# Patient Record
Sex: Female | Born: 1940 | Race: White | Hispanic: No | Smoking: Former smoker
Health system: Southern US, Community
[De-identification: ages and names within clinical notes are randomized; demographics above are authoritative.]

## PROBLEM LIST (undated history)

## (undated) DIAGNOSIS — M1990 Unspecified osteoarthritis, unspecified site: Secondary | ICD-10-CM

## (undated) DIAGNOSIS — S83104A Unspecified dislocation of right knee, initial encounter: Secondary | ICD-10-CM

## (undated) DIAGNOSIS — D649 Anemia, unspecified: Secondary | ICD-10-CM

## (undated) DIAGNOSIS — I609 Nontraumatic subarachnoid hemorrhage, unspecified: Secondary | ICD-10-CM

## (undated) DIAGNOSIS — R519 Headache, unspecified: Secondary | ICD-10-CM

## (undated) DIAGNOSIS — J189 Pneumonia, unspecified organism: Secondary | ICD-10-CM

## (undated) DIAGNOSIS — S83241A Other tear of medial meniscus, current injury, right knee, initial encounter: Secondary | ICD-10-CM

## (undated) DIAGNOSIS — R51 Headache: Secondary | ICD-10-CM

## (undated) DIAGNOSIS — I493 Ventricular premature depolarization: Secondary | ICD-10-CM

## (undated) DIAGNOSIS — I639 Cerebral infarction, unspecified: Secondary | ICD-10-CM

## (undated) DIAGNOSIS — E039 Hypothyroidism, unspecified: Secondary | ICD-10-CM

## (undated) HISTORY — PX: WISDOM TOOTH EXTRACTION: SHX21

## (undated) HISTORY — PX: MULTIPLE TOOTH EXTRACTIONS: SHX2053

## (undated) HISTORY — PX: BACK SURGERY: SHX140

## (undated) HISTORY — PX: SHOULDER SURGERY: SHX246

---

## 1968-11-01 HISTORY — PX: DILATION AND CURETTAGE OF UTERUS: SHX78

## 1968-11-01 HISTORY — PX: TONSILLECTOMY: SUR1361

## 1969-11-01 HISTORY — PX: VAGINAL HYSTERECTOMY: SUR661

## 1984-11-01 HISTORY — PX: CERVICAL LAMINECTOMY: SHX94

## 1991-11-02 HISTORY — PX: LUMBAR LAMINECTOMY: SHX95

## 1991-11-02 HISTORY — PX: ELBOW SURGERY: SHX618

## 1999-07-30 ENCOUNTER — Emergency Department (HOSPITAL_COMMUNITY): Admission: EM | Admit: 1999-07-30 | Discharge: 1999-07-30 | Payer: Self-pay | Admitting: Emergency Medicine

## 1999-07-30 ENCOUNTER — Encounter: Payer: Self-pay | Admitting: Emergency Medicine

## 2008-01-23 ENCOUNTER — Ambulatory Visit: Payer: Self-pay | Admitting: Gastroenterology

## 2008-01-23 LAB — CONVERTED CEMR LAB
ALT: 23 units/L (ref 0–35)
AST: 17 units/L (ref 0–37)
Albumin: 4 g/dL (ref 3.5–5.2)
Alkaline Phosphatase: 47 units/L (ref 39–117)
BUN: 6 mg/dL (ref 6–23)
Basophils Absolute: 0 10*3/uL (ref 0.0–0.1)
Basophils Relative: 0.8 % (ref 0.0–1.0)
Bilirubin, Direct: 0.1 mg/dL (ref 0.0–0.3)
CO2: 33 meq/L — ABNORMAL HIGH (ref 19–32)
Calcium: 9.4 mg/dL (ref 8.4–10.5)
Chloride: 103 meq/L (ref 96–112)
Creatinine, Ser: 0.8 mg/dL (ref 0.4–1.2)
Eosinophils Absolute: 0.1 10*3/uL (ref 0.0–0.6)
Eosinophils Relative: 2.7 % (ref 0.0–5.0)
GFR calc Af Amer: 92 mL/min
GFR calc non Af Amer: 76 mL/min
Glucose, Bld: 98 mg/dL (ref 70–99)
HCT: 41.8 % (ref 36.0–46.0)
Hemoglobin: 13.8 g/dL (ref 12.0–15.0)
Lymphocytes Relative: 31.7 % (ref 12.0–46.0)
MCHC: 32.9 g/dL (ref 30.0–36.0)
MCV: 88.8 fL (ref 78.0–100.0)
Monocytes Absolute: 0.3 10*3/uL (ref 0.2–0.7)
Monocytes Relative: 7.7 % (ref 3.0–11.0)
Neutro Abs: 2.5 10*3/uL (ref 1.4–7.7)
Neutrophils Relative %: 57.1 % (ref 43.0–77.0)
Platelets: 248 10*3/uL (ref 150–400)
Potassium: 4.4 meq/L (ref 3.5–5.1)
RBC: 4.7 M/uL (ref 3.87–5.11)
RDW: 12.7 % (ref 11.5–14.6)
Sed Rate: 15 mm/hr (ref 0–25)
Sodium: 141 meq/L (ref 135–145)
TSH: 2.36 microintl units/mL (ref 0.35–5.50)
Tissue Transglutaminase Ab, IgA: 0.2 units (ref <7)
Total Bilirubin: 0.5 mg/dL (ref 0.3–1.2)
Total Protein: 6.4 g/dL (ref 6.0–8.3)
WBC: 4.3 10*3/uL — ABNORMAL LOW (ref 4.5–10.5)

## 2008-01-24 ENCOUNTER — Encounter: Payer: Self-pay | Admitting: Gastroenterology

## 2008-02-02 ENCOUNTER — Ambulatory Visit: Payer: Self-pay | Admitting: Gastroenterology

## 2008-02-02 ENCOUNTER — Encounter: Payer: Self-pay | Admitting: Gastroenterology

## 2011-03-16 NOTE — Assessment & Plan Note (Signed)
[facility]                         GASTROENTEROLOGY OFFICE NOTE   NAME:Longley, CHRISSI CROW                MRN:          [id number]  DATE:01/23/2008                            DOB:          October 06, 1941    Mrs. 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  She is referred through the courtesy of  Dr. Foy Guadalajara for evaluation of four weeks of crampy abdominal pain and  diarrhea.   Mrs. Kishi has a long history of irritable bowel syndrome, diarrhea  predominant.  She really had been doing fairly well until the last month  when she has had 5 to 6 liters of watery nonbloody bowel movements today  with crampy abdominal pain, urgency, nocturnal diarrhea.  She denies any  recent antibiotic use, travel, or no other new medications, or  infectious disease exposure or sick family members at home.  Her  appetite is good and weight is stable, and she denies any specific food  intolerances.  She has seen Dr. Foy Guadalajara and had a normal CBC and  electrolyte panel, negative H. Pylori antibody titer, and preliminary  stool level and parasite is negative.   Apparently, this patient had colonoscopy in 1995, but I do not have this  report for review.  She currently denies rectal bleeding, fever, chills,  or other systemic complaints, any upper GI or hepatobiliary problems.  She has been using p.r.n. Imodium with mild response, also p.r.n.  dicyclomine 10 mg.   PAST MEDICAL HISTORY:  Otherwise noncontributory, suppurative  degenerative arthritis, and previous hysterectomy.   MEDICATIONS:  Imodium and dicyclomine.  She denies drug allergies.   FAMILY HISTORY:  Noncontributory.   There is no family history of colon cancer and polyps.  The risks of  diabetes and atherosclerosis in her father and brother.   SOCIAL HISTORY:  She is widowed and lives with her mother.  She has a  twelfth grade education.  She smokes one pack  of cigarettes per day but  denies ethanol abuse or dependency.   REVIEW OF SYSTEMS:  Otherwise noncontributory without any current  cardiovascular, pulmonary, genitourinary, neurologic, orthopedic,  endocrine, dermatologic or neuropsychiatric difficulties.  The patient  has had previous hemorrhoidectomy by Dr. Terri Piedra  and also has had a previous hysterectomy.   She is a healthy-appearing white  female in no acute distress.  She appeared her stated age.  She is 5'4 and weighs 162 pounds.  Blood  pressure 120/68.  Pulse was 76 and regular.  I could not appreciate stigmata of chronic liver disease or thyromegaly.  Chest was clear, and she was in regular rhythm without murmurs, gallops  or rubs.  There was no organomegaly, abdominal masses or tenderness.  Bowel sounds  were entirely normal.  Peripheral extremities were unremarkable.  Inspection of the rectum was unremarkable as was rectal exam.  There was  soft stool present, and it was guaiac negative.   ASSESSMENT:  Mrs. Lauf most likely is having a flair of irritable  bowel syndrome for a past history and rather negative examination.  Her  stool ova and parasite is still  pending, and she did not have a stool  culture performed which I will order.  It is unlikely that she has  underlying inflammatory bowel disease, but this is certainly a  possibility.   RECOMMENDATIONS:  1. Stool routine culture and sensitivity.  2. Check repeat CBC, sed rate, and liver profile.  3. Low fiber diet as tolerated.  4. Continue p.r.n. Imodium.  Will have her empirically placed on      Lialda 1.2 g 2 tablets q.a.m.  5. Screening colonoscopy at her convenience.     Vania Rea. Jarold Motto, MD, Caleen Essex, FAGA  Electronically Signed    DRP/MedQ  DD: 01/23/2008  DT: 01/23/2008  Job #: [id number]   cc:   Molly Maduro L. Foy Guadalajara, M.D.  Joycelyn Rua, M.D.

## 2014-08-07 ENCOUNTER — Encounter: Payer: Self-pay | Admitting: Gastroenterology

## 2014-11-01 DIAGNOSIS — I609 Nontraumatic subarachnoid hemorrhage, unspecified: Secondary | ICD-10-CM

## 2014-11-01 HISTORY — DX: Nontraumatic subarachnoid hemorrhage, unspecified: I60.9

## 2015-09-24 ENCOUNTER — Encounter: Payer: Self-pay | Admitting: *Deleted

## 2015-09-24 ENCOUNTER — Emergency Department (INDEPENDENT_AMBULATORY_CARE_PROVIDER_SITE_OTHER)
Admission: EM | Admit: 2015-09-24 | Discharge: 2015-09-24 | Disposition: A | Discharge status: Home / Self Care | Payer: Medicare Other | Source: Home / Self Care | Attending: Family Medicine | Admitting: Family Medicine

## 2015-09-24 ENCOUNTER — Emergency Department (INDEPENDENT_AMBULATORY_CARE_PROVIDER_SITE_OTHER): Payer: PRIVATE HEALTH INSURANCE

## 2015-09-24 DIAGNOSIS — Z23 Encounter for immunization: Secondary | ICD-10-CM

## 2015-09-24 DIAGNOSIS — S00412A Abrasion of left ear, initial encounter: Secondary | ICD-10-CM

## 2015-09-24 DIAGNOSIS — M25472 Effusion, left ankle: Secondary | ICD-10-CM

## 2015-09-24 DIAGNOSIS — S93402A Sprain of unspecified ligament of left ankle, initial encounter: Secondary | ICD-10-CM | POA: Diagnosis not present

## 2015-09-24 HISTORY — DX: Unspecified osteoarthritis, unspecified site: M19.90

## 2015-09-24 IMAGING — CR DG ANKLE COMPLETE 3+V*L*
3 series · 3 of 3 positions shown · non-contrast
Comparison: None.

CLINICAL DATA: Lateral ankle pain and swelling after falling out of
bed this morning, initial encounter.

EXAM:
LEFT ANKLE COMPLETE - 3+ VIEW

[ankle ap]
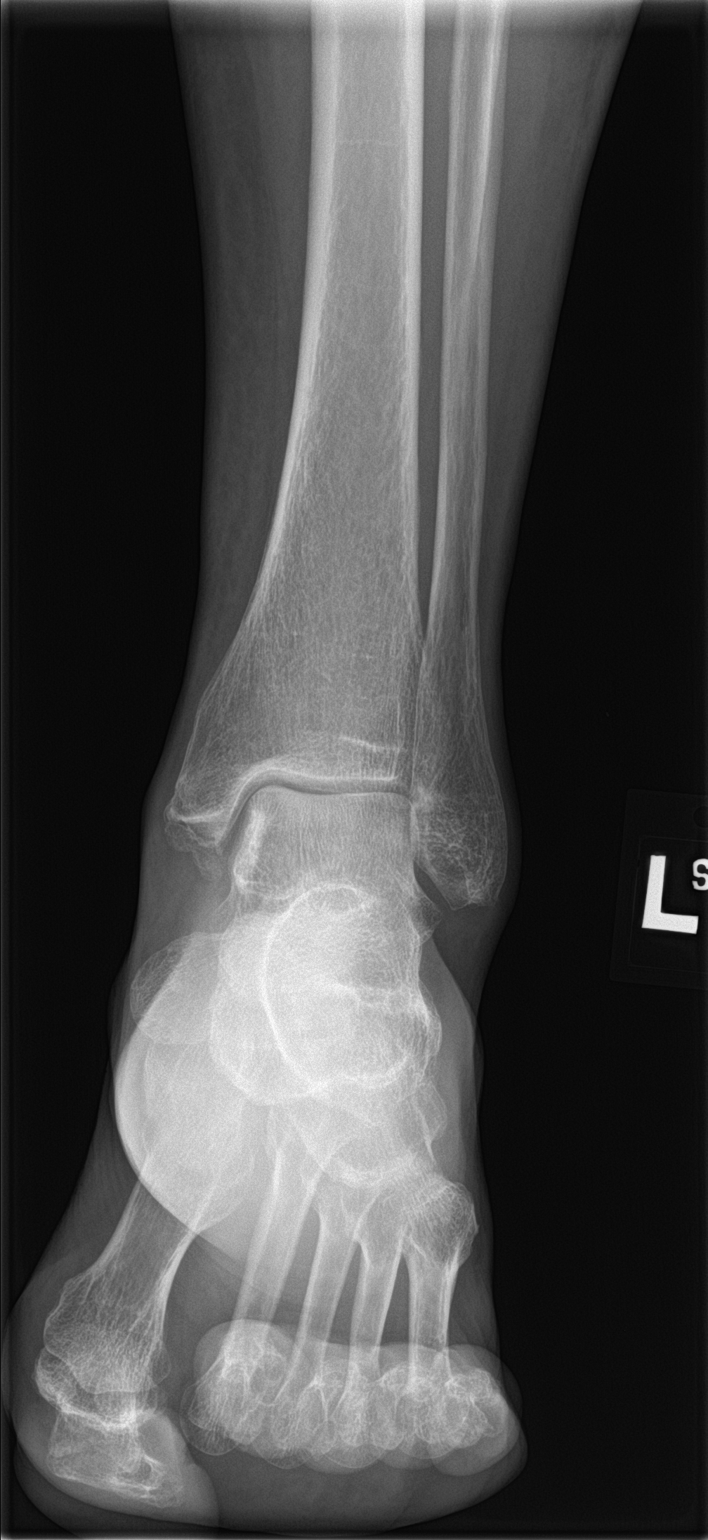

[ankle obl]
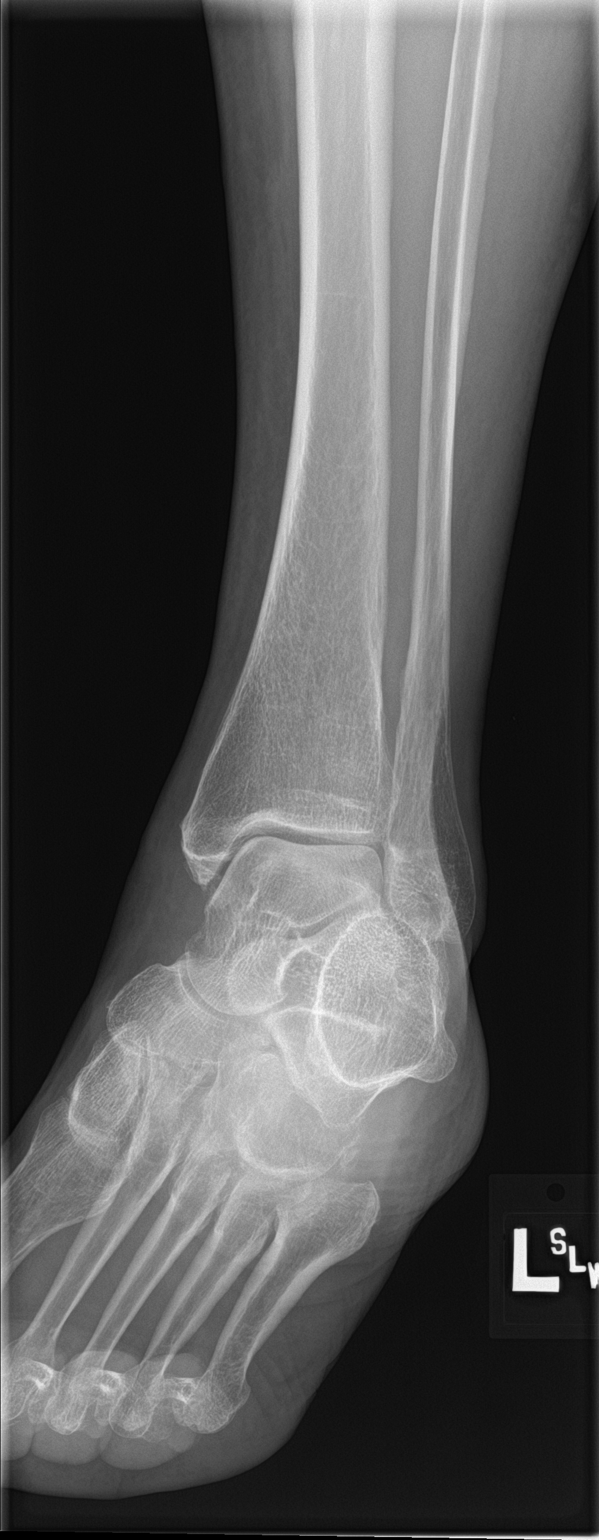

[ankle lat]
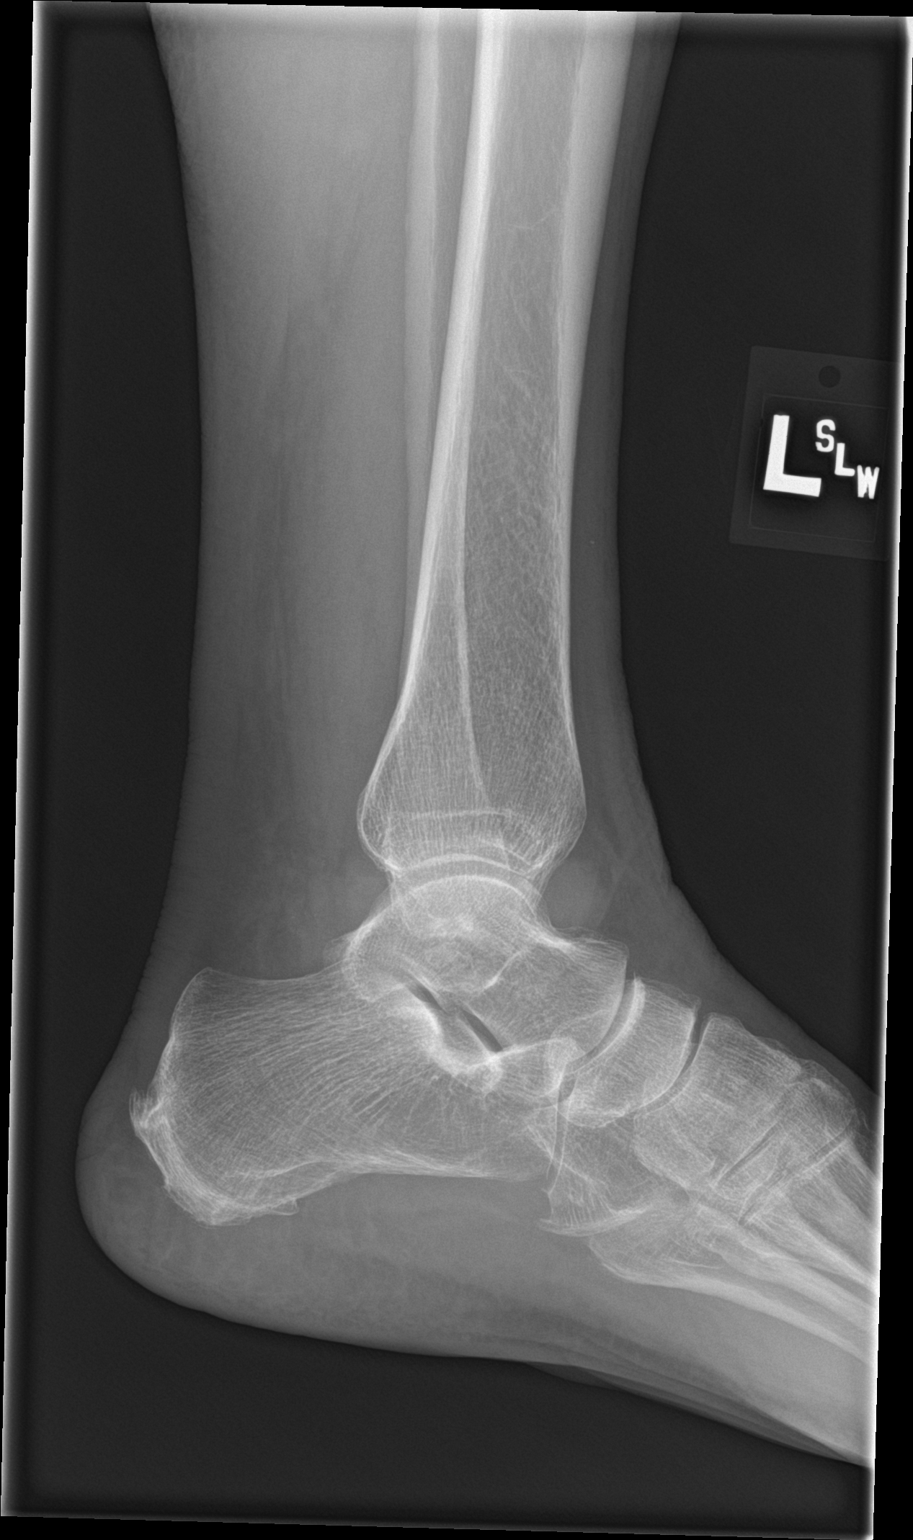

[3 of 3 positions shown; findings below may reference images not displayed]

FINDINGS: There is a joint effusion. No definite acute fracture. Possible old
medial malleolar avulsion fracture. Calcaneal spurs.
IMPRESSION: Joint effusion.  No definite fracture.

## 2015-09-24 MED ORDER — ACETAMINOPHEN 325 MG PO TABS
975.0000 mg | ORAL_TABLET | Freq: Once | ORAL | Status: AC
  Administered 2015-09-24: 975 mg via ORAL

## 2015-09-24 MED ORDER — TETANUS-DIPHTH-ACELL PERTUSSIS 5-2.5-18.5 LF-MCG/0.5 IM SUSP
0.5000 mL | Freq: Once | INTRAMUSCULAR | Status: AC
  Administered 2015-09-24: 0.5 mL via INTRAMUSCULAR

## 2015-09-24 MED ORDER — HYDROCODONE-ACETAMINOPHEN 5-325 MG PO TABS: 1.0000 | ORAL_TABLET | Freq: Four times a day (QID) | ORAL | Status: DC | PRN

## 2015-09-24 MED ORDER — MUPIROCIN 2 % EX OINT: 1.0000 "application " | TOPICAL_OINTMENT | Freq: Three times a day (TID) | CUTANEOUS | Status: DC

## 2015-09-24 NOTE — Discharge Instructions (Signed)
Apply ice pack for 30 minutes every 1 to 2 hours today and tomorrow.  Elevate.  Use walker or cane until evaluated by your orthopedist.  Wear Ace wrap until swelling decreases.  Wear brace for about 2 to 3 weeks.     Return for follow-up if left ear becomes increasingly red, painful, swollen.   Ankle Sprain An ankle sprain is an injury to the strong, fibrous tissues (ligaments) that hold the bones of your ankle joint together.  CAUSES An ankle sprain is usually caused by a fall or by twisting your ankle. Ankle sprains most commonly occur when you step on the outer edge of your foot, and your ankle turns inward. People who participate in sports are more prone to these types of injuries.  SYMPTOMS   Pain in your ankle. The pain may be present at rest or only when you are trying to stand or walk.  Swelling.  Bruising. Bruising may develop immediately or within 1 to 2 days after your injury.  Difficulty standing or walking, particularly when turning corners or changing directions. DIAGNOSIS  Your caregiver will ask you details about your injury and perform a physical exam of your ankle to determine if you have an ankle sprain. During the physical exam, your caregiver will press on and apply pressure to specific areas of your foot and ankle. Your caregiver will try to move your ankle in certain ways. An X-ray exam may be done to be sure a bone was not broken or a ligament did not separate from one of the bones in your ankle (avulsion fracture).  TREATMENT  Certain types of braces can help stabilize your ankle. Your caregiver can make a recommendation for this. Your caregiver may recommend the use of medicine for pain. If your sprain is severe, your caregiver may refer you to a surgeon who helps to restore function to parts of your skeletal system (orthopedist) or a physical therapist. HOME CARE INSTRUCTIONS   Apply ice to your injury for 1-2 days or as directed by your caregiver. Applying ice  helps to reduce inflammation and pain.  Put ice in a plastic bag.  Place a towel between your skin and the bag.  Leave the ice on for 15-20 minutes at a time, every 2 hours while you are awake.  Only take over-the-counter or prescription medicines for pain, discomfort, or fever as directed by your caregiver.  Elevate your injured ankle above the level of your heart as much as possible for 2-3 days.  If your caregiver recommends crutches, use them as instructed. Gradually put weight on the affected ankle. Continue to use crutches or a cane until you can walk without feeling pain in your ankle.  If you have a plaster splint, wear the splint as directed by your caregiver. Do not rest it on anything harder than a pillow for the first 24 hours. Do not put weight on it. Do not get it wet. You may take it off to take a shower or bath.  You may have been given an elastic bandage to wear around your ankle to provide support. If the elastic bandage is too tight (you have numbness or tingling in your foot or your foot becomes cold and blue), adjust the bandage to make it comfortable.  If you have an air splint, you may blow more air into it or let air out to make it more comfortable. You may take your splint off at night and before taking a shower or bath.  Wiggle your toes in the splint several times per day to decrease swelling. SEEK MEDICAL CARE IF:   You have rapidly increasing bruising or swelling.  Your toes feel extremely cold or you lose feeling in your foot.  Your pain is not relieved with medicine. SEEK IMMEDIATE MEDICAL CARE IF:  Your toes are numb or blue.  You have severe pain that is increasing. MAKE SURE YOU:   Understand these instructions.  Will watch your condition.  Will get help right away if you are not doing well or get worse.   This information is not intended to replace advice given to you by your health care provider. Make sure you discuss any questions you have  with your health care provider.   Document Released: 10/18/2005 Document Revised: 11/08/2014 Document Reviewed: 10/30/2011 Elsevier Interactive Patient Education [employer]2016 [employer] Inc.

## 2015-09-24 NOTE — ED Provider Notes (Signed)
CSN: [id number]     Arrival date & time 09/24/15  0930 History   First MD Initiated Contact with Patient 09/24/15 1038     Chief Complaint  Patient presents with  . Ear Laceration  . Ankle Pain      HPI Comments: Patient reports that she fell out of bed at 8am today, injuring her left ear and left ankle.  No loss of consciousness.  No other injury.  She does not remember her last Tdap.  The history is provided by the patient.    Past Medical History  Diagnosis Date  . Arthritis    Past Surgical History  Procedure Laterality Date  . Tonsillectomy    . Abdominal hysterectomy    . Dilation and curettage of uterus    . Wisdom tooth extraction    . Cervical laminectomy    . Lumbar laminectomy    . Back surgery    . Elbow surgery Right   . Shoulder surgery Left    Family History  Problem Relation Age of Onset  . Stroke Mother   . Heart attack Father    Social History  Substance Use Topics  . Smoking status: Current Every Day Smoker -- 1.00 packs/day    Types: Cigarettes  . Smokeless tobacco: None  . Alcohol Use: No   OB History    No data available     Review of Systems  Constitutional: Negative.   HENT: Positive for ear pain. Negative for facial swelling, hearing loss, nosebleeds and trouble swallowing.        Laceration/abrasion left ear  Eyes: Negative.   Respiratory: Negative.   Cardiovascular: Negative.   Gastrointestinal: Negative.   Genitourinary: Negative.   Musculoskeletal: Positive for joint swelling.  Skin: Negative.   Neurological: Negative for dizziness, syncope, speech difficulty, weakness, light-headedness, numbness and headaches.    Allergies  Aspirin and Penicillins  Home Medications   Prior to Admission medications   Medication Sig Start Date End Date Taking? Authorizing Provider  acetaminophen (TYLENOL) 325 MG tablet Take 650 mg by mouth as needed.   Yes Historical Provider, MD  diphenhydrAMINE (BENADRYL) 25 MG tablet Take 25 mg by  mouth every 6 (six) hours as needed.   Yes Historical Provider, MD  Fish Oil-Cholecalciferol (FISH OIL + D3 PO) Take by mouth.   Yes Historical Provider, MD  glucosamine-chondroitin 500-400 MG tablet Take 1 tablet by mouth 3 (three) times daily.   Yes Historical Provider, MD  VITAMIN D, ERGOCALCIFEROL, PO Take by mouth.   Yes Historical Provider, MD  HYDROcodone-acetaminophen (NORCO/VICODIN) 5-325 MG tablet Take 1 tablet by mouth every 6 (six) hours as needed for moderate pain. 09/24/15   Lattie Haw, MD  mupirocin ointment (BACTROBAN) 2 % Apply 1 application topically 3 (three) times daily. 09/24/15   Lattie Haw, MD   Meds Ordered and Administered this Visit   Medications  Tdap (BOOSTRIX) injection 0.5 mL (not administered)  acetaminophen (TYLENOL) tablet 975 mg (975 mg Oral Given 09/24/15 0928)    BP 189/75 mmHg  Pulse 63  Temp(Src) 97.8 F (36.6 C) (Oral)  Resp 16  Ht  (1.626 m)  Wt 168 lb (76.204 kg)  BMI 28.82 kg/m2  SpO2 97% No data found.   Physical Exam  Constitutional: She is oriented to person, place, and time. She appears well-developed and well-nourished. No distress.  HENT:  Right Ear: Tympanic membrane, external ear and ear canal normal.  Left Ear: Tympanic membrane and ear canal  normal. Left ear exhibits lacerations.  Ears:  Nose: Nose normal.  Mouth/Throat: Oropharynx is clear and moist.  Superior aspect of left helix has a 3mm long minimal superficial linear abrasion as noted on diagram.  No swelling; minimal tenderness to palpation.    Eyes: Conjunctivae and EOM are normal. Pupils are equal, round, and reactive to light.  Neck: Normal range of motion.  Musculoskeletal:       Left ankle: She exhibits decreased range of motion and swelling. She exhibits no ecchymosis, no deformity, no laceration and normal pulse. Tenderness. Lateral malleolus and AITFL tenderness found. No medial malleolus tenderness found.       Feet:  Left ankle:  Decreased  range of motion.  Tenderness and swelling over the lateral malleolus.  Joint stable.  No tenderness over the base of the fifth metatarsal.  Distal neurovascular function is intact.   Neurological: She is alert and oriented to person, place, and time.  Skin: Skin is warm and dry.  Nursing note and vitals reviewed.   ED Course  Procedures  None  Imaging Review Dg Ankle Complete Left  09/24/2015  CLINICAL DATA:  Lateral ankle pain and swelling after falling out of bed this morning, initial encounter. EXAM: LEFT ANKLE COMPLETE - 3+ VIEW COMPARISON:  None. FINDINGS: There is a joint effusion. No definite acute fracture. Possible old medial malleolar avulsion fracture. Calcaneal spurs. IMPRESSION: Joint effusion.  No definite fracture. Electronically Signed   By: Leanna BattlesMelinda  Blietz M.D.   On: 09/24/2015 10:17     MDM   1. Abrasion of ear, left, initial encounter   2. Left ankle sprain, initial encounter    Tdap administered Small abrasion left external ear cleansed with HibiClens and saline.  Bacitracin ointment applied. Rx for Bactroban ointment TID until healed. With ankle joint effusion present, concern for occult fracture.  Prefer that patient be non-weight bearing but she states that she cannot use crutches.  She does have a walker and cane available at home. Ace wrap applied followed by AirCast splint. Rx written for Lortab. Apply ice pack for 30 minutes every 1 to 2 hours today and tomorrow.  Elevate.    Advised to follow-up with her orthopedist Dr. Renae FicklePaul within one week, and use walker until her follow-up.  Return for follow-up if left ear becomes increasingly red, painful, swollen.    Lattie HawStephen A Beese, MD 09/24/15 [id number]

## 2015-09-24 NOTE — ED Notes (Signed)
Pt c/o LT ear laceration and LT ankle pain post fall while getting out of bed at 0800. Denies LOC.

## 2016-04-06 ENCOUNTER — Encounter: Payer: Self-pay | Admitting: Internal Medicine

## 2016-04-06 NOTE — Progress Notes (Signed)
This encounter was created in error - please disregard.

## 2017-03-28 ENCOUNTER — Encounter (HOSPITAL_BASED_OUTPATIENT_CLINIC_OR_DEPARTMENT_OTHER): Payer: Self-pay

## 2017-03-28 ENCOUNTER — Emergency Department (HOSPITAL_BASED_OUTPATIENT_CLINIC_OR_DEPARTMENT_OTHER): Payer: Medicare Other

## 2017-03-28 ENCOUNTER — Observation Stay (HOSPITAL_BASED_OUTPATIENT_CLINIC_OR_DEPARTMENT_OTHER)
Admission: EM | Admit: 2017-03-28 | Discharge: 2017-03-29 | Disposition: A | Discharge status: Home / Self Care | Payer: Medicare Other | Attending: Neurosurgery | Admitting: Neurosurgery

## 2017-03-28 DIAGNOSIS — S40012A Contusion of left shoulder, initial encounter: Secondary | ICD-10-CM

## 2017-03-28 DIAGNOSIS — M8580 Other specified disorders of bone density and structure, unspecified site: Secondary | ICD-10-CM | POA: Diagnosis not present

## 2017-03-28 DIAGNOSIS — M1990 Unspecified osteoarthritis, unspecified site: Secondary | ICD-10-CM | POA: Diagnosis not present

## 2017-03-28 DIAGNOSIS — F17210 Nicotine dependence, cigarettes, uncomplicated: Secondary | ICD-10-CM | POA: Diagnosis not present

## 2017-03-28 DIAGNOSIS — Y9389 Activity, other specified: Secondary | ICD-10-CM | POA: Diagnosis not present

## 2017-03-28 DIAGNOSIS — M25512 Pain in left shoulder: Secondary | ICD-10-CM | POA: Diagnosis not present

## 2017-03-28 DIAGNOSIS — G3189 Other specified degenerative diseases of nervous system: Secondary | ICD-10-CM | POA: Diagnosis not present

## 2017-03-28 DIAGNOSIS — S0990XA Unspecified injury of head, initial encounter: Secondary | ICD-10-CM

## 2017-03-28 DIAGNOSIS — Z880 Allergy status to penicillin: Secondary | ICD-10-CM | POA: Diagnosis not present

## 2017-03-28 DIAGNOSIS — Y998 Other external cause status: Secondary | ICD-10-CM | POA: Insufficient documentation

## 2017-03-28 DIAGNOSIS — W108XXA Fall (on) (from) other stairs and steps, initial encounter: Secondary | ICD-10-CM | POA: Diagnosis not present

## 2017-03-28 DIAGNOSIS — Y92007 Garden or yard of unspecified non-institutional (private) residence as the place of occurrence of the external cause: Secondary | ICD-10-CM | POA: Insufficient documentation

## 2017-03-28 DIAGNOSIS — M542 Cervicalgia: Secondary | ICD-10-CM | POA: Insufficient documentation

## 2017-03-28 DIAGNOSIS — E876 Hypokalemia: Secondary | ICD-10-CM | POA: Diagnosis not present

## 2017-03-28 DIAGNOSIS — Z79899 Other long term (current) drug therapy: Secondary | ICD-10-CM | POA: Diagnosis not present

## 2017-03-28 DIAGNOSIS — I609 Nontraumatic subarachnoid hemorrhage, unspecified: Secondary | ICD-10-CM

## 2017-03-28 DIAGNOSIS — S066X0A Traumatic subarachnoid hemorrhage without loss of consciousness, initial encounter: Principal | ICD-10-CM | POA: Insufficient documentation

## 2017-03-28 DIAGNOSIS — Z90710 Acquired absence of both cervix and uterus: Secondary | ICD-10-CM | POA: Diagnosis not present

## 2017-03-28 DIAGNOSIS — Z886 Allergy status to analgesic agent status: Secondary | ICD-10-CM | POA: Insufficient documentation

## 2017-03-28 DIAGNOSIS — W19XXXA Unspecified fall, initial encounter: Secondary | ICD-10-CM

## 2017-03-28 IMAGING — CT CT CERVICAL SPINE W/O CM
5 of 8 series · 15 of 33 positions shown, 16 images · non-contrast
Comparison: None.

CLINICAL DATA: 26-year-old female with fall

EXAM:
CT HEAD WITHOUT CONTRAST
CT CERVICAL SPINE WITHOUT CONTRAST
TECHNIQUE: Multidetector CT imaging of the head and cervical spine was
performed following the standard protocol without intravenous
contrast. Multiplanar CT image reconstructions of the cervical spine
were also generated.

[Series 3: head 2.0 h70h · axial · 0.42mm/px · z∈[-372,-322]mm · 2 of 75 slices shown]
[im 25/75  bone]
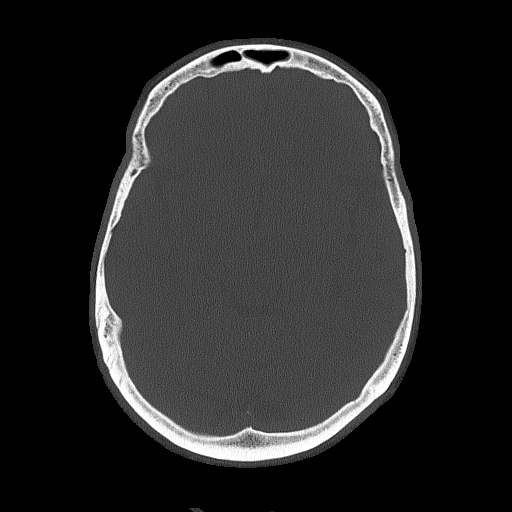
[im 50/75  bone]
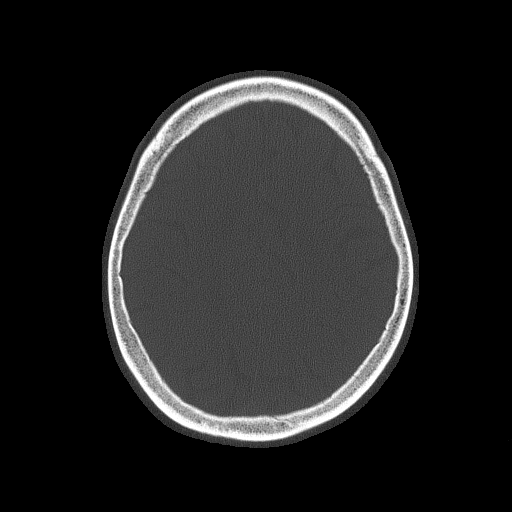

[Series 4: head 3.0 mpr cor · coronal · 0.29mm/px · 3 of 67 slices shown]
[im 17/67  bone]
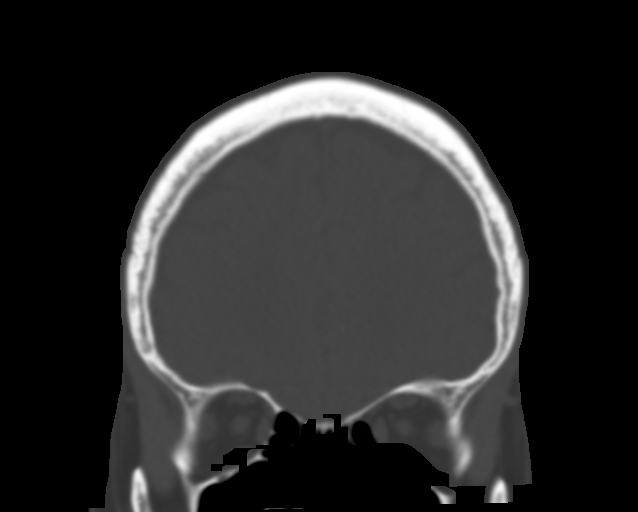
[im 34/67  bone]
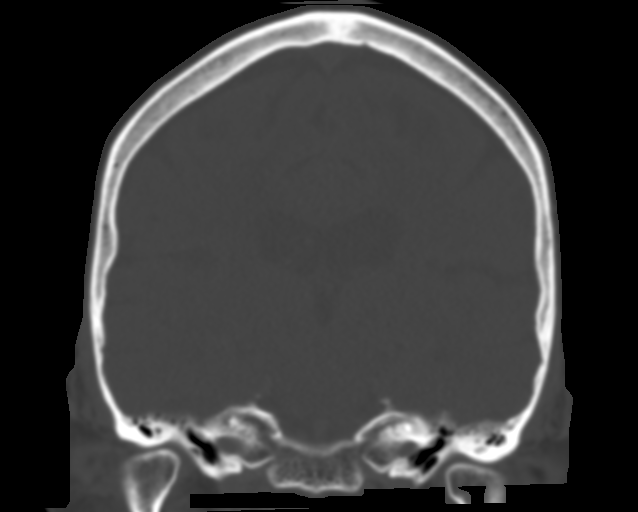
[im 50/67  bone]
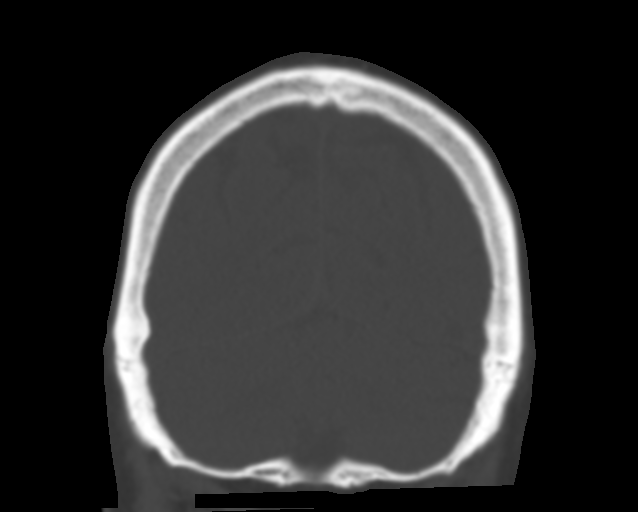

[Series 7: c_spine 2.0 i30s 3 · axial · 0.37mm/px · z∈[-502,-450]mm · 2 of 80 slices shown]
[im 27/80  bone]
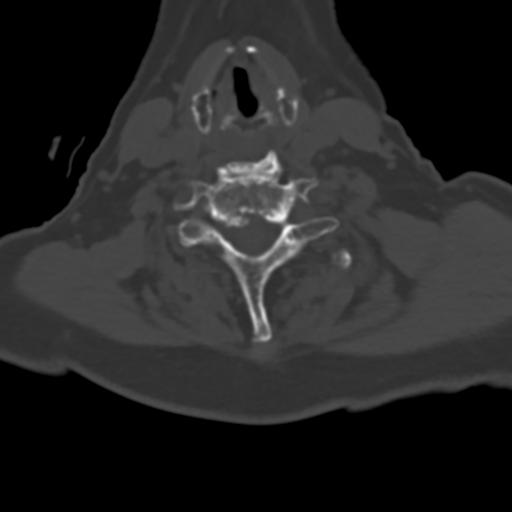
[im 53/80  bone]
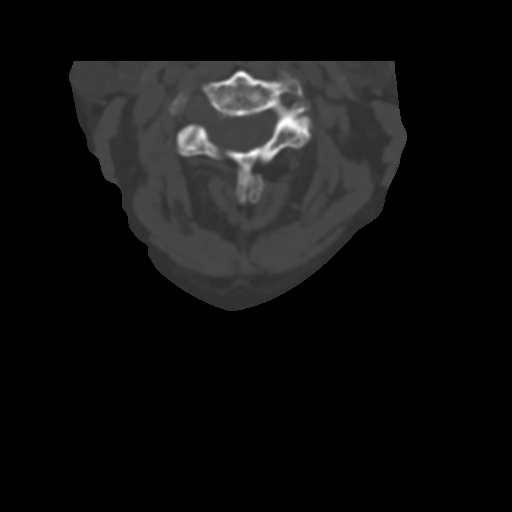

[Series 10: sagittals · sagittal · 0.31mm/px · 5 of 66 slices shown]
[im 11/66  bone]
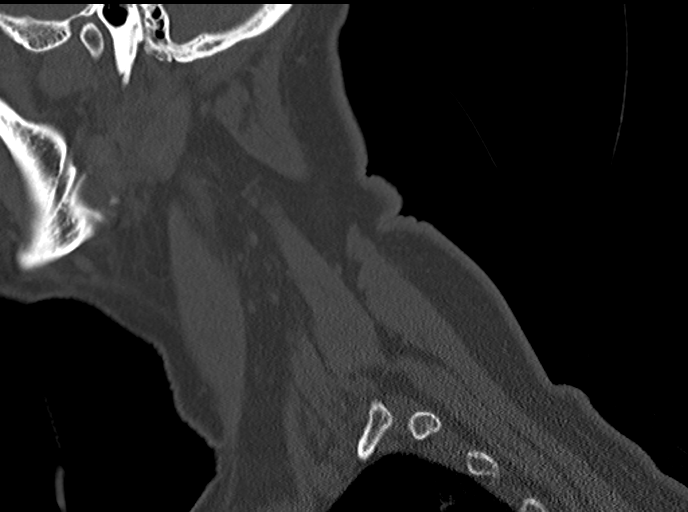
[im 22/66  bone]
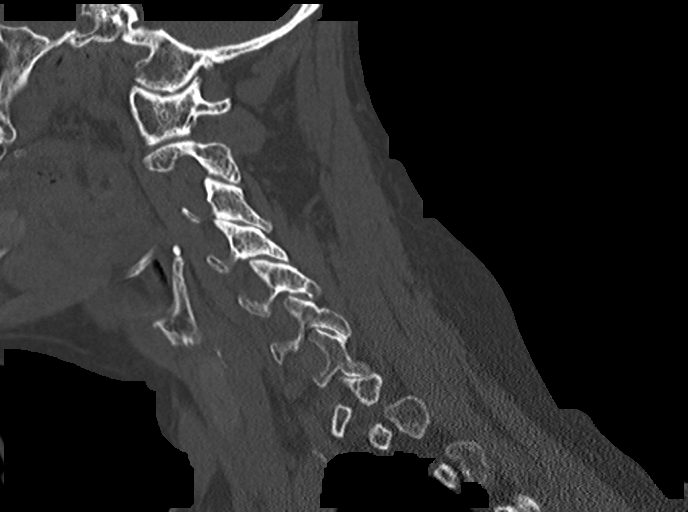
[im 33/66  bone]
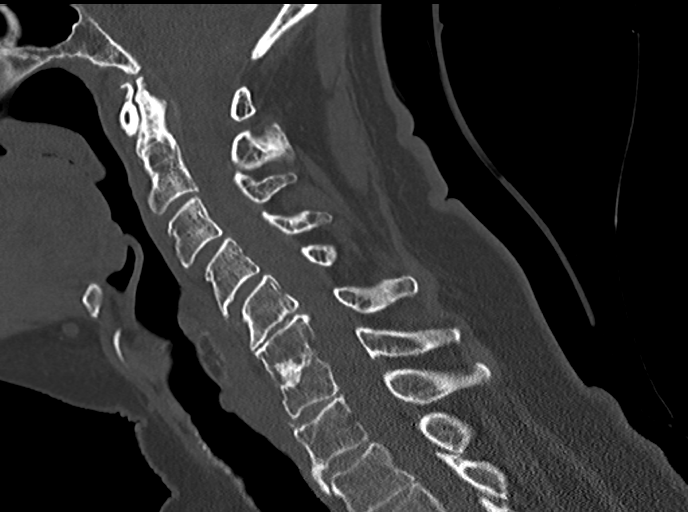
[im 44/66  bone]
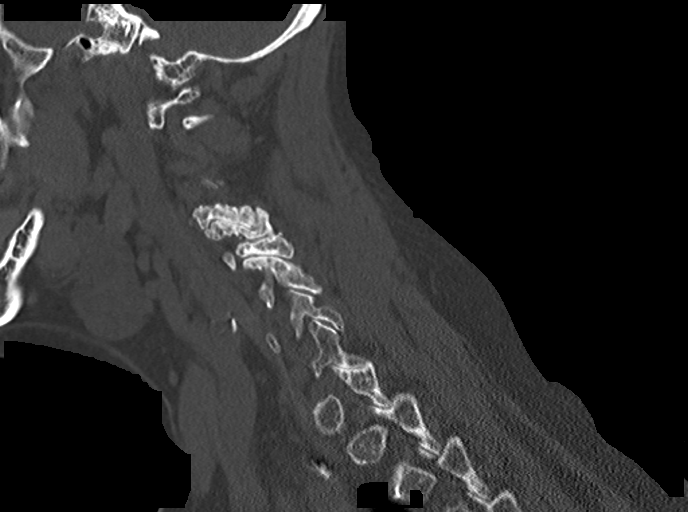
[im 55/66  bone]
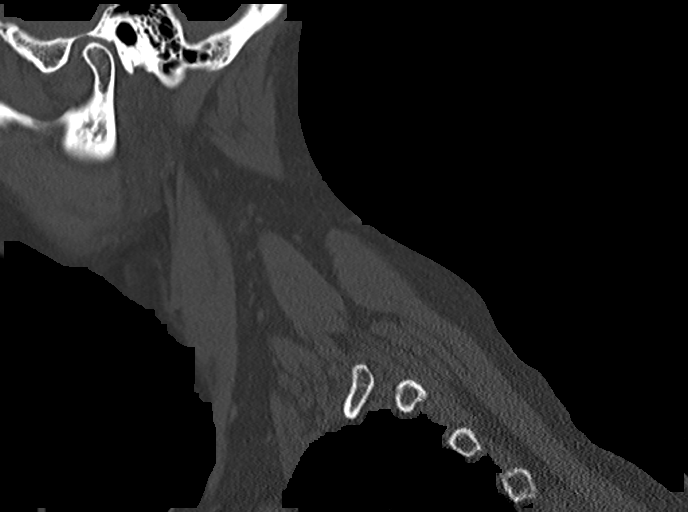

[Series 11: orthogonals · axial · 0.23mm/px · z∈[-544,-445]mm · 3 of 108 slices shown, 4 images]
[im 27/108  soft-tissue]
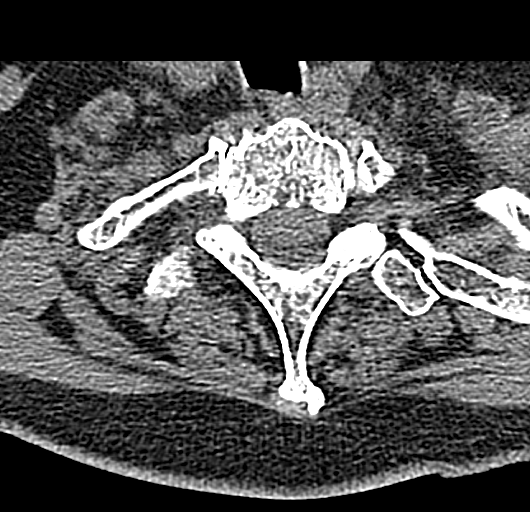
[im 27/108  bone]
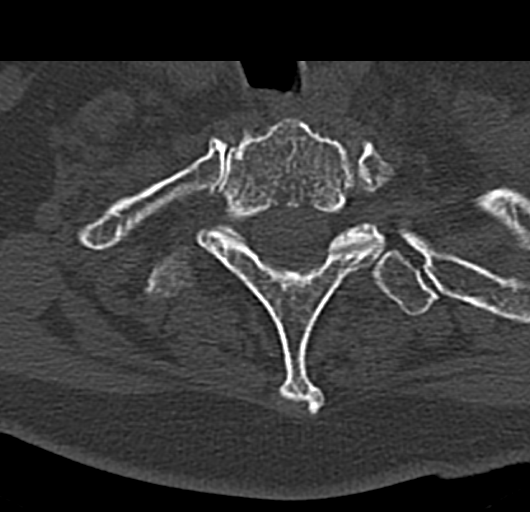
[im 54/108  bone]
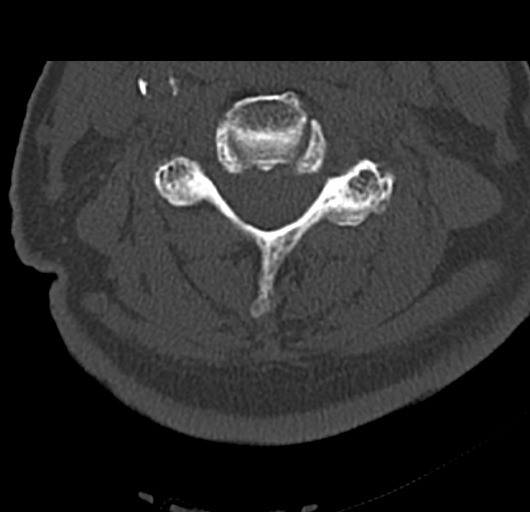
[im 81/108  bone]
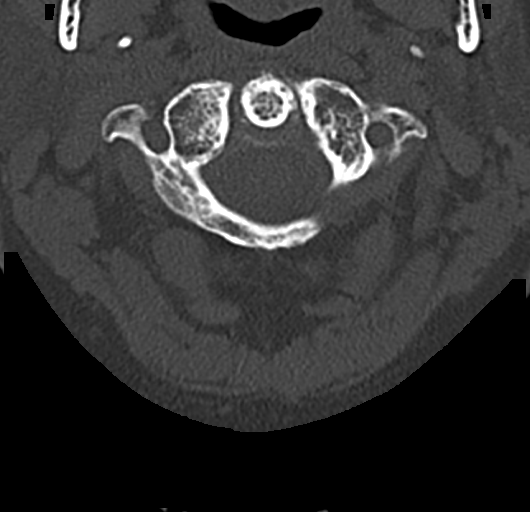

[15 of 33 positions shown; findings below may reference images not displayed]

FINDINGS: CT HEAD FINDINGS

Brain: There is moderate age-related atrophy and chronic
microvascular ischemic changes. A thin linear high attenuation
involving the right frontal lobe along the falx (series 2 image 20,
series 4, image 23, and series 5 image 26 is concerning for a small
subarachnoid hemorrhage. Close follow-up recommended. No other
intracranial hemorrhage identified. There is no mass effect or
midline shift.

Vascular: No hyperdense vessel or unexpected calcification.

Skull: Normal. Negative for fracture or focal lesion.

Sinuses/Orbits: Mild mucoperiosteal thickening of paranasal sinuses.
No air-fluid levels. The mastoid air cells are clear.

Other: None

CT CERVICAL SPINE FINDINGS

Alignment: No acute subluxation. Mild reversal of normal cervical
lordosis at C5-C6, likely related to degenerative changes.

Skull base and vertebrae: No acute fracture.  Osteopenia.

Soft tissues and spinal canal: No prevertebral fluid or swelling. No
visible canal hematoma.

Disc levels: Multilevel degenerative changes most prominent at C5-C6
and C6-C7 where there is disc space narrowing and endplate
irregularity.

Upper chest: Negative.

Other: Bilateral carotid bulb atherosclerotic plaques.
IMPRESSION: 1. Findings suspicious for a very small subarachnoid hemorrhage
involving the medial right frontal lobe along the falx. Close
follow-up recommended.
2. Moderate age-related atrophy and chronic microvascular ischemic
changes.
3. No acute/traumatic cervical spine pathology.
These results were called by telephone at the time of interpretation
on [DATE] at [DATE] to Dr. HAMELJI , who verbally
acknowledged these results.

## 2017-03-28 IMAGING — DX DG SHOULDER 2+V*L*
3 series · 3 of 3 positions shown · non-contrast
Comparison: None.

CLINICAL DATA: Fell 2 hours ago, pain.

EXAM:
LEFT SHOULDER - 2+ VIEW

[shoulder grashey]
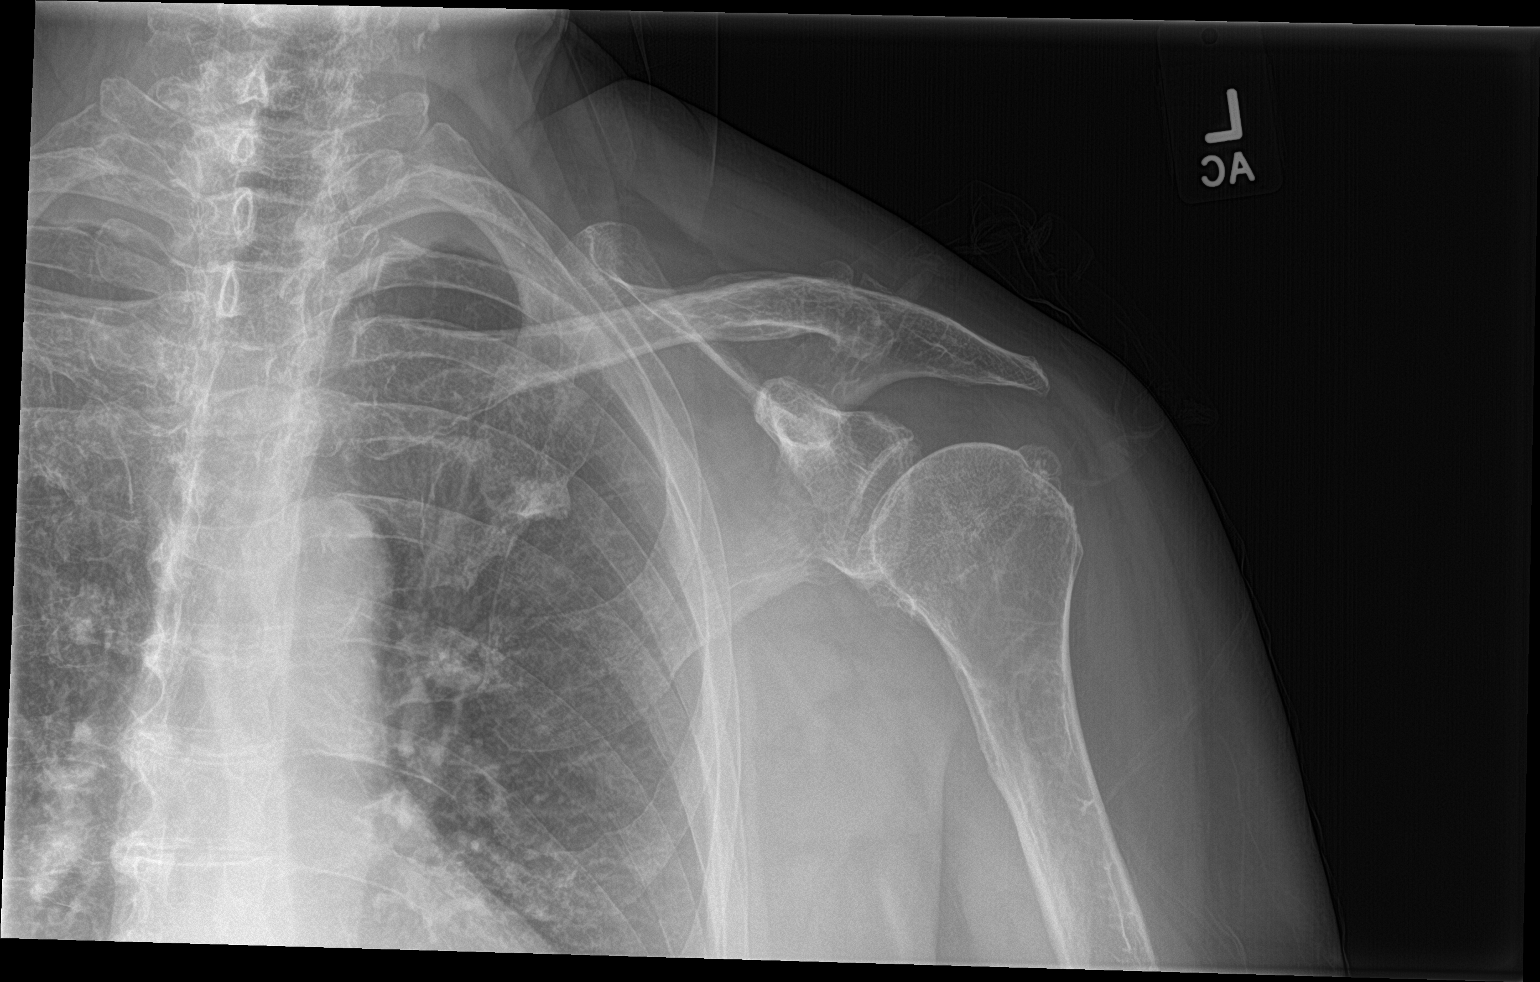

[shoulder y view]
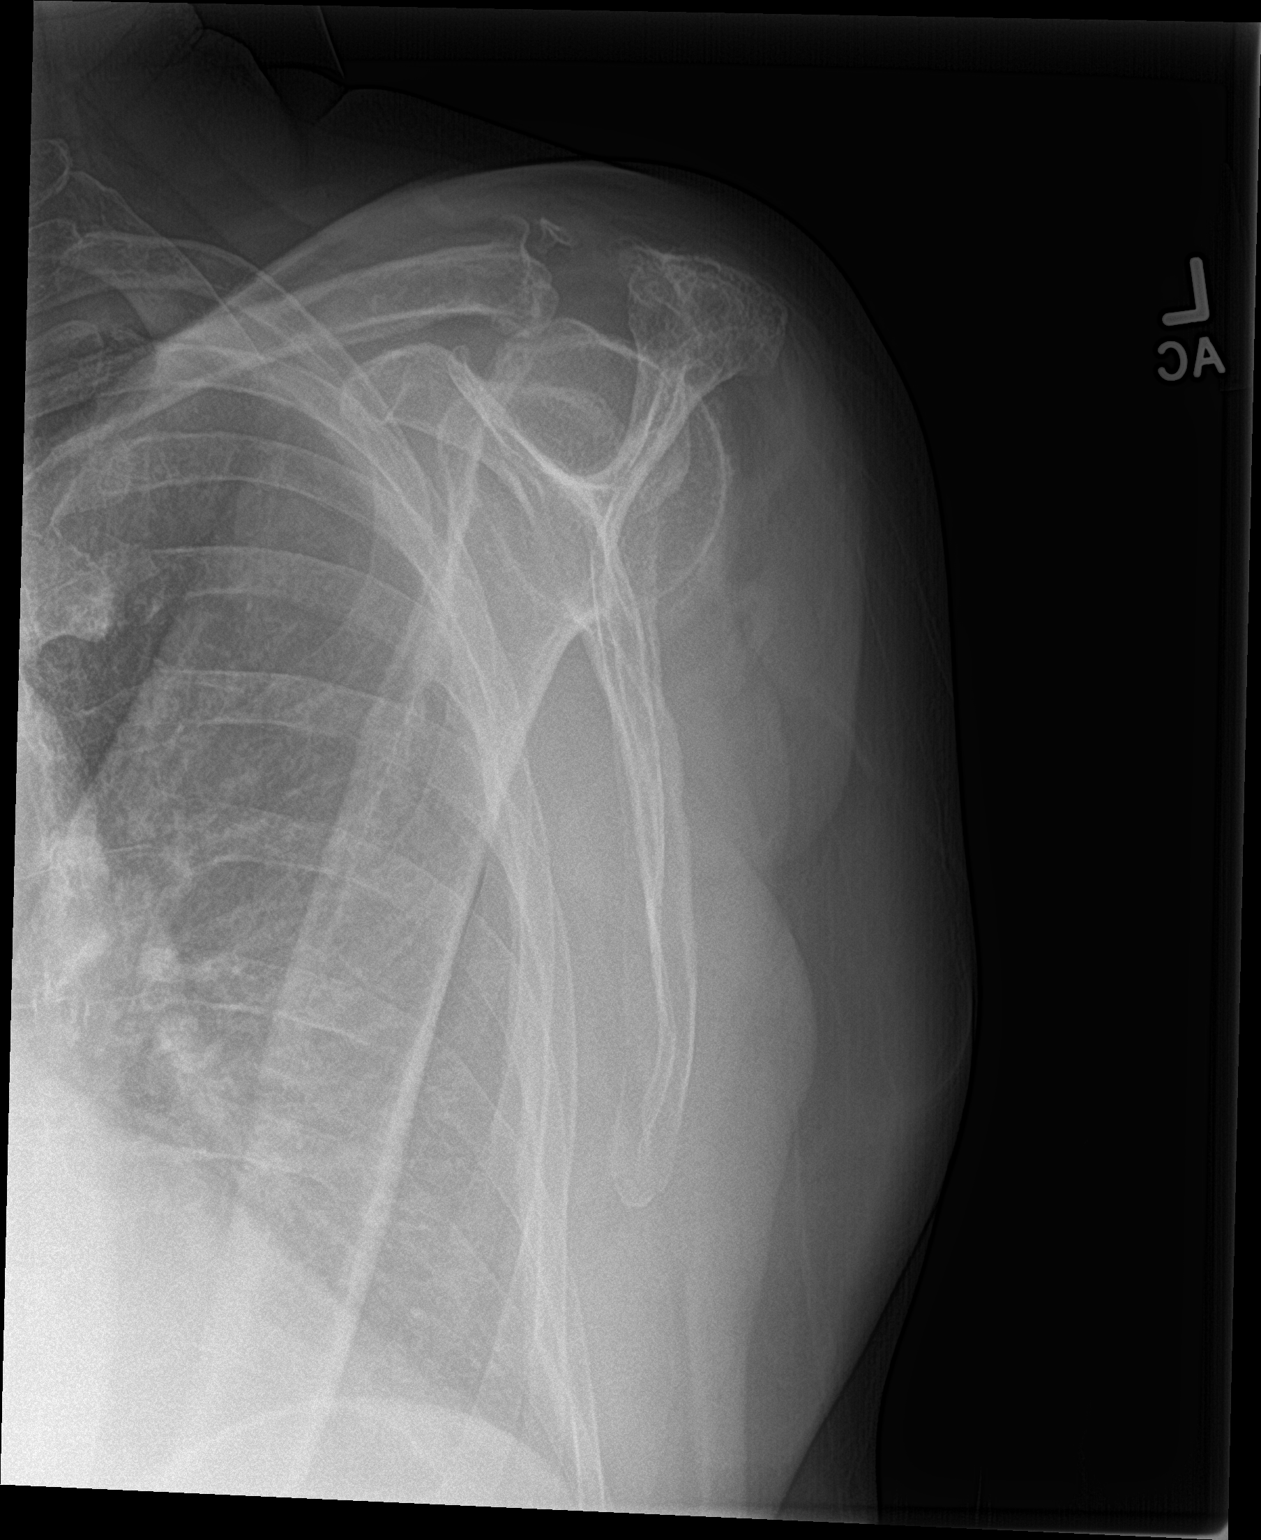

[shoulder axillary]
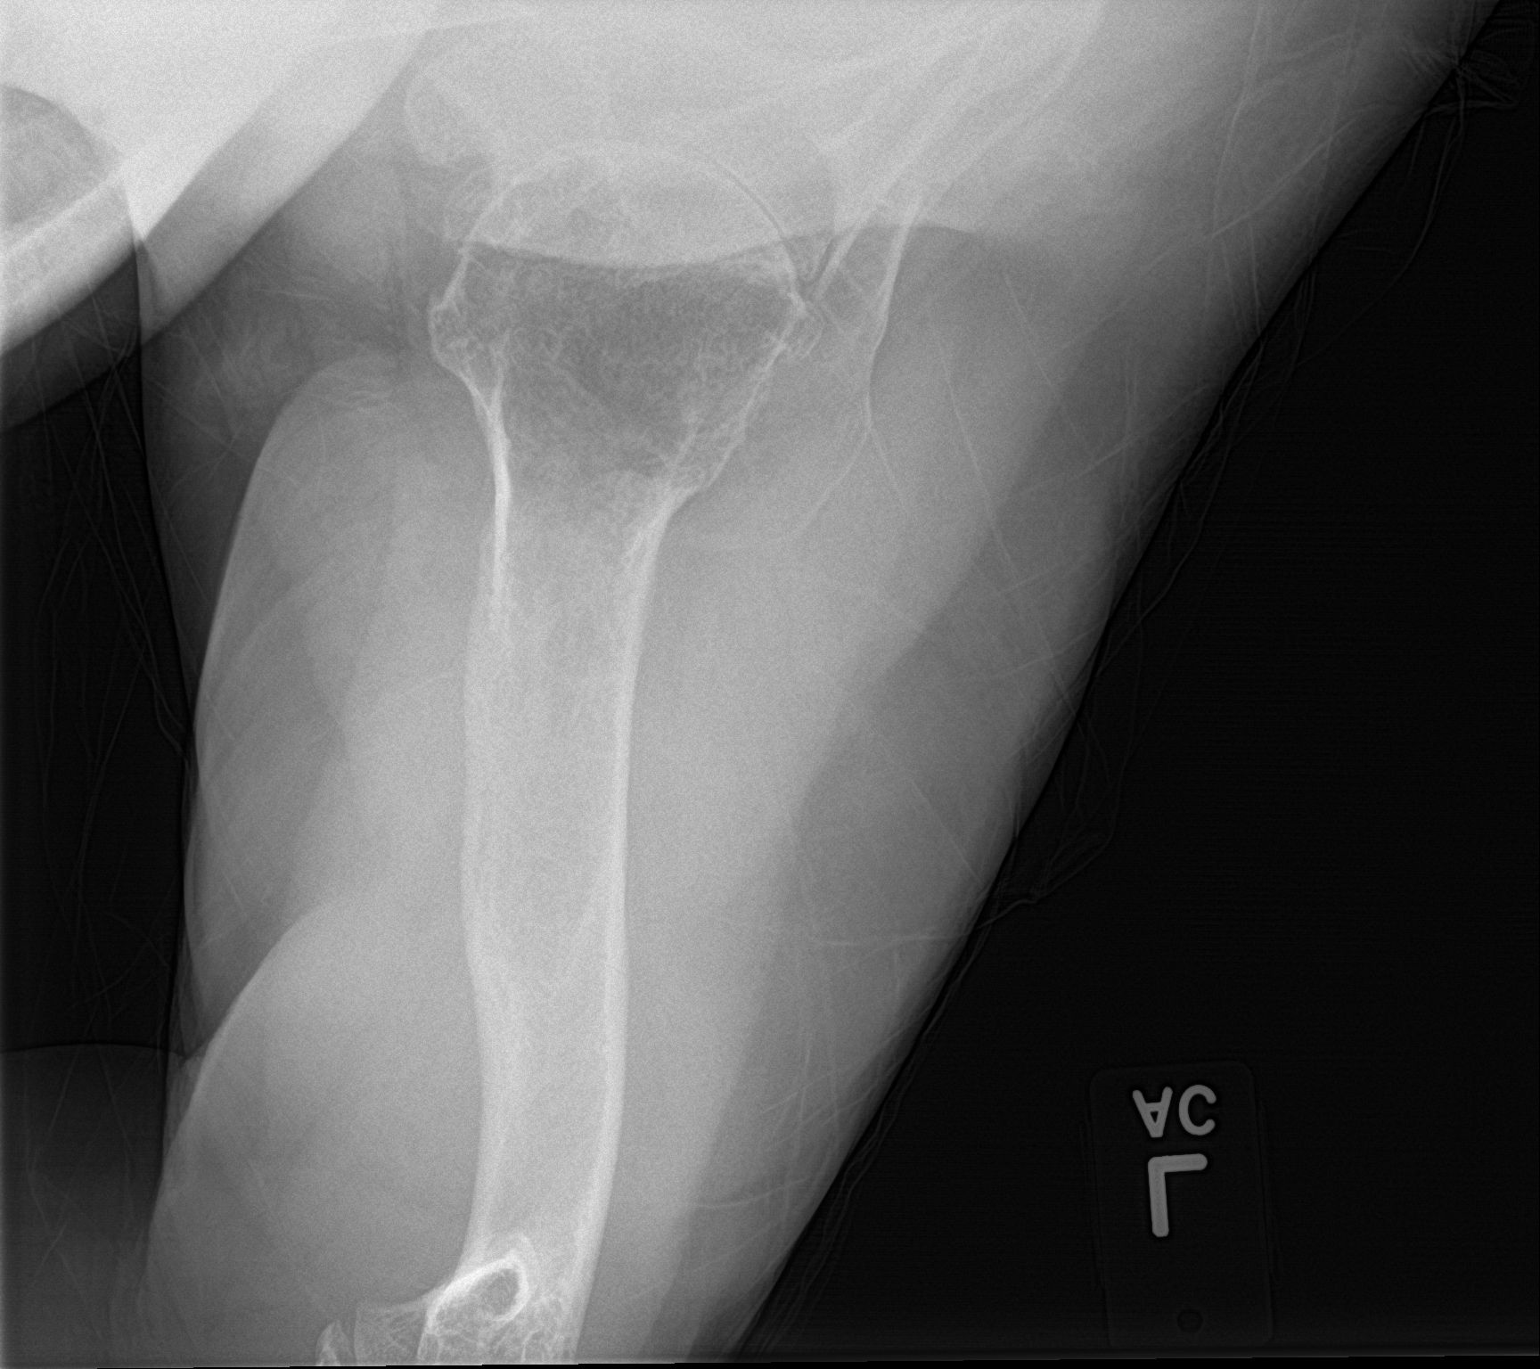

[3 of 3 positions shown; findings below may reference images not displayed]

FINDINGS: The humeral head is well-formed and located. Osteopenia. The
subacromial, glenohumeral joint spaces are intact. Widened
acromioclavicular joint space on Y-view. No destructive bony
lesions. Soft tissue planes are non-suspicious.
IMPRESSION: No acute fracture deformity or dislocation.

Widened AC joint space seen on single view, recommend correlation
with point tenderness.

Osteopenia, decreasing sensitivity for acute nondisplaced fractures.

## 2017-03-28 IMAGING — DX DG CERVICAL SPINE COMPLETE 4+V
6 series · 6 of 6 positions shown · non-contrast
Comparison: None.

CLINICAL DATA: Fell off ladder 2 hours ago, LEFT neck and shoulder
pain. History of 2 prior cervical spine surgeries.

EXAM:
CERVICAL SPINE - COMPLETE 4+ VIEW

[c-spine lat]
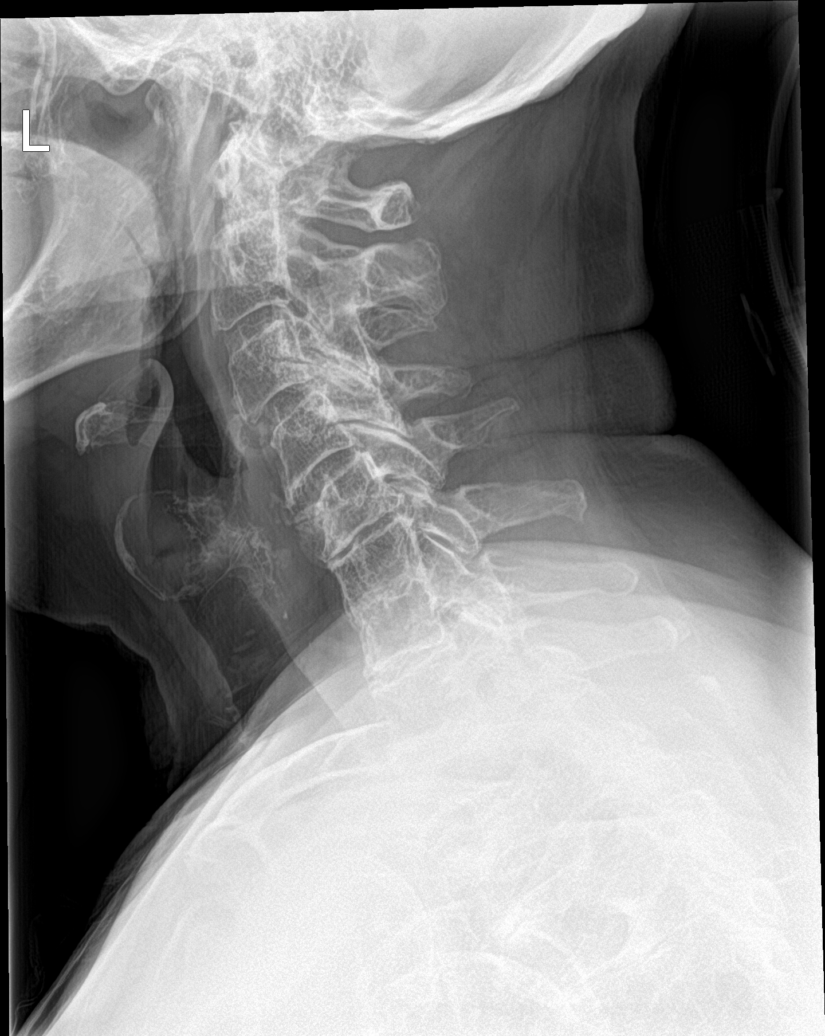

[c-spine obl (1 of 2)]
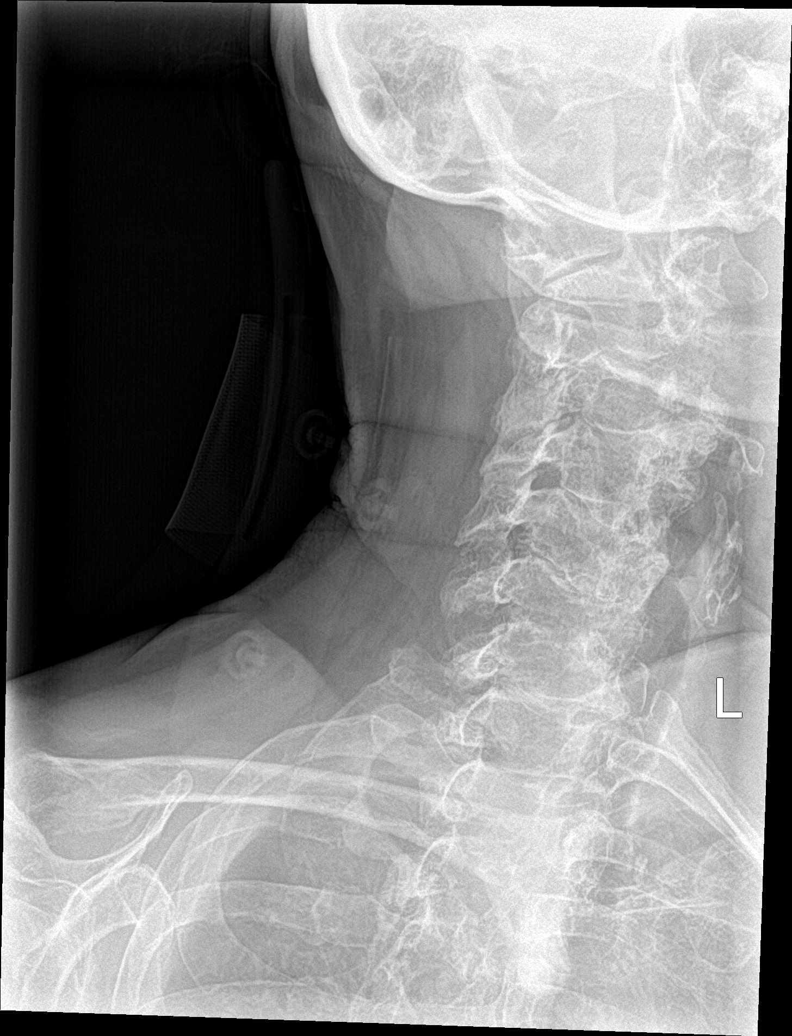

[c-spine obl (2 of 2)]
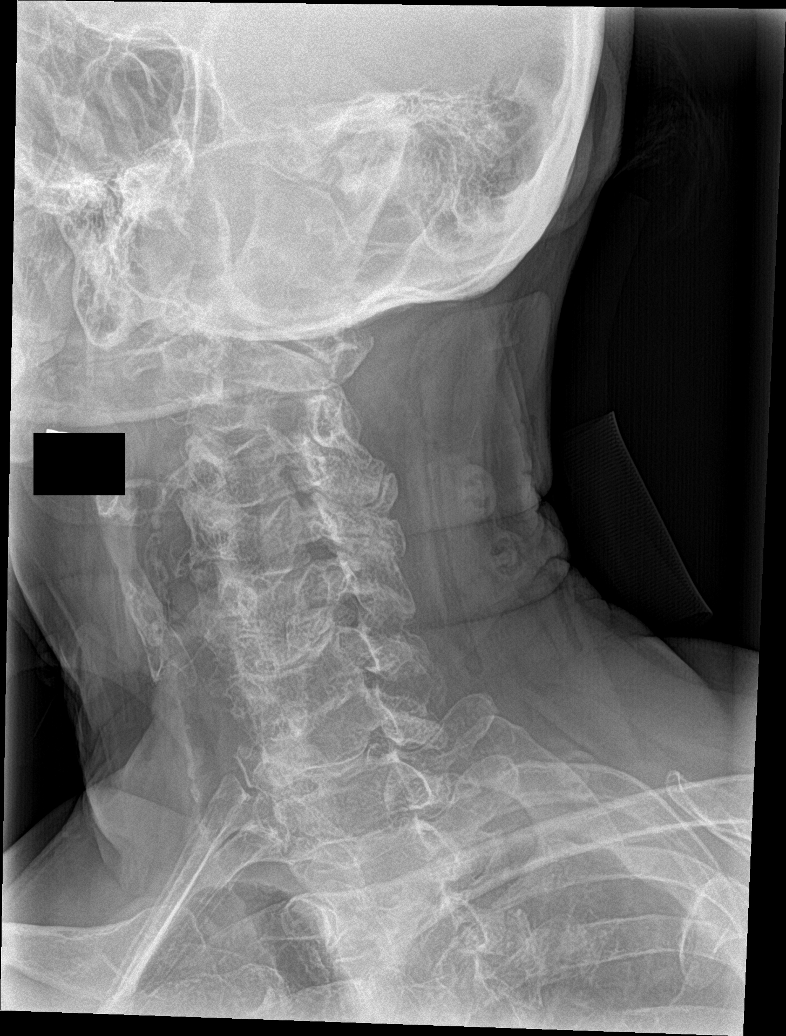

[c-spine ap]
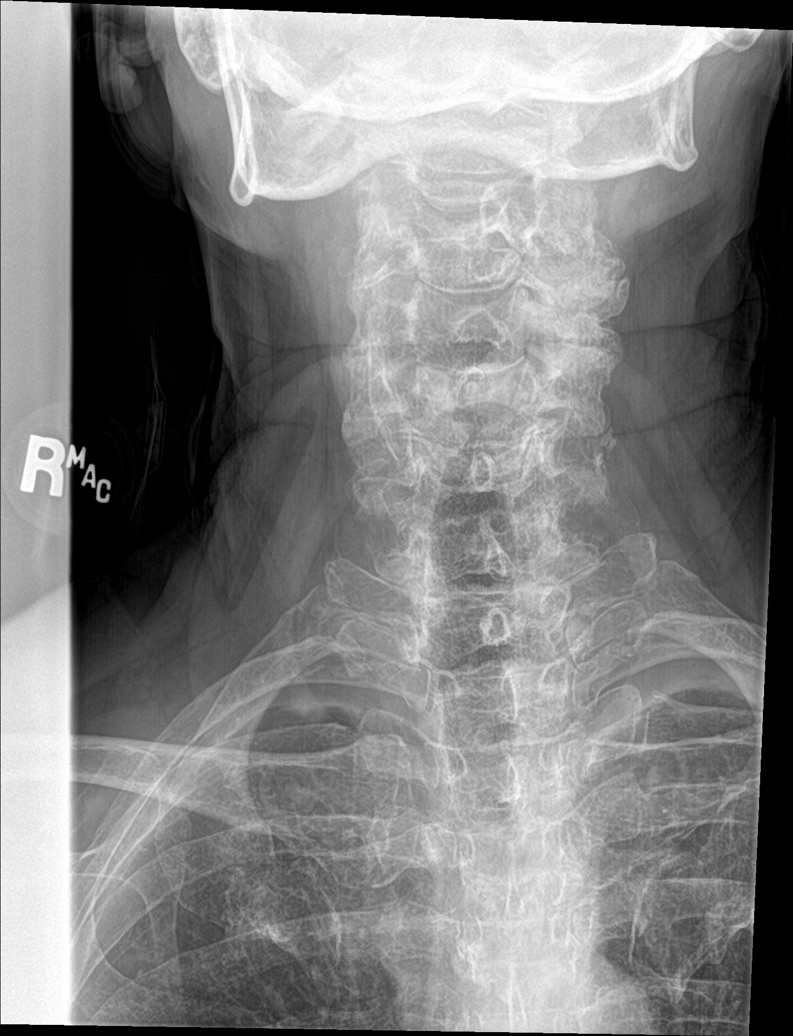

[c-spine open mouth]
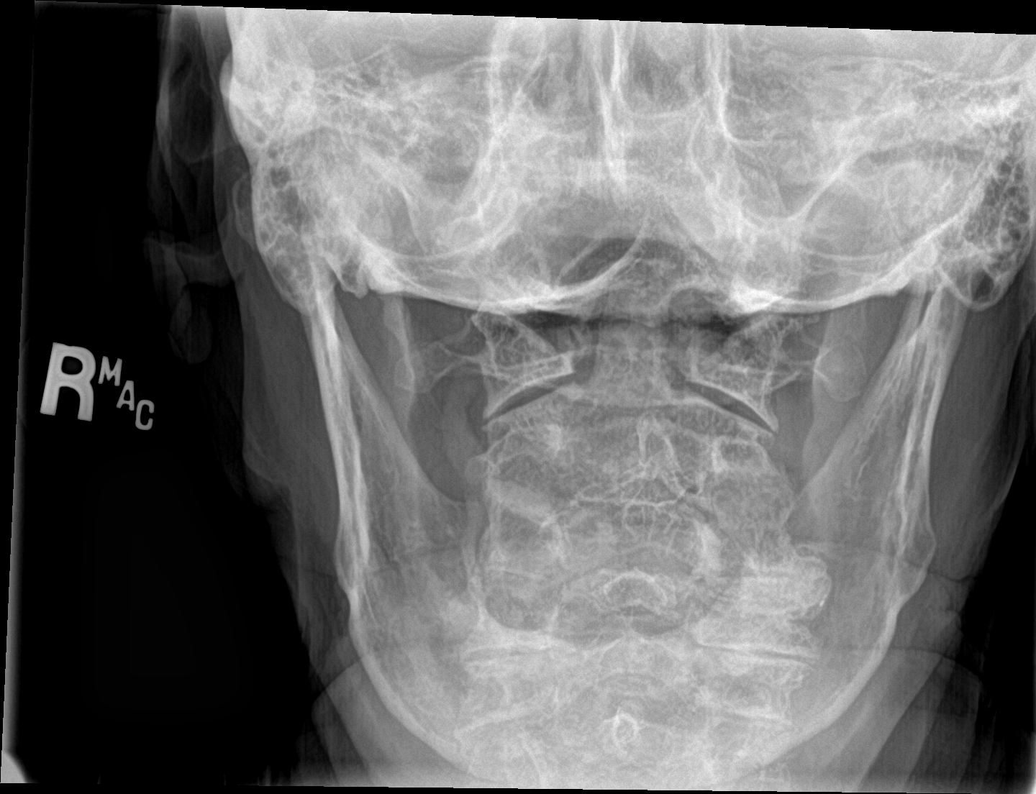

[c-spine swimmers]
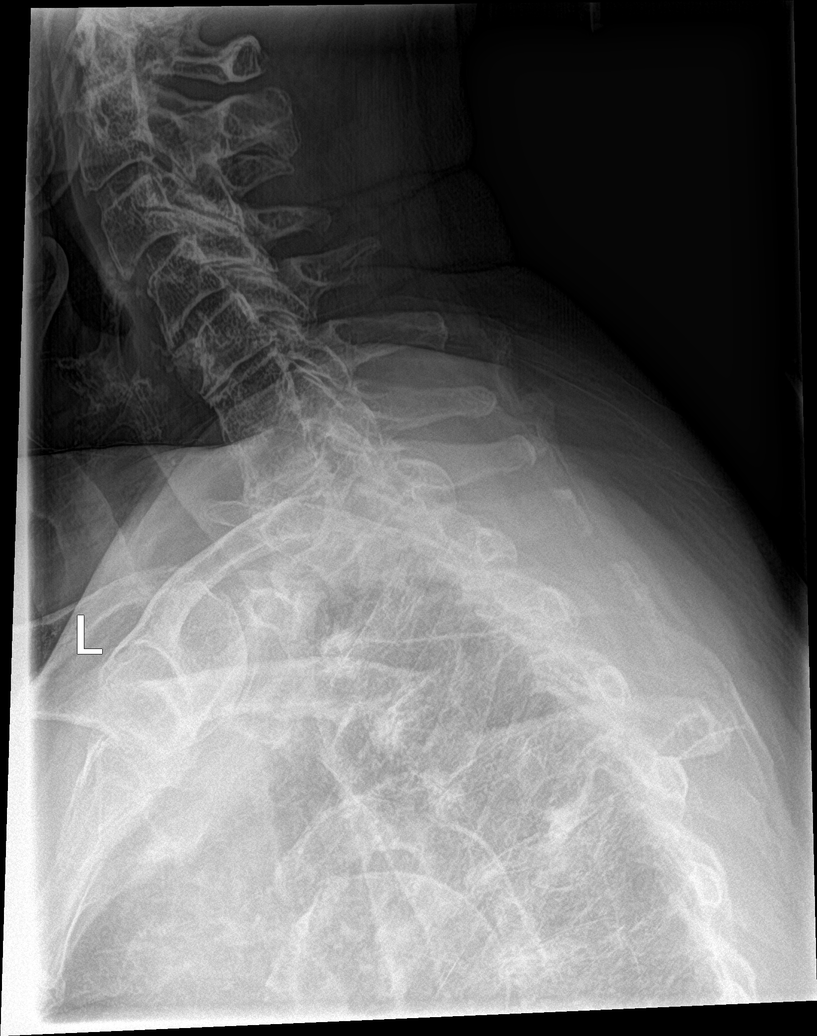

[6 of 6 positions shown; findings below may reference images not displayed]

FINDINGS: Cervical vertebral bodies intact. Straightened cervical lordosis.
C6-7 arthrodesis, severe C5-6 disc height loss, mild at C4-5 with
endplate spurring compatible with degenerative discs. Multilevel
severe facet arthropathy. At least moderate suspected LEFT C3-4
neural foraminal narrowing. Lateral masses in alignment. Osteopenia
without destructive bony lesions. Prevertebral and paraspinal soft
tissue planes are nonsuspicious.
IMPRESSION: Degenerative change without acute fracture deformity or
malalignment.

## 2017-03-28 NOTE — ED Provider Notes (Addendum)
[facility] Provider Note   CSN: [id number] Arrival date & time: 03/28/17  2115  By signing my name below, I, Selah Klang, attest that this documentation has been prepared under the direction and in the presence of physician practitioner, Rolan Bucco, MD. Electronically Signed: Linna Darner, Scribe. 03/28/2017. 10:42 PM.  History   Chief Complaint Chief Complaint  Patient presents with  . Fall   The history is provided by the patient. No language interpreter was used.    HPI Comments: Connie Oconnor is a 76 y.o. female brought in by family, with PMHx of arthritis, who presents to the Emergency Department complaining of constant neck pain and shoulder pain s/p mechanical fall that occurred shortly PTA. She suffered a mechanical fall while walking down the final step of an outdoor staircase and landed on her left side. No LOC but she was slightly confused after the incident. Patient reports some left shoulder and trapezius pain in addition to her neck pain. She reports her pain is exacerbated by left shoulder movement. Patient took Flexeril prior to coming to the ED with some improvement of her pain. She has a pertinent PSHx of cervical laminectomy and left shoulder surgery. Patient denies numbness/tingling, lower extremity pain, bruising, open wounds, or any other associated symptoms. She is followed by [employer].  Past Medical History:  Diagnosis Date  . Arthritis     There are no active problems to display for this patient.   Past Surgical History:  Procedure Laterality Date  . ABDOMINAL HYSTERECTOMY    . BACK SURGERY    . CERVICAL LAMINECTOMY    . DILATION AND CURETTAGE OF UTERUS    . ELBOW SURGERY Right   . LUMBAR LAMINECTOMY    . SHOULDER SURGERY Left   . TONSILLECTOMY    . WISDOM TOOTH EXTRACTION      OB History    No data available       Home Medications    Prior to Admission medications   Medication Sig Start Date End Date  Taking? Authorizing Provider  Cholecalciferol (VITAMIN D PO) Take by mouth.   Yes [provider]  acetaminophen (TYLENOL) 325 MG tablet Take 650 mg by mouth as needed.    [provider]  diphenhydrAMINE (BENADRYL) 25 MG tablet Take 25 mg by mouth every 6 (six) hours as needed.    [provider]  glucosamine-chondroitin 500-400 MG tablet Take 1 tablet by mouth 3 (three) times daily.    [provider]  HYDROcodone-acetaminophen (NORCO/VICODIN) 5-325 MG tablet Take 1 tablet by mouth every 6 (six) hours as needed for moderate pain. 09/24/15   Lattie Haw, MD  VITAMIN D, ERGOCALCIFEROL, PO Take by mouth.    [provider]    Family History Family History  Problem Relation Age of Onset  . Stroke Mother   . Heart attack Father     Social History Social History  Substance Use Topics  . Smoking status: Current Every Day Smoker    Packs/day: 1.00    Types: Cigarettes  . Smokeless tobacco: Never Used  . Alcohol use No     Allergies   Aspirin and Penicillins   Review of Systems Review of Systems  Constitutional: Negative for fever.  Gastrointestinal: Negative for nausea and vomiting.  Musculoskeletal: Positive for arthralgias, joint swelling, myalgias and neck pain. Negative for back pain.  Skin: Negative for color change and wound.  Neurological: Negative for syncope, weakness, numbness and headaches.  All other systems  reviewed and are negative.  Physical Exam Updated Vital Signs BP (!) 176/66 (BP Location: Right Arm)   Pulse 65   Temp 98 F (36.7 C) (Oral)   Resp 16   Ht 5\' 5"  (1.651 m)   Wt 77.1 kg (170 lb)   SpO2 98%   BMI 28.29 kg/m   Physical Exam  Constitutional: She is oriented to person, place, and time. She appears well-developed and well-nourished.  HENT:  Head: Normocephalic and atraumatic.  Cardiovascular: Normal rate.   Pulses:      Radial pulses are 2+ on the left side.  Pulmonary/Chest: Effort  normal.  Musculoskeletal: She exhibits tenderness.  Tenderness throughout cervical spine. No pain to the thoracic or lumbosacral spine. No step offs or deformities. Positive tenderness along the left trapezius and left scapula and diffusely around the left shoulder. No pain to the left elbow or wrist. No other pain on palpation or ROM of other extremities.   Neurological: She is alert and oriented to person, place, and time.  Normal sensation and motor function in left hand.  Skin: Skin is warm and dry.  Psychiatric: She has a normal mood and affect.   ED Treatments / Results  Labs (all labs ordered are listed, but only abnormal results are displayed) Labs Reviewed  BASIC METABOLIC PANEL  CBC WITH DIFFERENTIAL/PLATELET  PROTIME-INR  APTT    EKG  EKG Interpretation None       Radiology Dg Cervical Spine Complete  Result Date: 03/28/2017 CLINICAL DATA:  Larey Seat off ladder 2 hours ago, LEFT neck and shoulder pain. History of 2 prior cervical spine surgeries. EXAM: CERVICAL SPINE - COMPLETE 4+ VIEW COMPARISON:  None. FINDINGS: Cervical vertebral bodies intact. Straightened cervical lordosis. C6-7 arthrodesis, severe C5-6 disc height loss, mild at C4-5 with endplate spurring compatible with degenerative discs. Multilevel severe facet arthropathy. At least moderate suspected LEFT C3-4 neural foraminal narrowing. Lateral masses in alignment. Osteopenia without destructive bony lesions. Prevertebral and paraspinal soft tissue planes are nonsuspicious. IMPRESSION: Degenerative change without acute fracture deformity or malalignment. Electronically Signed   By: Awilda Metro M.D.   On: 03/28/2017 22:24   Ct Head Wo Contrast  Result Date: 03/28/2017 CLINICAL DATA:  76 year old female with fall EXAM: CT HEAD WITHOUT CONTRAST CT CERVICAL SPINE WITHOUT CONTRAST TECHNIQUE: Multidetector CT imaging of the head and cervical spine was performed following the standard protocol without intravenous  contrast. Multiplanar CT image reconstructions of the cervical spine were also generated. COMPARISON:  None. FINDINGS: CT HEAD FINDINGS Brain: There is moderate age-related atrophy and chronic microvascular ischemic changes. A thin linear high attenuation involving the right frontal lobe along the falx (series 2 image 20, series 4, image 23, and series 5 image 26 is concerning for a small subarachnoid hemorrhage. Close follow-up recommended. No other intracranial hemorrhage identified. There is no mass effect or midline shift. Vascular: No hyperdense vessel or unexpected calcification. Skull: Normal. Negative for fracture or focal lesion. Sinuses/Orbits: Mild mucoperiosteal thickening of paranasal sinuses. No air-fluid levels. The mastoid air cells are clear. Other: None CT CERVICAL SPINE FINDINGS Alignment: No acute subluxation. Mild reversal of normal cervical lordosis at C5-C6, likely related to degenerative changes. Skull base and vertebrae: No acute fracture.  Osteopenia. Soft tissues and spinal canal: No prevertebral fluid or swelling. No visible canal hematoma. Disc levels: Multilevel degenerative changes most prominent at C5-C6 and C6-C7 where there is disc space narrowing and endplate irregularity. Upper chest: Negative. Other: Bilateral carotid bulb atherosclerotic plaques. IMPRESSION: 1. Findings  suspicious for a very small subarachnoid hemorrhage involving the medial right frontal lobe along the falx. Close follow-up recommended. 2. Moderate age-related atrophy and chronic microvascular ischemic changes. 3. No acute/traumatic cervical spine pathology. These results were called by telephone at the time of interpretation on 03/28/2017 at 11:06 pm to Dr. Shawna OrleansMELANIE Jeter Tomey , who verbally acknowledged these results. Electronically Signed   By: Elgie CollardArash  Radparvar M.D.   On: 03/28/2017 23:20   Ct Cervical Spine Wo Contrast  Result Date: 03/28/2017 CLINICAL DATA:  76 year old female with fall EXAM: CT HEAD WITHOUT  CONTRAST CT CERVICAL SPINE WITHOUT CONTRAST TECHNIQUE: Multidetector CT imaging of the head and cervical spine was performed following the standard protocol without intravenous contrast. Multiplanar CT image reconstructions of the cervical spine were also generated. COMPARISON:  None. FINDINGS: CT HEAD FINDINGS Brain: There is moderate age-related atrophy and chronic microvascular ischemic changes. A thin linear high attenuation involving the right frontal lobe along the falx (series 2 image 20, series 4, image 23, and series 5 image 26 is concerning for a small subarachnoid hemorrhage. Close follow-up recommended. No other intracranial hemorrhage identified. There is no mass effect or midline shift. Vascular: No hyperdense vessel or unexpected calcification. Skull: Normal. Negative for fracture or focal lesion. Sinuses/Orbits: Mild mucoperiosteal thickening of paranasal sinuses. No air-fluid levels. The mastoid air cells are clear. Other: None CT CERVICAL SPINE FINDINGS Alignment: No acute subluxation. Mild reversal of normal cervical lordosis at C5-C6, likely related to degenerative changes. Skull base and vertebrae: No acute fracture.  Osteopenia. Soft tissues and spinal canal: No prevertebral fluid or swelling. No visible canal hematoma. Disc levels: Multilevel degenerative changes most prominent at C5-C6 and C6-C7 where there is disc space narrowing and endplate irregularity. Upper chest: Negative. Other: Bilateral carotid bulb atherosclerotic plaques. IMPRESSION: 1. Findings suspicious for a very small subarachnoid hemorrhage involving the medial right frontal lobe along the falx. Close follow-up recommended. 2. Moderate age-related atrophy and chronic microvascular ischemic changes. 3. No acute/traumatic cervical spine pathology. These results were called by telephone at the time of interpretation on 03/28/2017 at 11:06 pm to Dr. Shawna OrleansMELANIE Zniyah Midkiff , who verbally acknowledged these results. Electronically Signed    By: Elgie CollardArash  Radparvar M.D.   On: 03/28/2017 23:20   Dg Shoulder Left  Result Date: 03/28/2017 CLINICAL DATA:  Larey SeatFell 2 hours ago, pain. EXAM: LEFT SHOULDER - 2+ VIEW COMPARISON:  None. FINDINGS: The humeral head is well-formed and located. Osteopenia. The subacromial, glenohumeral joint spaces are intact. Widened acromioclavicular joint space on Y-view. No destructive bony lesions. Soft tissue planes are non-suspicious. IMPRESSION: No acute fracture deformity or dislocation. Widened AC joint space seen on single view, recommend correlation with point tenderness. Osteopenia, decreasing sensitivity for acute nondisplaced fractures. Electronically Signed   By: Awilda Metroourtnay  Bloomer M.D.   On: 03/28/2017 22:26    Procedures Procedures (including critical care time)  DIAGNOSTIC STUDIES: Oxygen Saturation is 98% on RA, normal by my interpretation.    COORDINATION OF CARE: 10:40 PM Discussed treatment plan with pt at bedside and pt agreed to plan.  Medications Ordered in ED Medications - No data to display   Initial Impression / Assessment and Plan / ED Course  I have reviewed the triage vital signs and the nursing notes.  Pertinent labs & imaging results that were available during my care of the patient were reviewed by me and considered in my medical decision making (see chart for details).     Patient presents after mechanical fall. She has pain  to her left cervical paraspinal area and left shoulder. There is no evidence of acute fracture or dislocation. She was placed in a shoulder sling. CT of the head shows a small subarachnoid hemorrhage. A consult with neurosurgery. The PA, Mr. Adela Glimpse, call back and has recommended admission for observation to their service. He requested the patient be transferred to the [facility] emergency department where they can see the patient and admit the patient. Patient was notified of this plan. I also spoke with the EDP, Dr. Patria Mane who is aware of the patient.  I  did order some basic labs which Dr. Read Drivers will follow up on.  Final Clinical Impressions(s) / ED Diagnoses   Final diagnoses:  Fall, initial encounter  Injury of head, initial encounter  SAH (subarachnoid hemorrhage) (HCC)  Contusion of left shoulder, initial encounter    New Prescriptions New Prescriptions   No medications on file   I personally performed the services described in this documentation, which was scribed in my presence.  The recorded information has been reviewed and considered.    Rolan Bucco, MD 03/28/17 9147    Rolan Bucco, MD 03/28/17 [phone]

## 2017-03-28 NOTE — ED Notes (Signed)
ED Provider at bedside. 

## 2017-03-28 NOTE — ED Triage Notes (Signed)
C/o trip/fall on steps approx 1 hour PTA-pain to left side of neck and left shoulder-does not recall entire events of fall-NAD-presents to triage in w/c

## 2017-03-28 NOTE — ED Notes (Addendum)
Hard cervical collar applied in triage.

## 2017-03-28 NOTE — ED Notes (Signed)
Patient took some Flexeril prior arrival.

## 2017-03-29 ENCOUNTER — Encounter (HOSPITAL_COMMUNITY): Payer: Self-pay

## 2017-03-29 ENCOUNTER — Observation Stay (HOSPITAL_COMMUNITY): Payer: Medicare Other

## 2017-03-29 DIAGNOSIS — I609 Nontraumatic subarachnoid hemorrhage, unspecified: Secondary | ICD-10-CM

## 2017-03-29 DIAGNOSIS — S066X0A Traumatic subarachnoid hemorrhage without loss of consciousness, initial encounter: Secondary | ICD-10-CM | POA: Diagnosis not present

## 2017-03-29 LAB — APTT: aPTT: 26 seconds (ref 24–36)

## 2017-03-29 LAB — PROTIME-INR
INR: 0.94
Prothrombin Time: 12.6 seconds (ref 11.4–15.2)

## 2017-03-29 LAB — BASIC METABOLIC PANEL
ANION GAP: 7 (ref 5–15)
BUN: 13 mg/dL (ref 6–20)
CALCIUM: 8.6 mg/dL — AB (ref 8.9–10.3)
CO2: 25 mmol/L (ref 22–32)
CREATININE: 0.96 mg/dL (ref 0.44–1.00)
Chloride: 107 mmol/L (ref 101–111)
GFR calc Af Amer: >60 mL/min (ref >60)
GFR, EST NON AFRICAN AMERICAN: 56 mL/min — AB (ref >60)
GLUCOSE: 140 mg/dL — AB (ref 65–99)
Potassium: 3.3 mmol/L — ABNORMAL LOW (ref 3.5–5.1)
Sodium: 139 mmol/L (ref 135–145)

## 2017-03-29 LAB — CBC WITH DIFFERENTIAL/PLATELET
BASOS PCT: 0 %
Basophils Absolute: 0 10*3/uL (ref 0.0–0.1)
EOS PCT: 1 %
Eosinophils Absolute: 0.1 10*3/uL (ref 0.0–0.7)
HEMATOCRIT: 39.2 % (ref 36.0–46.0)
Hemoglobin: 13.1 g/dL (ref 12.0–15.0)
Lymphocytes Relative: 7 %
Lymphs Abs: 0.7 10*3/uL (ref 0.7–4.0)
MCH: 29.2 pg (ref 26.0–34.0)
MCHC: 33.4 g/dL (ref 30.0–36.0)
MCV: 87.3 fL (ref 78.0–100.0)
MONOS PCT: 5 %
Monocytes Absolute: 0.5 10*3/uL (ref 0.1–1.0)
NEUTROS ABS: 9.7 10*3/uL — AB (ref 1.7–7.7)
Neutrophils Relative %: 87 %
PLATELETS: 229 10*3/uL (ref 150–400)
RBC: 4.49 MIL/uL (ref 3.87–5.11)
RDW: 13.2 % (ref 11.5–15.5)
WBC: 11 10*3/uL — ABNORMAL HIGH (ref 4.0–10.5)

## 2017-03-29 LAB — GLUCOSE, CAPILLARY: Glucose-Capillary: 115 mg/dL — ABNORMAL HIGH (ref 65–99)

## 2017-03-29 LAB — MRSA PCR SCREENING: MRSA BY PCR: NEGATIVE

## 2017-03-29 IMAGING — CT CT HEAD W/O CM
3 series · 15 of 47 positions shown, 18 images · non-contrast
Comparison: [DATE] CT.

CLINICAL DATA: 76-year-old female post fall with intracranial
hemorrhage. Subsequent encounter.

EXAM:
CT HEAD WITHOUT CONTRAST
TECHNIQUE: Contiguous axial images were obtained from the base of the skull
through the vertex without intravenous contrast.

[Series 3: head 5.0 h30s · axial · 0.40mm/px · z∈[-73,+52]mm · 9 of 31 slices shown, 12 images]
[im 3/31  brain]
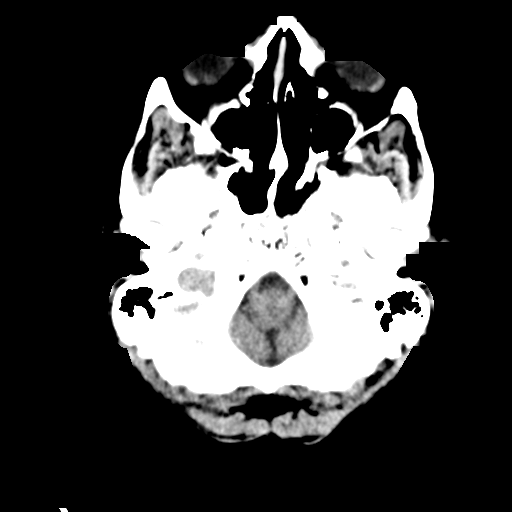
[im 3/31  bone]
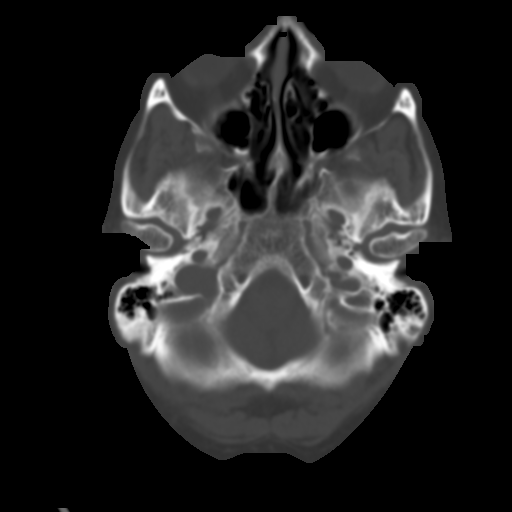
[im 6/31  brain]
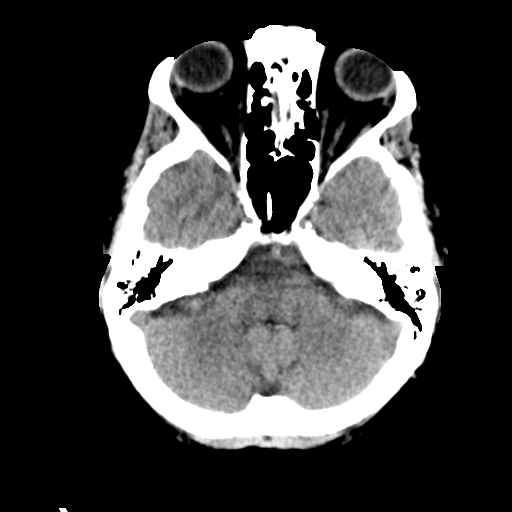
[im 9/31  brain]
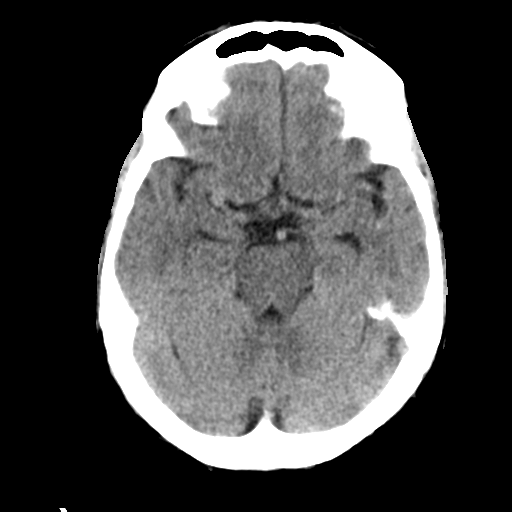
[im 12/31  brain]
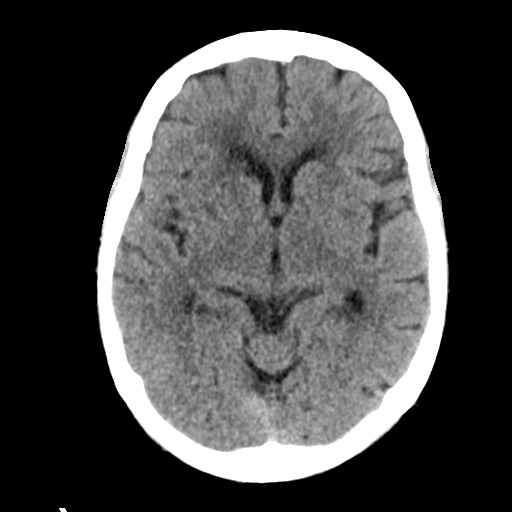
[im 16/31  brain]
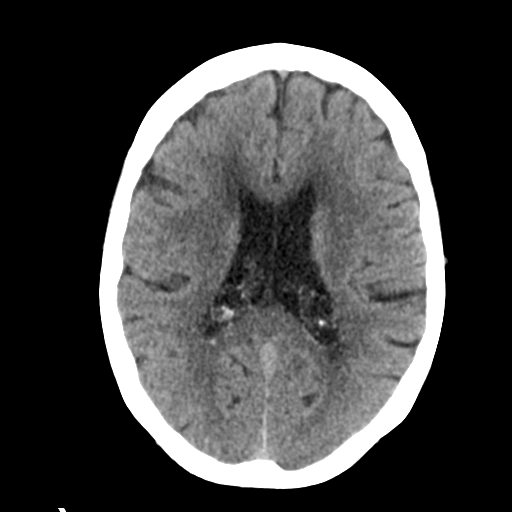
[im 16/31  bone]
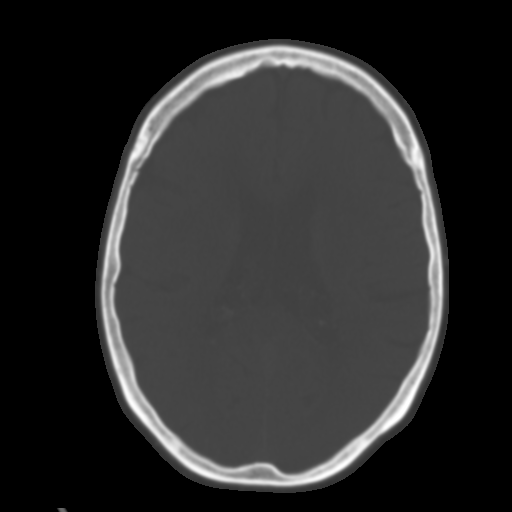
[im 19/31  brain]
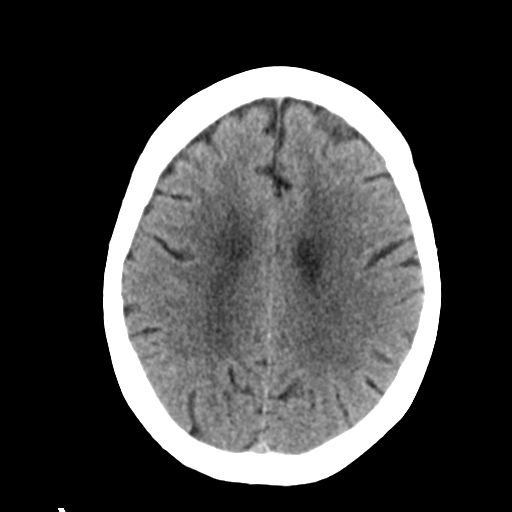
[im 22/31  brain]
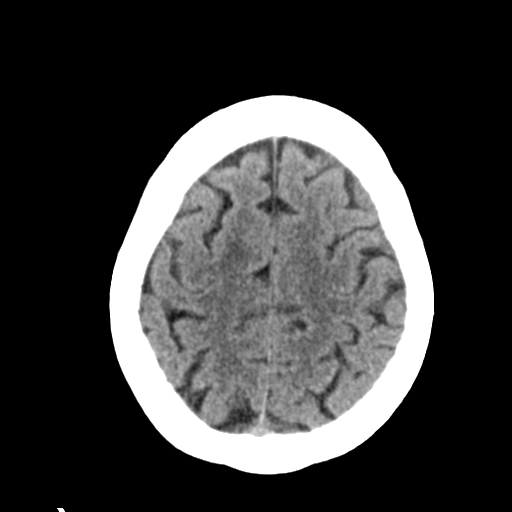
[im 25/31  brain]
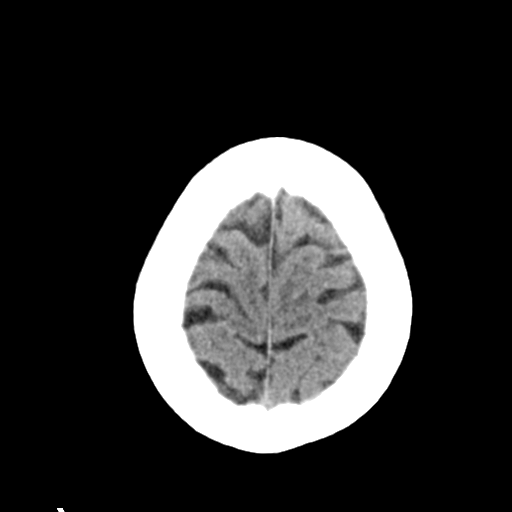
[im 28/31  brain]
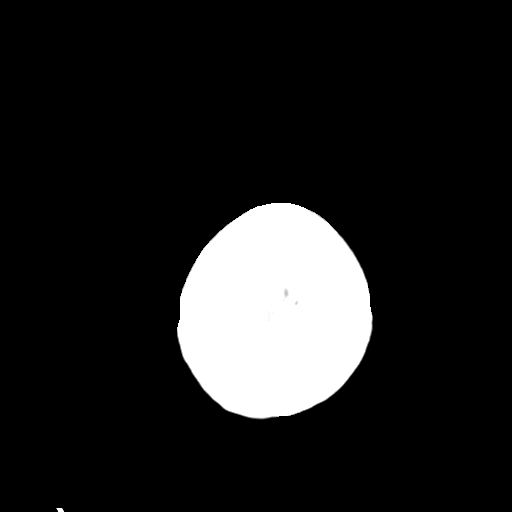
[im 28/31  bone]
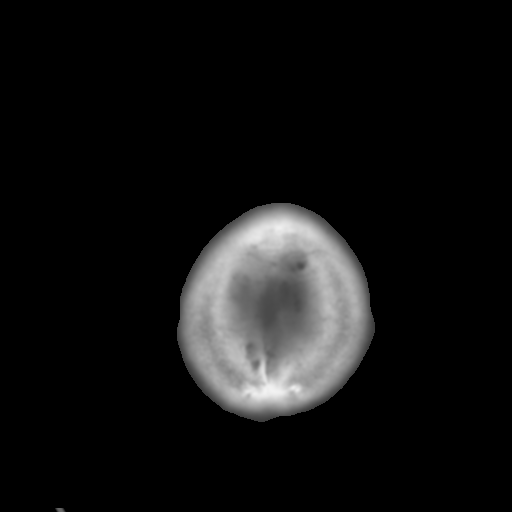

[Series 5: head 3.0 mpr cor · coronal · 0.30mm/px · 3 of 67 slices shown]
[im 23/67  brain]
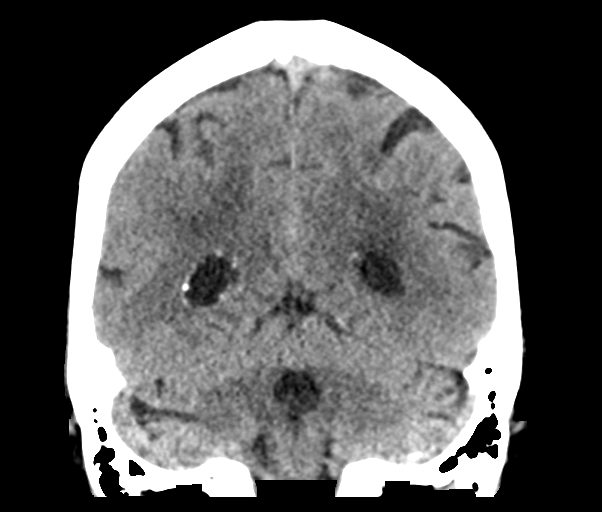
[im 30/67  brain]
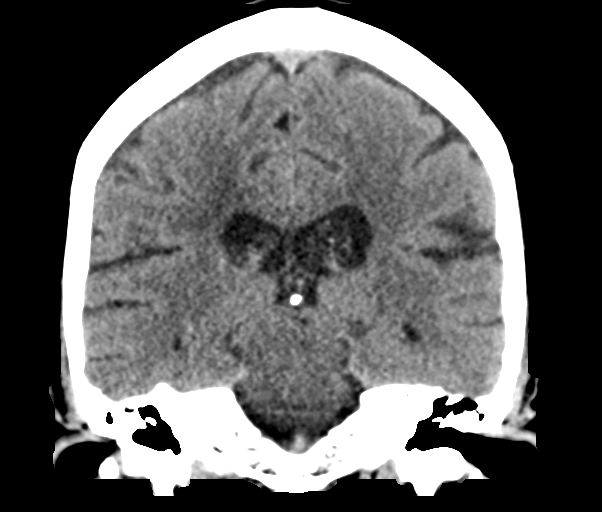
[im 37/67  brain]
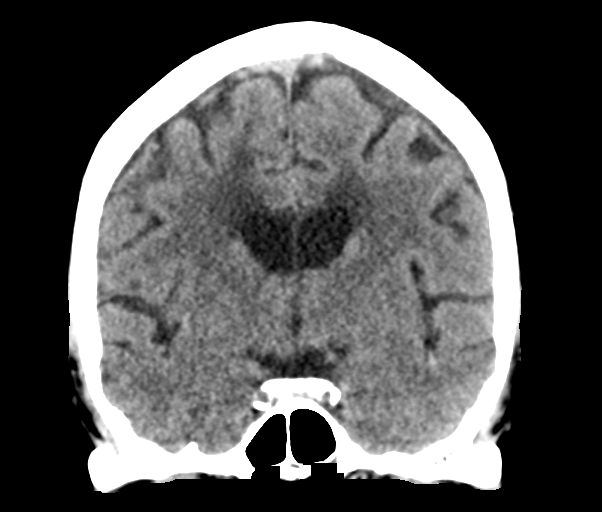

[Series 6: head 3.0 mpr sag · sagittal · 0.30mm/px · 3 of 63 slices shown]
[im 21/63  brain]
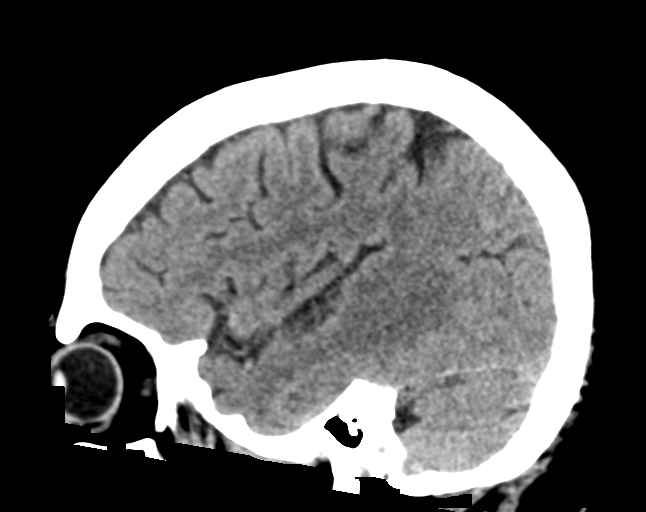
[im 32/63  brain]
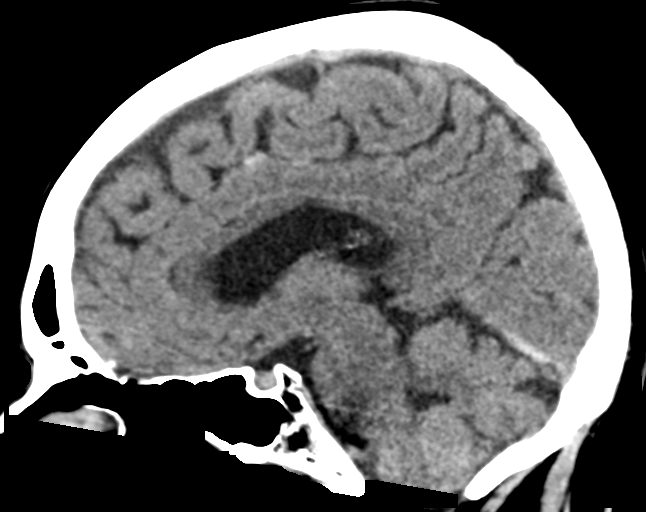
[im 42/63  brain]
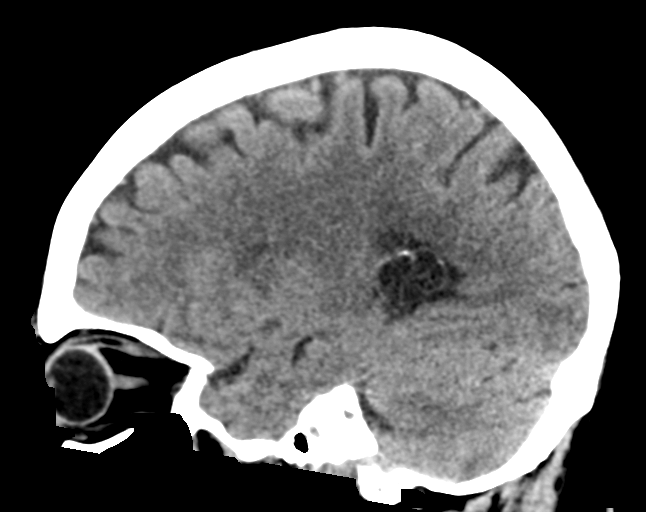

[15 of 47 positions shown; findings below may reference images not displayed]

FINDINGS: Brain: Tiny amount of subarachnoid blood within sulcus of medial
right frontal lobe similar to prior exam.

Right tentorium appears slightly thick. Question tiny subdural blood
versus normal variant without change from yesterday.

No new areas of intracranial hemorrhage.

Remote infarcts left thalamus and right caudate head. Moderate
chronic microvascular changes.

No hydrocephalus.

No intracranial mass lesion noted on this unenhanced exam.

Vascular: Vascular calcifications.

Skull: No skull fracture.

Sinuses/Orbits: No acute orbital abnormality. Mucosal thickening
left frontal sinus and anterior ethmoid sinus air cells bilaterally.

Other: Mastoid air cells and middle ear cavities are clear.
IMPRESSION: Overall no significant change.

Tiny amount of subarachnoid blood within sulcus of medial right
frontal lobe similar to prior exam.

Right tentorium appears slightly thick. Question tiny subdural blood
versus normal variant without change from yesterday.

No new areas of intracranial hemorrhage.

Remote infarcts left thalamus and right caudate head. Moderate
chronic microvascular changes.

Mucosal thickening left frontal sinus and anterior ethmoid sinus air
cells bilaterally.

## 2017-03-29 MED ORDER — ONDANSETRON HCL 4 MG PO TABS: 4.0000 mg | ORAL_TABLET | Freq: Four times a day (QID) | ORAL | Status: DC | PRN

## 2017-03-29 MED ORDER — LEVETIRACETAM 500 MG PO TABS: 500.0000 mg | ORAL_TABLET | Freq: Two times a day (BID) | ORAL | 0 refills | Status: DC

## 2017-03-29 MED ORDER — PRAVASTATIN SODIUM 20 MG PO TABS: 20.0000 mg | ORAL_TABLET | Freq: Every evening | ORAL | Status: DC

## 2017-03-29 MED ORDER — ACETAMINOPHEN 325 MG PO TABS: 650.0000 mg | ORAL_TABLET | Freq: Four times a day (QID) | ORAL | Status: DC | PRN

## 2017-03-29 MED ORDER — ACETAMINOPHEN 650 MG RE SUPP: 650.0000 mg | Freq: Four times a day (QID) | RECTAL | Status: DC | PRN

## 2017-03-29 MED ORDER — GLUCOSAMINE-CHONDROITIN 500-400 MG PO TABS: 1.0000 | ORAL_TABLET | Freq: Two times a day (BID) | ORAL | Status: DC

## 2017-03-29 MED ORDER — SODIUM CHLORIDE 0.9 % IV SOLN
250.0000 mL | INTRAVENOUS | Status: DC | PRN
  Administered 2017-03-29: 250 mL via INTRAVENOUS

## 2017-03-29 MED ORDER — ZOLPIDEM TARTRATE 5 MG PO TABS
5.0000 mg | ORAL_TABLET | Freq: Every evening | ORAL | Status: DC | PRN
Start: 2017-03-29 — End: 2017-03-29

## 2017-03-29 MED ORDER — VITAMIN D 1000 UNITS PO TABS
1000.0000 [IU] | ORAL_TABLET | Freq: Every day | ORAL | Status: DC
  Administered 2017-03-29: 1000 [IU] via ORAL
  Filled 2017-03-29: qty 1

## 2017-03-29 MED ORDER — SODIUM CHLORIDE 0.9 % IV SOLN
500.0000 mg | Freq: Two times a day (BID) | INTRAVENOUS | Status: DC
  Administered 2017-03-29: 500 mg via INTRAVENOUS
  Filled 2017-03-29 (×2): qty 5

## 2017-03-29 MED ORDER — HYDROCODONE-ACETAMINOPHEN 5-325 MG PO TABS: 1.0000 | ORAL_TABLET | Freq: Four times a day (QID) | ORAL | Status: DC | PRN

## 2017-03-29 MED ORDER — DOCUSATE SODIUM 100 MG PO CAPS
100.0000 mg | ORAL_CAPSULE | Freq: Two times a day (BID) | ORAL | Status: DC
  Administered 2017-03-29: 100 mg via ORAL
  Filled 2017-03-29: qty 1

## 2017-03-29 MED ORDER — BISACODYL 5 MG PO TBEC
5.0000 mg | DELAYED_RELEASE_TABLET | Freq: Every day | ORAL | Status: DC | PRN
  Filled 2017-03-29: qty 1

## 2017-03-29 MED ORDER — HYDROCODONE-ACETAMINOPHEN 5-325 MG PO TABS: 1.0000 | ORAL_TABLET | ORAL | Status: DC | PRN

## 2017-03-29 MED ORDER — SODIUM CHLORIDE 0.9% FLUSH: 3.0000 mL | Freq: Two times a day (BID) | INTRAVENOUS | Status: DC

## 2017-03-29 MED ORDER — FLEET ENEMA 7-19 GM/118ML RE ENEM: 1.0000 | ENEMA | Freq: Once | RECTAL | Status: DC | PRN

## 2017-03-29 MED ORDER — DIPHENHYDRAMINE HCL 25 MG PO TABS
25.0000 mg | ORAL_TABLET | Freq: Four times a day (QID) | ORAL | Status: DC | PRN
  Filled 2017-03-29: qty 1

## 2017-03-29 MED ORDER — SENNOSIDES-DOCUSATE SODIUM 8.6-50 MG PO TABS
1.0000 | ORAL_TABLET | Freq: Every evening | ORAL | Status: DC | PRN
  Filled 2017-03-29: qty 1

## 2017-03-29 MED ORDER — DIPHENHYDRAMINE HCL 25 MG PO CAPS: 25.0000 mg | ORAL_CAPSULE | Freq: Four times a day (QID) | ORAL | Status: DC | PRN

## 2017-03-29 MED ORDER — CALCIUM 150 MG PO TABS: ORAL_TABLET | Freq: Two times a day (BID) | ORAL | Status: DC

## 2017-03-29 MED ORDER — ONDANSETRON HCL 4 MG/2ML IJ SOLN: 4.0000 mg | Freq: Four times a day (QID) | INTRAMUSCULAR | Status: DC | PRN

## 2017-03-29 MED ORDER — SODIUM CHLORIDE 0.9% FLUSH: 3.0000 mL | INTRAVENOUS | Status: DC | PRN

## 2017-03-29 NOTE — Discharge Summary (Signed)
Physician Discharge Summary  Patient ID: Connie MarusRebecca Crall MRN: [id number] DOB/AGE: [birth year] 76 y.o.  Admit date: 03/28/2017 Discharge date: 03/29/2017  Admission Diagnoses:  Subarachnoid hemorrhage  Discharge Diagnoses:  Same Active Problems:   SAH (subarachnoid hemorrhage) [facility](HCC)   Discharged Condition: Stable  Hospital Course: Patient was admitted for observation after sustaining a SAH secondary to a mechanical fall. A repeat CT scan of her head was ordered and was stable. At the time of discharge, she was neurologically intact and without deficits, denied neuro symptoms,  tolerating po, voiding normal. Ready for discharge. Red flag symptoms were discussed at length for when to seek urgent medical attention.  ROS: Negative ROS  Treatments: Surgery - None  Discharge Exam: Blood pressure 129/63, pulse 63, temperature 97.7 F (36.5 C), temperature source Oral, resp. rate 16, height 5\' 5"  (1.651 m), weight 77.1 kg (170 lb), SpO2 95 %. Awake, alert, oriented Speech fluent, appropriate CN grossly intact 5/5 BUE/BLE Wound c/d/i  Disposition: 01-Home or Self Care  Discharge Instructions    Call MD for:  difficulty breathing, headache or visual disturbances    Complete by:  As directed    Call MD for:  persistant dizziness or light-headedness    Complete by:  As directed    Call MD for:  redness, tenderness, or signs of infection (pain, swelling, redness, odor or green/yellow discharge around incision site)    Complete by:  As directed    Call MD for:  severe uncontrolled pain    Complete by:  As directed    Diet general    Complete by:  As directed    Driving Restrictions    Complete by:  As directed    Do not drive until given clearance.   Increase activity slowly    Complete by:  As directed    Lifting restrictions    Complete by:  As directed    Do not lift anything >10lbs. Avoid bending and twisting in awkward positions. Avoid bending at the back.     Allergies  as of 03/29/2017      Reactions   Aspirin Other (See Comments)   shaking   Penicillins Rash      Medication List    TAKE these medications   acetaminophen 325 MG tablet Commonly known as:  TYLENOL Take 650 mg by mouth every 6 (six) hours as needed for mild pain.   CALCIUM PO Take 1 tablet by mouth 2 (two) times daily.   diphenhydrAMINE 25 MG tablet Commonly known as:  BENADRYL Take 25 mg by mouth every 6 (six) hours as needed for allergies.   glucosamine-chondroitin 500-400 MG tablet Take 1 tablet by mouth 2 (two) times daily.   HYDROcodone-acetaminophen 5-325 MG tablet Commonly known as:  NORCO/VICODIN Take 1 tablet by mouth every 6 (six) hours as needed for moderate pain.   levETIRAcetam 500 MG tablet Commonly known as:  KEPPRA Take 1 tablet (500 mg total) by mouth 2 (two) times daily.   pravastatin 20 MG tablet Commonly known as:  PRAVACHOL Take 20 mg by mouth every evening.   VITAMIN D PO Take 1 tablet by mouth daily.      Follow-up Information    Angeliki Mates, Darci CurrentVincent J, PA-C. Schedule an appointment as soon as possible for a visit in 4 week(s).   Specialty:  Physician Assistant Why:  hospital follow up Contact information: [address] [place] [address] [phone]           Signed: Cindra PresumeCOSTELLA, Karry Barrilleaux  J 03/29/2017, 11:01 AM

## 2017-03-29 NOTE — H&P (Signed)
CC:  Chief Complaint  Patient presents with  . Fall    HPI: Connie Oconnor is a 76 y.o. female who presented to [facility] approx 2130 last night after falling of step stool. Mechanical fall - lost balance. Fell on left shoulder and hit head. No LOC. Since, has had left shoulder pain and a dull headache. In sling for possible AC sep. Denies motor/sensory deficits, bowel or bladder dysfunction, one sided weakness, slurring speech. Not on anti-coagulation. CT revealed small SAH. Transferred to [facility] for NS evaluation.  PMH: Past Medical History:  Diagnosis Date  . Arthritis     PSH: Past Surgical History:  Procedure Laterality Date  . ABDOMINAL HYSTERECTOMY    . BACK SURGERY    . CERVICAL LAMINECTOMY    . DILATION AND CURETTAGE OF UTERUS    . ELBOW SURGERY Right   . LUMBAR LAMINECTOMY    . SHOULDER SURGERY Left   . TONSILLECTOMY    . WISDOM TOOTH EXTRACTION      SH: Social History  Substance Use Topics  . Smoking status: Current Every Day Smoker    Packs/day: 1.00    Types: Cigarettes  . Smokeless tobacco: Never Used  . Alcohol use No    MEDS: Prior to Admission medications   Medication Sig Start Date End Date Taking? Authorizing Provider  Cholecalciferol (VITAMIN D PO) Take by mouth.   Yes [provider]  acetaminophen (TYLENOL) 325 MG tablet Take 650 mg by mouth as needed.    [provider]  diphenhydrAMINE (BENADRYL) 25 MG tablet Take 25 mg by mouth every 6 (six) hours as needed.    [provider]  glucosamine-chondroitin 500-400 MG tablet Take 1 tablet by mouth 3 (three) times daily.    [provider]  HYDROcodone-acetaminophen (NORCO/VICODIN) 5-325 MG tablet Take 1 tablet by mouth every 6 (six) hours as needed for moderate pain. 09/24/15   Lattie Haw, MD  VITAMIN D, ERGOCALCIFEROL, PO Take by mouth.    [provider]    ALLERGY: Allergies  Allergen Reactions  . Aspirin   . Penicillins      ROS: Review of Systems  Constitutional: Negative.   HENT: Negative.   Eyes: Negative.   Cardiovascular: Negative.   Gastrointestinal: Negative.   Genitourinary: Negative.   Musculoskeletal: Positive for falls.       + Left shoulder pain   Skin: Negative.   Neurological: Positive for headaches. Negative for dizziness, tingling, tremors, sensory change, speech change, focal weakness, seizures and loss of consciousness.  Endo/Heme/Allergies: Bruises/bleeds easily.    Vitals:   03/28/17 2129 03/29/17 0002  BP: (!) 176/66 (!) 162/73  Pulse: 65 70  Resp: 16 18  Temp: 98 F (36.7 C)    General appearance: WDWN, NAD, Resting comfortable. LUE in sling Eyes: PERRL, Fundoscopic: normal Cardiovascular: Regular rate and rhythm without murmurs, rubs, gallops. No edema or variciosities. Distal pulses normal. Pulmonary: Clear to auscultation Musculoskeletal:     Muscle tone upper extremities: Normal    Muscle tone lower extremities: Normal    Motor exam: Upper Extremities Deltoid Bicep Tricep Grip  Right 5/5 5/5 5/5 5/5  Left In sling In sling In sling 5/5   Lower Extremity IP Quad PF DF EHL  Right 5/5 5/5 5/5 5/5 5/5  Left 5/5 5/5 5/5 5/5 5/5   Neurological Awake, alert, oriented Memory and concentration grossly intact Speech fluent, appropriate CNII: Visual fields normal CNIII/IV/VI: EOMI CNV: Facial sensation normal CNVII: Symmetric,  normal strength CNVIII: Grossly normal CNIX: Normal palate movement CNXI: Trap and SCM strength normal CN XII: Tongue protrusion normal Sensation grossly intact to LT DTR: Normal Coordination (finger/nose & heel/shin): Normal  IMAGING: CT HEAD IMPRESSION: 1. Findings suspicious for a very small subarachnoid hemorrhage involving the medial right frontal lobe along the falx. Close follow-up recommended. 2. Moderate age-related atrophy and chronic microvascular ischemic changes. 3. No acute/traumatic cervical spine pathology.  LEFT  SHOULDER IMPRESSION: No acute fracture deformity or dislocation. Widened AC joint space seen on single view, recommend correlation with point tenderness. Osteopenia, decreasing sensitivity for acute nondisplaced fractures.  IMPRESSION/PLAN  76 y.o. female with small, traumatic SAH. She is neurologically intact and without deficits. Will admit for observation. - Neuro exam q 1 hour - Seizure prophylaxis with Keppra 500mg  Bid x7days - CT scan in 12-24 hours, sooner as indicated   AC Joint space widening: In sling. No ortho recs except supportive care. [facility]   Hypokalemia: Mild at 3.3. Repeat in 12-24 hours.

## 2017-03-29 NOTE — ED Notes (Signed)
[facility], Lequita HaltMorgan.  Family at bedside and is aware of this transfer.

## 2017-03-29 NOTE — ED Provider Notes (Signed)
1:05 AM [facility] She is status post a mechanical fall off 2 steps of her deck this evening. CT with findings concerning for small subarachnoid hemorrhage of the right frontal lobe. Neurosurgery previously consulted. They plan to admit for observation. Will notify neurosurgery of patient's arrival. Patient c/o left shoulder pain with movement. She does report a mild headache as well. She is well appearing overall, in NAD.  1:15 AM Neurosurgery physician assistant aware of patient's arrival. He will see the patient in the ED and place admission orders.   Antony MaduraHumes, Ledon Weihe, PA-C 03/29/17 0118    Dione BoozeGlick, David, MD 03/29/17 [phone]

## 2018-01-05 ENCOUNTER — Other Ambulatory Visit: Payer: Self-pay | Admitting: Orthopedic Surgery

## 2018-01-09 NOTE — Patient Instructions (Addendum)
Connie Oconnor  01/09/2018   Your procedure is scheduled on: 01/11/2018   Report to [facility] Main  Entrance  Report to admitting at    0630 AM   Call this number if you have problems the morning of surgery [phone]   Remember: Do not eat food or drink liquids :After Midnight.     Take these medicines the morning of surgery with A SIP OF WATER: none                                 You may not have any metal on your body including hair pins and              piercings  Do not wear jewelry, make-up, lotions, powders or perfumes, deodorant             Do not wear nail polish.  Do not shave  48 hours prior to surgery.     Do not bring valuables to the hospital. [facility] IS NOT             RESPONSIBLE   FOR VALUABLES.  Contacts, dentures or bridgework may not be worn into surgery. .     Patients discharged the day of surgery will not be allowed to drive home.  Name and phone number of your driver: daughter Connie Oconnor cell [phone]               Please read over the following fact sheets you were given: _____________________________________________________________________             [facility] - Preparing for Surgery Before surgery, you can play an important role.  Because skin is not sterile, your skin needs to be as free of germs as possible.  You can reduce the number of germs on your skin by washing with CHG (chlorahexidine gluconate) soap before surgery.  CHG is an antiseptic cleaner which kills germs and bonds with the skin to continue killing germs even after washing. Please DO NOT use if you have an allergy to CHG or antibacterial soaps.  If your skin becomes reddened/irritated stop using the CHG and inform your nurse when you arrive at Short Stay. Do not shave (including legs and underarms) for at least 48 hours prior to the first CHG shower.  You may shave your face/neck. Please follow these instructions carefully:  1.  Shower with  CHG Soap the night before surgery and the  morning of Surgery.  2.  If you choose to wash your hair, wash your hair first as usual with your  normal  shampoo.  3.  After you shampoo, rinse your hair and body thoroughly to remove the  shampoo.                           4.  Use CHG as you would any other liquid soap.  You can apply chg directly  to the skin and wash                       Gently with a scrungie or clean washcloth.  5.  Apply the CHG Soap to your body ONLY FROM THE NECK DOWN.   Do not use on face/ open  Wound or open sores. Avoid contact with eyes, ears mouth and genitals (private parts).                       Wash face,  Genitals (private parts) with your normal soap.             6.  Wash thoroughly, paying special attention to the area where your surgery  will be performed.  7.  Thoroughly rinse your body with warm water from the neck down.  8.  DO NOT shower/wash with your normal soap after using and rinsing off  the CHG Soap.                9.  Pat yourself dry with a clean towel.            10.  Wear clean pajamas.            11.  Place clean sheets on your bed the night of your first shower and do not  sleep with pets. Day of Surgery : Do not apply any lotions/deodorants the morning of surgery.  Please wear clean clothes to the hospital/surgery center.  FAILURE TO FOLLOW THESE INSTRUCTIONS MAY RESULT IN THE CANCELLATION OF YOUR SURGERY PATIENT SIGNATURE_________________________________  NURSE SIGNATURE__________________________________  ________________________________________________________________________   Connie Oconnor  An incentive spirometer is a tool that can help keep your lungs clear and active. This tool measures how well you are filling your lungs with each breath. Taking long deep breaths may help reverse or decrease the chance of developing breathing (pulmonary) problems (especially infection) following:  A long period of  time when you are unable to move or be active. BEFORE THE PROCEDURE   If the spirometer includes an indicator to show your best effort, your nurse or respiratory therapist will set it to a desired goal.  If possible, sit up straight or lean slightly forward. Try not to slouch.  Hold the incentive spirometer in an upright position. INSTRUCTIONS FOR USE  1. Sit on the edge of your bed if possible, or sit up as far as you can in bed or on a chair. 2. Hold the incentive spirometer in an upright position. 3. Breathe out normally. 4. Place the mouthpiece in your mouth and seal your lips tightly around it. 5. Breathe in slowly and as deeply as possible, raising the piston or the ball toward the top of the column. 6. Hold your breath for 3-5 seconds or for as long as possible. Allow the piston or ball to fall to the bottom of the column. 7. Remove the mouthpiece from your mouth and breathe out normally. 8. Rest for a few seconds and repeat Steps 1 through 7 at least 10 times every 1-2 hours when you are awake. Take your time and take a few normal breaths between deep breaths. 9. The spirometer may include an indicator to show your best effort. Use the indicator as a goal to work toward during each repetition. 10. After each set of 10 deep breaths, practice coughing to be sure your lungs are clear. If you have an incision (the cut made at the time of surgery), support your incision when coughing by placing a pillow or rolled up towels firmly against it. Once you are able to get out of bed, walk around indoors and cough well. You may stop using the incentive spirometer when instructed by your caregiver.  RISKS AND COMPLICATIONS  Take your time so you do not get  dizzy or Oconnor-headed.  If you are in pain, you may need to take or ask for pain medication before doing incentive spirometry. It is harder to take a deep breath if you are having pain. AFTER USE  Rest and breathe slowly and easily.  It can  be helpful to keep track of a log of your progress. Your caregiver can provide you with a simple table to help with this. If you are using the spirometer at home, follow these instructions: [place] IF:   You are having difficultly using the spirometer.  You have trouble using the spirometer as often as instructed.  Your pain medication is not giving enough relief while using the spirometer.  You develop fever of 100.5 F (38.1 C) or higher. SEEK IMMEDIATE MEDICAL CARE IF:   You cough up bloody sputum that had not been present before.  You develop fever of 102 F (38.9 C) or greater.  You develop worsening pain at or near the incision site. MAKE SURE YOU:   Understand these instructions.  Will watch your condition.  Will get help right away if you are not doing well or get worse. Document Released: 02/28/2007 Document Revised: 01/10/2012 Document Reviewed: 05/01/2007 [facility] Patient Information 2014 [place], [place].   ________________________________________________________________________

## 2018-01-10 ENCOUNTER — Other Ambulatory Visit: Payer: Self-pay

## 2018-01-10 ENCOUNTER — Encounter (HOSPITAL_COMMUNITY): Payer: Self-pay

## 2018-01-10 ENCOUNTER — Encounter (HOSPITAL_COMMUNITY)
Admission: RE | Admit: 2018-01-10 | Discharge: 2018-01-10 | Disposition: A | Discharge status: Home / Self Care | Payer: Medicare Other | Source: Ambulatory Visit | Attending: Orthopedic Surgery | Admitting: Orthopedic Surgery

## 2018-01-10 DIAGNOSIS — I493 Ventricular premature depolarization: Secondary | ICD-10-CM | POA: Diagnosis not present

## 2018-01-10 DIAGNOSIS — S83241A Other tear of medial meniscus, current injury, right knee, initial encounter: Secondary | ICD-10-CM | POA: Diagnosis present

## 2018-01-10 DIAGNOSIS — M84361A Stress fracture, right tibia, initial encounter for fracture: Secondary | ICD-10-CM | POA: Diagnosis not present

## 2018-01-10 DIAGNOSIS — M6751 Plica syndrome, right knee: Secondary | ICD-10-CM | POA: Diagnosis not present

## 2018-01-10 DIAGNOSIS — Z880 Allergy status to penicillin: Secondary | ICD-10-CM | POA: Diagnosis not present

## 2018-01-10 DIAGNOSIS — E039 Hypothyroidism, unspecified: Secondary | ICD-10-CM | POA: Diagnosis not present

## 2018-01-10 DIAGNOSIS — Z8249 Family history of ischemic heart disease and other diseases of the circulatory system: Secondary | ICD-10-CM | POA: Diagnosis not present

## 2018-01-10 DIAGNOSIS — X58XXXA Exposure to other specified factors, initial encounter: Secondary | ICD-10-CM | POA: Diagnosis not present

## 2018-01-10 DIAGNOSIS — M1990 Unspecified osteoarthritis, unspecified site: Secondary | ICD-10-CM | POA: Diagnosis not present

## 2018-01-10 DIAGNOSIS — G43909 Migraine, unspecified, not intractable, without status migrainosus: Secondary | ICD-10-CM | POA: Diagnosis not present

## 2018-01-10 DIAGNOSIS — Y939 Activity, unspecified: Secondary | ICD-10-CM | POA: Diagnosis not present

## 2018-01-10 DIAGNOSIS — D649 Anemia, unspecified: Secondary | ICD-10-CM | POA: Diagnosis not present

## 2018-01-10 DIAGNOSIS — M2241 Chondromalacia patellae, right knee: Secondary | ICD-10-CM | POA: Diagnosis not present

## 2018-01-10 DIAGNOSIS — Z79899 Other long term (current) drug therapy: Secondary | ICD-10-CM | POA: Diagnosis not present

## 2018-01-10 DIAGNOSIS — Z886 Allergy status to analgesic agent status: Secondary | ICD-10-CM | POA: Diagnosis not present

## 2018-01-10 DIAGNOSIS — F17210 Nicotine dependence, cigarettes, uncomplicated: Secondary | ICD-10-CM | POA: Diagnosis not present

## 2018-01-10 DIAGNOSIS — Z823 Family history of stroke: Secondary | ICD-10-CM | POA: Diagnosis not present

## 2018-01-10 DIAGNOSIS — Z90710 Acquired absence of both cervix and uterus: Secondary | ICD-10-CM | POA: Diagnosis not present

## 2018-01-10 HISTORY — DX: Anemia, unspecified: D64.9

## 2018-01-10 HISTORY — DX: Pneumonia, unspecified organism: J18.9

## 2018-01-10 HISTORY — DX: Unspecified dislocation of right knee, initial encounter: S83.104A

## 2018-01-10 HISTORY — DX: Hypothyroidism, unspecified: E03.9

## 2018-01-10 HISTORY — DX: Headache: R51

## 2018-01-10 HISTORY — DX: Ventricular premature depolarization: I49.3

## 2018-01-10 HISTORY — DX: Unspecified dislocation of right knee, initial encounter: S83.241A

## 2018-01-10 HISTORY — DX: Headache, unspecified: R51.9

## 2018-01-10 LAB — CBC
HCT: 42.5 % (ref 36.0–46.0)
Hemoglobin: 13.8 g/dL (ref 12.0–15.0)
MCH: 28.5 pg (ref 26.0–34.0)
MCHC: 32.5 g/dL (ref 30.0–36.0)
MCV: 87.6 fL (ref 78.0–100.0)
PLATELETS: 270 10*3/uL (ref 150–400)
RBC: 4.85 MIL/uL (ref 3.87–5.11)
RDW: 13.5 % (ref 11.5–15.5)
WBC: 5.3 10*3/uL (ref 4.0–10.5)

## 2018-01-10 LAB — BASIC METABOLIC PANEL
Anion gap: 10 (ref 5–15)
BUN: 10 mg/dL (ref 6–20)
CHLORIDE: 101 mmol/L (ref 101–111)
CO2: 29 mmol/L (ref 22–32)
Calcium: 9.2 mg/dL (ref 8.9–10.3)
Creatinine, Ser: 0.94 mg/dL (ref 0.44–1.00)
GFR calc Af Amer: >60 mL/min (ref >60)
GFR calc non Af Amer: 58 mL/min — ABNORMAL LOW (ref >60)
Glucose, Bld: 112 mg/dL — ABNORMAL HIGH (ref 65–99)
POTASSIUM: 3.7 mmol/L (ref 3.5–5.1)
SODIUM: 140 mmol/L (ref 135–145)

## 2018-01-11 ENCOUNTER — Encounter (HOSPITAL_COMMUNITY): Payer: Self-pay

## 2018-01-11 ENCOUNTER — Ambulatory Visit (HOSPITAL_COMMUNITY): Payer: Medicare Other

## 2018-01-11 ENCOUNTER — Ambulatory Visit (HOSPITAL_COMMUNITY): Payer: Medicare Other | Admitting: Certified Registered Nurse Anesthetist

## 2018-01-11 ENCOUNTER — Ambulatory Visit (HOSPITAL_COMMUNITY)
Admission: RE | Admit: 2018-01-11 | Discharge: 2018-01-11 | Disposition: A | Discharge status: Home / Self Care | Payer: Medicare Other | Source: Ambulatory Visit | Attending: Orthopedic Surgery | Admitting: Orthopedic Surgery

## 2018-01-11 ENCOUNTER — Encounter (HOSPITAL_COMMUNITY)
Admission: RE | Disposition: A | Discharge status: Home / Self Care | Payer: Self-pay | Source: Ambulatory Visit | Attending: Orthopedic Surgery

## 2018-01-11 DIAGNOSIS — F17210 Nicotine dependence, cigarettes, uncomplicated: Secondary | ICD-10-CM | POA: Insufficient documentation

## 2018-01-11 DIAGNOSIS — M84361A Stress fracture, right tibia, initial encounter for fracture: Secondary | ICD-10-CM | POA: Insufficient documentation

## 2018-01-11 DIAGNOSIS — Y939 Activity, unspecified: Secondary | ICD-10-CM | POA: Insufficient documentation

## 2018-01-11 DIAGNOSIS — M6751 Plica syndrome, right knee: Secondary | ICD-10-CM | POA: Diagnosis not present

## 2018-01-11 DIAGNOSIS — Z90710 Acquired absence of both cervix and uterus: Secondary | ICD-10-CM | POA: Insufficient documentation

## 2018-01-11 DIAGNOSIS — M1990 Unspecified osteoarthritis, unspecified site: Secondary | ICD-10-CM | POA: Insufficient documentation

## 2018-01-11 DIAGNOSIS — S83241A Other tear of medial meniscus, current injury, right knee, initial encounter: Secondary | ICD-10-CM | POA: Diagnosis not present

## 2018-01-11 DIAGNOSIS — D649 Anemia, unspecified: Secondary | ICD-10-CM | POA: Insufficient documentation

## 2018-01-11 DIAGNOSIS — S82131A Displaced fracture of medial condyle of right tibia, initial encounter for closed fracture: Secondary | ICD-10-CM | POA: Diagnosis present

## 2018-01-11 DIAGNOSIS — Z823 Family history of stroke: Secondary | ICD-10-CM | POA: Insufficient documentation

## 2018-01-11 DIAGNOSIS — Z8249 Family history of ischemic heart disease and other diseases of the circulatory system: Secondary | ICD-10-CM | POA: Insufficient documentation

## 2018-01-11 DIAGNOSIS — I493 Ventricular premature depolarization: Secondary | ICD-10-CM | POA: Insufficient documentation

## 2018-01-11 DIAGNOSIS — M2241 Chondromalacia patellae, right knee: Secondary | ICD-10-CM | POA: Insufficient documentation

## 2018-01-11 DIAGNOSIS — Z880 Allergy status to penicillin: Secondary | ICD-10-CM | POA: Insufficient documentation

## 2018-01-11 DIAGNOSIS — Z79899 Other long term (current) drug therapy: Secondary | ICD-10-CM | POA: Insufficient documentation

## 2018-01-11 DIAGNOSIS — X58XXXA Exposure to other specified factors, initial encounter: Secondary | ICD-10-CM | POA: Insufficient documentation

## 2018-01-11 DIAGNOSIS — Z886 Allergy status to analgesic agent status: Secondary | ICD-10-CM | POA: Insufficient documentation

## 2018-01-11 DIAGNOSIS — E039 Hypothyroidism, unspecified: Secondary | ICD-10-CM | POA: Insufficient documentation

## 2018-01-11 DIAGNOSIS — G43909 Migraine, unspecified, not intractable, without status migrainosus: Secondary | ICD-10-CM | POA: Insufficient documentation

## 2018-01-11 HISTORY — PX: KNEE ARTHROSCOPY WITH SUBCHONDROPLASTY: SHX6732

## 2018-01-11 SURGERY — ARTHROSCOPY, KNEE, WITH SUBCHONDROPLASTY
Anesthesia: General | Site: Knee | Laterality: Right

## 2018-01-11 MED ORDER — EPHEDRINE 5 MG/ML INJ
INTRAVENOUS | Status: AC
  Filled 2018-01-11: qty 10

## 2018-01-11 MED ORDER — PHENYLEPHRINE 40 MCG/ML (10ML) SYRINGE FOR IV PUSH (FOR BLOOD PRESSURE SUPPORT)
PREFILLED_SYRINGE | INTRAVENOUS | Status: AC
  Filled 2018-01-11: qty 10

## 2018-01-11 MED ORDER — EPHEDRINE SULFATE 50 MG/ML IJ SOLN
INTRAMUSCULAR | Status: DC | PRN
  Administered 2018-01-11: 15 mg via INTRAVENOUS
  Administered 2018-01-11: 10 mg via INTRAVENOUS
  Administered 2018-01-11: 5 mg via INTRAVENOUS
  Administered 2018-01-11 (×2): 10 mg via INTRAVENOUS
  Administered 2018-01-11: 5 mg via INTRAVENOUS

## 2018-01-11 MED ORDER — LACTATED RINGERS IV SOLN
INTRAVENOUS | Status: DC
  Administered 2018-01-11 (×2): via INTRAVENOUS

## 2018-01-11 MED ORDER — DEXAMETHASONE SODIUM PHOSPHATE 10 MG/ML IJ SOLN
INTRAMUSCULAR | Status: AC
  Filled 2018-01-11: qty 1

## 2018-01-11 MED ORDER — EPINEPHRINE PF 1 MG/ML IJ SOLN
INTRAMUSCULAR | Status: AC
  Filled 2018-01-11: qty 2

## 2018-01-11 MED ORDER — PROPOFOL 10 MG/ML IV BOLUS
INTRAVENOUS | Status: DC | PRN
  Administered 2018-01-11: 120 mg via INTRAVENOUS

## 2018-01-11 MED ORDER — LIDOCAINE HCL (CARDIAC) 20 MG/ML IV SOLN
INTRAVENOUS | Status: DC | PRN
  Administered 2018-01-11: 60 mg via INTRATRACHEAL

## 2018-01-11 MED ORDER — ONDANSETRON HCL 4 MG/2ML IJ SOLN
INTRAMUSCULAR | Status: DC | PRN
  Administered 2018-01-11: 4 mg via INTRAVENOUS

## 2018-01-11 MED ORDER — METOPROLOL TARTRATE 5 MG/5ML IV SOLN
INTRAVENOUS | Status: AC
  Filled 2018-01-11: qty 5

## 2018-01-11 MED ORDER — GLYCOPYRROLATE 0.2 MG/ML IJ SOLN
INTRAMUSCULAR | Status: DC | PRN
  Administered 2018-01-11: 0.1 mg via INTRAVENOUS

## 2018-01-11 MED ORDER — GLYCOPYRROLATE 0.2 MG/ML IV SOSY
PREFILLED_SYRINGE | INTRAVENOUS | Status: AC
  Filled 2018-01-11: qty 3

## 2018-01-11 MED ORDER — LIDOCAINE 2% (20 MG/ML) 5 ML SYRINGE
INTRAMUSCULAR | Status: AC
  Filled 2018-01-11: qty 5

## 2018-01-11 MED ORDER — BUPIVACAINE HCL (PF) 0.5 % IJ SOLN
INTRAMUSCULAR | Status: AC
  Filled 2018-01-11: qty 30

## 2018-01-11 MED ORDER — OXYCODONE-ACETAMINOPHEN 5-325 MG PO TABS: 1.0000 | ORAL_TABLET | Freq: Four times a day (QID) | ORAL | 0 refills | Status: DC | PRN

## 2018-01-11 MED ORDER — FENTANYL CITRATE (PF) 100 MCG/2ML IJ SOLN: 25.0000 ug | INTRAMUSCULAR | Status: DC | PRN

## 2018-01-11 MED ORDER — METOPROLOL TARTRATE 5 MG/5ML IV SOLN
INTRAVENOUS | Status: DC | PRN
  Administered 2018-01-11: 2 mg via INTRAVENOUS

## 2018-01-11 MED ORDER — OXYCODONE-ACETAMINOPHEN 5-325 MG PO TABS
1.0000 | ORAL_TABLET | Freq: Once | ORAL | Status: AC | PRN
  Administered 2018-01-11: 1 via ORAL

## 2018-01-11 MED ORDER — ACETAMINOPHEN 500 MG PO TABS
1000.0000 mg | ORAL_TABLET | Freq: Once | ORAL | Status: AC
  Administered 2018-01-11: 1000 mg via ORAL
  Filled 2018-01-11: qty 2

## 2018-01-11 MED ORDER — PHENYLEPHRINE HCL 10 MG/ML IJ SOLN
INTRAMUSCULAR | Status: DC | PRN
  Administered 2018-01-11: 80 ug via INTRAVENOUS
  Administered 2018-01-11: 40 ug via INTRAVENOUS
  Administered 2018-01-11 (×2): 80 ug via INTRAVENOUS

## 2018-01-11 MED ORDER — BUPIVACAINE HCL (PF) 0.5 % IJ SOLN
INTRAMUSCULAR | Status: DC | PRN
  Administered 2018-01-11: 20 mL

## 2018-01-11 MED ORDER — ONDANSETRON HCL 4 MG/2ML IJ SOLN
INTRAMUSCULAR | Status: AC
  Filled 2018-01-11: qty 2

## 2018-01-11 MED ORDER — FENTANYL CITRATE (PF) 100 MCG/2ML IJ SOLN
INTRAMUSCULAR | Status: AC
  Filled 2018-01-11: qty 2

## 2018-01-11 MED ORDER — ONDANSETRON HCL 4 MG/2ML IJ SOLN: 4.0000 mg | Freq: Once | INTRAMUSCULAR | Status: DC | PRN

## 2018-01-11 MED ORDER — FENTANYL CITRATE (PF) 100 MCG/2ML IJ SOLN
INTRAMUSCULAR | Status: DC | PRN
  Administered 2018-01-11: 75 ug via INTRAVENOUS
  Administered 2018-01-11: 50 ug via INTRAVENOUS
  Administered 2018-01-11: 25 ug via INTRAVENOUS
  Administered 2018-01-11: 50 ug via INTRAVENOUS

## 2018-01-11 MED ORDER — PROPOFOL 10 MG/ML IV BOLUS
INTRAVENOUS | Status: AC
  Filled 2018-01-11: qty 20

## 2018-01-11 MED ORDER — VANCOMYCIN HCL IN DEXTROSE 1-5 GM/200ML-% IV SOLN
1000.0000 mg | INTRAVENOUS | Status: AC
  Administered 2018-01-11: 1000 mg via INTRAVENOUS
  Filled 2018-01-11: qty 200

## 2018-01-11 MED ORDER — EPINEPHRINE PF 1 MG/ML IJ SOLN
INTRAMUSCULAR | Status: DC | PRN
  Administered 2018-01-11: 1 mg

## 2018-01-11 MED ORDER — CHLORHEXIDINE GLUCONATE 4 % EX LIQD: 60.0000 mL | Freq: Once | CUTANEOUS | Status: DC

## 2018-01-11 MED ORDER — DEXAMETHASONE SODIUM PHOSPHATE 10 MG/ML IJ SOLN
INTRAMUSCULAR | Status: DC | PRN
  Administered 2018-01-11: 10 mg via INTRAVENOUS

## 2018-01-11 MED ORDER — OXYCODONE-ACETAMINOPHEN 5-325 MG PO TABS
ORAL_TABLET | ORAL | Status: AC
  Filled 2018-01-11: qty 1

## 2018-01-11 SURGICAL SUPPLY — 37 items
BANDAGE ACE 6X5 VEL STRL LF (GAUZE/BANDAGES/DRESSINGS) ×3 IMPLANT
BANDAGE ESMARK 6X9 LF (GAUZE/BANDAGES/DRESSINGS) ×1 IMPLANT
BLADE 4.2CUDA (BLADE) ×3 IMPLANT
BLADE CUDA 5.5 (BLADE) IMPLANT
BLADE CUTTER GATOR 3.5 (BLADE) IMPLANT
BLADE GREAT WHITE 4.2 (BLADE) IMPLANT
BLADE GREAT WHITE 4.2MM (BLADE)
BLADE SURG SZ11 CARB STEEL (BLADE) IMPLANT
BNDG CMPR 9X6 STRL LF SNTH (GAUZE/BANDAGES/DRESSINGS) ×1
BNDG ESMARK 6X9 LF (GAUZE/BANDAGES/DRESSINGS) ×3
BNDG GAUZE ELAST 4 BULKY (GAUZE/BANDAGES/DRESSINGS) ×3 IMPLANT
BOOTIES KNEE HIGH SLOAN (MISCELLANEOUS) ×3 IMPLANT
COVER SURGICAL LIGHT HANDLE (MISCELLANEOUS) ×6 IMPLANT
DRAPE C-ARM 42X120 X-RAY (DRAPES) ×2 IMPLANT
DRAPE C-ARMOR (DRAPES) ×2 IMPLANT
DRAPE SHEET LG 3/4 BI-LAMINATE (DRAPES) ×3 IMPLANT
DRAPE U-SHAPE 47X51 STRL (DRAPES) ×10 IMPLANT
DRSG ADAPTIC 3X8 NADH LF (GAUZE/BANDAGES/DRESSINGS) ×2 IMPLANT
DRSG EMULSION OIL 3X3 NADH (GAUZE/BANDAGES/DRESSINGS) ×3 IMPLANT
DRSG PAD ABDOMINAL 8X10 ST (GAUZE/BANDAGES/DRESSINGS) ×3 IMPLANT
DURAPREP 26ML APPLICATOR (WOUND CARE) ×3 IMPLANT
GAUZE SPONGE 4X4 12PLY STRL (GAUZE/BANDAGES/DRESSINGS) ×3 IMPLANT
GLOVE ECLIPSE 7.5 STRL STRAW (GLOVE) ×6 IMPLANT
IV LACTATED RINGER IRRG 3000ML (IV SOLUTION)
IV LR IRRIG 3000ML ARTHROMATIC (IV SOLUTION) ×5 IMPLANT
KIT BASIN OR (CUSTOM PROCEDURE TRAY) IMPLANT
KIT MIXER ACCUMIX (KITS) ×2 IMPLANT
KNEE KIT SCP W/SIDE ACCUPORT (Joint) ×3 IMPLANT
MANIFOLD NEPTUNE II (INSTRUMENTS) ×3 IMPLANT
PACK ARTHROSCOPY WL (CUSTOM PROCEDURE TRAY) ×3 IMPLANT
PACK ICE MAXI GEL EZY WRAP (MISCELLANEOUS) ×3 IMPLANT
PAD ABD 8X10 STRL (GAUZE/BANDAGES/DRESSINGS) ×3 IMPLANT
PADDING CAST COTTON 6X4 STRL (CAST SUPPLIES) ×3 IMPLANT
POSITIONER SURGICAL ARM (MISCELLANEOUS) ×2 IMPLANT
SUT ETHILON 4 0 PS 2 18 (SUTURE) ×3 IMPLANT
TUBING ARTHRO INFLOW-ONLY STRL (TUBING) ×3 IMPLANT
WRAP KNEE MAXI GEL POST OP (GAUZE/BANDAGES/DRESSINGS) ×3 IMPLANT

## 2018-01-11 NOTE — Discharge Instructions (Signed)
POST-OP KNEE ARTHROSCOPY INSTRUCTIONS  °Dr. John Graves/Jim Dennisse Swader PA-C ° °Pain °You will be expected to have a moderate amount of pain in the affected knee for approximately two weeks. However, the first two days will be the most severe pain. A prescription has been provided to take as needed for the pain. The pain can be reduced by applying ice packs to the knee for the first 1-2 weeks post surgery. Also, keeping the leg elevated on pillows will help alleviate the pain. If you develop any acute pain or swelling in your calf muscle, please call the doctor. ° °Activity °It is preferred that you stay at bed rest for approximately 24 hours. However, you may go to the bathroom with help. Weight bearing as tolerated. You may begin the knee exercises the day of surgery. Discontinue crutches as the knee pain resolves. ° °Dressing °Keep the dressing dry. If the ace bandage should wrinkle or roll up, this can be rewrapped to prevent ridges in the bandage. You may remove all dressings in 48 hours,  apply bandaids to each wound. You may shower on the 4th day after surgery but no tub bath. ° °Symptoms to report to your doctor °Extreme pain °Extreme swelling °Temperature above 101 degrees °Change in the feeling, color, or movement of your toes °Redness, heat, or swelling at your incision ° °Exercise °If is preferred that as soon as possible you try to do a straight leg raise without bending the knee and concentrate on bringing the heel of your foot off the bed up to approximately 45 degrees and hold for the count of 10 seconds. Repeat this at least 10 times three or four times per day. Additional exercises are provided below. ° °You are encouraged to bend the knee as tolerated. ° °Follow-Up °Call to schedule a follow-up appointment in 5-7 days. Our office # is [phone]. ° °POST-OP EXERCISES ° °Short Arc Quads ° °1. Lie on back with legs straight. Place towel roll under thigh, just above knee. °2. Tighten thigh muscles to  straighten knee and lift heel off bed. °3. Hold for slow count of five, then lower. °4. Do three sets of ten ° ° ° °Straight Leg Raises ° °1. Lie on back with operative leg straight and other leg bent. °2. Keeping operative leg completely straight, slowly lift operative leg so foot is 5 inches off bed. °3. Hold for slow count of five, then lower. °4. Do three sets of ten. ° ° ° °DO BOTH EXERCISES 2 TIMES A DAY ° °Ankle Pumps ° °Work/move the operative ankle and foot up and down 10 times every hour while awake. °

## 2018-01-11 NOTE — H&P (Signed)
PREOPERATIVE H&P  Chief Complaint: Right knee pain  HPI: Connie Oconnor is a 77 y.o. female who presents for evaluation of right knee pain. It has been present for several months and has been worsening. She has failed conservative measures. Pain is rated as moderate.  Past Medical History:  Diagnosis Date  . Anemia    hx of  . Arthritis   . Dislocation of right knee with medial meniscus tear   . Headache    hx of migraines none since 1990's  . Hypothyroidism    1978, no problems since  . Pneumonia 1990's none since  . PVC (premature ventricular contraction)    199'0's none since   Past Surgical History:  Procedure Laterality Date  . BACK SURGERY    . CERVICAL LAMINECTOMY  1986  . DILATION AND CURETTAGE OF UTERUS  1970  . ELBOW SURGERY Right 1993  . LUMBAR LAMINECTOMY  1993   rods placed bone generated removed one year later   . MULTIPLE TOOTH EXTRACTIONS    . SHOULDER SURGERY Left    arthritis clean out and shortened collar bone dr Renae Fickle  . TONSILLECTOMY  1970  . VAGINAL HYSTERECTOMY  1971   partial dr Dewaine Conger  . WISDOM TOOTH EXTRACTION     Social History   Socioeconomic History  . Marital status: Widowed    Spouse name: None  . Number of children: None  . Years of education: None  . Highest education level: None  Social Needs  . Financial resource strain: None  . Food insecurity - worry: None  . Food insecurity - inability: None  . Transportation needs - medical: None  . Transportation needs - non-medical: None  Occupational History  . None  Tobacco Use  . Smoking status: Current Every Day Smoker    Packs/day: 1.00    Years: 30.00    Pack years: 30.00    Types: Cigarettes  . Smokeless tobacco: Never Used  Substance and Sexual Activity  . Alcohol use: No  . Drug use: No  . Sexual activity: None  Other Topics Concern  . None  Social History Narrative  . None   Family History  Problem Relation Age of Onset  . Stroke Mother   . Heart attack Father     Allergies  Allergen Reactions  . Aspirin Other (See Comments)    shaking  . Penicillins Rash and Other (See Comments)    Has patient had a PCN reaction causing immediate rash, facial/tongue/throat swelling, SOB or lightheadedness with hypotension: No Has patient had a PCN reaction causing severe rash involving mucus membranes or skin necrosis: No Has patient had a PCN reaction that required hospitalization: Yes - was in hospital Has patient had a PCN reaction occurring within the last 10 years: No If all of the above answers are "NO", then may proceed with Cephalosporin use.    Prior to Admission medications   Medication Sig Start Date End Date Taking? Authorizing Provider  acetaminophen (TYLENOL) 500 MG tablet Take 1,000 mg by mouth every 6 (six) hours as needed for mild pain or headache.    Yes [provider]  Calcium Carb-Cholecalciferol (CALCIUM 600/VITAMIN D3 PO) Take 1 tablet by mouth at bedtime.   Yes [provider]  Cholecalciferol (VITAMIN D) 2000 units CAPS Take 2,000 Units by mouth at bedtime.    Yes [provider]  cyclobenzaprine (FLEXERIL) 10 MG tablet Take 10 mg by mouth 3 (three) times daily as needed for muscle spasms.  Yes [provider]  diphenhydrAMINE (BENADRYL) 25 MG tablet Take 25 mg by mouth at bedtime.    Yes [provider]  glucosamine-chondroitin 500-400 MG tablet Take 1 tablet by mouth at bedtime.    Yes [provider]  traMADol (ULTRAM) 50 MG tablet Take 50 mg by mouth every 8 (eight) hours as needed for moderate pain.   Yes [provider]  HYDROcodone-acetaminophen (NORCO/VICODIN) 5-325 MG tablet Take 1 tablet by mouth every 6 (six) hours as needed for moderate pain. Patient not taking: Reported on 01/06/2018 09/24/15   Lattie HawBeese, Stephen A, MD  levETIRAcetam (KEPPRA) 500 MG tablet Take 1 tablet (500 mg total) by mouth 2 (two) times daily. Patient not taking: Reported on 01/06/2018 03/29/17 01/06/18   Alyson Inglesostella, Vincent J, PA-C     Positive ROS: None  All other systems have been reviewed and were otherwise negative with the exception of those mentioned in the HPI and as above.  Physical Exam: Vitals:   01/11/18 0640 01/11/18 0641  BP:  (!) 153/69  Pulse: 73   Resp: 14   Temp: 98 F (36.7 C)   SpO2: 98%     General: Alert, no acute distress Cardiovascular: No pedal edema Respiratory: No cyanosis, no use of accessory musculature GI: No organomegaly, abdomen is soft and non-tender Skin: No lesions in the area of chief complaint Neurologic: Sensation intact distally Psychiatric: Patient is competent for consent with normal mood and affect Lymphatic: No axillary or cervical lymphadenopathy  MUSCULOSKELETAL: Right knee: Painful range of motion.  Limited range of motion.  Traumatic tenderness to palpation over the medial joint line.  MRI: MRI shows severe stress reaction and questionable stress fracture in the medial tibial plateau.  This is a large lesion approximately 2 x 2 cm.  Assessment/Plan: RIGHT KNEE MEDIAL MENISCUS TEAR AND STRESS FRACTURE TIBIAL PLATEAU Plan for Procedure(s): KNEE ARTHROSCOPY WITH SUBCHONDROPLASTY  The risks benefits and alternatives were discussed with the patient including but not limited to the risks of nonoperative treatment, versus surgical intervention including infection, bleeding, nerve injury, malunion, nonunion, hardware prominence, hardware failure, need for hardware removal, blood clots, cardiopulmonary complications, morbidity, mortality, among others, and they were willing to proceed.  Predicted outcome is good, although there will be at least a six to nine month expected recovery.  Harvie JuniorJohn L Janyce Ellinger, MD 01/11/2018 8:31 AM

## 2018-01-11 NOTE — Transfer of Care (Signed)
Immediate Anesthesia Transfer of Care Note  Patient: Nancy MarusRebecca Bach  Procedure(s) Performed: KNEE ARTHROSCOPY WITH SUBCHONDROPLASTY, PARTIAL MEDIAL MENISCECTOMY, CHONDROPLASTY OF THE MEDIAL PATELLA FEMORAL CHONDYL, MEDIAL PLICA  EXCISION (Right Knee)  Patient Location: PACU  Anesthesia Type:General  Level of Consciousness: awake, alert  and oriented  Airway & Oxygen Therapy: Patient Spontanous Breathing and Patient connected to nasal cannula oxygen  Post-op Assessment: Report given to RN, Post -op Vital signs reviewed and stable and Patient moving all extremities  Post vital signs: Reviewed and stable  Last Vitals:  Vitals:   01/11/18 0640 01/11/18 0641  BP:  (!) 153/69  Pulse: 73   Resp: 14   Temp: 36.7 C   SpO2: 98%     Last Pain:  Vitals:   01/11/18 0640  TempSrc: Oral         Complications: No apparent anesthesia complications

## 2018-01-11 NOTE — Brief Op Note (Signed)
01/11/2018  10:00 AM  PATIENT:  Connie Oconnor  77 y.o. female  PRE-OPERATIVE DIAGNOSIS:  RIGHT KNEE MEDIAL MENISCUS TEAR AND STRESS FRACTURE TIBIAL PLATEAU  POST-OPERATIVE DIAGNOSIS:  MENISCUS TEAR AND STRESS FRACTURE TIBIAL PLATEAU  PROCEDURE:  Procedure(s): KNEE ARTHROSCOPY WITH SUBCHONDROPLASTY, PARTIAL MEDIAL MENISCECTOMY, CHONDROPLASTY OF THE MEDIAL PATELLA FEMORAL CHONDYL, MEDIAL PLICA  EXCISION (Right)  SURGEON:  Surgeon(s) and Role:    Jodi Geralds* Jesiel Garate, MD - Primary  PHYSICIAN ASSISTANT:   ASSISTANTS: bethune   ANESTHESIA:   none  EBL:  none   BLOOD ADMINISTERED:none  DRAINS: none   LOCAL MEDICATIONS USED:  MARCAINE     SPECIMEN:  No Specimen  DISPOSITION OF SPECIMEN:  N/A  COUNTS:  YES  TOURNIQUET:  * No tourniquets in log *  DICTATION: .Other Dictation: Dictation Number [id number]  PLAN OF CARE: Discharge to home after PACU  PATIENT DISPOSITION:  PACU - hemodynamically stable.   Delay start of Pharmacological VTE agent (>24hrs) due to surgical blood loss or risk of bleeding: no

## 2018-01-11 NOTE — Anesthesia Postprocedure Evaluation (Signed)
Anesthesia Post Note  Patient: Connie MarusRebecca Sarr  Procedure(s) Performed: KNEE ARTHROSCOPY WITH SUBCHONDROPLASTY, PARTIAL MEDIAL MENISCECTOMY, CHONDROPLASTY OF THE MEDIAL PATELLA FEMORAL CHONDYL, MEDIAL PLICA  EXCISION (Right Knee)     Patient location during evaluation: PACU Anesthesia Type: General Level of consciousness: awake and alert Pain management: pain level controlled Vital Signs Assessment: post-procedure vital signs reviewed and stable Respiratory status: spontaneous breathing, nonlabored ventilation, respiratory function stable and patient connected to nasal cannula oxygen Cardiovascular status: blood pressure returned to baseline and stable Postop Assessment: no apparent nausea or vomiting Anesthetic complications: no    Last Vitals:  Vitals:   01/11/18 1115 01/11/18 1210  BP: 139/62 133/63  Pulse: 65 65  Resp: 12 16  Temp: (!) 36.3 C 36.4 C  SpO2: 94% 92%    Last Pain:  Vitals:   01/11/18 1201  TempSrc:   PainSc: 3                  Cecile HearingStephen Edward Anacaren Kohan

## 2018-01-11 NOTE — Anesthesia Preprocedure Evaluation (Addendum)
Anesthesia Evaluation  Patient identified by MRN, date of birth, ID band Patient awake    Reviewed: Allergy & Precautions, NPO status , Patient's Chart, lab work & pertinent test results  Airway Mallampati: II  TM Distance: >3 FB Neck ROM: Full    Dental  (+) Dental Advisory Given, Edentulous Lower, Edentulous Upper   Pulmonary Current Smoker,    Pulmonary exam normal breath sounds clear to auscultation       Cardiovascular Exercise Tolerance: Good negative cardio ROS Normal cardiovascular exam Rhythm:Regular Rate:Normal     Neuro/Psych  Headaches, negative psych ROS   GI/Hepatic negative GI ROS, Neg liver ROS,   Endo/Other  Hypothyroidism   Renal/GU negative Renal ROS     Musculoskeletal  (+) Arthritis , Osteoarthritis,  Dislocation of right knee, medial meniscal tear   Abdominal   Peds  Hematology negative hematology ROS (+)   Anesthesia Other Findings Day of surgery medications reviewed with the patient.  Reproductive/Obstetrics                            Anesthesia Physical Anesthesia Plan  ASA: II  Anesthesia Plan: General   Post-op Pain Management:    Induction: Intravenous  PONV Risk Score and Plan: 2 and Dexamethasone and Ondansetron  Airway Management Planned: LMA  Additional Equipment:   Intra-op Plan:   Post-operative Plan: Extubation in OR  Informed Consent: I have reviewed the patients History and Physical, chart, labs and discussed the procedure including the risks, benefits and alternatives for the proposed anesthesia with the patient or authorized representative who has indicated his/her understanding and acceptance.   Dental advisory given  Plan Discussed with: CRNA  Anesthesia Plan Comments:         Anesthesia Quick Evaluation

## 2018-01-11 NOTE — Anesthesia Procedure Notes (Signed)
Procedure Name: LMA Insertion Date/Time: 01/11/2018 8:48 AM Performed by: Elgie CongoMalinova, Shoua Ulloa H, CRNA Pre-anesthesia Checklist: Patient identified, Emergency Drugs available, Suction available and Patient being monitored Patient Re-evaluated:Patient Re-evaluated prior to induction Oxygen Delivery Method: Circle system utilized Preoxygenation: Pre-oxygenation with 100% oxygen Induction Type: IV induction LMA: LMA inserted LMA Size: 3.0 Number of attempts: 1 Placement Confirmation: positive ETCO2 and breath sounds checked- equal and bilateral Tube secured with: Tape Dental Injury: Teeth and Oropharynx as per pre-operative assessment

## 2018-01-12 NOTE — Op Note (Signed)
NAMETERRYN, Connie Oconnor             ACCOUNT NO.:  [id number]  MEDICAL RECORD NO.:  [id number]  LOCATION:                                 FACILITY:  PHYSICIAN:  Alta Corning, M.D.   DATE OF BIRTH:  1941-07-06  DATE OF PROCEDURE:  01/11/2018 DATE OF DISCHARGE:  01/11/2018                              OPERATIVE REPORT   PREOPERATIVE DIAGNOSES: 1. Medial tibial plateau stress fracture. 2. 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  POSTOPERATIVE DIAGNOSES: 1. Medial tibial plateau stress fracture. 2. 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 3. Medial plica.  PROCEDURES: 1.  treatment of tibial plateau fracture,     medial side, right with internal fixation of bone cement. 2. Arthroscopic partial medial meniscectomy, right. 3. Arthroscopic chondroplasty, medial compartment and patellofemoral     compartment, right. 4. Medial plica excision, right, arthroscopic. 5. Interpretation of multiple intraoperative fluoroscopic images.  SURGEON:  Alta Corning, M.D.  ASSISTANT:  Gary Fleet, PA.  ANESTHESIA:  General.  BRIEF HISTORY:  Connie Oconnor is a 77 year old female with a history of having sudden onset severe medial knee pain on the right side.  She had been treated conservatively with continued pain.  MRI was obtained, which showed posterior horn medial meniscal tear with significant stress reaction and stress fracture in the medial tibial plateau.  She had no narrowing on plain x-rays and we had a long discussion with she and her family about treatment options.  We felt that one option would be total knee replacement, but given that she did not have significant or really any narrowing on her standing films, we felt that arthroscopic intervention made sense.  She did have a significant medial meniscal tear as well.  Ultimately, after careful  consideration, she did wish for arthroscopic intervention with treatment of her stress fracture.  She is brought to the operating room for this procedure.  DESCRIPTION OF PROCEDURE:  The patient was brought to the operative room and after adequate anesthesia was obtained with general anesthetic, the patient was placed supine on the operating table.  The right leg was then prepped and draped in usual sterile fashion.  Following this, routine arthroscopic examination of the knee revealed that there was a classic posterior horn medial meniscal tear at the meniscal root.  This was debrided back to a smooth and stable rim and away from the capsule posteriorly.  Once this was done, attention was turned to the medial femoral condyle where there was some grade 2, maybe early grade 3 change.  There was certainly no grade 4 change.  Medial tibial plateau had some grade 1 change.  This was debrided in the medial compartment. Attention was turned to the ACL, normal.  Attention was turned to the lateral side where there was some chondromalacia of the lateral tibial plateau, which was debrided minimally.  The lateral meniscus and lateral femoral condyle within normal limits.  Attention was turned back up to the patellofemoral joint where there was significant chondromalacia, which was debrided.  There was a large medial shelf plica, which was debrided  allowing the medial compartment to be opened up significantly. At this point, attention was turned towards placement of a cannula in the medial tibial plateau.  The subchondroplasty kit was opened and the cannula was identified initially.  The entry point for the cannula was identified initially with careful planning based on imaging AP and lateral as well as measurements guided by MRI examination.  The tip of the cannula was placed at the center hole.  The cannula was placed at 2 cm from the medial plateau and 2 cm from the anterior tibia and this  was measured by MRI.  At this point, all of the syringes were mixed and filled and we then put a single syringe superiorly, a single syringe anteriorly, a single surround posteriorly and then distributed the remaining of the syringes anterior, posterior, inferior as well as superior.  At that point, for the last syringe, we did back up the cannula nadir to the edge and then put the remaining plunger in to get that placed more towards the edge.  Once that was done, we allowed the cement to harden for 10 minutes.  Once that 10-minute time had gone by, the cannula was removed.  We then put the scope back in the knee to make sure there had been no extravasation of cement in the knee and there was none.  At this point, the knee was irrigated, suctioned dry.  At this point, attention was turned towards closure.  Sterile compressive dressings were applied and 20 mL of Marcaine was instilled in the knee and around the introduction of the cement and the patient was then taken to Recovery where was noted to be in satisfactory condition.  Estimated blood loss for the procedure was minimal, and the tourniquet was put for the subchondroplasty portion and it was up for probably somewhere around 30 minutes, but the final can be gotten from the anesthetic record.     Alta Corning, M.D.     Corliss Skains  D:  01/11/2018  T:  01/11/2018  Job:  [id number]

## 2018-02-07 ENCOUNTER — Encounter: Payer: Self-pay | Admitting: Gastroenterology

## 2018-03-15 ENCOUNTER — Emergency Department (HOSPITAL_COMMUNITY)
Admission: EM | Admit: 2018-03-15 | Discharge: 2018-03-15 | Disposition: A | Discharge status: Home / Self Care | Payer: Medicare Other | Attending: Emergency Medicine | Admitting: Emergency Medicine

## 2018-03-15 ENCOUNTER — Emergency Department (HOSPITAL_COMMUNITY): Payer: Medicare Other

## 2018-03-15 ENCOUNTER — Encounter (HOSPITAL_COMMUNITY): Payer: Self-pay | Admitting: Family Medicine

## 2018-03-15 ENCOUNTER — Other Ambulatory Visit: Payer: Self-pay

## 2018-03-15 DIAGNOSIS — R1084 Generalized abdominal pain: Secondary | ICD-10-CM | POA: Diagnosis present

## 2018-03-15 DIAGNOSIS — F17210 Nicotine dependence, cigarettes, uncomplicated: Secondary | ICD-10-CM | POA: Insufficient documentation

## 2018-03-15 DIAGNOSIS — N390 Urinary tract infection, site not specified: Secondary | ICD-10-CM

## 2018-03-15 DIAGNOSIS — Z79899 Other long term (current) drug therapy: Secondary | ICD-10-CM | POA: Diagnosis not present

## 2018-03-15 DIAGNOSIS — E039 Hypothyroidism, unspecified: Secondary | ICD-10-CM | POA: Insufficient documentation

## 2018-03-15 DIAGNOSIS — K5900 Constipation, unspecified: Secondary | ICD-10-CM | POA: Diagnosis not present

## 2018-03-15 LAB — COMPREHENSIVE METABOLIC PANEL
ALBUMIN: 4.1 g/dL (ref 3.5–5.0)
ALK PHOS: 69 U/L (ref 38–126)
ALT: 17 U/L (ref 14–54)
ANION GAP: 13 (ref 5–15)
AST: 18 U/L (ref 15–41)
BILIRUBIN TOTAL: 1 mg/dL (ref 0.3–1.2)
BUN: 10 mg/dL (ref 6–20)
CALCIUM: 9.5 mg/dL (ref 8.9–10.3)
CO2: 25 mmol/L (ref 22–32)
Chloride: 99 mmol/L — ABNORMAL LOW (ref 101–111)
Creatinine, Ser: 0.92 mg/dL (ref 0.44–1.00)
GFR calc Af Amer: >60 mL/min (ref >60)
GFR calc non Af Amer: 59 mL/min — ABNORMAL LOW (ref >60)
GLUCOSE: 113 mg/dL — AB (ref 65–99)
POTASSIUM: 4.1 mmol/L (ref 3.5–5.1)
SODIUM: 137 mmol/L (ref 135–145)
Total Protein: 7.7 g/dL (ref 6.5–8.1)

## 2018-03-15 LAB — URINALYSIS, ROUTINE W REFLEX MICROSCOPIC
Bilirubin Urine: NEGATIVE
Glucose, UA: NEGATIVE mg/dL
KETONES UR: 5 mg/dL — AB
Nitrite: NEGATIVE
PROTEIN: 30 mg/dL — AB
Specific Gravity, Urine: 1.019 (ref 1.005–1.030)
pH: 6 (ref 5.0–8.0)

## 2018-03-15 LAB — CBC
HEMATOCRIT: 46.9 % — AB (ref 36.0–46.0)
HEMOGLOBIN: 15.4 g/dL — AB (ref 12.0–15.0)
MCH: 28.2 pg (ref 26.0–34.0)
MCHC: 32.8 g/dL (ref 30.0–36.0)
MCV: 85.9 fL (ref 78.0–100.0)
Platelets: 139 10*3/uL — ABNORMAL LOW (ref 150–400)
RBC: 5.46 MIL/uL — ABNORMAL HIGH (ref 3.87–5.11)
RDW: 13.3 % (ref 11.5–15.5)
WBC: 5.9 10*3/uL (ref 4.0–10.5)

## 2018-03-15 LAB — LIPASE, BLOOD: Lipase: 22 U/L (ref 11–51)

## 2018-03-15 IMAGING — CT CT ABD-PELV W/ CM
2 of 5 series · 16 of 46 positions shown, 18 images · IV contrast (ISOVUE)
Comparison: None.

CLINICAL DATA: Abdominal pain, nausea, vomiting, and constipation.

EXAM:
CT ABDOMEN AND PELVIS WITH CONTRAST
TECHNIQUE: Multidetector CT imaging of the abdomen and pelvis was performed
using the standard protocol following bolus administration of
intravenous contrast.
CONTRAST:  100mL [YR] IOPAMIDOL ([YR]) INJECTION 61%

[Series 2: axial st · axial · 0.88mm/px · z∈[+1190,+1560]mm · 13 of 86 slices shown, 15 images]
[im 6/86  soft-tissue]
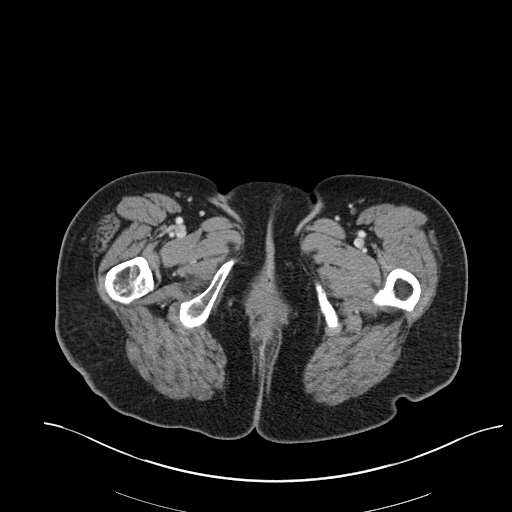
[im 6/86  bone]
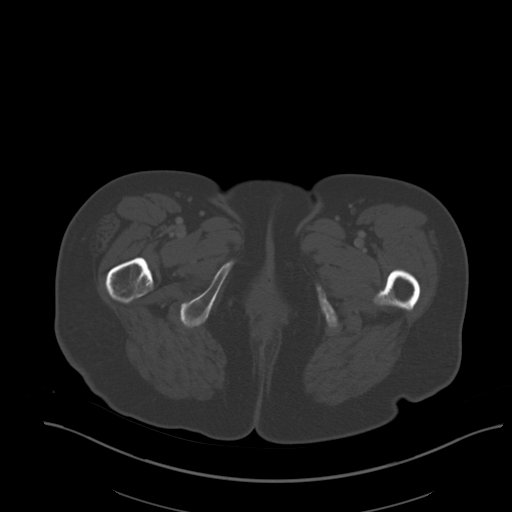
[im 11/86  soft-tissue]
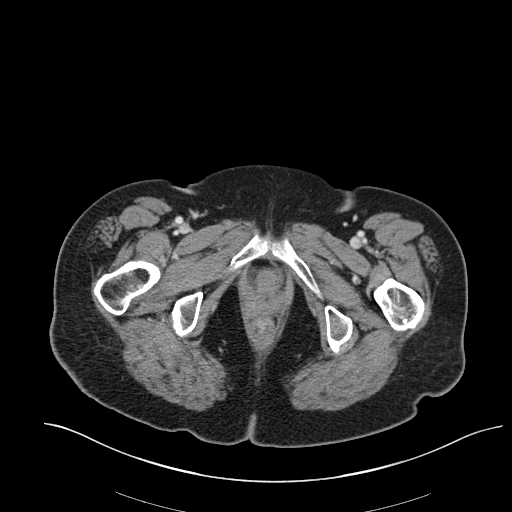
[im 16/86  soft-tissue]
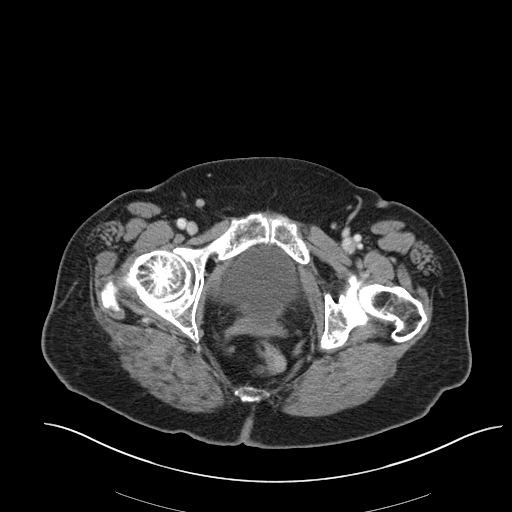
[im 27/86  soft-tissue]
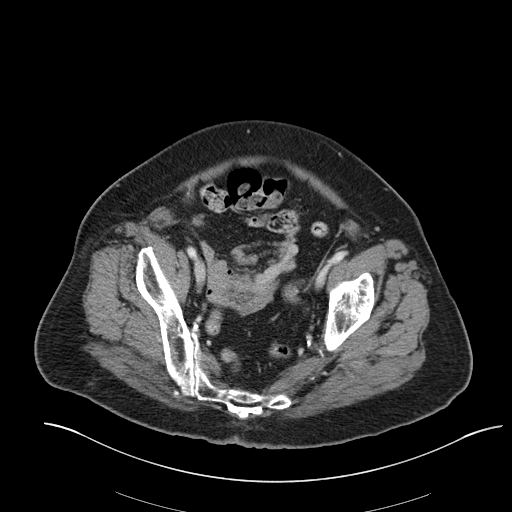
[im 32/86  soft-tissue]
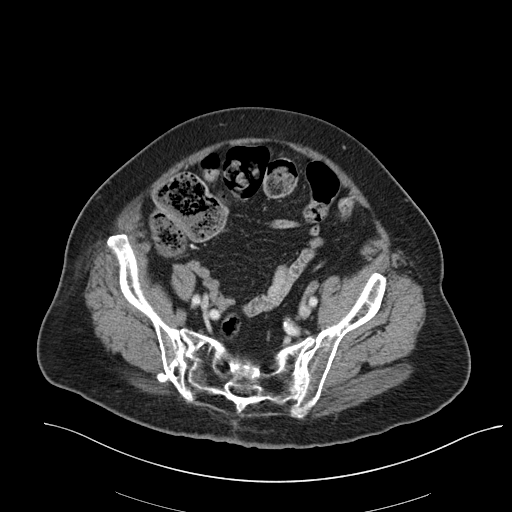
[im 38/86  soft-tissue]
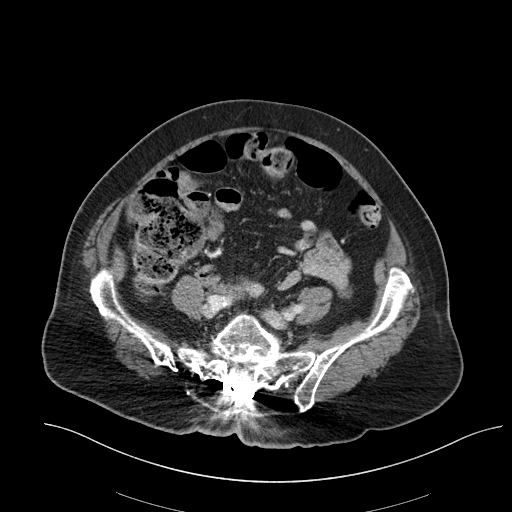
[im 43/86  soft-tissue]
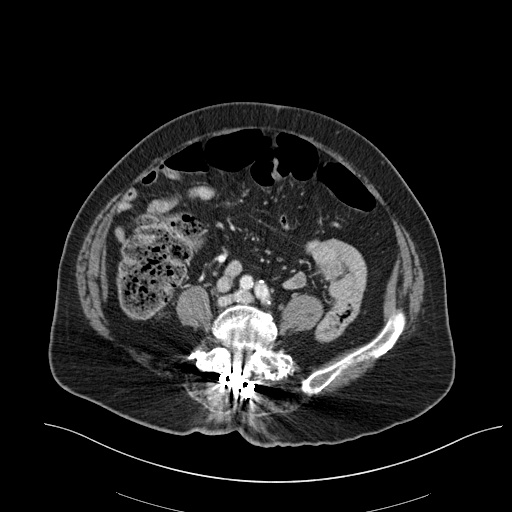
[im 48/86  soft-tissue]
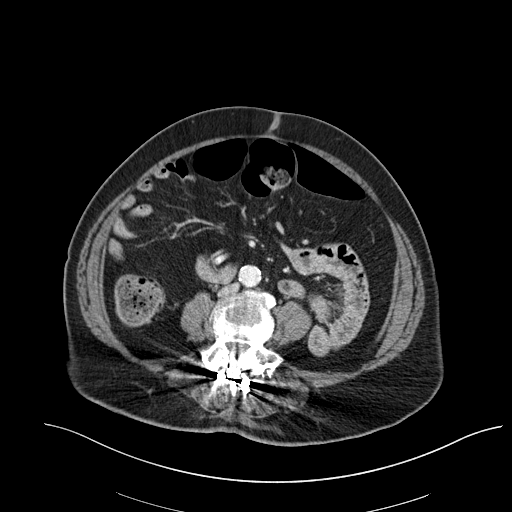
[im 54/86  soft-tissue]
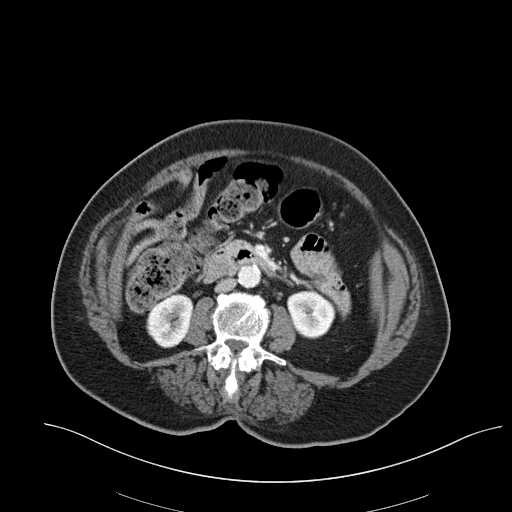
[im 54/86  bone]
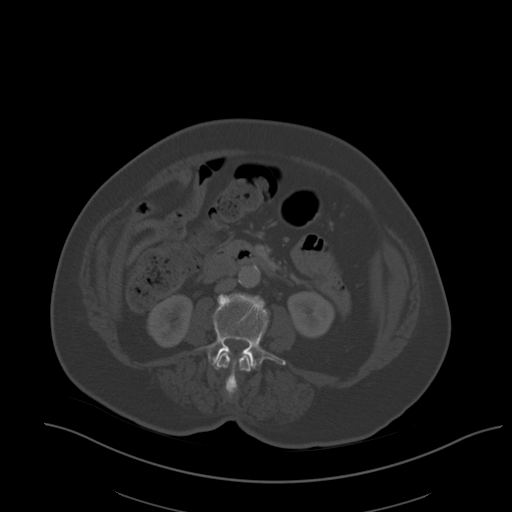
[im 59/86  soft-tissue]
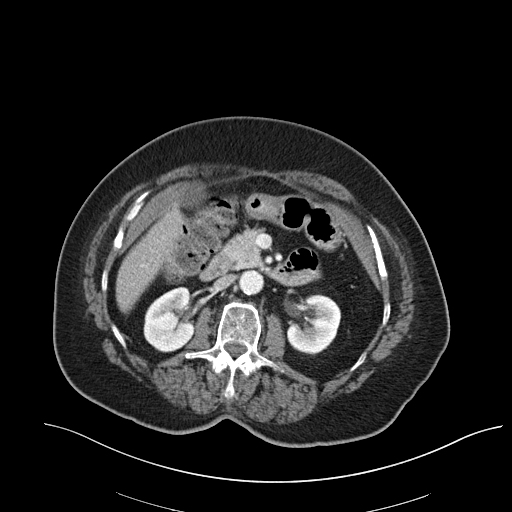
[im 70/86  soft-tissue]
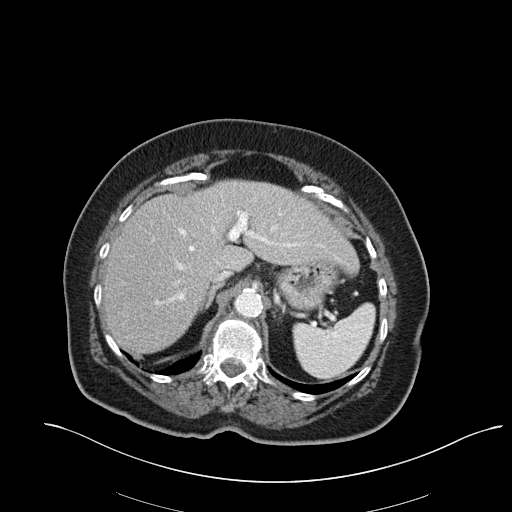
[im 75/86  soft-tissue]
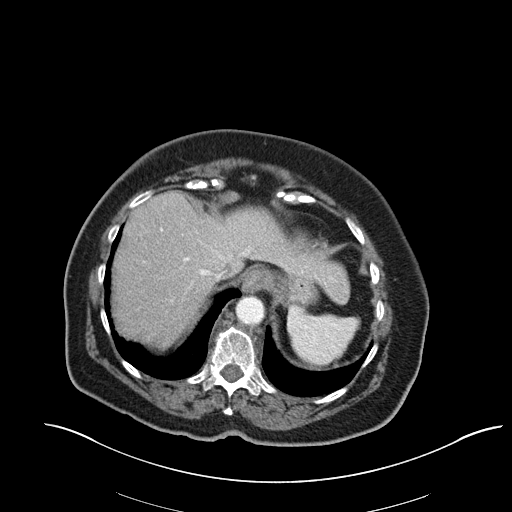
[im 80/86  soft-tissue]
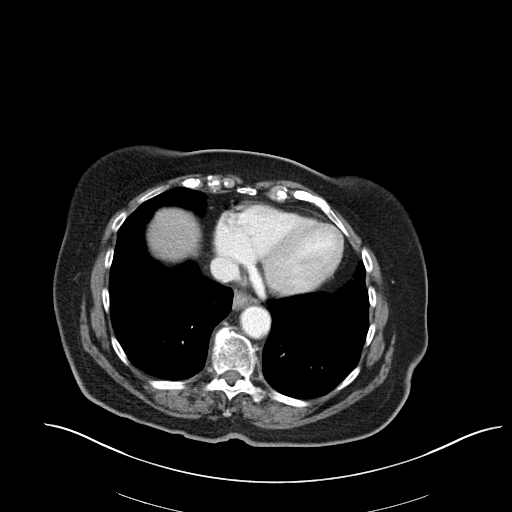

[Series 4: coronal st · coronal · 0.81mm/px · 3 of 110 slices shown]
[im 37/110  soft-tissue]
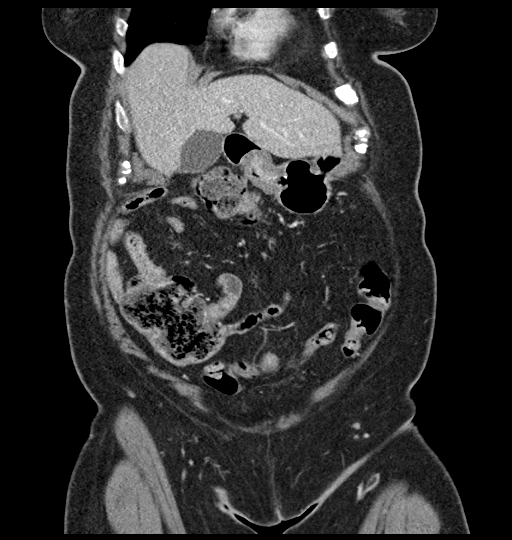
[im 49/110  soft-tissue]
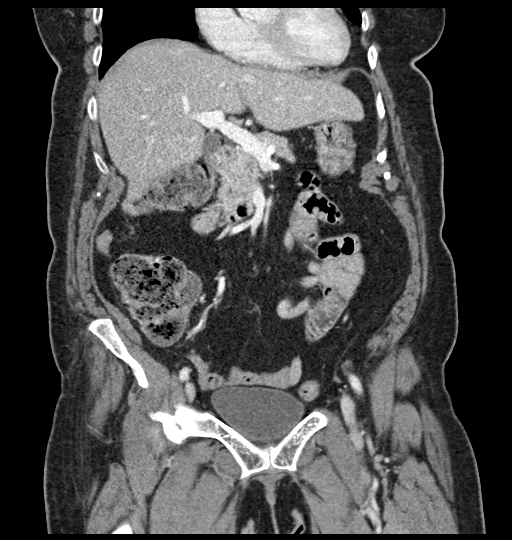
[im 61/110  soft-tissue]
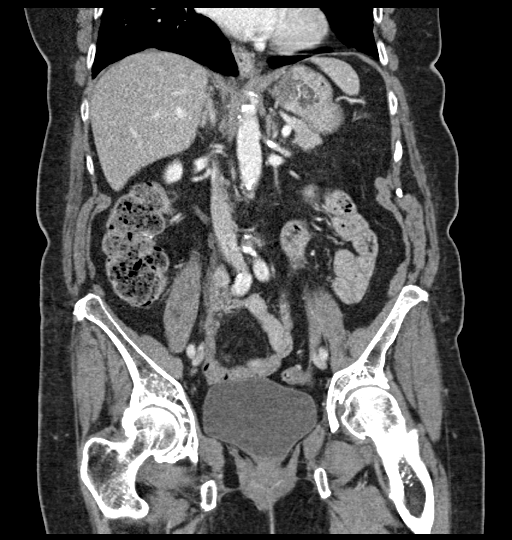

[16 of 46 positions shown; findings below may reference images not displayed]

FINDINGS: Lower chest: 3 mm subpleural nodule in the right middle lobe (series
8, image 8). Minimal bibasilar atelectasis. No pleural effusion.
Normal heart size.

Hepatobiliary: Suspected hepatic steatosis. 10 mm cyst in the
lateral segment of the left hepatic lobe. Unremarkable gallbladder.
No biliary dilatation.

Pancreas: Unremarkable.

Spleen: Unremarkable.

Adrenals/Urinary Tract: Unremarkable adrenal glands. No evidence of
renal mass, calculi, or hydronephrosis. Unremarkable bladder.

Stomach/Bowel: The stomach is within normal limits. There is no
evidence of bowel obstruction. A moderate amount of stool is present
in the colon from the cecum to the mid transverse colon. The
descending and sigmoid colon are collapsed. The appendix is not
clearly identified, however no inflammatory changes are seen in the
right lower quadrant to indicate appendicitis.

Vascular/Lymphatic: Abdominal aortic atherosclerosis without
aneurysm. No enlarged lymph nodes.

Reproductive: Status post hysterectomy. No adnexal masses.

Other: No intraperitoneal free fluid.  No abdominal wall hernia.

Musculoskeletal: L4-S1 posterior fusion. L5-S1 interbody ankylosis.
L4 superior endplate compression fracture, likely chronic. Age
indeterminate T12 superior endplate compression fracture with 45%
height loss and mild retropulsion resulting in mild spinal stenosis.
IMPRESSION: 1. No bowel obstruction or other acute abnormality identified in the
abdomen or pelvis.
2. Moderate proximal colonic stool burden.
3. Age indeterminate T12 compression fracture.
4. 3 mm right middle lobe nodule. No follow-up needed if patient is
low-risk. Non-contrast chest CT can be considered in 12 months if
patient is high-risk. This recommendation follows the consensus
statement: Guidelines for Management of Incidental Pulmonary Nodules
Detected on CT Images: From the [HOSPITAL] [YR]; Radiology

## 2018-03-15 MED ORDER — CEPHALEXIN 500 MG PO CAPS: 500.0000 mg | ORAL_CAPSULE | Freq: Four times a day (QID) | ORAL | 0 refills | Status: DC

## 2018-03-15 MED ORDER — IOPAMIDOL (ISOVUE-300) INJECTION 61%
100.0000 mL | Freq: Once | INTRAVENOUS | Status: AC | PRN
  Administered 2018-03-15: 100 mL via INTRAVENOUS

## 2018-03-15 MED ORDER — SODIUM CHLORIDE 0.9 % IV SOLN
1.0000 g | Freq: Once | INTRAVENOUS | Status: AC
  Administered 2018-03-15: 1 g via INTRAVENOUS
  Filled 2018-03-15: qty 10

## 2018-03-15 MED ORDER — ONDANSETRON HCL 4 MG/2ML IJ SOLN
4.0000 mg | Freq: Once | INTRAMUSCULAR | Status: AC
  Administered 2018-03-15: 4 mg via INTRAVENOUS
  Filled 2018-03-15: qty 2

## 2018-03-15 MED ORDER — SODIUM CHLORIDE 0.9 % IV BOLUS
1000.0000 mL | Freq: Once | INTRAVENOUS | Status: AC
  Administered 2018-03-15: 1000 mL via INTRAVENOUS

## 2018-03-15 NOTE — ED Triage Notes (Signed)
Patient has seen [employer]. PA today for abd pain and constipation. She is here for further evaluation and to rule out small bowel obstruction. Patient reports she has been taking Tramadol for her arthritis pain and relates taking these medications to her constipation. Patient reports she has not a bowel movement in over a week. Also, experiencing back pain, nausea, and vomiting. Able to urinate with no complications. Unable to pass gas since last night.

## 2018-03-15 NOTE — Discharge Instructions (Signed)
You have a urinary tract infection.  Increase fluids.  Prescription for antibiotic.  Additionally, you are constipated.  Recommend MiraLAX, milk of magnesia, magnesium citrate.

## 2018-03-17 LAB — URINE CULTURE: CULTURE: NO GROWTH

## 2018-03-18 NOTE — ED Provider Notes (Signed)
[facility]-EMERGENCY DEPT Provider Note   CSN: [id number] Arrival date & time: 03/15/18  1717     History   Chief Complaint Chief Complaint  Patient presents with  . Abdominal Pain    HPI Connie Oconnor is a 77 y.o. female.  Patient reports generalized abdominal pain for several days with no bowel movement for 1 week.  She is presently on chronic pain medicine for complications of knee surgery and remote back surgery.  She reports some nausea and vomiting, but no fever, sweats, chills, chest pain, dyspnea.  Severity of symptoms is mild to moderate.  Nothing makes symptoms better or worse.     Past Medical History:  Diagnosis Date  . Anemia    hx of  . Arthritis   . Dislocation of right knee with medial meniscus tear   . Headache    hx of migraines none since 1990's  . Hypothyroidism    1978, no problems since  . Pneumonia 1990's none since  . PVC (premature ventricular contraction)    199'0's none since    Patient Active Problem List   Diagnosis Date Noted  . Closed fracture of medial portion of right tibial plateau 01/11/2018  . Acute medial meniscal tear, right, initial encounter 01/11/2018  . SAH (subarachnoid hemorrhage) (HCC) 03/29/2017    Past Surgical History:  Procedure Laterality Date  . BACK SURGERY    . CERVICAL LAMINECTOMY  1986  . DILATION AND CURETTAGE OF UTERUS  1970  . ELBOW SURGERY Right 1993  . KNEE ARTHROSCOPY WITH SUBCHONDROPLASTY Right 01/11/2018   Procedure: KNEE ARTHROSCOPY WITH SUBCHONDROPLASTY, PARTIAL MEDIAL MENISCECTOMY, CHONDROPLASTY OF THE MEDIAL PATELLA FEMORAL CHONDYL, MEDIAL PLICA  EXCISION;  Surgeon: Jodi Geralds, MD;  Location: [facility];  Service: Orthopedics;  Laterality: Right;  . LUMBAR LAMINECTOMY  1993   rods placed bone generated removed one year later   . MULTIPLE TOOTH EXTRACTIONS    . SHOULDER SURGERY Left    arthritis clean out and shortened collar bone dr Renae Fickle  . TONSILLECTOMY  1970  . VAGINAL  HYSTERECTOMY  1971   partial dr Dewaine Conger  . WISDOM TOOTH EXTRACTION       OB History   None      Home Medications    Prior to Admission medications   Medication Sig Start Date End Date Taking? Authorizing Provider  acetaminophen (TYLENOL) 500 MG tablet Take 1,000 mg by mouth every 6 (six) hours as needed for mild pain or headache.    Yes [provider]  cyclobenzaprine (FLEXERIL) 10 MG tablet Take 10 mg by mouth 3 (three) times daily as needed for muscle spasms.   Yes [provider]  diphenhydrAMINE (BENADRYL) 25 MG tablet Take 25 mg by mouth at bedtime as needed for allergies.    Yes [provider]  glucosamine-chondroitin 500-400 MG tablet Take 1 tablet by mouth at bedtime.    Yes [provider]  oxyCODONE-acetaminophen (PERCOCET/ROXICET) 5-325 MG tablet Take 1-2 tablets by mouth every 6 (six) hours as needed for severe pain. 01/11/18  Yes Marshia Ly, PA-C  traMADol (ULTRAM) 50 MG tablet Take 50 mg by mouth every 8 (eight) hours as needed for moderate pain.   Yes [provider]  cephALEXin (KEFLEX) 500 MG capsule Take 1 capsule (500 mg total) by mouth 4 (four) times daily. 03/15/18   Donnetta Hutching, MD    Family History Family History  Problem Relation Age of Onset  . Stroke Mother   . Heart  attack Father     Social History Social History   Tobacco Use  . Smoking status: Current Every Day Smoker    Packs/day: 1.00    Years: 30.00    Pack years: 30.00    Types: Cigarettes  . Smokeless tobacco: Never Used  Substance Use Topics  . Alcohol use: No  . Drug use: No     Allergies   Aspirin; Ibuprofen; and Penicillins   Review of Systems Review of Systems  All other systems reviewed and are negative.    Physical Exam Updated Vital Signs BP (!) 171/74 (BP Location: Right Arm)   Pulse 71   Temp 97.9 F (36.6 C) (Oral)   Resp 18   Ht  (1.651 m)   Wt 75.3 kg (166 lb)   SpO2 97%   BMI 27.62 kg/m   Physical  Exam  Constitutional: She is oriented to person, place, and time. She appears well-developed and well-nourished.  nad  HENT:  Head: Normocephalic and atraumatic.  Eyes: Conjunctivae are normal.  Neck: Neck supple.  Cardiovascular: Normal rate and regular rhythm.  Pulmonary/Chest: Effort normal and breath sounds normal.  Abdominal: Soft. Bowel sounds are normal.  Minimal generalized tenderness; no acute abdomen  Musculoskeletal: Normal range of motion.  Neurological: She is alert and oriented to person, place, and time.  Skin: Skin is warm and dry.  Psychiatric: She has a normal mood and affect. Her behavior is normal.  Nursing note and vitals reviewed.    ED Treatments / Results  Labs (all labs ordered are listed, but only abnormal results are displayed) Labs Reviewed  COMPREHENSIVE METABOLIC PANEL - Abnormal; Notable for the following components:      Result Value   Chloride 99 (*)    Glucose, Bld 113 (*)    GFR calc non Af Amer 59 (*)    All other components within normal limits  CBC - Abnormal; Notable for the following components:   RBC 5.46 (*)    Hemoglobin 15.4 (*)    HCT 46.9 (*)    Platelets 139 (*)    All other components within normal limits  URINALYSIS, ROUTINE W REFLEX MICROSCOPIC - Abnormal; Notable for the following components:   APPearance CLOUDY (*)    Hgb urine dipstick SMALL (*)    Ketones, ur 5 (*)    Protein, ur 30 (*)    Leukocytes, UA LARGE (*)    Bacteria, UA RARE (*)    All other components within normal limits  URINE CULTURE  LIPASE, BLOOD    EKG None  Radiology No results found.  Procedures Procedures (including critical care time)  Medications Ordered in ED Medications  ondansetron (ZOFRAN) injection 4 mg (4 mg Intravenous Given 03/15/18 1954)  sodium chloride 0.9 % bolus 1,000 mL (0 mLs Intravenous Stopped 03/15/18 2307)  iopamidol (ISOVUE-300) 61 % injection 100 mL (100 mLs Intravenous Contrast Given 03/15/18 2030)  cefTRIAXone  (ROCEPHIN) 1 g in sodium chloride 0.9 % 100 mL IVPB (0 g Intravenous Stopped 03/15/18 2307)     Initial Impression / Assessment and Plan / ED Course  I have reviewed the triage vital signs and the nursing notes.  Pertinent labs & imaging results that were available during my care of the patient were reviewed by me and considered in my medical decision making (see chart for details).     Patient presents with generalized abdominal pain and constipation.  She is nontoxic-appearing without an acute abdomen.  Urinalysis reveals evidence of  infection.  CT shows no bowel obstruction; however there is moderate proximal colonic stool burden, age-indeterminate T12 compression fracture, and a 3 mm right middle lobe nodule.  Patient was discharged home in stable condition.  03/18/18... I spoke with the patient on the telephone.  Discussed the right middle lobe nodule and need for follow-up.  She understands and agrees.  Final Clinical Impressions(s) / ED Diagnoses   Final diagnoses:  Urinary tract infection without hematuria, site unspecified  Constipation, unspecified constipation type    ED Discharge Orders        Ordered    cephALEXin (KEFLEX) 500 MG capsule  4 times daily     03/15/18 2232       Donnetta Hutching, MD 03/19/18 [phone]

## 2019-02-20 ENCOUNTER — Encounter (HOSPITAL_COMMUNITY): Payer: Self-pay | Admitting: Emergency Medicine

## 2019-02-20 ENCOUNTER — Inpatient Hospital Stay (HOSPITAL_COMMUNITY)
Admission: EM | Admit: 2019-02-20 | Discharge: 2019-02-23 | DRG: 065 | Disposition: A | Discharge status: Rehabilitation Facility | Payer: Medicare Other | Attending: Internal Medicine | Admitting: Internal Medicine

## 2019-02-20 ENCOUNTER — Inpatient Hospital Stay (HOSPITAL_COMMUNITY): Payer: Medicare Other

## 2019-02-20 ENCOUNTER — Emergency Department (HOSPITAL_COMMUNITY): Payer: Medicare Other

## 2019-02-20 DIAGNOSIS — D62 Acute posthemorrhagic anemia: Secondary | ICD-10-CM | POA: Diagnosis present

## 2019-02-20 DIAGNOSIS — Z880 Allergy status to penicillin: Secondary | ICD-10-CM | POA: Diagnosis not present

## 2019-02-20 DIAGNOSIS — R0989 Other specified symptoms and signs involving the circulatory and respiratory systems: Secondary | ICD-10-CM | POA: Diagnosis not present

## 2019-02-20 DIAGNOSIS — M79662 Pain in left lower leg: Secondary | ICD-10-CM | POA: Diagnosis not present

## 2019-02-20 DIAGNOSIS — G8194 Hemiplegia, unspecified affecting left nondominant side: Secondary | ICD-10-CM | POA: Diagnosis present

## 2019-02-20 DIAGNOSIS — E162 Hypoglycemia, unspecified: Secondary | ICD-10-CM | POA: Diagnosis not present

## 2019-02-20 DIAGNOSIS — M160 Bilateral primary osteoarthritis of hip: Secondary | ICD-10-CM | POA: Diagnosis present

## 2019-02-20 DIAGNOSIS — M1990 Unspecified osteoarthritis, unspecified site: Secondary | ICD-10-CM | POA: Diagnosis present

## 2019-02-20 DIAGNOSIS — Z823 Family history of stroke: Secondary | ICD-10-CM | POA: Diagnosis not present

## 2019-02-20 DIAGNOSIS — N390 Urinary tract infection, site not specified: Secondary | ICD-10-CM | POA: Diagnosis not present

## 2019-02-20 DIAGNOSIS — Z886 Allergy status to analgesic agent status: Secondary | ICD-10-CM

## 2019-02-20 DIAGNOSIS — K210 Gastro-esophageal reflux disease with esophagitis: Secondary | ICD-10-CM | POA: Diagnosis present

## 2019-02-20 DIAGNOSIS — Z79890 Hormone replacement therapy (postmenopausal): Secondary | ICD-10-CM

## 2019-02-20 DIAGNOSIS — R739 Hyperglycemia, unspecified: Secondary | ICD-10-CM | POA: Diagnosis present

## 2019-02-20 DIAGNOSIS — R918 Other nonspecific abnormal finding of lung field: Secondary | ICD-10-CM | POA: Diagnosis not present

## 2019-02-20 DIAGNOSIS — I639 Cerebral infarction, unspecified: Secondary | ICD-10-CM

## 2019-02-20 DIAGNOSIS — Z8249 Family history of ischemic heart disease and other diseases of the circulatory system: Secondary | ICD-10-CM

## 2019-02-20 DIAGNOSIS — F17210 Nicotine dependence, cigarettes, uncomplicated: Secondary | ICD-10-CM | POA: Diagnosis present

## 2019-02-20 DIAGNOSIS — F0150 Vascular dementia without behavioral disturbance: Secondary | ICD-10-CM | POA: Diagnosis present

## 2019-02-20 DIAGNOSIS — R410 Disorientation, unspecified: Secondary | ICD-10-CM | POA: Diagnosis not present

## 2019-02-20 DIAGNOSIS — I10 Essential (primary) hypertension: Secondary | ICD-10-CM | POA: Diagnosis present

## 2019-02-20 DIAGNOSIS — K209 Esophagitis, unspecified: Secondary | ICD-10-CM | POA: Diagnosis not present

## 2019-02-20 DIAGNOSIS — R5383 Other fatigue: Secondary | ICD-10-CM | POA: Diagnosis not present

## 2019-02-20 DIAGNOSIS — R29701 NIHSS score 1: Secondary | ICD-10-CM | POA: Diagnosis present

## 2019-02-20 DIAGNOSIS — R531 Weakness: Secondary | ICD-10-CM | POA: Diagnosis present

## 2019-02-20 DIAGNOSIS — J439 Emphysema, unspecified: Secondary | ICD-10-CM | POA: Diagnosis not present

## 2019-02-20 DIAGNOSIS — J449 Chronic obstructive pulmonary disease, unspecified: Secondary | ICD-10-CM | POA: Diagnosis present

## 2019-02-20 DIAGNOSIS — J9601 Acute respiratory failure with hypoxia: Secondary | ICD-10-CM | POA: Diagnosis not present

## 2019-02-20 DIAGNOSIS — R471 Dysarthria and anarthria: Secondary | ICD-10-CM | POA: Diagnosis present

## 2019-02-20 DIAGNOSIS — G8114 Spastic hemiplegia affecting left nondominant side: Secondary | ICD-10-CM | POA: Diagnosis not present

## 2019-02-20 DIAGNOSIS — K257 Chronic gastric ulcer without hemorrhage or perforation: Secondary | ICD-10-CM | POA: Diagnosis not present

## 2019-02-20 DIAGNOSIS — R29810 Facial weakness: Secondary | ICD-10-CM | POA: Diagnosis present

## 2019-02-20 DIAGNOSIS — M1611 Unilateral primary osteoarthritis, right hip: Secondary | ICD-10-CM | POA: Diagnosis not present

## 2019-02-20 DIAGNOSIS — B9562 Methicillin resistant Staphylococcus aureus infection as the cause of diseases classified elsewhere: Secondary | ICD-10-CM | POA: Diagnosis not present

## 2019-02-20 DIAGNOSIS — Z66 Do not resuscitate: Secondary | ICD-10-CM | POA: Diagnosis present

## 2019-02-20 DIAGNOSIS — I6381 Other cerebral infarction due to occlusion or stenosis of small artery: Principal | ICD-10-CM | POA: Diagnosis present

## 2019-02-20 DIAGNOSIS — I671 Cerebral aneurysm, nonruptured: Secondary | ICD-10-CM

## 2019-02-20 DIAGNOSIS — G43909 Migraine, unspecified, not intractable, without status migrainosus: Secondary | ICD-10-CM | POA: Diagnosis present

## 2019-02-20 DIAGNOSIS — I82402 Acute embolism and thrombosis of unspecified deep veins of left lower extremity: Secondary | ICD-10-CM | POA: Diagnosis not present

## 2019-02-20 DIAGNOSIS — I609 Nontraumatic subarachnoid hemorrhage, unspecified: Secondary | ICD-10-CM

## 2019-02-20 DIAGNOSIS — I493 Ventricular premature depolarization: Secondary | ICD-10-CM | POA: Diagnosis present

## 2019-02-20 DIAGNOSIS — E46 Unspecified protein-calorie malnutrition: Secondary | ICD-10-CM | POA: Diagnosis present

## 2019-02-20 DIAGNOSIS — Z87820 Personal history of traumatic brain injury: Secondary | ICD-10-CM | POA: Diagnosis not present

## 2019-02-20 DIAGNOSIS — E871 Hypo-osmolality and hyponatremia: Secondary | ICD-10-CM | POA: Diagnosis present

## 2019-02-20 DIAGNOSIS — K922 Gastrointestinal hemorrhage, unspecified: Secondary | ICD-10-CM | POA: Diagnosis not present

## 2019-02-20 DIAGNOSIS — I69391 Dysphagia following cerebral infarction: Secondary | ICD-10-CM | POA: Diagnosis not present

## 2019-02-20 DIAGNOSIS — E785 Hyperlipidemia, unspecified: Secondary | ICD-10-CM | POA: Diagnosis present

## 2019-02-20 DIAGNOSIS — K5901 Slow transit constipation: Secondary | ICD-10-CM | POA: Diagnosis present

## 2019-02-20 DIAGNOSIS — K449 Diaphragmatic hernia without obstruction or gangrene: Secondary | ICD-10-CM | POA: Diagnosis present

## 2019-02-20 DIAGNOSIS — R4587 Impulsiveness: Secondary | ICD-10-CM | POA: Diagnosis present

## 2019-02-20 DIAGNOSIS — Z20828 Contact with and (suspected) exposure to other viral communicable diseases: Secondary | ICD-10-CM | POA: Diagnosis present

## 2019-02-20 DIAGNOSIS — Z720 Tobacco use: Secondary | ICD-10-CM | POA: Diagnosis not present

## 2019-02-20 DIAGNOSIS — K259 Gastric ulcer, unspecified as acute or chronic, without hemorrhage or perforation: Secondary | ICD-10-CM | POA: Diagnosis not present

## 2019-02-20 DIAGNOSIS — I724 Aneurysm of artery of lower extremity: Secondary | ICD-10-CM | POA: Diagnosis not present

## 2019-02-20 DIAGNOSIS — D72829 Elevated white blood cell count, unspecified: Secondary | ICD-10-CM | POA: Diagnosis not present

## 2019-02-20 DIAGNOSIS — I959 Hypotension, unspecified: Secondary | ICD-10-CM | POA: Diagnosis not present

## 2019-02-20 DIAGNOSIS — E039 Hypothyroidism, unspecified: Secondary | ICD-10-CM | POA: Diagnosis present

## 2019-02-20 DIAGNOSIS — I69354 Hemiplegia and hemiparesis following cerebral infarction affecting left non-dominant side: Secondary | ICD-10-CM | POA: Diagnosis present

## 2019-02-20 DIAGNOSIS — K3189 Other diseases of stomach and duodenum: Secondary | ICD-10-CM | POA: Diagnosis not present

## 2019-02-20 DIAGNOSIS — R740 Nonspecific elevation of levels of transaminase and lactic acid dehydrogenase [LDH]: Secondary | ICD-10-CM | POA: Diagnosis present

## 2019-02-20 DIAGNOSIS — F05 Delirium due to known physiological condition: Secondary | ICD-10-CM | POA: Diagnosis present

## 2019-02-20 DIAGNOSIS — I69322 Dysarthria following cerebral infarction: Secondary | ICD-10-CM | POA: Diagnosis not present

## 2019-02-20 DIAGNOSIS — K254 Chronic or unspecified gastric ulcer with hemorrhage: Secondary | ICD-10-CM | POA: Diagnosis present

## 2019-02-20 HISTORY — DX: Nontraumatic subarachnoid hemorrhage, unspecified: I60.9

## 2019-02-20 LAB — URINALYSIS, ROUTINE W REFLEX MICROSCOPIC
Bacteria, UA: NONE SEEN
Bilirubin Urine: NEGATIVE
Glucose, UA: NEGATIVE mg/dL
Hgb urine dipstick: NEGATIVE
Ketones, ur: NEGATIVE mg/dL
Nitrite: NEGATIVE
Protein, ur: NEGATIVE mg/dL
Specific Gravity, Urine: 1.008 (ref 1.005–1.030)
pH: 7 (ref 5.0–8.0)

## 2019-02-20 LAB — DIFFERENTIAL
Abs Immature Granulocytes: 0.01 10*3/uL (ref 0.00–0.07)
Basophils Absolute: 0 10*3/uL (ref 0.0–0.1)
Basophils Relative: 1 %
Eosinophils Absolute: 0.1 10*3/uL (ref 0.0–0.5)
Eosinophils Relative: 1 %
Immature Granulocytes: 0 %
Lymphocytes Relative: 11 %
Lymphs Abs: 0.8 10*3/uL (ref 0.7–4.0)
Monocytes Absolute: 0.5 10*3/uL (ref 0.1–1.0)
Monocytes Relative: 7 %
Neutro Abs: 5.5 10*3/uL (ref 1.7–7.7)
Neutrophils Relative %: 80 %

## 2019-02-20 LAB — COMPREHENSIVE METABOLIC PANEL
ALT: 15 U/L (ref 0–44)
AST: 14 U/L — ABNORMAL LOW (ref 15–41)
Albumin: 3.8 g/dL (ref 3.5–5.0)
Alkaline Phosphatase: 54 U/L (ref 38–126)
Anion gap: 12 (ref 5–15)
BUN: 8 mg/dL (ref 8–23)
CO2: 25 mmol/L (ref 22–32)
Calcium: 9.4 mg/dL (ref 8.9–10.3)
Chloride: 104 mmol/L (ref 98–111)
Creatinine, Ser: 0.89 mg/dL (ref 0.44–1.00)
GFR calc Af Amer: >60 mL/min (ref >60)
GFR calc non Af Amer: >60 mL/min (ref >60)
Glucose, Bld: 99 mg/dL (ref 70–99)
Potassium: 3.8 mmol/L (ref 3.5–5.1)
Sodium: 141 mmol/L (ref 135–145)
Total Bilirubin: 0.5 mg/dL (ref 0.3–1.2)
Total Protein: 6.3 g/dL — ABNORMAL LOW (ref 6.5–8.1)

## 2019-02-20 LAB — RAPID URINE DRUG SCREEN, HOSP PERFORMED
Amphetamines: NOT DETECTED
Barbiturates: NOT DETECTED
Benzodiazepines: NOT DETECTED
Cocaine: NOT DETECTED
Opiates: NOT DETECTED
Tetrahydrocannabinol: NOT DETECTED

## 2019-02-20 LAB — ETHANOL: Alcohol, Ethyl (B): <10 mg/dL (ref <10)

## 2019-02-20 LAB — ECHOCARDIOGRAM COMPLETE
Height: 65 in
Weight: 2480 oz

## 2019-02-20 LAB — CBC
HCT: 44.6 % (ref 36.0–46.0)
Hemoglobin: 14.1 g/dL (ref 12.0–15.0)
MCH: 27.9 pg (ref 26.0–34.0)
MCHC: 31.6 g/dL (ref 30.0–36.0)
MCV: 88.3 fL (ref 80.0–100.0)
Platelets: 291 10*3/uL (ref 150–400)
RBC: 5.05 MIL/uL (ref 3.87–5.11)
RDW: 13.1 % (ref 11.5–15.5)
WBC: 6.9 10*3/uL (ref 4.0–10.5)
nRBC: 0 % (ref 0.0–0.2)

## 2019-02-20 LAB — T4, FREE: Free T4: 0.72 ng/dL — ABNORMAL LOW (ref 0.82–1.77)

## 2019-02-20 LAB — APTT: aPTT: 28 seconds (ref 24–36)

## 2019-02-20 LAB — PROTIME-INR
INR: 1 (ref 0.8–1.2)
Prothrombin Time: 13.2 seconds (ref 11.4–15.2)

## 2019-02-20 LAB — TSH: TSH: 3.02 u[IU]/mL (ref 0.350–4.500)

## 2019-02-20 IMAGING — MR MRI HEAD WITHOUT CONTRAST
11 of 13 series · 35 of 48 positions shown · non-contrast
Comparison: CT head [DATE]

CLINICAL DATA: Stroke.  Left-sided weakness.



[Series 5: DWI · axial · 3.0mm · 0.88mm/px · z∈[-50,+86]mm · 5 of 94 slices shown (1 of 4)]
[im 1/94]
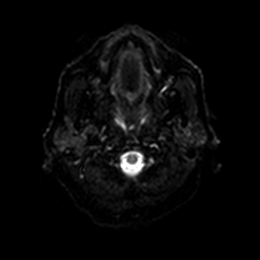
[im 24/94]
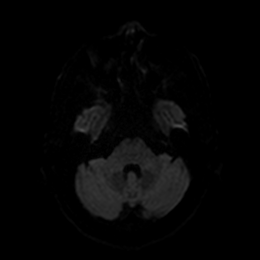
[im 47/94]
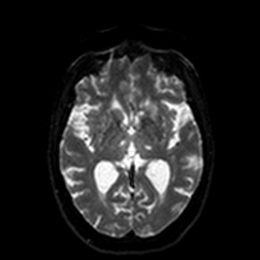
[im 70/94]
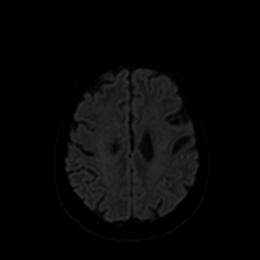
[im 94/94]
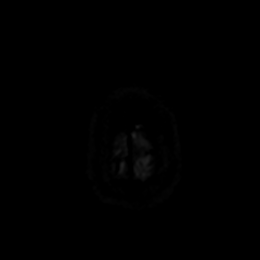

[Series 6: DWI · axial · 3.0mm · 0.88mm/px · z∈[-50,+86]mm · 2 of 47 slices shown (2 of 4)]
[im 1/47]
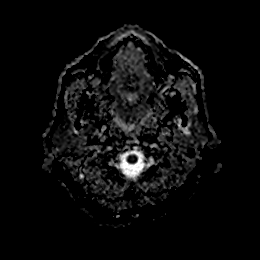
[im 47/47]
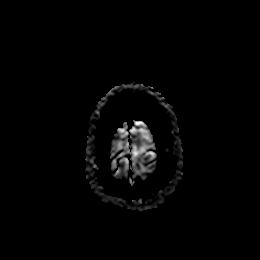

[Series 7: DWI · coronal · 4.0mm · 0.88mm/px · 3 of 64 slices shown (3 of 4)]
[im 1/64]
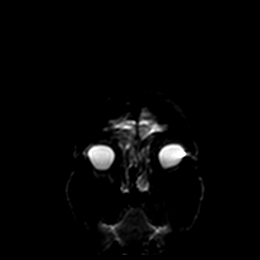
[im 32/64]
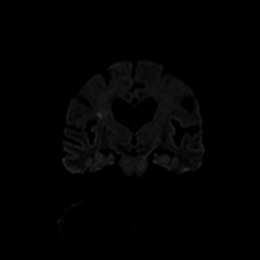
[im 64/64]
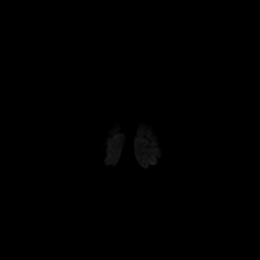

[Series 8: DWI · coronal · 4.0mm · 0.88mm/px · 2 of 32 slices shown (4 of 4)]
[im 1/32]
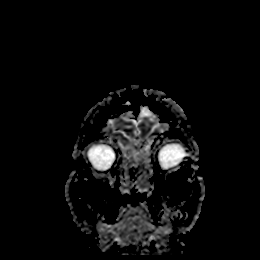
[im 32/32]
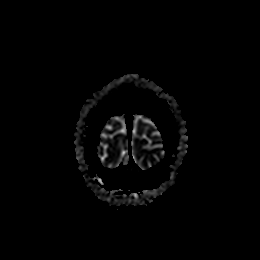

[Series 9: T1 · sagittal · 5.0mm · 0.75mm/px · 1 of 23 slices shown]
[im 1/23]
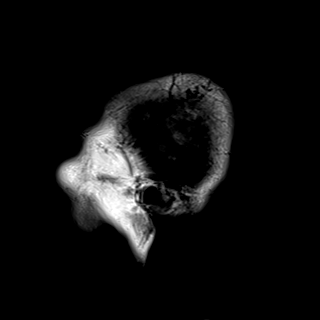

[Series 14: T2 · axial · 5.0mm · 0.72mm/px · 1 of 25 slices shown (1 of 2)]
[im 1/25]
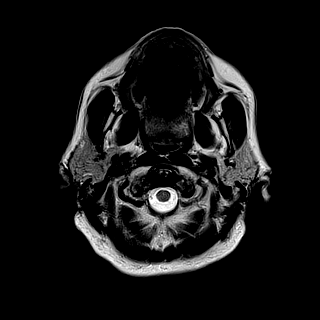

[Series 15: FLAIR · axial · 5.0mm · 0.45mm/px · 1 of 25 slices shown]
[im 1/25]
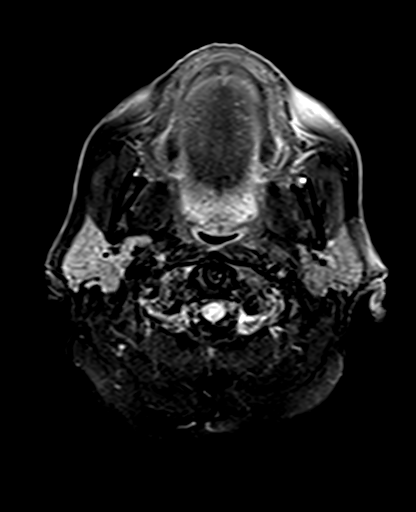

[Series 17: pha_images · axial · 3.0mm · 0.90mm/px · z∈[-55,+95]mm · 3 of 51 slices shown]
[im 1/51]
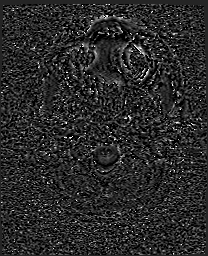
[im 26/51]
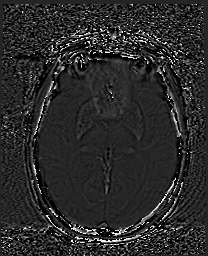
[im 51/51]
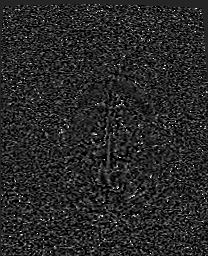

[Series 18: swi_images · axial · 3.0mm · 0.90mm/px · z∈[-55,+95]mm · 3 of 52 slices shown]
[im 1/52]
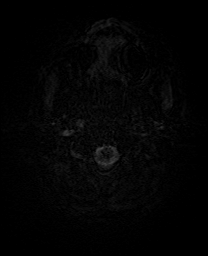
[im 26/52]
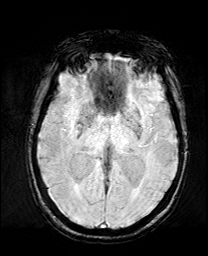
[im 52/52]
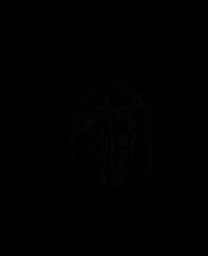

[Series 21: T2 · coronal · 5.0mm · 0.34mm/px · 1 of 29 slices shown (2 of 2)]
[im 1/29]
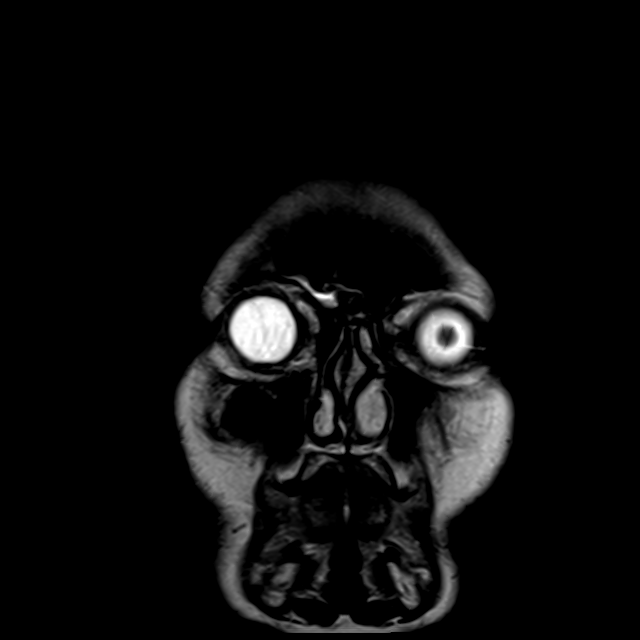

[Series 28: tof_fl3d_tra_iso · axial · 0.6mm · 0.52mm/px · z∈[-214,-69]mm · 13 of 257 slices shown]
[im 1/257]
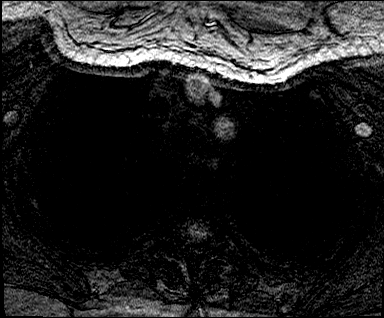
[im 22/257]
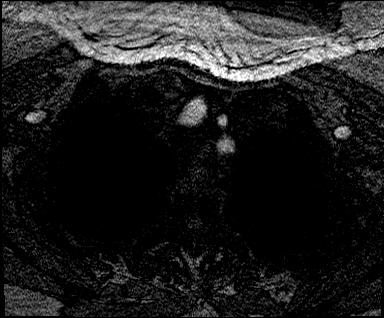
[im 43/257]
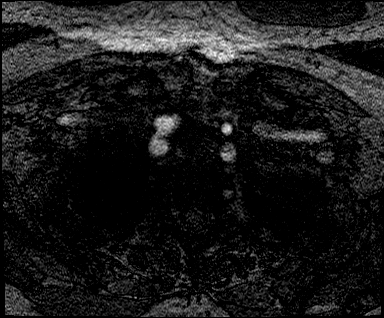
[im 65/257]
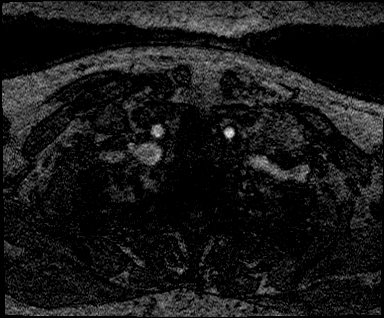
[im 86/257]
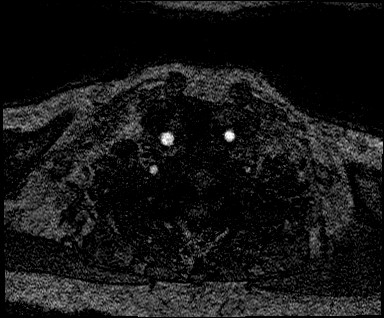
[im 107/257]
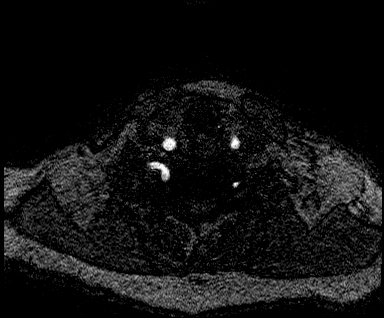
[im 129/257]
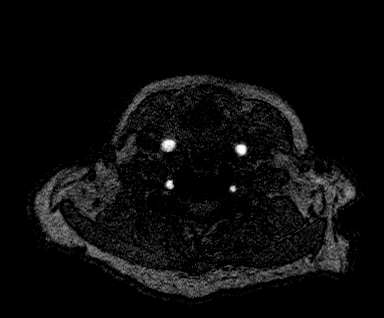
[im 150/257]
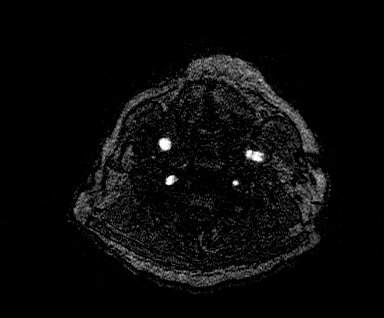
[im 171/257]
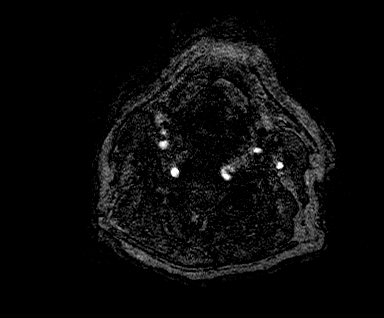
[im 193/257]
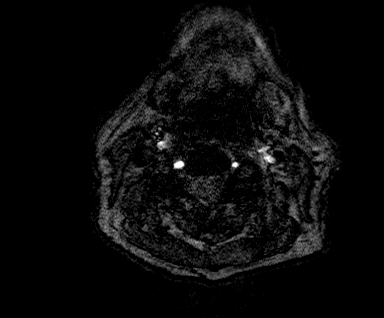
[im 214/257]
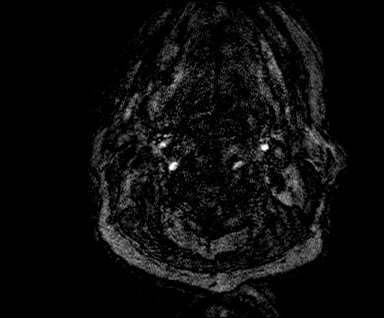
[im 235/257]
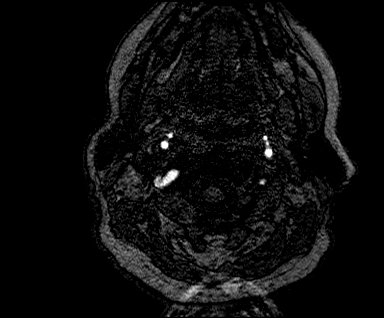
[im 257/257]
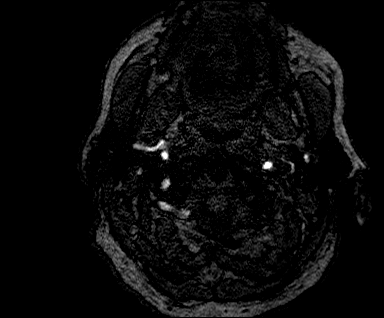

[35 of 48 positions shown; findings below may reference images not displayed]

FINDINGS: MRI HEAD FINDINGS

Brain: Small white matter infarct in the right corona radiata. No
other acute infarct identified.

Mild atrophy. Moderate to extensive chronic microvascular ischemic
changes in the white matter and pons. Chronic infarcts in the basal
ganglia and head of caudate on the right. Chronic microhemorrhage in
the high right parietal lobe. No hematoma or fluid collection or
mass lesion. No midline shift.

Vascular: Normal arterial flow voids.

Flow void right MCA bifurcation compatible with aneurysm. See MRA
findings below.

Skull and upper cervical spine: Negative

Sinuses/Orbits: Mild mucosal edema paranasal sinuses.  Normal orbit

Other: None

MRA HEAD FINDINGS

Severe stenosis distal left vertebral artery. Mild to moderate
stenosis distal right vertebral artery and mild stenosis distal left
vertebral artery. PICA patent bilaterally. Atherosclerotic basilar
without significant stenosis. Superior cerebellar arteries widely
patent bilaterally. Atherosclerotic disease with sequential moderate
stenosis ease in the left PCA. Right PCA patent.

Atherosclerotic disease right cavernous carotid with mild stenosis.
Left cavernous carotid widely patent.

Moderate stenosis A2 segment bilaterally.

Mild atherosclerotic disease left middle cerebral artery M1 and M2
segments. Mild disease in right MCA branches

7 mm aneurysm right MCA bifurcation.

MRA NECK FINDINGS

Antegrade flow in the carotid arteries and vertebral arteries
bilaterally.

Right carotid bifurcation widely patent. Mild stenosis proximal left
internal and external carotid arteries.

Both vertebral arteries patent without proximal stenosis. Left
vertebral artery dominant
IMPRESSION: Small acute infarct in the right Corona radiata.

Atrophy and moderate to advanced chronic microvascular ischemia

Negative for large vessel occlusion. Diffuse intracranial
atherosclerotic disease as above.

7 mm aneurysm right MCA bifurcation

Mild stenosis left carotid bifurcation in the neck.

## 2019-02-20 MED ORDER — OXYCODONE-ACETAMINOPHEN 5-325 MG PO TABS: 1.0000 | ORAL_TABLET | Freq: Four times a day (QID) | ORAL | Status: DC | PRN

## 2019-02-20 MED ORDER — DIPHENHYDRAMINE HCL 25 MG PO CAPS: 25.0000 mg | ORAL_CAPSULE | Freq: Every evening | ORAL | Status: DC | PRN

## 2019-02-20 MED ORDER — CYCLOBENZAPRINE HCL 10 MG PO TABS
10.0000 mg | ORAL_TABLET | Freq: Three times a day (TID) | ORAL | Status: DC | PRN
  Administered 2019-02-20 – 2019-02-21 (×2): 10 mg via ORAL
  Filled 2019-02-20 (×2): qty 1

## 2019-02-20 MED ORDER — TRAMADOL HCL 50 MG PO TABS
50.0000 mg | ORAL_TABLET | Freq: Three times a day (TID) | ORAL | Status: DC | PRN
  Administered 2019-02-20 – 2019-02-22 (×2): 50 mg via ORAL
  Filled 2019-02-20 (×2): qty 1

## 2019-02-20 MED ORDER — SENNOSIDES-DOCUSATE SODIUM 8.6-50 MG PO TABS: 1.0000 | ORAL_TABLET | Freq: Every evening | ORAL | Status: DC | PRN

## 2019-02-20 MED ORDER — ACETAMINOPHEN 325 MG PO TABS: 650.0000 mg | ORAL_TABLET | ORAL | Status: DC | PRN

## 2019-02-20 MED ORDER — ENOXAPARIN SODIUM 40 MG/0.4ML ~~LOC~~ SOLN
40.0000 mg | SUBCUTANEOUS | Status: AC
  Administered 2019-02-20: 40 mg via SUBCUTANEOUS
  Filled 2019-02-20: qty 0.4

## 2019-02-20 MED ORDER — ACETAMINOPHEN 160 MG/5ML PO SOLN: 650.0000 mg | ORAL | Status: DC | PRN

## 2019-02-20 MED ORDER — ACETAMINOPHEN 650 MG RE SUPP: 650.0000 mg | RECTAL | Status: DC | PRN

## 2019-02-20 MED ORDER — ASPIRIN 325 MG PO TABS
325.0000 mg | ORAL_TABLET | Freq: Every day | ORAL | Status: DC
  Administered 2019-02-20: 325 mg via ORAL
  Filled 2019-02-20: qty 1

## 2019-02-20 MED ORDER — ASPIRIN 300 MG RE SUPP: 300.0000 mg | Freq: Every day | RECTAL | Status: DC

## 2019-02-20 MED ORDER — SODIUM CHLORIDE 0.9 % IV SOLN
INTRAVENOUS | Status: DC
  Administered 2019-02-20: 17:00:00 via INTRAVENOUS

## 2019-02-20 MED ORDER — ATORVASTATIN CALCIUM 40 MG PO TABS
40.0000 mg | ORAL_TABLET | Freq: Every day | ORAL | Status: DC
  Administered 2019-02-20 – 2019-02-22 (×3): 40 mg via ORAL
  Filled 2019-02-20 (×3): qty 1

## 2019-02-20 MED ORDER — STROKE: EARLY STAGES OF RECOVERY BOOK
Freq: Once | Status: AC
  Administered 2019-02-20: 17:00:00

## 2019-02-20 NOTE — ED Provider Notes (Signed)
[facility] EMERGENCY [facility]PART[facility] Provider Note   CSN: [id number] Arrival date & time: 02/20/19  1005    History   Chief Complaint Chief Complaint  Patient presents with   Cerebrovascular Accident    HPI Connie Oconnor is a 78 y.o. female.     The history is provided by the patient, medical records and the EMS personnel. No language interpreter was used.  Cerebrovascular Accident    Connie Oconnor is a 78 y.o. female who presents to the Emergency Department complaining of left sided weakness. She presents to the emergency department by EMS complaining of weakness to the left arm and leg. She was last known to be well last night between 11 and 1130 when she went to bed. When she awoke around 6 AM she noted that she was having difficulty moving her left arm and leg. She denies any fevers, cough, nausea, vomiting, headache. She does have back pain, which is at her baseline. No prior similar symptoms. Symptoms are severe and constant nature. 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/20/2019   Hypothyroidism (acquired) 02/20/2019   Closed fracture of medial portion of right tibial plateau 01/11/2018   Acute medial meniscal tear, right, initial encounter 01/11/2018   SAH (subarachnoid hemorrhage) (HCC) 03/29/2017    Past Surgical History:  Procedure Laterality Date   BACK SURGERY     CERVICAL LAMINECTOMY  1986   DILATION AND CURETTAGE OF UTERUS  1970   ELBOW SURGERY Right 1993   KNEE ARTHROSCOPY WITH  SUBCHONDROPLASTY Right 01/11/2018   Procedure: KNEE ARTHROSCOPY WITH SUBCHONDROPLASTY, PARTIAL MEDIAL MENISCECTOMY, CHONDROPLASTY OF THE MEDIAL PATELLA FEMORAL CHONDYL, MEDIAL PLICA  EXCISION;  Surgeon: Jodi GeraldsGraves, John, MD;  Location: [facility];  Service: Orthopedics;  Laterality: Right;   LUMBAR LAMINECTOMY  1993   rods placed bone generated removed one year later    MULTIPLE TOOTH EXTRACTIONS     SHOULDER SURGERY Left    arthritis clean out and shortened collar bone dr Renae Ficklepaul   TONSILLECTOMY  1970   VAGINAL HYSTERECTOMY  1971   partial dr Bunnie Dominobarker   WISDOM TOOTH EXTRACTION       OB History   No obstetric history on file.      Home Medications    Prior to Admission medications   Medication Sig Start Date End Date Taking? Authorizing Provider  acetaminophen (TYLENOL) 500 MG tablet Take 1,000 mg by mouth every 6 (six) hours as needed for mild pain or headache.    Yes [provider]  cyclobenzaprine (FLEXERIL) 10 MG tablet Take 10 mg by mouth 3 (three) times daily as needed for muscle spasms.   Yes [provider]  diphenhydrAMINE (BENADRYL) 25 MG tablet Take 25 mg by mouth at bedtime as needed for allergies.    Yes [provider]  traMADol (ULTRAM) 50 MG tablet Take 50 mg by mouth every 8 (eight) hours as needed for moderate pain.   Yes [provider]  oxyCODONE-acetaminophen (PERCOCET/ROXICET) 5-325 MG tablet Take 1-2 tablets by mouth every 6 (six) hours as needed for severe pain. Patient not taking: Reported on 02/20/2019 01/11/18   Marshia Ly, PA-C    Family History Family History  Problem Relation Age of Onset   Stroke Mother    Heart attack Father     Social History Social History   Tobacco Use   Smoking status: Current Every Day Smoker    Packs/day: 1.00    Years: 72.00    Pack years: 72.00    Types: Cigarettes   Smokeless tobacco: Never Used  Substance Use Topics   Alcohol use: No   Drug use: No     Allergies     Aspirin; Ibuprofen; and Penicillins   Review of Systems Review of Systems  All other systems reviewed and are negative.    Physical Exam Updated Vital Signs BP (!) 157/74    Pulse 71    Temp 97.9 F (36.6 C) (Oral)    Resp 14    Ht  (1.651 m)    Wt 70.3 kg    SpO2 95%    BMI 25.79 kg/m   Physical Exam Vitals signs and nursing note reviewed.  Constitutional:      Appearance: She is well-developed.  HENT:     Head: Normocephalic and atraumatic.  Cardiovascular:     Rate and Rhythm: Normal rate and regular rhythm.     Heart sounds: No murmur.  Pulmonary:     Effort: Pulmonary effort is normal. No respiratory distress.     Breath sounds: Normal breath sounds.  Abdominal:     Palpations: Abdomen is soft.     Tenderness: There is no abdominal tenderness. There is no guarding or rebound.  Musculoskeletal:        General: No tenderness.  Skin:    General: Skin is warm and dry.  Neurological:     Mental Status: She is alert and oriented to person, place, and time.     Comments: Visual fields are grossly intact. No asymmetry of facial movements. There is left upper extremity and left lower extremity drift. 4 to 5 strength in the right upper extremity, 3/5 strength in the left lower extremity. Five out of five strength in the right upper and lower extremities. Fluent speech. No neglect.  Psychiatric:        Behavior: Behavior normal.      ED Treatments / Results  Labs (all labs ordered are listed, but only abnormal results are displayed) Labs Reviewed  COMPREHENSIVE METABOLIC PANEL - Abnormal; Notable for the following components:      Result Value   Total Protein 6.3 (*)    AST 14 (*)    All other components within normal limits  URINALYSIS, ROUTINE W REFLEX MICROSCOPIC - Abnormal; Notable for the following components:   Leukocytes,Ua TRACE (*)    All other components within normal limits  ETHANOL  PROTIME-INR  APTT  CBC  DIFFERENTIAL  RAPID URINE DRUG SCREEN,  HOSP PERFORMED  TSH  T4, FREE    EKG EKG Interpretation  Date/Time:  Tuesday February 20 2019 10:12:24 EDT Ventricular Rate:  65 PR Interval:    QRS Duration: 99 QT Interval:  407 QTC Calculation: 424 R Axis:   26 Text Interpretation:  Sinus rhythm Low voltage, precordial leads Anteroseptal infarct, old Confirmed by Tilden Fossa [phone]) on 02/20/2019 10:51:11 AM   Radiology Mr Maxine Glenn Head Wo Contrast  Result Date: 02/20/2019 CLINICAL DATA:  Stroke.  Left-sided weakness. EXAM: MRI HEAD WITHOUT CONTRAST MRA HEAD WITHOUT CONTRAST MRA NECK WITHOUT CONTRAST TECHNIQUE: Multiplanar, multiecho pulse sequences of the brain and surrounding structures were obtained without intravenous contrast. Angiographic images of the Circle of Willis were obtained using MRA technique without intravenous contrast. Angiographic images of the neck were obtained using MRA technique without intravenous contrast. Carotid stenosis measurements (when applicable) are obtained utilizing NASCET criteria, using the distal internal carotid diameter as the denominator. COMPARISON:  CT head 03/29/2017 FINDINGS: MRI HEAD FINDINGS Brain: Small white matter infarct in the right corona radiata. No other acute infarct identified. Mild atrophy. Moderate to extensive chronic microvascular ischemic changes in the white matter and pons. Chronic infarcts in the basal ganglia and head of caudate on the right. Chronic microhemorrhage in the high right parietal lobe. No hematoma or fluid collection or mass lesion. No midline shift. Vascular: Normal arterial flow voids. Flow void right MCA bifurcation compatible with aneurysm. See MRA findings below. Skull and upper cervical spine: Negative Sinuses/Orbits: Mild mucosal edema paranasal sinuses.  Normal orbit Other: None MRA HEAD FINDINGS Severe stenosis distal left vertebral artery. Mild to moderate stenosis distal right vertebral artery and mild stenosis distal left vertebral artery. PICA patent  bilaterally. Atherosclerotic basilar without significant stenosis. Superior cerebellar arteries widely patent bilaterally. Atherosclerotic disease with sequential moderate stenosis ease in the left PCA. Right PCA patent. Atherosclerotic disease right cavernous carotid with mild stenosis. Left cavernous carotid widely patent. Moderate stenosis A2 segment bilaterally. Mild atherosclerotic disease left middle cerebral artery M1 and M2 segments. Mild disease in right MCA branches 7 mm aneurysm right MCA bifurcation. MRA NECK FINDINGS Antegrade flow in the carotid arteries and vertebral arteries bilaterally. Right carotid bifurcation widely patent. Mild stenosis proximal left internal and external carotid arteries. Both vertebral arteries patent without proximal stenosis. 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 Atrophy and moderate to advanced chronic microvascular ischemia Negative for large vessel occlusion. Diffuse intracranial atherosclerotic disease as above. 7 mm aneurysm right MCA bifurcation Mild stenosis left carotid bifurcation in the neck. Electronically Signed   By: Marlan Palau M.D.   On: 02/20/2019 14:37   Mr Maxine Glenn Neck Wo Contrast  Result Date: 02/20/2019 CLINICAL DATA:  Stroke.  Left-sided weakness. EXAM: MRI HEAD WITHOUT CONTRAST MRA HEAD WITHOUT CONTRAST MRA NECK WITHOUT CONTRAST TECHNIQUE: Multiplanar, multiecho pulse sequences of the brain and surrounding structures were obtained without intravenous contrast. Angiographic images of the Circle of Willis were obtained using MRA technique without intravenous contrast. Angiographic images of the neck were obtained using MRA technique without intravenous contrast. Carotid stenosis measurements (when applicable) are obtained utilizing NASCET criteria, using the distal internal carotid diameter as the denominator. COMPARISON:  CT head 03/29/2017 FINDINGS: MRI HEAD FINDINGS Brain: Small white matter  infarct in the right corona radiata. No other acute infarct identified. Mild atrophy. Moderate to extensive chronic microvascular ischemic changes in the white matter and pons. Chronic infarcts in the basal ganglia and head of caudate on the right. Chronic microhemorrhage in the high right parietal lobe. No hematoma or fluid collection or mass lesion. No midline shift. Vascular: Normal arterial flow voids. Flow void right MCA bifurcation compatible with aneurysm. See MRA findings below. Skull and upper cervical spine: Negative Sinuses/Orbits: Mild mucosal edema paranasal sinuses.  Normal orbit Other: None MRA HEAD FINDINGS Severe stenosis distal left vertebral artery. Mild to moderate stenosis distal right vertebral artery and mild stenosis distal left vertebral artery. PICA patent bilaterally. Atherosclerotic basilar  without significant stenosis. Superior cerebellar arteries widely patent bilaterally. Atherosclerotic disease with sequential moderate stenosis ease in the left PCA. Right PCA patent. Atherosclerotic disease right cavernous carotid with mild stenosis. Left cavernous carotid widely patent. Moderate stenosis A2 segment bilaterally. Mild atherosclerotic disease left middle cerebral artery M1 and M2 segments. Mild disease in right MCA branches 7 mm aneurysm right MCA bifurcation. MRA NECK FINDINGS Antegrade flow in the carotid arteries and vertebral arteries bilaterally. Right carotid bifurcation widely patent. Mild stenosis proximal left internal and external carotid arteries. Both vertebral arteries patent without proximal stenosis. 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 Atrophy and moderate to advanced chronic microvascular ischemia Negative for large vessel occlusion. Diffuse intracranial atherosclerotic disease as above. 7 mm aneurysm right MCA bifurcation Mild stenosis left carotid bifurcation in the neck. Electronically Signed   By: Marlan Palau  M.D.   On: 02/20/2019 14:37   Mr Brain Wo Contrast  Result Date: 02/20/2019 CLINICAL DATA:  Stroke.  Left-sided weakness. EXAM: MRI HEAD WITHOUT CONTRAST MRA HEAD WITHOUT CONTRAST MRA NECK WITHOUT CONTRAST TECHNIQUE: Multiplanar, multiecho pulse sequences of the brain and surrounding structures were obtained without intravenous contrast. Angiographic images of the Circle of Willis were obtained using MRA technique without intravenous contrast. Angiographic images of the neck were obtained using MRA technique without intravenous contrast. Carotid stenosis measurements (when applicable) are obtained utilizing NASCET criteria, using the distal internal carotid diameter as the denominator. COMPARISON:  CT head 03/29/2017 FINDINGS: MRI HEAD FINDINGS Brain: Small white matter infarct in the right corona radiata. No other acute infarct identified. Mild atrophy. Moderate to extensive chronic microvascular ischemic changes in the white matter and pons. Chronic infarcts in the basal ganglia and head of caudate on the right. Chronic microhemorrhage in the high right parietal lobe. No hematoma or fluid collection or mass lesion. No midline shift. Vascular: Normal arterial flow voids. Flow void right MCA bifurcation compatible with aneurysm. See MRA findings below. Skull and upper cervical spine: Negative Sinuses/Orbits: Mild mucosal edema paranasal sinuses.  Normal orbit Other: None MRA HEAD FINDINGS Severe stenosis distal left vertebral artery. Mild to moderate stenosis distal right vertebral artery and mild stenosis distal left vertebral artery. PICA patent bilaterally. Atherosclerotic basilar without significant stenosis. Superior cerebellar arteries widely patent bilaterally. Atherosclerotic disease with sequential moderate stenosis ease in the left PCA. Right PCA patent. Atherosclerotic disease right cavernous carotid with mild stenosis. Left cavernous carotid widely patent. Moderate stenosis A2 segment bilaterally.  Mild atherosclerotic disease left middle cerebral artery M1 and M2 segments. Mild disease in right MCA branches 7 mm aneurysm right MCA bifurcation. MRA NECK FINDINGS Antegrade flow in the carotid arteries and vertebral arteries bilaterally. Right carotid bifurcation widely patent. Mild stenosis proximal left internal and external carotid arteries. Both vertebral arteries patent without proximal stenosis. 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 Atrophy and moderate to advanced chronic microvascular ischemia Negative for large vessel occlusion. Diffuse intracranial atherosclerotic disease as above. 7 mm aneurysm right MCA bifurcation Mild stenosis left carotid bifurcation in the neck. Electronically Signed   By: Marlan Palau M.D.   On: 02/20/2019 14:37    Procedures Procedures (including critical care time)  Medications Ordered in ED Medications   stroke: mapping our early stages of recovery book (has no administration in time range)  aspirin suppository 300 mg (has no administration in time range)    Or  aspirin tablet 325 mg (has no administration in  time range)  traMADol (ULTRAM) tablet 50 mg (has no administration in time range)  cyclobenzaprine (FLEXERIL) tablet 10 mg (has no administration in time range)  diphenhydrAMINE (BENADRYL) capsule 25 mg (has no administration in time range)  enoxaparin (LOVENOX) injection 40 mg (has no administration in time range)  0.9 %  sodium chloride infusion (has no administration in time range)  acetaminophen (TYLENOL) tablet 650 mg (has no administration in time range)    Or  acetaminophen (TYLENOL) solution 650 mg (has no administration in time range)    Or  acetaminophen (TYLENOL) suppository 650 mg (has no administration in time range)  senna-docusate (Senokot-S) tablet 1 tablet (has no administration in time range)  atorvastatin (LIPITOR) tablet 40 mg (has no administration in time range)      Initial Impression / Assessment and Plan / ED Course  I have reviewed the triage vital signs and the nursing notes.  Pertinent labs & imaging results that were available during my care of the patient were reviewed by me and considered in my medical decision making (see chart for details).       Patient presents to the emergency department for evaluation of left sided weakness. She does have hemiparesis on examination. She is outside of the TPA window due to the duration of symptoms. Imaging is consistent with acute CVA. Neurology consulted and evaluated the patient in the emergency department. Medicine consulted for admission for further management. Final Clinical Impressions(s) / ED Diagnoses   Final diagnoses:  None    ED Discharge Orders    None       Tilden Fossa, MD 02/20/19 [phone]

## 2019-02-20 NOTE — Progress Notes (Signed)
Patient arrived to [phone]. A&O x4. Denies pain. POC of care given t patient. Nurse will continue to monitor Dyke Brackett, RN.

## 2019-02-20 NOTE — ED Notes (Signed)
Hook patient back up to the monitor after x-ray placed the patient on the bedpan patient is now off the bedpan resting with call bell in reach

## 2019-02-20 NOTE — Plan of Care (Signed)
  Problem: Education: Goal: Knowledge of General Education information will improve Description: Including pain rating scale, medication(s)/side effects and non-pharmacologic comfort measures Outcome: Progressing   Problem: Health Behavior/Discharge Planning: Goal: Ability to manage health-related needs will improve Outcome: Progressing   Problem: Clinical Measurements: Goal: Ability to maintain clinical measurements within normal limits will improve Outcome: Progressing Goal: Will remain free from infection Outcome: Progressing Goal: Diagnostic test results will improve Outcome: Progressing Goal: Respiratory complications will improve Outcome: Progressing Goal: Cardiovascular complication will be avoided Outcome: Progressing   Problem: Activity: Goal: Risk for activity intolerance will decrease Outcome: Progressing   Problem: Nutrition: Goal: Adequate nutrition will be maintained Outcome: Progressing   Problem: Coping: Goal: Level of anxiety will decrease Outcome: Progressing   Problem: Elimination: Goal: Will not experience complications related to bowel motility Outcome: Progressing Goal: Will not experience complications related to urinary retention Outcome: Progressing   Problem: Pain Managment: Goal: General experience of comfort will improve Outcome: Progressing   Problem: Safety: Goal: Ability to remain free from injury will improve Outcome: Progressing   Problem: Skin Integrity: Goal: Risk for impaired skin integrity will decrease Outcome: Progressing   Problem: Education: Goal: Knowledge of disease or condition will improve Outcome: Progressing Goal: Knowledge of secondary prevention will improve Outcome: Progressing Goal: Knowledge of patient specific risk factors addressed and post discharge goals established will improve Outcome: Progressing Goal: Individualized Educational Video(s) Outcome: Progressing   Problem: Coping: Goal: Will verbalize  positive feelings about self Outcome: Progressing Goal: Will identify appropriate support needs Outcome: Progressing   Problem: Health Behavior/Discharge Planning: Goal: Ability to manage health-related needs will improve Outcome: Progressing   Problem: Self-Care: Goal: Ability to participate in self-care as condition permits will improve Outcome: Progressing Goal: Verbalization of feelings and concerns over difficulty with self-care will improve Outcome: Progressing Goal: Ability to communicate needs accurately will improve Outcome: Progressing   Problem: Nutrition: Goal: Risk of aspiration will decrease Outcome: Progressing   Problem: Ischemic Stroke/TIA Tissue Perfusion: Goal: Complications of ischemic stroke/TIA will be minimized Outcome: Progressing   [name], BSN, RN 

## 2019-02-20 NOTE — H&P (Signed)
History and Physical    Connie Oconnor ZOX:[id number] DOB: [birth year] DOA: 02/20/2019  PCP: Lovenia Kim, PA-C Consultants:  Luiz Blare - orthopedics Patient coming from:  Home - lives alone; NOK: Daughter, Connie Oconnor, [phone]  Chief Complaint: left-sided weakness  HPI: Connie Oconnor is a 78 y.o. female with medical history significant of R SAH from a fall (03/2017) and hypothyroidism presenting with left-sided weakness.  She reports that she couldn't get out of the bed this morning and when she did, she slid into the floor.  She couldn't get to the bathroom and wet the floor.  She finally managed to get ahold of her daughter and they called 911.  Her left arm and left leg were weak but she really couldn't use them for standing and she couldn't even push herself up to a sitting position in bed.  She was normal last night when she went to bed.  She did not wake up to void overnight.  No dysphagia but she had some mild dysarthria which she attributes to not having her teeth in her mouth.  She was told she was a little droopy on her face.  No prior h/o strokes.   ED Course:  Left hemiparesis upon wakening, MRI shows a stroke (lacunar infarct in the right corona radiata).  Neurology has seen her.  Review of Systems: As per HPI; otherwise review of systems reviewed and negative.   Ambulatory Status:  Ambulates with a cane  Past Medical History:  Diagnosis Date   Anemia    hx of   Arthritis    Dislocation of right knee with medial meniscus tear    Headache    hx of migraines none since 1990's   Hypothyroidism    1978, no problems since   Pneumonia 1990's none since   PVC (premature ventricular contraction)    199'0's none since   SAH (subarachnoid hemorrhage) (HCC) 2016   On the right    Past Surgical History:  Procedure Laterality Date   BACK SURGERY     CERVICAL LAMINECTOMY  1986   DILATION AND CURETTAGE OF UTERUS  1970   ELBOW SURGERY Right 1993   KNEE  ARTHROSCOPY WITH SUBCHONDROPLASTY Right 01/11/2018   Procedure: KNEE ARTHROSCOPY WITH SUBCHONDROPLASTY, PARTIAL MEDIAL MENISCECTOMY, CHONDROPLASTY OF THE MEDIAL PATELLA FEMORAL CHONDYL, MEDIAL PLICA  EXCISION;  Surgeon: Jodi Geralds, MD;  Location: [facility];  Service: Orthopedics;  Laterality: Right;   LUMBAR LAMINECTOMY  1993   rods placed bone generated removed one year later    MULTIPLE TOOTH EXTRACTIONS     SHOULDER SURGERY Left    arthritis clean out and shortened collar bone dr Renae Fickle   TONSILLECTOMY  1970   VAGINAL HYSTERECTOMY  1971   partial dr Bunnie Domino TOOTH EXTRACTION      Social History   Socioeconomic History   Marital status: Widowed    Spouse name: Not on file   Number of children: Not on file   Years of education: Not on file   Highest education level: Not on file  Occupational History   Occupation: retired  Ecologist strain: Not on file   Food insecurity:    Worry: Not on file    Inability: Not on file   Transportation needs:    Medical: Not on file    Non-medical: Not on file  Tobacco Use   Smoking status: Current Every Day Smoker    Packs/day: 1.00    Years: 72.00  Pack years: 72.00    Types: Cigarettes   Smokeless tobacco: Never Used  Substance and Sexual Activity   Alcohol use: No   Drug use: No   Sexual activity: Not on file  Lifestyle   Physical activity:    Days per week: Not on file    Minutes per session: Not on file   Stress: Not on file  Relationships   Social connections:    Talks on phone: Not on file    Gets together: Not on file    Attends religious service: Not on file    Active member of club or organization: Not on file    Attends meetings of clubs or organizations: Not on file    Relationship status: Not on file   Intimate partner violence:    Fear of current or ex partner: Not on file    Emotionally abused: Not on file    Physically abused: Not on file    Forced sexual  activity: Not on file  Other Topics Concern   Not on file  Social History Narrative   Not on file    Allergies  Allergen Reactions   Aspirin Other (See Comments)    shaking   Ibuprofen Other (See Comments)    Shaking   Penicillins Rash and Other (See Comments)    Has patient had a PCN reaction causing immediate rash, facial/tongue/throat swelling, SOB or lightheadedness with hypotension: No Has patient had a PCN reaction causing severe rash involving mucus membranes or skin necrosis: No Has patient had a PCN reaction that required hospitalization: Yes - was in hospital Has patient had a PCN reaction occurring within the last 10 years: No If all of the above answers are "NO", then may proceed with Cephalosporin use.     Family History  Problem Relation Age of Onset   Stroke Mother    Heart attack Father     Prior to Admission medications   Medication Sig Start Date End Date Taking? Authorizing Provider  acetaminophen (TYLENOL) 500 MG tablet Take 1,000 mg by mouth every 6 (six) hours as needed for mild pain or headache.     [provider]  cephALEXin (KEFLEX) 500 MG capsule Take 1 capsule (500 mg total) by mouth 4 (four) times daily. 03/15/18   Donnetta Hutching, MD  cyclobenzaprine (FLEXERIL) 10 MG tablet Take 10 mg by mouth 3 (three) times daily as needed for muscle spasms.    [provider]  diphenhydrAMINE (BENADRYL) 25 MG tablet Take 25 mg by mouth at bedtime as needed for allergies.     [provider]  glucosamine-chondroitin 500-400 MG tablet Take 1 tablet by mouth at bedtime.     [provider]  oxyCODONE-acetaminophen (PERCOCET/ROXICET) 5-325 MG tablet Take 1-2 tablets by mouth every 6 (six) hours as needed for severe pain. 01/11/18   Marshia Ly, PA-C  traMADol (ULTRAM) 50 MG tablet Take 50 mg by mouth every 8 (eight) hours as needed for moderate pain.    [provider]    Physical Exam: Vitals:   02/20/19 1130  02/20/19 1145 02/20/19 1200 02/20/19 1215  BP: 130/69 (!) 148/72 (!) 154/125 (!) 154/119  Pulse: 65 66 67 70  Resp: (!) 24  Temp:      TempSrc:      SpO2: 95% 97% 96% 100%  Weight:      Height:          General:  Appears calm and comfortable and is  NAD  Eyes:  PERRL, EOMI, normal lids, iris; intermittently closes her right eye (does not realize she is doing this) in order to see things, begging the question of diplopia  ENT:  grossly normal hearing, lips & tongue, mmm; artificial dentition  Neck:  no LAD, masses or thyromegaly; no carotid bruits  Cardiovascular:  RRR, no m/r/g. No LE edema.   Respiratory:   CTA bilaterally with no wheezes/rales/rhonchi.  Normal respiratory effort.  Abdomen:  soft, NT, ND, NABS  Skin:  no rash or induration seen on limited exam  Musculoskeletal: LUE 3-4/5 strength, LLE 4-5/5 strength compared to the right  Lower extremity:  No LE edema.  Limited foot exam with no ulcerations.  2+ distal pulses.  Psychiatric:  grossly normal mood and affect, speech fluent and appropriate but mild intermittent dysarthria, AOx3  Neurologic:  CN 2-12 grossly intact other than left facial droop along the lower face, moves all extremities in coordinated fashion other than as described above, sensation intact    Radiological Exams on Admission: Mr Shirlee Latch WU Contrast  Result Date: 02/20/2019 CLINICAL DATA:  Stroke.  Left-sided weakness. EXAM: MRI HEAD WITHOUT CONTRAST MRA HEAD WITHOUT CONTRAST MRA NECK WITHOUT CONTRAST TECHNIQUE: Multiplanar, multiecho pulse sequences of the brain and surrounding structures were obtained without intravenous contrast. Angiographic images of the Circle of Willis were obtained using MRA technique without intravenous contrast. Angiographic images of the neck were obtained using MRA technique without intravenous contrast. Carotid stenosis measurements (when applicable) are obtained utilizing NASCET criteria, using the distal  internal carotid diameter as the denominator. COMPARISON:  CT head 03/29/2017 FINDINGS: MRI HEAD FINDINGS Brain: Small white matter infarct in the right corona radiata. No other acute infarct identified. Mild atrophy. Moderate to extensive chronic microvascular ischemic changes in the white matter and pons. Chronic infarcts in the basal ganglia and head of caudate on the right. Chronic microhemorrhage in the high right parietal lobe. No hematoma or fluid collection or mass lesion. No midline shift. Vascular: Normal arterial flow voids. Flow void right MCA bifurcation compatible with aneurysm. See MRA findings below. Skull and upper cervical spine: Negative Sinuses/Orbits: Mild mucosal edema paranasal sinuses.  Normal orbit Other: None MRA HEAD FINDINGS Severe stenosis distal left vertebral artery. Mild to moderate stenosis distal right vertebral artery and mild stenosis distal left vertebral artery. PICA patent bilaterally. Atherosclerotic basilar without significant stenosis. Superior cerebellar arteries widely patent bilaterally. Atherosclerotic disease with sequential moderate stenosis ease in the left PCA. Right PCA patent. Atherosclerotic disease right cavernous carotid with mild stenosis. Left cavernous carotid widely patent. Moderate stenosis A2 segment bilaterally. Mild atherosclerotic disease left middle cerebral artery M1 and M2 segments. Mild disease in right MCA branches 7 mm aneurysm right MCA bifurcation. MRA NECK FINDINGS Antegrade flow in the carotid arteries and vertebral arteries bilaterally. Right carotid bifurcation widely patent. Mild stenosis proximal left internal and external carotid arteries. Both vertebral arteries patent without proximal stenosis. 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 Atrophy and moderate to advanced chronic microvascular ischemia Negative for large vessel occlusion. Diffuse intracranial atherosclerotic disease as  above. 7 mm aneurysm right MCA bifurcation Mild stenosis left carotid bifurcation in the neck. Electronically Signed   By: Marlan Palau M.D.   On: 02/20/2019 14:37   Mr Maxine Glenn Neck Wo Contrast  Result Date: 02/20/2019 CLINICAL DATA:  Stroke.  Left-sided weakness. EXAM: MRI HEAD WITHOUT CONTRAST MRA HEAD WITHOUT CONTRAST MRA NECK WITHOUT CONTRAST TECHNIQUE: Multiplanar, multiecho pulse sequences  of the brain and surrounding structures were obtained without intravenous contrast. Angiographic images of the Circle of Willis were obtained using MRA technique without intravenous contrast. Angiographic images of the neck were obtained using MRA technique without intravenous contrast. Carotid stenosis measurements (when applicable) are obtained utilizing NASCET criteria, using the distal internal carotid diameter as the denominator. COMPARISON:  CT head 03/29/2017 FINDINGS: MRI HEAD FINDINGS Brain: Small white matter infarct in the right corona radiata. No other acute infarct identified. Mild atrophy. Moderate to extensive chronic microvascular ischemic changes in the white matter and pons. Chronic infarcts in the basal ganglia and head of caudate on the right. Chronic microhemorrhage in the high right parietal lobe. No hematoma or fluid collection or mass lesion. No midline shift. Vascular: Normal arterial flow voids. Flow void right MCA bifurcation compatible with aneurysm. See MRA findings below. Skull and upper cervical spine: Negative Sinuses/Orbits: Mild mucosal edema paranasal sinuses.  Normal orbit Other: None MRA HEAD FINDINGS Severe stenosis distal left vertebral artery. Mild to moderate stenosis distal right vertebral artery and mild stenosis distal left vertebral artery. PICA patent bilaterally. Atherosclerotic basilar without significant stenosis. Superior cerebellar arteries widely patent bilaterally. Atherosclerotic disease with sequential moderate stenosis ease in the left PCA. Right PCA patent.  Atherosclerotic disease right cavernous carotid with mild stenosis. Left cavernous carotid widely patent. Moderate stenosis A2 segment bilaterally. Mild atherosclerotic disease left middle cerebral artery M1 and M2 segments. Mild disease in right MCA branches 7 mm aneurysm right MCA bifurcation. MRA NECK FINDINGS Antegrade flow in the carotid arteries and vertebral arteries bilaterally. Right carotid bifurcation widely patent. Mild stenosis proximal left internal and external carotid arteries. Both vertebral arteries patent without proximal stenosis. 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 Atrophy and moderate to advanced chronic microvascular ischemia Negative for large vessel occlusion. Diffuse intracranial atherosclerotic disease as above. 7 mm aneurysm right MCA bifurcation Mild stenosis left carotid bifurcation in the neck. Electronically Signed   By: Marlan Palauharles  Clark M.D.   On: 02/20/2019 14:37   Mr Brain Wo Contrast  Result Date: 02/20/2019 CLINICAL DATA:  Stroke.  Left-sided weakness. EXAM: MRI HEAD WITHOUT CONTRAST MRA HEAD WITHOUT CONTRAST MRA NECK WITHOUT CONTRAST TECHNIQUE: Multiplanar, multiecho pulse sequences of the brain and surrounding structures were obtained without intravenous contrast. Angiographic images of the Circle of Willis were obtained using MRA technique without intravenous contrast. Angiographic images of the neck were obtained using MRA technique without intravenous contrast. Carotid stenosis measurements (when applicable) are obtained utilizing NASCET criteria, using the distal internal carotid diameter as the denominator. COMPARISON:  CT head 03/29/2017 FINDINGS: MRI HEAD FINDINGS Brain: Small white matter infarct in the right corona radiata. No other acute infarct identified. Mild atrophy. Moderate to extensive chronic microvascular ischemic changes in the white matter and pons. Chronic infarcts in the basal ganglia and head of  caudate on the right. Chronic microhemorrhage in the high right parietal lobe. No hematoma or fluid collection or mass lesion. No midline shift. Vascular: Normal arterial flow voids. Flow void right MCA bifurcation compatible with aneurysm. See MRA findings below. Skull and upper cervical spine: Negative Sinuses/Orbits: Mild mucosal edema paranasal sinuses.  Normal orbit Other: None MRA HEAD FINDINGS Severe stenosis distal left vertebral artery. Mild to moderate stenosis distal right vertebral artery and mild stenosis distal left vertebral artery. PICA patent bilaterally. Atherosclerotic basilar without significant stenosis. Superior cerebellar arteries widely patent bilaterally. Atherosclerotic disease with sequential moderate stenosis ease in the left PCA. Right  PCA patent. Atherosclerotic disease right cavernous carotid with mild stenosis. Left cavernous carotid widely patent. Moderate stenosis A2 segment bilaterally. Mild atherosclerotic disease left middle cerebral artery M1 and M2 segments. Mild disease in right MCA branches 7 mm aneurysm right MCA bifurcation. MRA NECK FINDINGS Antegrade flow in the carotid arteries and vertebral arteries bilaterally. Right carotid bifurcation widely patent. Mild stenosis proximal left internal and external carotid arteries. Both vertebral arteries patent without proximal stenosis. 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 Atrophy and moderate to advanced chronic microvascular ischemia Negative for large vessel occlusion. Diffuse intracranial atherosclerotic disease as above. 7 mm aneurysm right MCA bifurcation Mild stenosis left carotid bifurcation in the neck. Electronically Signed   By: Marlan Palau M.D.   On: 02/20/2019 14:37    EKG: Independently reviewed.  NSR with rate 65; nonspecific ST changes with no evidence of acute ischemia   Labs on Admission: I have personally reviewed the available labs and imaging  studies at the time of the admission.  Pertinent labs:   Unremarkable CMP Normal CBC INR 1.0 ETOH negative  Assessment/Plan Principal Problem:   Right-sided lacunar infarction [facility]) Active Problems:   SAH (subarachnoid hemorrhage) (HCC)   Hypothyroidism (acquired)   CVA - R lacunar infarct -Patient with acute onset of left-sided hemiparesis this AM -Uncertain time of onset and h/o SAH and so not a candidate for TPA -MRI is positive for CVA -Will admit for further CVA evaluation -Telemetry monitoring -Carotid dopplers -Echo -Risk stratification with FLP, A1c; will also check TSH -ASA daily - she reports an allergy but upon further questioning it made her "jittery"; after DAPT, she likely can go on ASA 81 mg daily and change to Plavix only if ongoing side effects -Neurology consult -PT/OT/ST/Nutrition Consults -CIR consult for placement; she lives alone and appears very likely to benefit from CIR  HTN -Allow permissive HTN for now -Treat BP only if >220/120, and then with goal of 15% reduction -She does not take BP medications at this time   HLD -Check FLP -Empirically start Lipitor 40 mg daily  H/o SAH -Remote and associated with a fall -It should be ok to use DVT prevention-dose Lovenox in this patient  Hypothyroidism  -Check TSH and free T4 -Continue Synthroid at current dose for now  DVT prophylaxis:  Lovenox  Code Status: DNR - confirmed with patient Family Communication: None present Disposition Plan:  Likely to benefit from CIR Consults called: Neurology; CIR; PT/OT/ST/Nutrition  Admission status: Admit - It is my clinical opinion that admission to INPATIENT is reasonable and necessary because of the expectation that this patient will require hospital care that crosses at least 2 midnights to treat this condition based on the medical complexity of the problems presented.  Given the aforementioned information, the predictability of an adverse outcome is felt to  be significant.    Jonah Blue MD [facility]   How to contact the [facility] Attending or Consulting provider 7A - 7P or covering provider during after hours 7P -7A, for this patient?  1. Check the care team in [facility] and look for a) attending/consulting [facility] provider listed and b) the [facility] team listed 2. Log into [email or web address] and use [facility] universal password to access. If you do not have the password, please contact the hospital operator. 3. Locate the [facility] provider you are looking for under [facility] and page to a number that you can be directly reached. 4. If you still have  difficulty reaching the provider, please page the [facility] (Director on Call) for the Hospitalists listed on amion for assistance.   02/20/2019, 3:08 PM

## 2019-02-20 NOTE — Consult Note (Addendum)
Neurology Consultation  Reason for Consult: Left-sided weakness Referring Physician: Pecola Leisure  CC: Left-sided weakness  History is obtained from: Patient  HPI: Connie Oconnor is a 78 y.o. female with past medical history of PVC, hypothyroidism, arthritis, migraines.  Patient states that she went to bed at approximately 11:30 PM last night to which she was normal.  When patient awoke this morning she slid out of her bed secondary to left-sided weakness.  She continues to have left-sided weakness at this time.  Patient was brought to the hospital for further evaluation of possible stroke.  Patient states that she is allergic to aspirin and thus does not take it.  Her allergy is she feels shakiness.  She has never been on Plavix.  LKW: 2330 on 02/23/2019 tpa given?: no, out of window Premorbid modified Rankin scale (mRS): 0 NIH stroke scale of 1  ROS: A 14 point ROS was performed and is negative except as noted in the HPI.  Past Medical History:  Diagnosis Date  . Anemia    hx of  . Arthritis   . Dislocation of right knee with medial meniscus tear   . Headache    hx of migraines none since 1990's  . Hypothyroidism    1978, no problems since  . Pneumonia 1990's none since  . PVC (premature ventricular contraction)    199'0's none since  . SAH (subarachnoid hemorrhage) (HCC)    On the right     Family History  Problem Relation Age of Onset  . Stroke Mother   . Heart attack Father    Social History:   reports that she has been smoking cigarettes. She has a 30.00 pack-year smoking history. She has never used smokeless tobacco. She reports that she does not drink alcohol or use drugs.  Medications No current facility-administered medications for this encounter.   Current Outpatient Medications:  .  acetaminophen (TYLENOL) 500 MG tablet, Take 1,000 mg by mouth every 6 (six) hours as needed for mild pain or headache. , Disp: , Rfl:  .  cephALEXin (KEFLEX) 500 MG capsule, Take 1  capsule (500 mg total) by mouth 4 (four) times daily., Disp: 20 capsule, Rfl: 0 .  cyclobenzaprine (FLEXERIL) 10 MG tablet, Take 10 mg by mouth 3 (three) times daily as needed for muscle spasms., Disp: , Rfl:  .  diphenhydrAMINE (BENADRYL) 25 MG tablet, Take 25 mg by mouth at bedtime as needed for allergies. , Disp: , Rfl:  .  glucosamine-chondroitin 500-400 MG tablet, Take 1 tablet by mouth at bedtime. , Disp: , Rfl:  .  oxyCODONE-acetaminophen (PERCOCET/ROXICET) 5-325 MG tablet, Take 1-2 tablets by mouth every 6 (six) hours as needed for severe pain., Disp: 40 tablet, Rfl: 0 .  traMADol (ULTRAM) 50 MG tablet, Take 50 mg by mouth every 8 (eight) hours as needed for moderate pain., Disp: , Rfl:    Exam: Current vital signs: BP (!) 154/119   Pulse 70   Temp 97.9 F (36.6 C) (Oral)   Resp (!) 24   Ht 5\' 5"  (1.651 m)   Wt 70.3 kg   SpO2 100%   BMI 25.79 kg/m  Vital signs in last 24 hours: Temp:  [97.9 F (36.6 C)] 97.9 F (36.6 C) (04/21 1019) Pulse Rate:  [65-70] 70 (04/21 1215) Resp:  [13-24] 24 (04/21 1215) BP: (130-162)/(69-125) 154/119 (04/21 1215) SpO2:  [95 %-100 %] 100 % (04/21 1215) Weight:  [70.3 kg] 70.3 kg (04/21 1007)  Physical Exam  Constitutional: Appears well-developed  and well-nourished.  Psych: Affect appropriate to situation Eyes: No scleral injection HENT: No OP obstrucion Head: Normocephalic.  Cardiovascular: Normal rate and regular rhythm.  Respiratory: Effort normal, non-labored breathing GI: Soft.  No distension. There is no tenderness.  Skin: WDI  Neuro: Mental Status: Patient is awake, alert, oriented to person, place, month, year, and situation. Patient is able to give a clear and coherent history. No signs of aphasia or neglect Cranial Nerves: II: Visual Fields are full.  III,IV, VI: EOMI without ptosis or diploplia. Pupils equal, round and reactive to light V: Facial sensation is symmetric to temperature VII: Left facial droop VIII: hearing  is intact to voice X: Palat elevates symmetrically XI: Shoulder shrug is symmetric. XII: tongue is midline without atrophy or fasciculations.  Motor: 4/5 strength on the right with 4/5 strength on the left Sensory: Sensation is symmetric to light touch and temperature in the arms and legs. Deep Tendon Reflexes: 2+ and symmetric in the biceps and 3+ patellae likely secondary to neck surgery and 0 at the Achilles Plantars: Toes are downgoing bilaterally Cerebellar: FNF on the right however she had difficulty with a left secondary to weakness and HKS are intact bilaterally  Labs I have reviewed labs in epic and the results pertinent to this consultation are:   CBC    Component Value Date/Time   WBC 6.9 02/20/2019 1242   RBC 5.05 02/20/2019 1242   HGB 14.1 02/20/2019 1242   HCT 44.6 02/20/2019 1242   PLT 291 02/20/2019 1242   MCV 88.3 02/20/2019 1242   MCH 27.9 02/20/2019 1242   MCHC 31.6 02/20/2019 1242   RDW 13.1 02/20/2019 1242   LYMPHSABS 0.8 02/20/2019 1242   MONOABS 0.5 02/20/2019 1242   EOSABS 0.1 02/20/2019 1242   BASOSABS 0.0 02/20/2019 1242    CMP     Component Value Date/Time   NA 137 03/15/2018 1833   K 4.1 03/15/2018 1833   CL 99 (L) 03/15/2018 1833   CO2 25 03/15/2018 1833   GLUCOSE 113 (H) 03/15/2018 1833   BUN 10 03/15/2018 1833   CREATININE 0.92 03/15/2018 1833   CALCIUM 9.5 03/15/2018 1833   PROT 7.7 03/15/2018 1833   ALBUMIN 4.1 03/15/2018 1833   AST 18 03/15/2018 1833   ALT 17 03/15/2018 1833   ALKPHOS 69 03/15/2018 1833   BILITOT 1.0 03/15/2018 1833   GFRNONAA 59 (L) 03/15/2018 1833   GFRAA >60 03/15/2018 1833    Lipid Panel  No results found for: CHOL, TRIG, HDL, CHOLHDL, VLDL, LDLCALC, LDLDIRECT   Imaging I have reviewed the images obtained: MRI examination of the brain- official read pending  Felicie Mornavid Smith PA-C [facility] [phone]  M-F  (9:00 am- 5:00 PM)  02/20/2019, 12:57 PM   Attending Neurohospitalist  Addendum Patient seen and examined with APP/Resident. Agree with the history and physical as documented above. I have independently reviewed the chart, obtained history, review of systems and examined the patient.I have personally reviewed pertinent head/neck/spine imaging (CT/MRI). 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  Official reading pending.   Assessment:  78 year old woman presenting with left-sided weakness with last known normal last night outside the window for IV TPA. No exam findings of cortical nature to suggest LVO. MRI of the brain with a lacunar infarct in the right corona radiata. Official read of the MRI pending Admit for stroke work-up  Impression: - Stroke likely small vessel  Recommendations: -Admit to hospitalist or observation -Telemetry monitoring -Allow for permissive hypertension for  the first 24-48h - only treat PRN if SBP >220 mmHg. Blood pressures can be gradually normalized to SBP<140 upon discharge. -Echocardiogram -HgbA1c, fasting lipid panel -Frequent neuro checks -Prophylactic therapy-Antiplatelet med: Aspirin - dose  PO or  PR -Atorvastatin 80 mg PO daily -Risk factor modification -PT consult, OT consult, Speech consult -If Afib found on telemetry, will need anticoagulation. Decision pending imaging and stroke team rounding.  Please page stroke NP/PA/MD (listed on AMION)  from 8am-4 pm as this patient will be followed by the stroke team at this point.  Please feel free to call with any questions. --- Milon Dikes, MD [facility] Pager: [phone]  If 7pm to 7am, please call on call as listed on AMION.   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 Will defer management and referral for that to neuro IR if deemed appropriate to stroke neurology. Stroke neurology will follow with you

## 2019-02-20 NOTE — ED Triage Notes (Signed)
Pt here from home with c/o left side weakness that , pt was fine last night , lsn  1130pm 4/20/  Pt was up at approx 0630

## 2019-02-20 NOTE — Progress Notes (Signed)
  Echocardiogram 2D Echocardiogram has been performed.  Connie Oconnor L Androw 02/20/2019, 5:03 PM

## 2019-02-20 NOTE — ED Provider Notes (Signed)
Care assumed from Dr. Madilyn Hook, please see their notes for full documentation of patient's complaint/HPI. Briefly, pt here with left sided weakness and facial droop that she noticed upon waking up at 6:30 AM today; last known normal at 11:30 PM last night. No results at this time; blood work pending. Neuro hospitalist evaluated patient prior to labs or CTA head/neck returned; have changed orders to stat MRI instead. Awaiting all labs and imaging at this time. Plan is to admit patient once labs/imaging return. Dr. Jerrell Belfast with neuro will evaluate images prior to deciding dispo.  Physical Exam   BP (!) 154/119   Pulse 70   Temp 97.9 F (36.6 C) (Oral)   Resp (!) 24   Ht 5\' 5"  (1.651 m)   Wt 70.3 kg   SpO2 100%   BMI 25.79 kg/m   Physical Exam Vitals signs and nursing note reviewed.  Constitutional:      Appearance: She is not ill-appearing.  HENT:     Head: Normocephalic and atraumatic.  Eyes:     Conjunctiva/sclera: Conjunctivae normal.  Neck:     Musculoskeletal: Neck supple.  Cardiovascular:     Rate and Rhythm: Normal rate and regular rhythm.     Pulses: Normal pulses.  Pulmonary:     Effort: Pulmonary effort is normal.     Breath sounds: Normal breath sounds. No wheezing, rhonchi or rales.  Abdominal:     Palpations: Abdomen is soft.     Tenderness: There is no abdominal tenderness.  Skin:    General: Skin is warm and dry.  Neurological:     Mental Status: She is alert and oriented to person, place, and time.     Comments: CN II-XII intact. Strength 4/5 in left upper and left lower extremity compared to right. Finger to nose normal on right; unable to compare on left given weakness. Heal to shin normal.       ED Course/Procedures     Procedures  Results for orders placed or performed during the hospital encounter of 02/20/19  Ethanol  Result Value Ref Range   Alcohol, Ethyl (B) <10 <10 mg/dL  Protime-INR  Result Value Ref Range   Prothrombin Time 13.2 11.4 - 15.2  seconds   INR 1.0 0.8 - 1.2  APTT  Result Value Ref Range   aPTT 28 24 - 36 seconds  CBC  Result Value Ref Range   WBC 6.9 4.0 - 10.5 K/uL   RBC 5.05 3.87 - 5.11 MIL/uL   Hemoglobin 14.1 12.0 - 15.0 g/dL   HCT 51.8 84.1 - 66.0 %   MCV 88.3 80.0 - 100.0 fL   MCH 27.9 26.0 - 34.0 pg   MCHC 31.6 30.0 - 36.0 g/dL   RDW 63.0 16.0 - 10.9 %   Platelets 291 150 - 400 K/uL   nRBC 0.0 0.0 - 0.2 %  Differential  Result Value Ref Range   Neutrophils Relative % 80 %   Neutro Abs 5.5 1.7 - 7.7 K/uL   Lymphocytes Relative 11 %   Lymphs Abs 0.8 0.7 - 4.0 K/uL   Monocytes Relative 7 %   Monocytes Absolute 0.5 0.1 - 1.0 K/uL   Eosinophils Relative 1 %   Eosinophils Absolute 0.1 0.0 - 0.5 K/uL   Basophils Relative 1 %   Basophils Absolute 0.0 0.0 - 0.1 K/uL   Immature Granulocytes 0 %   Abs Immature Granulocytes 0.01 0.00 - 0.07 K/uL  Comprehensive metabolic panel  Result Value Ref Range  Sodium 141 135 - 145 mmol/L   Potassium 3.8 3.5 - 5.1 mmol/L   Chloride 104 98 - 111 mmol/L   CO2 25 22 - 32 mmol/L   Glucose, Bld 99 70 - 99 mg/dL   BUN 8 8 - 23 mg/dL   Creatinine, Ser 1.61 0.44 - 1.00 mg/dL   Calcium 9.4 8.9 - 09.6 mg/dL   Total Protein 6.3 (L) 6.5 - 8.1 g/dL   Albumin 3.8 3.5 - 5.0 g/dL   AST 14 (L) 15 - 41 U/L   ALT 15 0 - 44 U/L   Alkaline Phosphatase 54 38 - 126 U/L   Total Bilirubin 0.5 0.3 - 1.2 mg/dL   GFR calc non Af Amer >60 >60 mL/min   GFR calc Af Amer >60 >60 mL/min   Anion gap 12 5 - 15  Urine rapid drug screen (hosp performed)  Result Value Ref Range   Opiates NONE DETECTED NONE DETECTED   Cocaine NONE DETECTED NONE DETECTED   Benzodiazepines NONE DETECTED NONE DETECTED   Amphetamines NONE DETECTED NONE DETECTED   Tetrahydrocannabinol NONE DETECTED NONE DETECTED   Barbiturates NONE DETECTED NONE DETECTED  Urinalysis, Routine w reflex microscopic  Result Value Ref Range   Color, Urine YELLOW YELLOW   APPearance CLEAR CLEAR   Specific Gravity, Urine 1.008  1.005 - 1.030   pH 7.0 5.0 - 8.0   Glucose, UA NEGATIVE NEGATIVE mg/dL   Hgb urine dipstick NEGATIVE NEGATIVE   Bilirubin Urine NEGATIVE NEGATIVE   Ketones, ur NEGATIVE NEGATIVE mg/dL   Protein, ur NEGATIVE NEGATIVE mg/dL   Nitrite NEGATIVE NEGATIVE   Leukocytes,Ua TRACE (A) NEGATIVE   RBC / HPF 0-5 0 - 5 RBC/hpf   WBC, UA 0-5 0 - 5 WBC/hpf   Bacteria, UA NONE SEEN NONE SEEN   Squamous Epithelial / LPF 0-5 0 - 5  TSH  Result Value Ref Range   TSH 3.020 0.350 - 4.500 uIU/mL  ECHOCARDIOGRAM COMPLETE  Result Value Ref Range   Weight 2,480 oz   Height 65 in   BP 174/68 mmHg   Mr [facility] Wo Contrast  Result Date: 02/20/2019 CLINICAL DATA:  Stroke.  Left-sided weakness. EXAM: MRI HEAD WITHOUT CONTRAST MRA HEAD WITHOUT CONTRAST MRA NECK WITHOUT CONTRAST TECHNIQUE: Multiplanar, multiecho pulse sequences of the brain and surrounding structures were obtained without intravenous contrast. Angiographic images of the Circle of Willis were obtained using MRA technique without intravenous contrast. Angiographic images of the neck were obtained using MRA technique without intravenous contrast. Carotid stenosis measurements (when applicable) are obtained utilizing NASCET criteria, using the distal internal carotid diameter as the denominator. COMPARISON:  CT head 03/29/2017 FINDINGS: MRI HEAD FINDINGS Brain: Small white matter infarct in the right corona radiata. No other acute infarct identified. Mild atrophy. Moderate to extensive chronic microvascular ischemic changes in the white matter and pons. Chronic infarcts in the basal ganglia and head of caudate on the right. Chronic microhemorrhage in the high right parietal lobe. No hematoma or fluid collection or mass lesion. No midline shift. Vascular: Normal arterial flow voids. Flow void right MCA bifurcation compatible with aneurysm. See MRA findings below. Skull and upper cervical spine: Negative Sinuses/Orbits: Mild mucosal edema paranasal sinuses.   Normal orbit Other: None MRA HEAD FINDINGS Severe stenosis distal left vertebral artery. Mild to moderate stenosis distal right vertebral artery and mild stenosis distal left vertebral artery. PICA patent bilaterally. Atherosclerotic basilar without significant stenosis. Superior cerebellar arteries widely patent bilaterally. Atherosclerotic disease with sequential moderate  stenosis ease in the left PCA. Right PCA patent. Atherosclerotic disease right cavernous carotid with mild stenosis. Left cavernous carotid widely patent. Moderate stenosis A2 segment bilaterally. Mild atherosclerotic disease left middle cerebral artery M1 and M2 segments. Mild disease in right MCA branches 7 mm aneurysm right MCA bifurcation. MRA NECK FINDINGS Antegrade flow in the carotid arteries and vertebral arteries bilaterally. Right carotid bifurcation widely patent. Mild stenosis proximal left internal and external carotid arteries. Both vertebral arteries patent without proximal stenosis. 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 Atrophy and moderate to advanced chronic microvascular ischemia Negative for large vessel occlusion. Diffuse intracranial atherosclerotic disease as above. 7 mm aneurysm right MCA bifurcation Mild stenosis left carotid bifurcation in the neck. Electronically Signed   By: Marlan Palau M.D.   On: 02/20/2019 14:37   Mr Max[facility] Glenn Neck Wo Contrast  Result Date: 02/20/2019 CLINICAL DATA:  Stroke.  Left-sided weakness. EXAM: MRI HEAD WITHOUT CONTRAST MRA HEAD WITHOUT CONTRAST MRA NECK WITHOUT CONTRAST TECHNIQUE: Multiplanar, multiecho pulse sequences of the brain and surrounding structures were obtained without intravenous contrast. Angiographic images of the Circle of Willis were obtained using MRA technique without intravenous contrast. Angiographic images of the neck were obtained using MRA technique without intravenous contrast. Carotid stenosis measurements (when  applicable) are obtained utilizing NASCET criteria, using the distal internal carotid diameter as the denominator. COMPARISON:  CT head 03/29/2017 FINDINGS: MRI HEAD FINDINGS Brain: Small white matter infarct in the right corona radiata. No other acute infarct identified. Mild atrophy. Moderate to extensive chronic microvascular ischemic changes in the white matter and pons. Chronic infarcts in the basal ganglia and head of caudate on the right. Chronic microhemorrhage in the high right parietal lobe. No hematoma or fluid collection or mass lesion. No midline shift. Vascular: Normal arterial flow voids. Flow void right MCA bifurcation compatible with aneurysm. See MRA findings below. Skull and upper cervical spine: Negative Sinuses/Orbits: Mild mucosal edema paranasal sinuses.  Normal orbit Other: None MRA HEAD FINDINGS Severe stenosis distal left vertebral artery. Mild to moderate stenosis distal right vertebral artery and mild stenosis distal left vertebral artery. PICA patent bilaterally. Atherosclerotic basilar without significant stenosis. Superior cerebellar arteries widely patent bilaterally. Atherosclerotic disease with sequential moderate stenosis ease in the left PCA. Right PCA patent. Atherosclerotic disease right cavernous carotid with mild stenosis. Left cavernous carotid widely patent. Moderate stenosis A2 segment bilaterally. Mild atherosclerotic disease left middle cerebral artery M1 and M2 segments. Mild disease in right MCA branches 7 mm aneurysm right MCA bifurcation. MRA NECK FINDINGS Antegrade flow in the carotid arteries and vertebral arteries bilaterally. Right carotid bifurcation widely patent. Mild stenosis proximal left internal and external carotid arteries. Both vertebral arteries patent without proximal stenosis. 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 Atrophy and moderate to advanced chronic microvascular ischemia Negative for large  vessel occlusion. Diffuse intracranial atherosclerotic disease as above. 7 mm aneurysm right MCA bifurcation Mild stenosis left carotid bifurcation in the neck. Electronically Signed   By: Marlan Palau M.D.   On: 02/20/2019 14:37   Mr Brain Wo Contrast  Result Date: 02/20/2019 CLINICAL DATA:  Stroke.  Left-sided weakness. EXAM: MRI HEAD WITHOUT CONTRAST MRA HEAD WITHOUT CONTRAST MRA NECK WITHOUT CONTRAST TECHNIQUE: Multiplanar, multiecho pulse sequences of the brain and surrounding structures were obtained without intravenous contrast. Angiographic images of the Circle of Willis were obtained using MRA technique without intravenous contrast. Angiographic images of the neck were obtained  using MRA technique without intravenous contrast. Carotid stenosis measurements (when applicable) are obtained utilizing NASCET criteria, using the distal internal carotid diameter as the denominator. COMPARISON:  CT head 03/29/2017 FINDINGS: MRI HEAD FINDINGS Brain: Small white matter infarct in the right corona radiata. No other acute infarct identified. Mild atrophy. Moderate to extensive chronic microvascular ischemic changes in the white matter and pons. Chronic infarcts in the basal ganglia and head of caudate on the right. Chronic microhemorrhage in the high right parietal lobe. No hematoma or fluid collection or mass lesion. No midline shift. Vascular: Normal arterial flow voids. Flow void right MCA bifurcation compatible with aneurysm. See MRA findings below. Skull and upper cervical spine: Negative Sinuses/Orbits: Mild mucosal edema paranasal sinuses.  Normal orbit Other: None MRA HEAD FINDINGS Severe stenosis distal left vertebral artery. Mild to moderate stenosis distal right vertebral artery and mild stenosis distal left vertebral artery. PICA patent bilaterally. Atherosclerotic basilar without significant stenosis. Superior cerebellar arteries widely patent bilaterally. Atherosclerotic disease with sequential  moderate stenosis ease in the left PCA. Right PCA patent. Atherosclerotic disease right cavernous carotid with mild stenosis. Left cavernous carotid widely patent. Moderate stenosis A2 segment bilaterally. Mild atherosclerotic disease left middle cerebral artery M1 and M2 segments. Mild disease in right MCA branches 7 mm aneurysm right MCA bifurcation. MRA NECK FINDINGS Antegrade flow in the carotid arteries and vertebral arteries bilaterally. Right carotid bifurcation widely patent. Mild stenosis proximal left internal and external carotid arteries. Both vertebral arteries patent without proximal stenosis. 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 Atrophy and moderate to advanced chronic microvascular ischemia Negative for large vessel occlusion. Diffuse intracranial atherosclerotic disease as above. 7 mm aneurysm right MCA bifurcation Mild stenosis left carotid bifurcation in the neck. Electronically Signed   By: Marlan Palauharles  Clark M.D.   On: 02/20/2019 14:37     Meds ordered this encounter  Medications  .  stroke: mapping our early stages of recovery book  . OR Linked Order Group   . aspirin suppository 300 mg   . aspirin tablet 325 mg  . DISCONTD: oxyCODONE-acetaminophen (PERCOCET/ROXICET) 5-325 MG per tablet 1-2 tablet  . traMADol (ULTRAM) tablet 50 mg  . cyclobenzaprine (FLEXERIL) tablet 10 mg  . diphenhydrAMINE (BENADRYL) capsule 25 mg  . enoxaparin (LOVENOX) injection 40 mg  . 0.9 %  sodium chloride infusion  . OR Linked Order Group   . acetaminophen (TYLENOL) tablet 650 mg   . acetaminophen (TYLENOL) solution 650 mg   . acetaminophen (TYLENOL) suppository 650 mg  . senna-docusate (Senokot-S) tablet 1 tablet  . atorvastatin (LIPITOR) tablet 40 mg     MDM  Pt presenting with left sided weakness, last known normal 11:30 PM last night. Baseline labs as well as MRI/MRA Brain and neck ordered. Dr. Jerrell BelfastAurora with neuro aware of patient and has  evaluated them prior to imaging returning. MRI brain with acute infarct. Dr. Madilyn Hookees to consult hospitalist for admission. Updated patient on plan; she is in agreement with plan at this time and stable.   MDM Number of Diagnoses or Management Options Acute CVA (cerebrovascular accident) [facility](HCC):          Tanda RockersVenter, Zariel Capano, PA-C 02/20/19 1734    Tilden Fossaees, Elizabeth, MD 02/22/19 [phone]

## 2019-02-20 NOTE — ED Notes (Signed)
Pt passed the stroke swallow test with no difficulty before she was transported to the floor.

## 2019-02-21 DIAGNOSIS — I6381 Other cerebral infarction due to occlusion or stenosis of small artery: Principal | ICD-10-CM

## 2019-02-21 DIAGNOSIS — I671 Cerebral aneurysm, nonruptured: Secondary | ICD-10-CM

## 2019-02-21 LAB — LIPID PANEL
Cholesterol: 169 mg/dL (ref 0–200)
HDL: 56 mg/dL (ref >40)
LDL Cholesterol: 98 mg/dL (ref 0–99)
Total CHOL/HDL Ratio: 3 RATIO
Triglycerides: 73 mg/dL (ref <150)
VLDL: 15 mg/dL (ref 0–40)

## 2019-02-21 LAB — HEMOGLOBIN A1C
Hgb A1c MFr Bld: 5.5 % (ref 4.8–5.6)
Mean Plasma Glucose: 111.15 mg/dL

## 2019-02-21 MED ORDER — CLOPIDOGREL BISULFATE 75 MG PO TABS
75.0000 mg | ORAL_TABLET | Freq: Every day | ORAL | Status: DC
  Administered 2019-02-21 – 2019-02-23 (×3): 75 mg via ORAL
  Filled 2019-02-21 (×3): qty 1

## 2019-02-21 MED ORDER — SODIUM CHLORIDE 0.9 % IV SOLN
INTRAVENOUS | Status: AC
  Administered 2019-02-22 (×2): via INTRAVENOUS

## 2019-02-21 MED ORDER — ASPIRIN EC 81 MG PO TBEC
81.0000 mg | DELAYED_RELEASE_TABLET | Freq: Every day | ORAL | Status: DC
  Administered 2019-02-21 – 2019-02-23 (×3): 81 mg via ORAL
  Filled 2019-02-21 (×3): qty 1

## 2019-02-21 MED ORDER — ENSURE ENLIVE PO LIQD
237.0000 mL | Freq: Two times a day (BID) | ORAL | Status: DC
  Administered 2019-02-21 – 2019-02-23 (×4): 237 mL via ORAL

## 2019-02-21 MED ORDER — AMLODIPINE BESYLATE 10 MG PO TABS
10.0000 mg | ORAL_TABLET | Freq: Every day | ORAL | Status: DC
  Administered 2019-02-21 – 2019-02-23 (×3): 10 mg via ORAL
  Filled 2019-02-21 (×3): qty 1

## 2019-02-21 NOTE — Evaluation (Signed)
Speech Language Pathology Evaluation Patient Details Name: Connie MarusRebecca Oconnor MRN: [id number] DOB: [birth year] Today's Date: 02/21/2019 Time: [id number] SLP Time Calculation (min) (ACUTE ONLY): 46 min  Problem List:  Patient Active Problem List   Diagnosis Date Noted  . 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 Hypothyroidism (acquired) 02/20/2019  . Closed fracture of medial portion of right tibial plateau 01/11/2018  . Acute medial meniscal tear, right, initial encounter 01/11/2018  . SAH (subarachnoid hemorrhage) (HCC) 03/29/2017   Past Medical History:  Past Medical History:  Diagnosis Date  . Anemia    hx of  . Arthritis   . Dislocation of right knee with medial meniscus tear   . Headache    hx of migraines none since 1990's  . Hypothyroidism    1978, no problems since  . Pneumonia 1990's none since  . PVC (premature ventricular contraction)    199'0's none since  . SAH (subarachnoid hemorrhage) (HCC) 2016   On the right   Past Surgical History:  Past Surgical History:  Procedure Laterality Date  . BACK SURGERY    . CERVICAL LAMINECTOMY  1986  . DILATION AND CURETTAGE OF UTERUS  1970  . ELBOW SURGERY Right 1993  . KNEE ARTHROSCOPY WITH SUBCHONDROPLASTY Right 01/11/2018   Procedure: KNEE ARTHROSCOPY WITH SUBCHONDROPLASTY, PARTIAL MEDIAL MENISCECTOMY, CHONDROPLASTY OF THE MEDIAL PATELLA FEMORAL CHONDYL, MEDIAL PLICA  EXCISION;  Surgeon: Jodi GeraldsGraves, John, MD;  Location: [facility];  Service: Orthopedics;  Laterality: Right;  . LUMBAR LAMINECTOMY  1993   rods placed bone generated removed one year later   . MULTIPLE TOOTH EXTRACTIONS    . SHOULDER SURGERY Left    arthritis clean out and shortened collar bone dr Renae Ficklepaul  . TONSILLECTOMY  1970  . VAGINAL HYSTERECTOMY  1971   partial dr Dewaine Congerbarker  . WISDOM TOOTH EXTRACTION     HPI:  Pt is a 78 y.o. female with medical history significant of R SAH from a fall (03/2017) and hypothyroidism who presented with left-sided weakness.  She reported that she couldn't get out of the bed on 02/20/19 and when she did, she slid into the floor. MRI of the brain showed small acute infarct in the right corona radiata and 7 mm aneurysm right MCA bifurcation.    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 Her daughter, Arline AspCindy, was contacted via phone to assist in provision of history. The pt's daughter expressed that the pt did demonstrate some "confusion" at the onset of symptoms and that it sounded "like she was drunk" but that these symptoms have improved since admission. The pt and her daughter agreed that her speech is currently approximately 80% back to baseline. [facility] was completed to evaluate the pt's cognitive-linguistic skills. Her score was adjusted since her fine motor deficits prohibited her from completing the Visuospatial/Executive section of the evaluation. She achieved an adjusted score of 17/25 which is below the normal limits and is suggestive of a mild-moderate impairment. She demonstrated deficits in the areas of memory, divergent naming, and abstract reasoning. 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 Continued skilled SLP services are clinically indicated at this time to improve dysarthria and cognitive-linguistic skills.     SLP Assessment  SLP Recommendation/Assessment: Patient needs continued Speech Lanaguage Pathology Services SLP Visit Diagnosis: Dysarthria and  anarthria (R47.1);Cognitive communication deficit (R41.841)    Follow Up Recommendations  Other (comment)(Continued SLP services at level of care rx by PT/OT)    Frequency and Duration min 2x/week  2 weeks      SLP Evaluation Cognition  Overall Cognitive Status:  Impaired/Different from baseline Arousal/Alertness: Awake/alert Orientation Level: Oriented to person;Oriented to place;Oriented to situation Attention: Focused;Sustained Focused Attention: Appears intact(Vigilance WNL: 1/1) Sustained Attention: Appears intact(Serial 7s: 3/3) Memory: Impaired Memory Impairment: Decreased recall of new information;Retrieval deficit(Immediate words: 4/5; Delayed: 0/5; with cues: 5/5) Awareness: Appears intact Problem Solving: Appears intact(4/4) Executive Function: Reasoning Reasoning: Impaired Reasoning Impairment: Verbal complex(Abstraction; 0/2)       Comprehension  Auditory Comprehension Overall Auditory Comprehension: Appears within functional limits for tasks assessed Yes/No Questions: Within Functional Limits Conversation: Complex Reading Comprehension Reading Status: Not tested    Expression Expression Primary Mode of Expression: Verbal Verbal Expression Overall Verbal Expression: Appears within functional limits for tasks assessed Initiation: No impairment Level of Generative/Spontaneous Verbalization: Conversation Repetition: No impairment(Sentence repetition: 2/2) Naming: No impairment Responsive: Not tested Confrontation: (3/3) Convergent: Not tested Divergent: (0/1) Written Expression Dominant Hand: Left Written Expression: Unable to assess (comment)(due to left-sided weakness)   Oral / Motor  Oral Motor/Sensory Function Overall Oral Motor/Sensory Function: Mild impairment Facial ROM: Reduced left Facial Symmetry: Within Functional Limits Facial Strength: Reduced left;Suspected CN VII (facial) dysfunction Facial Sensation: Within Functional Limits Lingual ROM: Within Functional Limits Lingual Symmetry: Within Functional Limits Lingual Strength: Reduced;Suspected CN XII (hypoglossal) dysfunction Lingual Sensation: Within Functional Limits Motor Speech Overall Motor Speech: Impaired Respiration: Within functional  limits Phonation: Hoarse Resonance: Within functional limits Articulation: Impaired Level of Impairment: Sentence Intelligibility: Intelligibility reduced Word: (100) Phrase: (100) Sentence: 75-100% accurate Conversation: 75-100% accurate Motor Planning: Witnin functional limits Motor Speech Errors: Not applicable   Messina Kosinski I. Vear Clock, MS, CCC-SLP Acute Rehabilitation Services Office number [phone] Pager [phone]                    Scheryl Marten 02/21/2019, 9:58 AM

## 2019-02-21 NOTE — Progress Notes (Signed)
Dr. Natale Milch paged regarding allergy to aspirin. Pt states she "feels jittery" when taking aspirin. Pt refused 1000 scheduled aspirin. Nurse will continue to monitor. Connie Oconnor Connie Oconnor

## 2019-02-21 NOTE — Evaluation (Signed)
Occupational Therapy Evaluation Patient Details Name: Connie MarusRebecca Hynson MRN: [id number] DOB: 1941-03-03 Today's Date: 02/21/2019    History of Present Illness Connie Oconnor is a 78 y.o. female with past medical history of PVC, hypothyroidism, arthritis, migraines, h/o back surgery several years ago and has used walker/cane since, continued back pain.  Patient states that she went to bed at approximately 11:30 PM last night to which she was normal.  When patient awoke this morning she slid out of her bed secondary to left-sided weakness.  She continues to have left-sided weakness at this time.  Patient was brought to the hospital for further evaluation of possible stroke.  Patient states that she is allergic to aspirin and thus does not take it.  Her allergy is she feels shakiness.  She has never been on Plavix.  MRI shows R lacunar infarct.    Clinical Impression   Pt seen for OT eval today. Pt eager to participate and improve. Pt with L dominant hemiplegia, decreased balance, decreased activity tolerance, decreased functional mobility, h/o of back pain all impacting pt's level of independence for ADL and IADL activities. Pt will benefit from continued skilled OT to address these deficits and feel pt would benefit from CIR prior to d/c home. 2 daughters in the area can provide 24/7 supervision and assist per pt.      Follow Up Recommendations  CIR    Equipment Recommendations  Other (comment)(defer to next level. Pt has shower seat and raise toilet as well as grab bars at home)    Recommendations for Other Services Rehab consult     Precautions / Restrictions Precautions Precautions: Fall Restrictions Weight Bearing Restrictions: No      Mobility Bed Mobility Overal bed mobility: Needs Assistance Bed Mobility: Rolling;Sidelying to Sit;Sit to Supine Rolling: Min assist Sidelying to sit: Mod assist   Sit to supine: Min assist      Transfers Overall transfer level: Needs  assistance Equipment used: 1 person hand held assist Transfers: Stand Pivot Transfers   Stand pivot transfers: Mod assist            Balance Overall balance assessment: Needs assistance Sitting-balance support: No upper extremity supported;Feet supported Sitting balance-Leahy Scale: Fair(close supervison to min guard)   Postural control: Left lateral lean Standing balance support: Single extremity supported(by therapist) Standing balance-Leahy Scale: Poor(min a for static standing and mod a for dynamic stepping)                             ADL either performed or assessed with clinical judgement   ADL Overall ADL's : Needs assistance/impaired Eating/Feeding: Set up;Minimal assistance(for cutting. Using non dominant hand to self feed)   Grooming: Wash/dry hands;Wash/dry face;Minimal assistance;Set up   Upper Body Bathing: Moderate assistance;Sitting Upper Body Bathing Details (indicate cue type and reason): close supervision for sitting balance and assist due to decreased LUE function Lower Body Bathing: Sit to/from stand;Moderate assistance   Upper Body Dressing : Moderate assistance Upper Body Dressing Details (indicate cue type and reason): due to sitting balance (pt with left bias with dynamic sitting) and decreased LUE function Lower Body Dressing: Maximal assistance   Toilet Transfer: Minimal assistance;Stand-pivot Toilet Transfer Details (indicate cue type and reason): moderate assistance for ambulatory transfer using cane Toileting- Clothing Manipulation and Hygiene: Moderate assistance       Functional mobility during ADLs: Moderate assistance General ADL Comments: Pt with decreased ability for full thoracic extension and states  this was difficult before. Pt with L bias in standing - pt aware of bias but had difficulty shifting to midline.       Vision Baseline Vision/History: No visual deficits Patient Visual Report: No change from baseline Vision  Assessment?: Yes;No apparent visual deficits     Perception     Praxis      Pertinent Vitals/Pain Pain Assessment: 0-10 Pain Score: 4  Pain Location: back pt has long history of back pain Pain Descriptors / Indicators: Aching Pain Intervention(s): Monitored during session;Repositioned     Hand Dominance Left   Extremity/Trunk Assessment Upper Extremity Assessment Upper Extremity Assessment: LUE deficits/detail LUE Deficits / Details: Pt with Brunnstrom level 3 in arm and 3-4 in left hand. Pt states she only had shoulder flexion in L arm to about 90* before and used her Right arm to assist her L arm for things like combing her hair and reaching up.  Pt with full elbow flexion/extenstion, supination/pronation, wrist flexion and extension and grasp and release however grasp and release is weak.  All movements in LUE are slowed and strength 2+/5.  Pt states "it doesn't work" however pt is able to hold light objects, touch her nose with LUE with encouragement and cues. Sensation for light touch appears intact LUE Sensation: WNL(for light touch) LUE Coordination: decreased fine motor;decreased gross motor   Lower Extremity Assessment Lower Extremity Assessment: Defer to PT evaluation       Communication Communication Communication: No difficulties   Cognition Arousal/Alertness: Awake/alert Behavior During Therapy: WFL for tasks assessed/performed Overall Cognitive Status: Within Functional Limits for tasks assessed(will continue to monitor; pt able to state that she wasn't allowed to get up by herself)                                     General Comments  Pt with L bias in standing, flexed posture. Pt able to feel L bias but had difficulty weight shifting to midline. Pt states she has used a RW or cane since her back surgery over 10 years ago but feels her balance is worse.     Exercises     Shoulder Instructions      Home Living Family/patient expects to be  discharged to:: Private residence Living Arrangements: Alone Available Help at Discharge: Available 24 hours/day(pt has 2 daughters in the near by area and pt states they can come to stay with her and assist her physically if needed) Type of Home: House                 Bathroom Toilet: Handicapped height(pt has raised toilet seat on toilet)     Home Equipment: Walker - 2 wheels;Cane - single point;Toilet riser;Shower seat;Grab bars - tub/shower      Lives With: Alone    Prior Functioning/Environment Level of Independence: Independent                 OT Problem List: Decreased strength;Decreased range of motion;Decreased activity tolerance;Impaired balance (sitting and/or standing);Decreased coordination;Decreased knowledge of use of DME or AE;Impaired sensation;Impaired UE functional use      OT Treatment/Interventions: Self-care/ADL training;Therapeutic exercise;Neuromuscular education;DME and/or AE instruction;Therapeutic activities;Patient/family education;Balance training    OT Goals(Current goals can be found in the care plan section) Acute Rehab OT Goals Patient Stated Goal: I want my arm to move better OT Goal Formulation: With patient Time For Goal Achievement: 03/07/19 Potential to Achieve  Goals: Good  OT Frequency: Min 3X/week   Barriers to D/C:    pt has good family support ( 2 daughters in the area who can assist upon d/c)       Co-evaluation              AM-PAC OT "6 Clicks" Daily Activity     Outcome Measure Help from another person eating meals?: A Little Help from another person taking care of personal grooming?: A Little Help from another person toileting, which includes using toliet, bedpan, or urinal?: A Lot Help from another person bathing (including washing, rinsing, drying)?: A Lot Help from another person to put on and taking off regular upper body clothing?: A Lot Help from another person to put on and taking off regular lower body  clothing?: A Lot 6 Click Score: 14   End of Session Equipment Utilized During Treatment: Gait belt  Activity Tolerance: Patient tolerated treatment well Patient left: in bed;with call bell/phone within reach;with bed alarm set  OT Visit Diagnosis: Unsteadiness on feet (R26.81);Muscle weakness (generalized) (M62.81);Hemiplegia and hemiparesis Hemiplegia - Right/Left: Left Hemiplegia - dominant/non-dominant: Dominant Hemiplegia - caused by: Cerebral infarction                Time: 6578-4696 OT Time Calculation (min): 33 min Charges:  OT Evaluation $OT Eval Moderate Complexity: 1 Mod OT Treatments $Self Care/Home Management : 8-22 mins    Norton Pastel, OTR/L 02/21/2019, 10:34 AM

## 2019-02-21 NOTE — TOC Initial Note (Signed)
Transition of Care [facility]) - Initial/Assessment Note    Patient Details  Name: Connie Oconnor MRN: [id number] Date of Birth: December 22, 1940  [facility] CM/SW Contact:    Kermit Balo, RN Phone Number: 02/21/2019, 3:18 PM  Clinical Narrative:                 Awaiting PT eval. CM following for d/c disposition.  Expected Discharge Plan: IP Rehab Facility Barriers to Discharge: Continued Medical Work up   Patient Goals and CMS Choice        Expected Discharge Plan and Services Expected Discharge Plan: IP Rehab Facility   Discharge Planning Services: CM Consult Post Acute Care Choice: IP Rehab Living arrangements for the past 2 months: Single Family Home(one level and has a ramp)                          Prior Living Arrangements/Services Living arrangements for the past 2 months: Single Family Home(one level and has a ramp) Lives with:: Self Patient language and need for interpreter reviewed:: Yes(no needs) Do you feel safe going back to the place where you live?: Yes      Need for Family Participation in Patient Care: Yes (Comment)(will need family support after rehab stay) Care giver support system in place?: Yes (comment)(Pt states her daughters/ grandsons will be able to provide 24 hour supervision.) Current home services: DME(cane, walker, shower seat, commode riser, grab bars in the shower) Criminal Activity/Legal Involvement Pertinent to Current Situation/Hospitalization: No - Comment as needed  Activities of Daily Living      Permission Sought/Granted                  Emotional Assessment Appearance:: Appears stated age Attitude/Demeanor/Rapport: Engaged Affect (typically observed): Accepting, Pleasant, Appropriate Orientation: : Oriented to Self, Oriented to Place, Oriented to  Time, Oriented to Situation   Psych Involvement: No (comment)  Admission diagnosis:  weakness Patient Active Problem List   Diagnosis Date Noted  .  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 Hypothyroidism (acquired) 02/20/2019  . Closed fracture of medial portion of right tibial plateau 01/11/2018  . Acute medial meniscal tear, right, initial encounter 01/11/2018  . SAH (subarachnoid hemorrhage) (HCC) 03/29/2017   PCP:  Lovenia Kim, PA-C Pharmacy:   [facility] [phone] - [place], [place] - [address] [place] [address] Phone: [phone] Fax: [phone]     Social Determinants of Health (SDOH) Interventions    Readmission Risk Interventions No flowsheet data found.

## 2019-02-21 NOTE — Progress Notes (Signed)
STROKE TEAM PROGRESS NOTE   INTERVAL HISTORY Pt sitting in bed, having breakfast.  Awake, alert, orientated.  Admits to that she had a stroke this time however also concerning about right MCA bifurcation 7 mm aneurysm.  She agreed with further diagnostic angiography tomorrow.  Still has mild left sided weakness  Vitals:   02/20/19 2210 02/21/19 0038 02/21/19 0428 02/21/19 0758  BP: (!) 144/89 128/75 (!) 153/57 (!) 148/70  Pulse: 80 70 62 66  Resp: 14 12 19 18   Temp: 98.4 F (36.9 C) 98.3 F (36.8 C) 97.8 F (36.6 C) 97.9 F (36.6 C)  TempSrc: Oral Oral Oral Oral  SpO2: 94% 95% 100% 96%  Weight:      Height:        CBC:  Recent Labs  Lab 02/20/19 1242  WBC 6.9  NEUTROABS 5.5  HGB 14.1  HCT 44.6  MCV 88.3  PLT 291    Basic Metabolic Panel:  Recent Labs  Lab 02/20/19 1242  NA 141  K 3.8  CL 104  CO2 25  GLUCOSE 99  BUN 8  CREATININE 0.89  CALCIUM 9.4   Lipid Panel:     Component Value Date/Time   CHOL 169 02/21/2019 0512   TRIG 73 02/21/2019 0512   HDL 56 02/21/2019 0512   CHOLHDL 3.0 02/21/2019 0512   VLDL 15 02/21/2019 0512   LDLCALC 98 02/21/2019 0512   HgbA1c:  Lab Results  Component Value Date   HGBA1C 5.5 02/21/2019   Urine Drug Screen:     Component Value Date/Time   LABOPIA NONE DETECTED 02/20/2019 1434   COCAINSCRNUR NONE DETECTED 02/20/2019 1434   LABBENZ NONE DETECTED 02/20/2019 1434   AMPHETMU NONE DETECTED 02/20/2019 1434   THCU NONE DETECTED 02/20/2019 1434   LABBARB NONE DETECTED 02/20/2019 1434    Alcohol Level     Component Value Date/Time   [facility] 02/20/2019 1242    IMAGING Mr Maxine GlennMra Head Wo Contrast  Result Date: 02/20/2019 CLINICAL DATA:  Stroke.  Left-sided weakness. EXAM: MRI HEAD WITHOUT CONTRAST MRA HEAD WITHOUT CONTRAST MRA NECK WITHOUT CONTRAST TECHNIQUE: Multiplanar, multiecho pulse sequences of the brain and surrounding structures were obtained without intravenous contrast. Angiographic images of the Circle of  [place] were obtained using MRA technique without intravenous contrast. Angiographic images of the neck were obtained using MRA technique without intravenous contrast. Carotid stenosis measurements (when applicable) are obtained utilizing NASCET criteria, using the distal internal carotid diameter as the denominator. COMPARISON:  CT head 03/29/2017 FINDINGS: MRI HEAD FINDINGS Brain: Small white matter infarct in the right corona radiata. No other acute infarct identified. Mild atrophy. Moderate to extensive chronic microvascular ischemic changes in the white matter and pons. Chronic infarcts in the basal ganglia and head of caudate on the right. Chronic microhemorrhage in the high right parietal lobe. No hematoma or fluid collection or mass lesion. No midline shift. Vascular: Normal arterial flow voids. Flow void right MCA bifurcation compatible with aneurysm. See MRA findings below. Skull and upper cervical spine: Negative Sinuses/Orbits: Mild mucosal edema paranasal sinuses.  Normal orbit Other: None MRA HEAD FINDINGS Severe stenosis distal left vertebral artery. Mild to moderate stenosis distal right vertebral artery and mild stenosis distal left vertebral artery. PICA patent bilaterally. Atherosclerotic basilar without significant stenosis. Superior cerebellar arteries widely patent bilaterally. Atherosclerotic disease with sequential moderate stenosis ease in the left PCA. Right PCA patent. Atherosclerotic disease right cavernous carotid with mild stenosis. Left cavernous carotid widely patent. Moderate stenosis A2 segment bilaterally.  Mild atherosclerotic disease left middle cerebral artery M1 and M2 segments. Mild disease in right MCA branches 7 mm aneurysm right MCA bifurcation. MRA NECK FINDINGS Antegrade flow in the carotid arteries and vertebral arteries bilaterally. Right carotid bifurcation widely patent. Mild stenosis proximal left internal and external carotid arteries. Both vertebral arteries patent  without proximal stenosis. 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 Atrophy and moderate to advanced chronic microvascular ischemia Negative for large vessel occlusion. Diffuse intracranial atherosclerotic disease as above. 7 mm aneurysm right MCA bifurcation Mild stenosis left carotid bifurcation in the neck. Electronically Signed   By: Marlan Palau M.D.   On: 02/20/2019 14:37   Mr Maxine Glenn Neck Wo Contrast  Result Date: 02/20/2019 CLINICAL DATA:  Stroke.  Left-sided weakness. EXAM: MRI HEAD WITHOUT CONTRAST MRA HEAD WITHOUT CONTRAST MRA NECK WITHOUT CONTRAST TECHNIQUE: Multiplanar, multiecho pulse sequences of the brain and surrounding structures were obtained without intravenous contrast. Angiographic images of the Circle of [place] were obtained using MRA technique without intravenous contrast. Angiographic images of the neck were obtained using MRA technique without intravenous contrast. Carotid stenosis measurements (when applicable) are obtained utilizing NASCET criteria, using the distal internal carotid diameter as the denominator. COMPARISON:  CT head 03/29/2017 FINDINGS: MRI HEAD FINDINGS Brain: Small white matter infarct in the right corona radiata. No other acute infarct identified. Mild atrophy. Moderate to extensive chronic microvascular ischemic changes in the white matter and pons. Chronic infarcts in the basal ganglia and head of caudate on the right. Chronic microhemorrhage in the high right parietal lobe. No hematoma or fluid collection or mass lesion. No midline shift. Vascular: Normal arterial flow voids. Flow void right MCA bifurcation compatible with aneurysm. See MRA findings below. Skull and upper cervical spine: Negative Sinuses/Orbits: Mild mucosal edema paranasal sinuses.  Normal orbit Other: None MRA HEAD FINDINGS Severe stenosis distal left vertebral artery. Mild to moderate stenosis distal right vertebral artery and mild stenosis  distal left vertebral artery. PICA patent bilaterally. Atherosclerotic basilar without significant stenosis. Superior cerebellar arteries widely patent bilaterally. Atherosclerotic disease with sequential moderate stenosis ease in the left PCA. Right PCA patent. Atherosclerotic disease right cavernous carotid with mild stenosis. Left cavernous carotid widely patent. Moderate stenosis A2 segment bilaterally. Mild atherosclerotic disease left middle cerebral artery M1 and M2 segments. Mild disease in right MCA branches 7 mm aneurysm right MCA bifurcation. MRA NECK FINDINGS Antegrade flow in the carotid arteries and vertebral arteries bilaterally. Right carotid bifurcation widely patent. Mild stenosis proximal left internal and external carotid arteries. Both vertebral arteries patent without proximal stenosis. 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 Atrophy and moderate to advanced chronic microvascular ischemia Negative for large vessel occlusion. Diffuse intracranial atherosclerotic disease as above. 7 mm aneurysm right MCA bifurcation Mild stenosis left carotid bifurcation in the neck. Electronically Signed   By: Marlan Palau M.D.   On: 02/20/2019 14:37   Mr Brain Wo Contrast  Result Date: 02/20/2019 CLINICAL DATA:  Stroke.  Left-sided weakness. EXAM: MRI HEAD WITHOUT CONTRAST MRA HEAD WITHOUT CONTRAST MRA NECK WITHOUT CONTRAST TECHNIQUE: Multiplanar, multiecho pulse sequences of the brain and surrounding structures were obtained without intravenous contrast. Angiographic images of the Circle of [place] were obtained using MRA technique without intravenous contrast. Angiographic images of the neck were obtained using MRA technique without intravenous contrast. Carotid stenosis measurements (when applicable) are obtained utilizing NASCET criteria, using the distal internal carotid diameter as the denominator. COMPARISON:  CT head 03/29/2017 FINDINGS: MRI HEAD  FINDINGS Brain: Small white matter infarct in the right corona radiata. No other acute infarct identified. Mild atrophy. Moderate to extensive chronic microvascular ischemic changes in the white matter and pons. Chronic infarcts in the basal ganglia and head of caudate on the right. Chronic microhemorrhage in the high right parietal lobe. No hematoma or fluid collection or mass lesion. No midline shift. Vascular: Normal arterial flow voids. Flow void right MCA bifurcation compatible with aneurysm. See MRA findings below. Skull and upper cervical spine: Negative Sinuses/Orbits: Mild mucosal edema paranasal sinuses.  Normal orbit Other: None MRA HEAD FINDINGS Severe stenosis distal left vertebral artery. Mild to moderate stenosis distal right vertebral artery and mild stenosis distal left vertebral artery. PICA patent bilaterally. Atherosclerotic basilar without significant stenosis. Superior cerebellar arteries widely patent bilaterally. Atherosclerotic disease with sequential moderate stenosis ease in the left PCA. Right PCA patent. Atherosclerotic disease right cavernous carotid with mild stenosis. Left cavernous carotid widely patent. Moderate stenosis A2 segment bilaterally. Mild atherosclerotic disease left middle cerebral artery M1 and M2 segments. Mild disease in right MCA branches 7 mm aneurysm right MCA bifurcation. MRA NECK FINDINGS Antegrade flow in the carotid arteries and vertebral arteries bilaterally. Right carotid bifurcation widely patent. Mild stenosis proximal left internal and external carotid arteries. Both vertebral arteries patent without proximal stenosis. 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 Atrophy and moderate to advanced chronic microvascular ischemia Negative for large vessel occlusion. Diffuse intracranial atherosclerotic disease as above. 7 mm aneurysm right MCA bifurcation Mild stenosis left carotid bifurcation in the neck.  Electronically Signed   By: Marlan Palau M.D.   On: 02/20/2019 14:37   2D Echocardiogram  1. The left ventricle has normal systolic function with an ejection fraction of 60-65%. The cavity size was normal. Left ventricular diastolic Doppler parameters are indeterminate. No evidence of left ventricular regional wall motion abnormalities.  2. The right ventricle has normal systolic function. The cavity was normal. There is no increase in right ventricular wall thickness. Right ventricular systolic pressure could not be assessed.  3. The aortic valve is tricuspid.  4. No intracardiac thrombi or masses were visualized.   PHYSICAL EXAM  Temp:  [97.8 F (36.6 C)-98.6 F (37 C)] 98.1 F (36.7 C) (04/22 1136) Pulse Rate:  [62-80] 66 (04/22 1136) Resp:  [12-19] 18 (04/22 1136) BP: (128-174)/(57-89) 164/63 (04/22 1136) SpO2:  [94 %-100 %] 98 % (04/22 1136)  General - Well nourished, well developed, in no apparent distress.  Ophthalmologic - fundi not visualized due to noncooperation.  Cardiovascular - Regular rate and rhythm.  Mental Status -  Level of arousal and orientation to time, place, and person were intact. Language including expression, naming, repetition, comprehension was assessed and found intact. Fund of Knowledge was assessed and was intact.  Cranial Nerves II - XII - II - Visual field intact OU. III, IV, VI - Extraocular movements intact. V - Facial sensation intact bilaterally. VII - mild left nasolabial fold flattening. VIII - Hearing & vestibular intact bilaterally. X - Palate elevates symmetrically. XI - Chin turning & shoulder shrug intact bilaterally. XII - Tongue protrusion intact.  Motor Strength - The patient's strength was normal in right upper and lower extremities, however, left upper extremity 4/5 proximal and 3-/5 finger grip, left lower extremity 4+/5 proximal and 5/5 distal and pronator drift was present on the left.  Bulk was normal and fasciculations  were absent.   Motor  Tone - Muscle tone was assessed at the neck and appendages and was normal.  Reflexes - The patient's reflexes were symmetrical in all extremities and she had no pathological reflexes.  Sensory - Light touch, temperature/pinprick were assessed and were symmetrical.    Coordination - The patient had normal movements in the hands with no ataxia or dysmetria.  Tremor was absent.  Gait and Station - deferred.   ASSESSMENT/PLAN Ms. Connie Oconnor is a 78 y.o. female with history of PVC, hypothyroidism, arthritis, migraines presenting with L sided weakness.   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 Diffuse IC atherosclerosis. 7mm R MCA aneurysm  MRA neck mild stenosis L ICA bifurcation  2D Echo EF 60-65%. No source of embolus   LDL 98  HgbA1c 5.5  Lovenox 40 mg sq daily for VTE prophylaxis  No antithrombotic prior to admission, now on aspirin 81 mg daily and clopidogrel 75 mg daily. Hx feeling "jittery" when she takes full dose aspirin.    Therapy recommendations:  CIR. 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 by Dr. 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 Feliberto Harts on repeat CT  Current CT showed traumatic SAH has been resolved  Elevated BP  No hx HTN  Home meds:  None  BP on the higher end . Add amlodipine   . Long-term BP goal normotensive  Hyperlipidemia  Home meds:  No statin  Now on lipitor 40  LDL 98, goal < 70  Continue statin at discharge  Tobacco abuse  Current smoker  Smoking cessation counseling provided  One of the risk factor for cerebral aneurysm  Pt is willing to quit  Other Stroke Risk Factors  Advanced  age  Family hx stroke (mother)  Migraines - none since the 85s  Other Active Problems  Hypothyroidism on synthroid  Arthritis  PVCs   Hospital day # 1   Marvel Plan, MD PhD Stroke Neurology 02/21/2019 12:25 PM    To contact Stroke Continuity provider, please refer to [email or web address]. After hours, contact General Neurology

## 2019-02-21 NOTE — Evaluation (Signed)
Physical Therapy Evaluation Patient Details Name: Connie Oconnor MRN: [id number] DOB: Apr 02, 1941 Today's Date: 02/21/2019   History of Present Illness  Connie Oconnor is a 78 y.o. female with past medical history of PVC, hypothyroidism, arthritis, migraines, h/o back surgery several years ago and has used walker/cane since, continued back pain.  Patient states that she went to bed at approximately 11:30 PM last night to which she was normal.  When patient awoke this morning she slid out of her bed secondary to left-sided weakness.  She continues to have left-sided weakness at this time.  Patient was brought to the hospital for further evaluation of possible stroke.  Patient states that she is allergic to aspirin and thus does not take it.  Her allergy is she feels shakiness.  She has never been on Plavix.  MRI shows R lacunar infarct.     Clinical Impression  Pt presented supine in bed with HOB elevated, awake and willing to participate in therapy session. Prior to admission, pt reported that she ambulated with use of a RW and was independent with ADLs. Pt lives alone in a single level home with a ramped entrance. She stated that she will have 24/7 supervision/assistance upon d/c. At the time of evaluation, pt required min guard for bed mobility, close min guard to maintain upright sitting at EOB and min-mod A for transfers. Pt would continue to benefit from skilled physical therapy services at this time while admitted and after d/c to address the below listed limitations in order to improve overall safety and independence with functional mobility.     Follow Up Recommendations CIR    Equipment Recommendations  None recommended by PT    Recommendations for Other Services Rehab consult     Precautions / Restrictions Precautions Precautions: Fall Restrictions Weight Bearing Restrictions: No      Mobility  Bed Mobility Overal bed mobility: Needs Assistance Bed Mobility: Supine to  Sit;Sit to Supine     Supine to sit: Min guard Sit to supine: Min assist   General bed mobility comments: increased time and effort, use of bed rail with R UE, min A to return L LE onto bed  Transfers Overall transfer level: Needs assistance Equipment used: None Transfers: Sit to/from [employer] Sit to Stand: Mod assist;Min assist Stand pivot transfers: Min assist       General transfer comment: excessive forward trunk flexion when transitioning into standing from EOB x2; min A to pivot from bed to [facility] and back bilaterally  Ambulation/Gait             General Gait Details: pt able to take lateral steps at EOB with 1HHA and min-mod A for stability  Stairs            Wheelchair Mobility    Modified Rankin (Stroke Patients Only) Modified Rankin (Stroke Patients Only) Pre-Morbid Rankin Score: Moderate disability Modified Rankin: Moderately severe disability     Balance Overall balance assessment: Needs assistance Sitting-balance support: Feet supported Sitting balance-Leahy Scale: Fair Sitting balance - Comments: min guard for safety; cueing for midline orientation Postural control: Left lateral lean Standing balance support: Single extremity supported;Bilateral upper extremity supported Standing balance-Leahy Scale: Poor                               Pertinent Vitals/Pain Pain Assessment: No/denies pain    Home Living Family/patient expects to be discharged to:: Private residence Living Arrangements: Alone  Available Help at Discharge: Family;Available 24 hours/day Type of Home: House Home Access: Ramped entrance     Home Layout: One level Home Equipment: Emergency planning/management officerhower seat;Walker - 2 wheels;Cane - single point;Wheelchair - manual      Prior Function Level of Independence: Independent with assistive device(s)         Comments: ambulating with a RW     Hand Dominance   Dominant Hand: Left    Extremity/Trunk  Assessment   Upper Extremity Assessment Upper Extremity Assessment: Defer to OT evaluation    Lower Extremity Assessment Lower Extremity Assessment: LLE deficits/detail LLE Deficits / Details: pt with mild strength deficits compared to R LE; MMT revealed 3/5 for hip flexion, 5/5 for knee extension, 4/5 for knee flexion and 4/5 for ankle DF       Communication   Communication: No difficulties  Cognition Arousal/Alertness: Awake/alert Behavior During Therapy: WFL for tasks assessed/performed Overall Cognitive Status: Impaired/Different from baseline Area of Impairment: Safety/judgement;Problem solving                         Safety/Judgement: Decreased awareness of deficits;Decreased awareness of safety   Problem Solving: Difficulty sequencing;Requires verbal cues        General Comments      Exercises     Assessment/Plan    PT Assessment Patient needs continued PT services  PT Problem List Decreased strength;Decreased activity tolerance;Decreased balance;Decreased mobility;Decreased cognition;Decreased coordination;Decreased knowledge of use of DME;Decreased safety awareness;Decreased knowledge of precautions       PT Treatment Interventions DME instruction;Gait training;Stair training;Functional mobility training;Therapeutic activities;Therapeutic exercise;Neuromuscular re-education;Balance training;Patient/family education    PT Goals (Current goals can be found in the Care Plan section)  Acute Rehab PT Goals Patient Stated Goal: to return to independence PT Goal Formulation: With patient Time For Goal Achievement: 03/07/19 Potential to Achieve Goals: Good    Frequency Min 4X/week   Barriers to discharge        Co-evaluation               AM-PAC PT "6 Clicks" Mobility  Outcome Measure Help needed turning from your back to your side while in a flat bed without using bedrails?: A Little Help needed moving from lying on your back to sitting on  the side of a flat bed without using bedrails?: A Little Help needed moving to and from a bed to a chair (including a wheelchair)?: A Lot Help needed standing up from a chair using your arms (e.g., wheelchair or bedside chair)?: A Lot Help needed to walk in hospital room?: A Lot Help needed climbing 3-5 steps with a railing? : Total 6 Click Score: 13    End of Session Equipment Utilized During Treatment: Gait belt Activity Tolerance: Patient tolerated treatment well Patient left: in bed;with call bell/phone within reach;with bed alarm set Nurse Communication: Mobility status PT Visit Diagnosis: Other abnormalities of gait and mobility (R26.89);Muscle weakness (generalized) (M62.81)    Time: [id number] PT Time Calculation (min) (ACUTE ONLY): 31 min   Charges:   PT Evaluation $PT Eval Moderate Complexity: 1 Mod PT Treatments $Therapeutic Activity: 8-22 mins        Deborah ChalkJennifer Eryx Zane, PT, DPT  Acute Rehabilitation Services Pager [phone] Office [phone]    Alessandra BevelsJennifer M Oluwadamilola Deliz 02/21/2019, 3:43 PM

## 2019-02-21 NOTE — H&P (Addendum)
History and Physical    Connie Oconnor ZOX:[id number] DOB: [birth year] DOA: 02/20/2019  PCP: Lovenia Kim, PA-C Consultants:  Luiz Blare - orthopedics Patient coming from:  Home - lives alone; NOK: Daughter, Nat Math, [phone]  Chief Complaint: left-sided weakness  HPI: Connie Oconnor is a 78 y.o. female with medical history significant of right SAH from a fall (03/2017) and hypothyroidism presenting with left-sided weakness. She reports difficulty getting out of the bed this morning and when she did, she slid into the floor.  She couldn't get to the bathroom and wet the floor.  She finally managed to get ahold of her daughter and they called 911.  Her left arm and left leg were weak but she really couldn't use them for standing and she couldn't even push herself up to a sitting position in bed.  She was normal last night when she went to bed.  She did not wake up to void overnight.  No dysphagia but she had some mild dysarthria which she attributes to not having her teeth in her mouth.  She was told she was a little droopy on her face.  No prior h/o strokes. Left hemiparesis upon wakening, MRI shows a stroke (lacunar infarct in the right corona radiata).  Neurology has seen her.  Review of Systems: No acute issues or events overnight, patient continues to complain of left upper extremity weakness but remarks that her left lower extremity weakness appears to be somewhat improving.   Ambulatory Status:  Ambulates with a cane at baseline  Past Medical History:  Diagnosis Date   Anemia    hx of   Arthritis    Dislocation of right knee with medial meniscus tear    Headache    hx of migraines none since 1990's   Hypothyroidism    1978, no problems since   Pneumonia 1990's none since   PVC (premature ventricular contraction)    199'0's none since   SAH (subarachnoid hemorrhage) (HCC) 2016   On the right    Past Surgical History:  Procedure Laterality Date   BACK SURGERY      CERVICAL LAMINECTOMY  1986   DILATION AND CURETTAGE OF UTERUS  1970   ELBOW SURGERY Right 1993   KNEE ARTHROSCOPY WITH SUBCHONDROPLASTY Right 01/11/2018   Procedure: KNEE ARTHROSCOPY WITH SUBCHONDROPLASTY, PARTIAL MEDIAL MENISCECTOMY, CHONDROPLASTY OF THE MEDIAL PATELLA FEMORAL CHONDYL, MEDIAL PLICA  EXCISION;  Surgeon: Jodi Geralds, MD;  Location: [facility];  Service: Orthopedics;  Laterality: Right;   LUMBAR LAMINECTOMY  1993   rods placed bone generated removed one year later    MULTIPLE TOOTH EXTRACTIONS     SHOULDER SURGERY Left    arthritis clean out and shortened collar bone dr Renae Fickle   TONSILLECTOMY  1970   VAGINAL HYSTERECTOMY  1971   partial dr Bunnie Domino TOOTH EXTRACTION      Social History   Socioeconomic History   Marital status: Widowed    Spouse name: Not on file   Number of children: Not on file   Years of education: Not on file   Highest education level: Not on file  Occupational History   Occupation: retired  Ecologist strain: Not on file   Food insecurity:    Worry: Not on file    Inability: Not on file   Transportation needs:    Medical: Not on file    Non-medical: Not on file  Tobacco Use   Smoking status: Current Every Day Smoker  Packs/day: 1.00    Years: 72.00    Pack years: 72.00    Types: Cigarettes   Smokeless tobacco: Never Used  Substance and Sexual Activity   Alcohol use: No   Drug use: No   Sexual activity: Not on file  Lifestyle   Physical activity:    Days per week: Not on file    Minutes per session: Not on file   Stress: Not on file  Relationships   Social connections:    Talks on phone: Not on file    Gets together: Not on file    Attends religious service: Not on file    Active member of club or organization: Not on file    Attends meetings of clubs or organizations: Not on file    Relationship status: Not on file   Intimate partner violence:    Fear of current or ex  partner: Not on file    Emotionally abused: Not on file    Physically abused: Not on file    Forced sexual activity: Not on file  Other Topics Concern   Not on file  Social History Narrative   Not on file    Allergies  Allergen Reactions   Aspirin Other (See Comments)    shaking   Ibuprofen Other (See Comments)    Shaking   Penicillins Rash and Other (See Comments)    Has patient had a PCN reaction causing immediate rash, facial/tongue/throat swelling, SOB or lightheadedness with hypotension: No Has patient had a PCN reaction causing severe rash involving mucus membranes or skin necrosis: No Has patient had a PCN reaction that required hospitalization: Yes - was in hospital Has patient had a PCN reaction occurring within the last 10 years: No If all of the above answers are "NO", then may proceed with Cephalosporin use.     Family History  Problem Relation Age of Onset   Stroke Mother    Heart attack Father     Prior to Admission medications   Medication Sig Start Date End Date Taking? Authorizing Provider  acetaminophen (TYLENOL) 500 MG tablet Take 1,000 mg by mouth every 6 (six) hours as needed for mild pain or headache.     [provider]  cephALEXin (KEFLEX) 500 MG capsule Take 1 capsule (500 mg total) by mouth 4 (four) times daily. 03/15/18   Donnetta Hutching, MD  cyclobenzaprine (FLEXERIL) 10 MG tablet Take 10 mg by mouth 3 (three) times daily as needed for muscle spasms.    [provider]  diphenhydrAMINE (BENADRYL) 25 MG tablet Take 25 mg by mouth at bedtime as needed for allergies.     [provider]  glucosamine-chondroitin 500-400 MG tablet Take 1 tablet by mouth at bedtime.     [provider]  oxyCODONE-acetaminophen (PERCOCET/ROXICET) 5-325 MG tablet Take 1-2 tablets by mouth every 6 (six) hours as needed for severe pain. 01/11/18   Marshia Ly, PA-C  traMADol (ULTRAM) 50 MG tablet Take 50 mg by mouth every 8 (eight) hours  as needed for moderate pain.    [provider]    Physical Exam: Vitals:   02/21/19 0038 02/21/19 0428 02/21/19 0758 02/21/19 1136  BP: 128/75 (!) 153/57 (!) 148/70 (!) 164/63  Pulse: 70 62 66 66  Resp: Temp: 98.3 F (36.8 C) 97.8 F (36.6 C) 97.9 F (36.6 C) 98.1 F (36.7 C)  TempSrc: Oral Oral Oral Oral  SpO2: 95% 100% 96% 98%  Weight:  Height:         General:  Pleasantly resting in bed, No acute distress. HEENT:  Normocephalic atraumatic.  Sclerae nonicteric, noninjected.  Extraocular movements intact bilaterally. Neck:  Without mass or deformity.  Trachea is midline. Lungs:  Clear to auscultate bilaterally without rhonchi, wheeze, or rales. Heart:  Regular rate and rhythm.  Without murmurs, rubs, or gallops. Abdomen:  Soft, nontender, nondistended.  Without guarding or rebound. Extremities: Without cyanosis, clubbing, edema, or obvious deformity. Vascular:  Dorsalis pedis and posterior tibial pulses palpable bilaterally. Neuro: Left upper extremity proximal strength 5 out of 5, distal left upper extremity grip strength 2 out of 5 with equally weak 2 out of 5 strength flexors and extensors of the wrist.  Left lower extremity 4 out of 5 strength compared to right 5 out of 5 strength globally wise. Skin:  Warm and dry, no erythema, no ulcerations.   Radiological Exams on Admission: Mr Shirlee Latch FF Contrast  Result Date: 02/20/2019 CLINICAL DATA:  Stroke.  Left-sided weakness. EXAM: MRI HEAD WITHOUT CONTRAST MRA HEAD WITHOUT CONTRAST MRA NECK WITHOUT CONTRAST TECHNIQUE: Multiplanar, multiecho pulse sequences of the brain and surrounding structures were obtained without intravenous contrast. Angiographic images of the Circle of Willis were obtained using MRA technique without intravenous contrast. Angiographic images of the neck were obtained using MRA technique without intravenous contrast. Carotid stenosis measurements (when applicable) are obtained  utilizing NASCET criteria, using the distal internal carotid diameter as the denominator. COMPARISON:  CT head 03/29/2017 FINDINGS: MRI HEAD FINDINGS Brain: Small white matter infarct in the right corona radiata. No other acute infarct identified. Mild atrophy. Moderate to extensive chronic microvascular ischemic changes in the white matter and pons. Chronic infarcts in the basal ganglia and head of caudate on the right. Chronic microhemorrhage in the high right parietal lobe. No hematoma or fluid collection or mass lesion. No midline shift. Vascular: Normal arterial flow voids. Flow void right MCA bifurcation compatible with aneurysm. See MRA findings below. Skull and upper cervical spine: Negative Sinuses/Orbits: Mild mucosal edema paranasal sinuses.  Normal orbit Other: None MRA HEAD FINDINGS Severe stenosis distal left vertebral artery. Mild to moderate stenosis distal right vertebral artery and mild stenosis distal left vertebral artery. PICA patent bilaterally. Atherosclerotic basilar without significant stenosis. Superior cerebellar arteries widely patent bilaterally. Atherosclerotic disease with sequential moderate stenosis ease in the left PCA. Right PCA patent. Atherosclerotic disease right cavernous carotid with mild stenosis. Left cavernous carotid widely patent. Moderate stenosis A2 segment bilaterally. Mild atherosclerotic disease left middle cerebral artery M1 and M2 segments. Mild disease in right MCA branches 7 mm aneurysm right MCA bifurcation. MRA NECK FINDINGS Antegrade flow in the carotid arteries and vertebral arteries bilaterally. Right carotid bifurcation widely patent. Mild stenosis proximal left internal and external carotid arteries. Both vertebral arteries patent without proximal stenosis. 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 Atrophy and moderate to advanced chronic microvascular ischemia Negative for large vessel occlusion. Diffuse  intracranial atherosclerotic disease as above. 7 mm aneurysm right MCA bifurcation Mild stenosis left carotid bifurcation in the neck. Electronically Signed   By: Marlan Palau M.D.   On: 02/20/2019 14:37   Mr Maxine Glenn Neck Wo Contrast  Result Date: 02/20/2019 CLINICAL DATA:  Stroke.  Left-sided weakness. EXAM: MRI HEAD WITHOUT CONTRAST MRA HEAD WITHOUT CONTRAST MRA NECK WITHOUT CONTRAST TECHNIQUE: Multiplanar, multiecho pulse sequences of the brain and surrounding structures were obtained without intravenous contrast. Angiographic images of the Circle of Willis  were obtained using MRA technique without intravenous contrast. Angiographic images of the neck were obtained using MRA technique without intravenous contrast. Carotid stenosis measurements (when applicable) are obtained utilizing NASCET criteria, using the distal internal carotid diameter as the denominator. COMPARISON:  CT head 03/29/2017 FINDINGS: MRI HEAD FINDINGS Brain: Small white matter infarct in the right corona radiata. No other acute infarct identified. Mild atrophy. Moderate to extensive chronic microvascular ischemic changes in the white matter and pons. Chronic infarcts in the basal ganglia and head of caudate on the right. Chronic microhemorrhage in the high right parietal lobe. No hematoma or fluid collection or mass lesion. No midline shift. Vascular: Normal arterial flow voids. Flow void right MCA bifurcation compatible with aneurysm. See MRA findings below. Skull and upper cervical spine: Negative Sinuses/Orbits: Mild mucosal edema paranasal sinuses.  Normal orbit Other: None MRA HEAD FINDINGS Severe stenosis distal left vertebral artery. Mild to moderate stenosis distal right vertebral artery and mild stenosis distal left vertebral artery. PICA patent bilaterally. Atherosclerotic basilar without significant stenosis. Superior cerebellar arteries widely patent bilaterally. Atherosclerotic disease with sequential moderate stenosis ease in  the left PCA. Right PCA patent. Atherosclerotic disease right cavernous carotid with mild stenosis. Left cavernous carotid widely patent. Moderate stenosis A2 segment bilaterally. Mild atherosclerotic disease left middle cerebral artery M1 and M2 segments. Mild disease in right MCA branches 7 mm aneurysm right MCA bifurcation. MRA NECK FINDINGS Antegrade flow in the carotid arteries and vertebral arteries bilaterally. Right carotid bifurcation widely patent. Mild stenosis proximal left internal and external carotid arteries. Both vertebral arteries patent without proximal stenosis. 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 Atrophy and moderate to advanced chronic microvascular ischemia Negative for large vessel occlusion. Diffuse intracranial atherosclerotic disease as above. 7 mm aneurysm right MCA bifurcation Mild stenosis left carotid bifurcation in the neck. Electronically Signed   By: Marlan Palau M.D.   On: 02/20/2019 14:37   Mr Brain Wo Contrast  Result Date: 02/20/2019 CLINICAL DATA:  Stroke.  Left-sided weakness. EXAM: MRI HEAD WITHOUT CONTRAST MRA HEAD WITHOUT CONTRAST MRA NECK WITHOUT CONTRAST TECHNIQUE: Multiplanar, multiecho pulse sequences of the brain and surrounding structures were obtained without intravenous contrast. Angiographic images of the Circle of Willis were obtained using MRA technique without intravenous contrast. Angiographic images of the neck were obtained using MRA technique without intravenous contrast. Carotid stenosis measurements (when applicable) are obtained utilizing NASCET criteria, using the distal internal carotid diameter as the denominator. COMPARISON:  CT head 03/29/2017 FINDINGS: MRI HEAD FINDINGS Brain: Small white matter infarct in the right corona radiata. No other acute infarct identified. Mild atrophy. Moderate to extensive chronic microvascular ischemic changes in the white matter and pons. Chronic infarcts in  the basal ganglia and head of caudate on the right. Chronic microhemorrhage in the high right parietal lobe. No hematoma or fluid collection or mass lesion. No midline shift. Vascular: Normal arterial flow voids. Flow void right MCA bifurcation compatible with aneurysm. See MRA findings below. Skull and upper cervical spine: Negative Sinuses/Orbits: Mild mucosal edema paranasal sinuses.  Normal orbit Other: None MRA HEAD FINDINGS Severe stenosis distal left vertebral artery. Mild to moderate stenosis distal right vertebral artery and mild stenosis distal left vertebral artery. PICA patent bilaterally. Atherosclerotic basilar without significant stenosis. Superior cerebellar arteries widely patent bilaterally. Atherosclerotic disease with sequential moderate stenosis ease in the left PCA. Right PCA patent. Atherosclerotic disease right cavernous carotid with mild stenosis. Left cavernous carotid widely patent. Moderate stenosis A2  segment bilaterally. Mild atherosclerotic disease left middle cerebral artery M1 and M2 segments. Mild disease in right MCA branches 7 mm aneurysm right MCA bifurcation. MRA NECK FINDINGS Antegrade flow in the carotid arteries and vertebral arteries bilaterally. Right carotid bifurcation widely patent. Mild stenosis proximal left internal and external carotid arteries. Both vertebral arteries patent without proximal stenosis. 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 Atrophy and moderate to advanced chronic microvascular ischemia Negative for large vessel occlusion. Diffuse intracranial atherosclerotic disease as above. 7 mm aneurysm right MCA bifurcation Mild stenosis left carotid bifurcation in the neck. Electronically Signed   By: Marlan Palauharles  Clark M.D.   On: 02/20/2019 14:37    EKG: Independently reviewed.  NSR with rate 65; nonspecific ST changes with no evidence of acute ischemia  Assessment/Plan Principal Problem:   Right-sided  lacunar infarction [facility](HCC) Active Problems:   SAH (subarachnoid hemorrhage) (HCC)   Hypothyroidism (acquired)   Acute CVA - R lacunar infarct -Patient with acute onset of left-sided hemiparesis this AM -Uncertain time of onset and h/o SAH and so not a candidate for TPA -MRI is positive for CVA -Carotid dopplers, Echo performed, waiting formal read -Risk stratification with       -FLP(unremarkable), A1c(5.5); TSH (3.02, T4 0.7; both borderline low/normal) -Patient indicates aspirin makes her "jittery"; -continues to refuse dosing here -Currently holding Plavix as below for ? Procedure tomorrow - will resume once cleared by IR - addendum - IR approved asa/plavix - will need to hold lovenox. -Neurology consulted -appreciate insight and recommendations -PT/OT/ST/Nutrition Consults -CIR consult for placement; she lives alone and appears very likely to benefit from CIR  Incidental 7mm R MCA bifurcation aneurysm -Neuro IR following - recommending further evaluation/intervention per patient/nursing -Will hold lovenox and plavix tonight given possible procedure in the am - NPO at midnight  HTN, reactive vs essential -Previously allowing for permissive hypertension -No history of HTN per patient - unclear if reactive 2/2 acute CVA as above -Blood pressure improving today, she does not currently take any blood pressure medications   HLD -Fasting lipid panel unremarkable -Continue Lipitor 40 mg daily  H/o SAH -Remote and associated with a fall -It should be ok to use DVT prevention-dose Lovenox in this patient  Hypothyroidism  -Check TSH and free T4 -Continue Synthroid at current dose for now  DVT prophylaxis:  Lovenox (on hold tonight due to Neuro IR procedure planned 4/23 am) Code Status: DNR Family Communication: None present Disposition Plan:  Likely to benefit from CIR Consults called: Neurology; CIR; PT/OT/ST/Nutrition  Admission status:  Remains inpatient.    Azucena FallenWilliam C  Clair Bardwell MD [facility]  02/21/2019, 3:16 PM

## 2019-02-21 NOTE — Progress Notes (Signed)
Pt noted to have 1st degree hr block via telemetry. Dr. Clearence Ped informed.

## 2019-02-21 NOTE — Progress Notes (Signed)
Aware of IR request for possible image-guided diagnostic cerebral arteriogram.  Plan for procedure 02/22/2019 with Dr. Corliss Skains. Will consent in IR RN station tomorrow prior to procedure. Will be NPO at midnight. Hold Lovenox dose today. Ok to proceed with Plavix/Aspirin (continue administrating as directed). Adelina Mings, RN aware of above.  Please call IR with questions/concerns.  Waylan Boga Taneika Choi, PA-C 02/21/2019, 12:40 PM

## 2019-02-21 NOTE — Progress Notes (Signed)
Initial Nutrition Assessment   RD working remotely.  DOCUMENTATION CODES:   Not applicable  INTERVENTION:  Provide Ensure Enlive po BID, each supplement provides 350 kcal and 20 grams of protein.  Encourage adequate PO intake.   NUTRITION DIAGNOSIS:   Increased nutrient needs related to acute illness as evidenced by estimated needs.  GOAL:   Patient will meet greater than or equal to 90% of their needs  MONITOR:   PO intake, Labs, Supplement acceptance, Weight trends, I & O's, Skin  REASON FOR ASSESSMENT:   Consult Assessment of nutrition requirement/status  ASSESSMENT:   78 y.o. female with history of PVC, hypothyroidism, arthritis, migraines presenting with L sided weakness. MRI small R CR infarct, 19mm R MCA aneurysm.  Plans for cerebral arteriogram tomorrow. Meal completion 50%. Pt reports having a good appetite with no difficulties. Usual body weight unknown. RD to order nutritional supplements to aid in caloric and protein needs. Pt encouraged to eat her food at meals and to drink her supplements.   Unable to complete Nutrition-Focused physical exam at this time.   Labs and medications reviewed.   Diet Order:   Diet Order            Diet NPO time specified  Diet effective now        Diet Heart Room service appropriate? Yes with Assist; Fluid consistency: Thin  Diet effective ____              EDUCATION NEEDS:   Not appropriate for education at this time  Skin:  Skin Assessment: Reviewed RN Assessment  Last BM:  4/21  Height:   Ht Readings from Last 1 Encounters:  02/20/19 5\' 5"  (1.651 m)    Weight:   Wt Readings from Last 1 Encounters:  02/20/19 70.3 kg    Ideal Body Weight:  56.8 kg  BMI:  Body mass index is 25.79 kg/m.  Estimated Nutritional Needs:   Kcal:  1750-1900  Protein:  80-90 grams  Fluid:  1.7 - 1.9 L/day    Roslyn Smiling, MS, RD, LDN Pager # [phone] After hours/ weekend pager # [phone]

## 2019-02-21 NOTE — Progress Notes (Signed)
Inpatient Rehabilitation Admissions Coordinator  Inpatient Rehab Consult received. I met with patient at the bedside as she was working with therapy. I will follow up tomorrow for complete rehab assessment. Pt is appropriate candidate for an inpt rehab admit.   Danne Baxter, RN, MSN Rehab Admissions Coordinator [phone] 02/21/2019 2:25 PM

## 2019-02-22 ENCOUNTER — Inpatient Hospital Stay (HOSPITAL_COMMUNITY): Payer: Medicare Other

## 2019-02-22 ENCOUNTER — Other Ambulatory Visit: Payer: Self-pay

## 2019-02-22 DIAGNOSIS — I671 Cerebral aneurysm, nonruptured: Secondary | ICD-10-CM

## 2019-02-22 HISTORY — PX: IR ANGIO INTRA EXTRACRAN SEL COM CAROTID INNOMINATE BILAT MOD SED: IMG5360

## 2019-02-22 HISTORY — PX: IR ANGIO VERTEBRAL SEL SUBCLAVIAN INNOMINATE UNI L MOD SED: IMG5364

## 2019-02-22 HISTORY — PX: IR ANGIO VERTEBRAL SEL VERTEBRAL UNI R MOD SED: IMG5368

## 2019-02-22 LAB — CBC WITH DIFFERENTIAL/PLATELET
Abs Immature Granulocytes: 0.01 10*3/uL (ref 0.00–0.07)
Basophils Absolute: 0 10*3/uL (ref 0.0–0.1)
Basophils Relative: 1 %
Eosinophils Absolute: 0.2 10*3/uL (ref 0.0–0.5)
Eosinophils Relative: 3 %
HCT: 42.6 % (ref 36.0–46.0)
Hemoglobin: 14 g/dL (ref 12.0–15.0)
Immature Granulocytes: 0 %
Lymphocytes Relative: 27 %
Lymphs Abs: 1.5 10*3/uL (ref 0.7–4.0)
MCH: 28.2 pg (ref 26.0–34.0)
MCHC: 32.9 g/dL (ref 30.0–36.0)
MCV: 85.7 fL (ref 80.0–100.0)
Monocytes Absolute: 0.6 10*3/uL (ref 0.1–1.0)
Monocytes Relative: 10 %
Neutro Abs: 3.2 10*3/uL (ref 1.7–7.7)
Neutrophils Relative %: 59 %
Platelets: 279 10*3/uL (ref 150–400)
RBC: 4.97 MIL/uL (ref 3.87–5.11)
RDW: 13 % (ref 11.5–15.5)
WBC: 5.5 10*3/uL (ref 4.0–10.5)
nRBC: 0 % (ref 0.0–0.2)

## 2019-02-22 LAB — BASIC METABOLIC PANEL
Anion gap: 11 (ref 5–15)
BUN: 10 mg/dL (ref 8–23)
CO2: 24 mmol/L (ref 22–32)
Calcium: 9.1 mg/dL (ref 8.9–10.3)
Chloride: 106 mmol/L (ref 98–111)
Creatinine, Ser: 0.82 mg/dL (ref 0.44–1.00)
GFR calc Af Amer: >60 mL/min (ref >60)
GFR calc non Af Amer: >60 mL/min (ref >60)
Glucose, Bld: 105 mg/dL — ABNORMAL HIGH (ref 70–99)
Potassium: 3.6 mmol/L (ref 3.5–5.1)
Sodium: 141 mmol/L (ref 135–145)

## 2019-02-22 IMAGING — XA BILATERAL COMMON CAROTID AND INNOMINATE ANGIOGRAPHY
1 of 2 series · 12 of 24 positions shown · IV contrast (IODINE)
Comparison: MRI MRA of the brain of [DATE].

CLINICAL DATA: Recent history of a right-sided ischemic stroke with
left-sided weakness. Discovery of a right middle cerebral artery
aneurysm.

EXAM:
BILATERAL COMMON CAROTID AND INNOMINATE ANGIOGRAPHY; IR ANGIO
VERTEBRAL SEL SUBCLAVIAN INNOMINATE UNI LEFT MOD SED; IR ANGIO
VERTEBRAL SEL VERTEBRAL UNI RIGHT MOD SED
TECHNIQUE: Informed written consent was obtained from the patient after a
thorough discussion of the procedural risks, benefits and
alternatives. All questions were addressed. Maximal Sterile Barrier
Technique was utilized including caps, mask, sterile gowns, sterile
gloves, sterile drape, hand hygiene and skin antiseptic. A timeout
was performed prior to the initiation of the procedure.

[Series 300: dr. (person_name) · 12 of 176 slices shown]
[im 1/176]
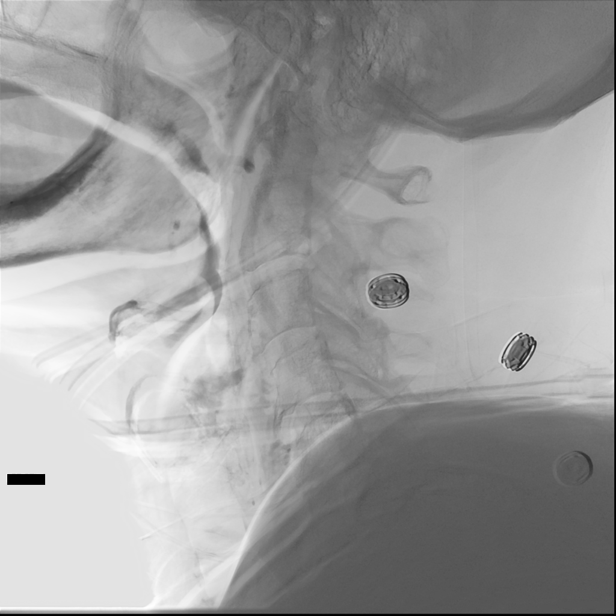
[im 16/176]
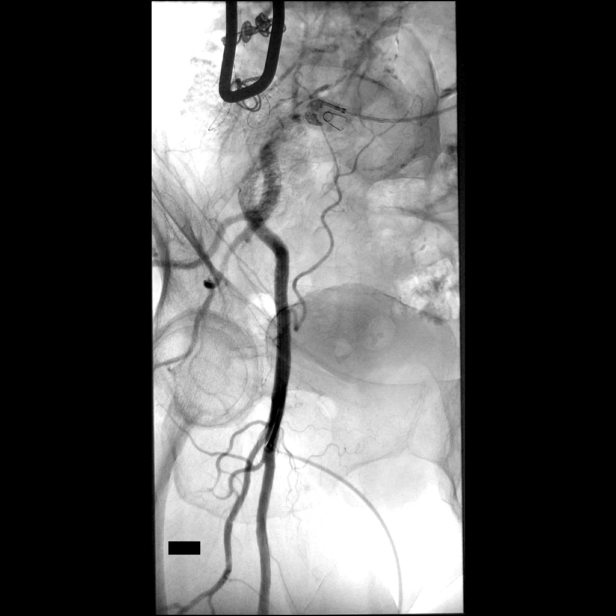
[im 32/176]
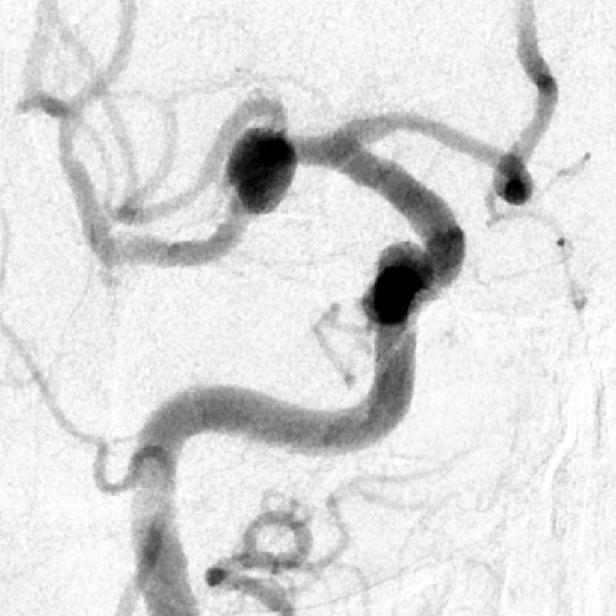
[im 48/176]
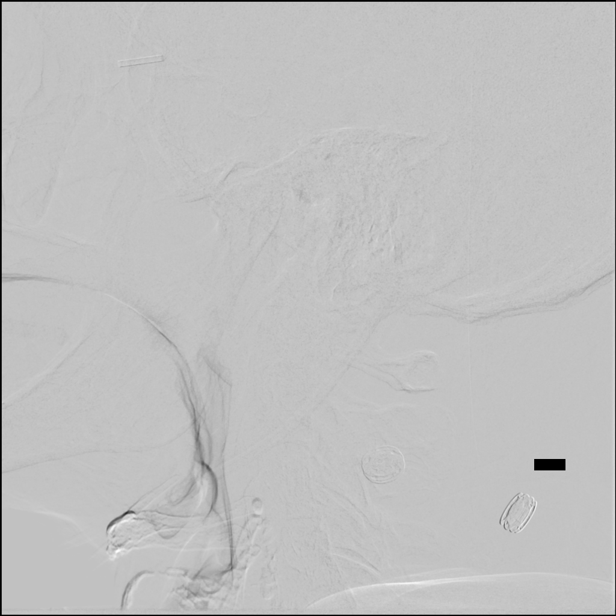
[im 64/176]
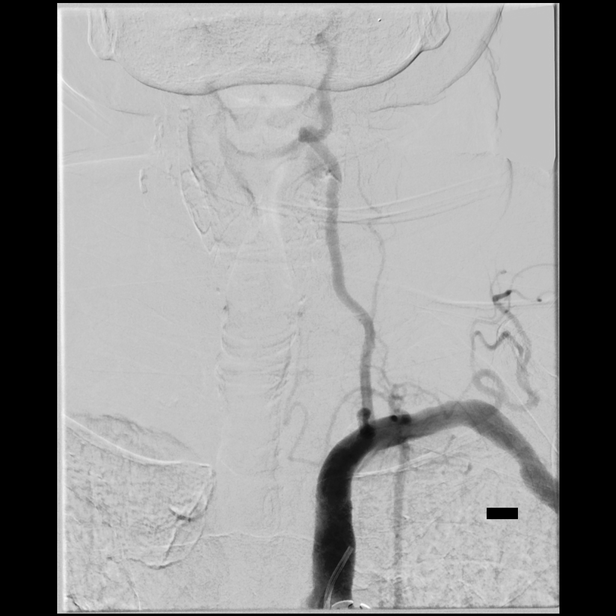
[im 80/176]
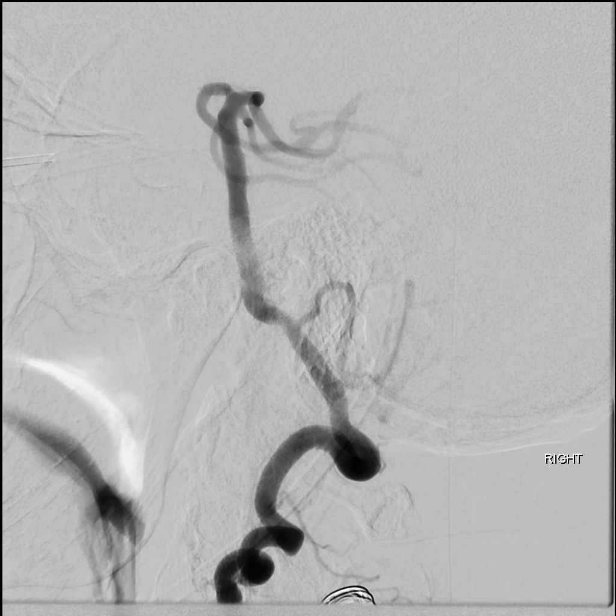
[im 96/176]
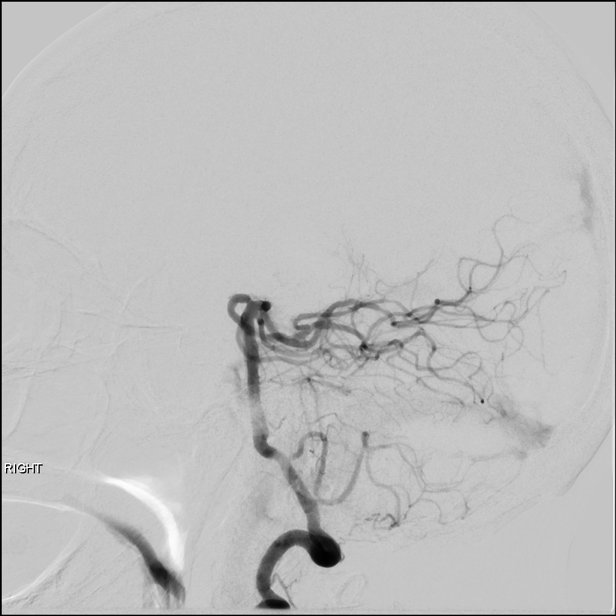
[im 112/176]
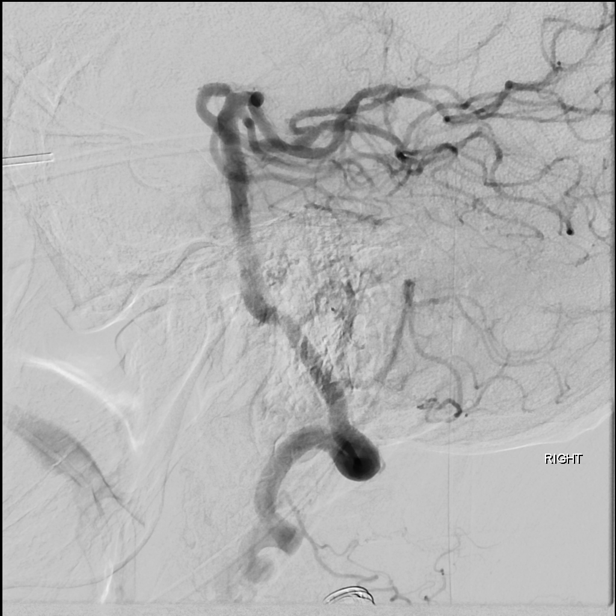
[im 128/176]
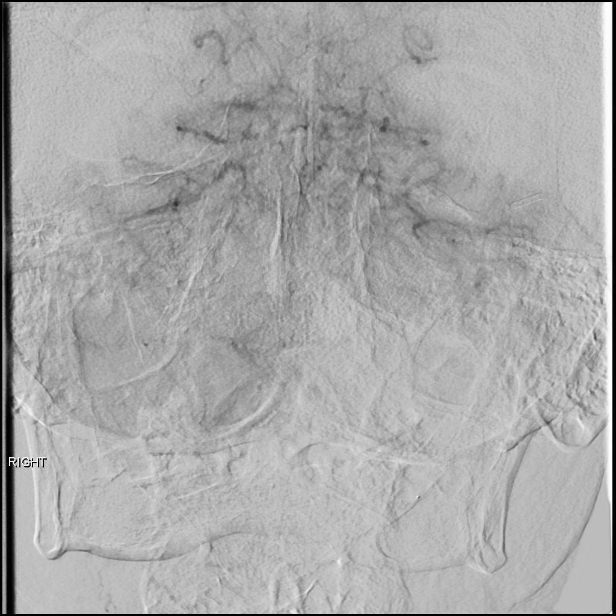
[im 144/176]
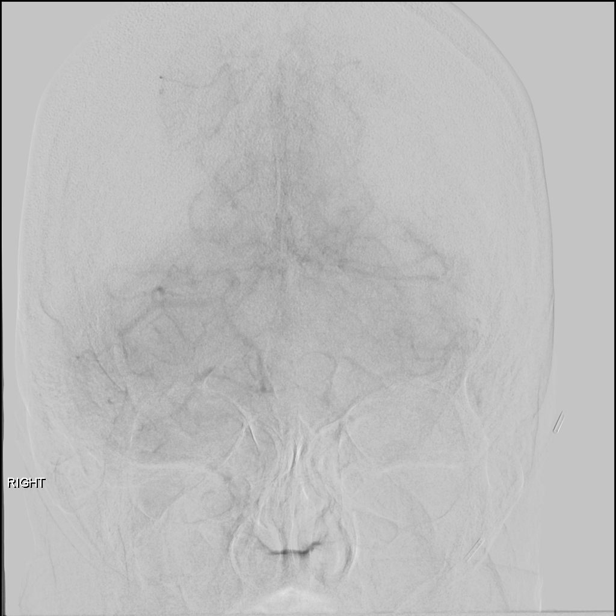
[im 160/176]
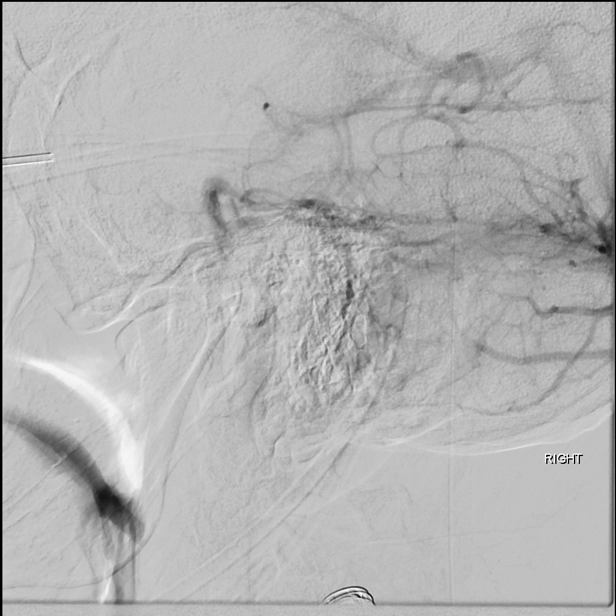
[im 176/176]
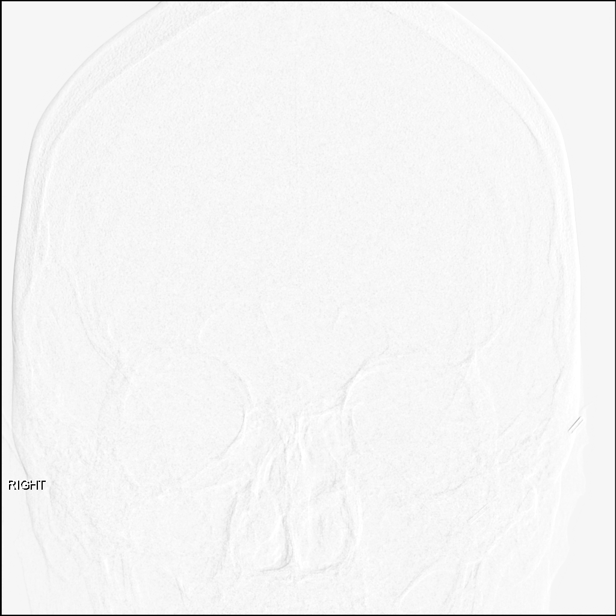

[12 of 24 positions shown; findings below may reference images not displayed]

MEDICATIONS:
Heparin [WK] units IV; no antibiotic was administered within 1 hour
of the procedure.

ANESTHESIA/SEDATION:
Versed 1 mg IV; Fentanyl 50 mcg IV.  Apresoline  5 mg IV.

Moderate Sedation Time:  37 minutes

The patient was continuously monitored during the procedure by the
interventional radiology nurse under my direct supervision.

CONTRAST:  Isovue 300 approximately 60 mL.

FLUOROSCOPY TIME:  Fluoroscopy Time: 15 minutes 18 seconds (745
mGy).

COMPLICATIONS:
None immediate.
The right groin was prepped and draped in the usual sterile fashion.
Thereafter using modified Seldinger technique, transfemoral access
into the right common femoral artery was obtained without
difficulty. Over a 0.035 inch guidewire, a 5 French Pinnacle sheath
was inserted. Through this, and also over 0.035 inch guidewire, a 5
French JB 1 catheter was advanced to the aortic arch region and
selectively positioned in the right common carotid artery, , the
right vertebral artery, the left common carotid artery and the left
subclavian artery.
FINDINGS: The right vertebral artery origin is widely patent.

The vessel is seen to meander with moderate tortuosity to the right
vertebrobasilar junction.

Wide patency is seen in the right vertebrobasilar junction and the
right posterior-inferior cerebellar artery.

The basilar artery, the posterior cerebral arteries, the superior
cerebellar arteries and the anterior- inferior cerebellar arteries
opacify into the capillary and venous phases.

There is approximately 50% stenosis of the left posterior cerebral
artery in the P1 P2 junction secondary to intracranial
arteriosclerosis. There is mild caliber irregularity of the mid
basilar artery also probably related to intracranial
arteriosclerosis.

The right common carotid arteriogram demonstrates mild stenosis of
the right external carotid artery. Its branches are normally
opacified.

The right internal carotid artery has a smooth shallow plaque along
the posterior wall without evidence of significant stenosis by the
NASCET criteria.

No evidence of ulcerations or of intraluminal filling defects noted
either.

The vessel opacifies to the cranial skull base there. The petrous,
cavernous and the supraclinoid segments are widely patent.

The right middle cerebral artery and the right anterior cerebral
artery opacify into the capillary and venous phases.

Mild caliber irregularity of the pericallosal A2 region suggests
mild intracranial arteriosclerosis.

At the right MCA bifurcation a lobulated prominent aneurysm is seen
measuring approximately 8.1 mm x 7.7 mm.

The left subclavian arteriogram demonstrates patency of the left
vertebral artery origin associated with mild tortuosity.

More distally the vessel opacifies to the cranial skull base. Wide
patency is seen of the left vertebrobasilar junction and the left
posterior inferior cerebellar artery. The opacified portions of the
basilar artery demonstrates wide patency with flow noted in the
posterior cerebral arteries, superior cerebellar arteries and the
anterior inferior cerebellar arteries. Mixing with unopacified blood
is seen in the basilar artery from the contralateral right vertebral
artery.

The left common carotid arteriogram demonstrates the origin of the
left external carotid artery and its major branches to be widely
patent.

The left internal carotid artery at the bulb demonstrates a minimal
shallow plaque without ulcerations.

The vessel is seen to opacify to the cranial skull base. The
petrous, cavernous and supraclinoid segments are widely patent.

The left middle cerebral artery and the left anterior cerebral
artery opacify into the capillary and venous phases.

There is mild caliber stenosis of the distal left anterior A1
segment. Also noted is mild caliber irregularity and narrowing of
the left pericallosal artery in the A2 region.
IMPRESSION: Approximately 8.1 mm x 7.7 mm bilobed right MCA bifurcation
aneurysm.

Approximately 50% stenosis of the left posterior cerebral artery in
the P1 P2 junction.

Evidence of mild intracranial arteriosclerosis involving the
pericallosal arteries bilaterally.

PLAN:
Findings discussed with patient's referring stroke neurologist, and
also the patient herself. Plan endovascular treatment of the right
middle cerebral artery aneurysm following patient's rehab.

Patient already on aspirin and Plavix.

## 2019-02-22 MED ORDER — HEPARIN SODIUM (PORCINE) 1000 UNIT/ML IJ SOLN
INTRAMUSCULAR | Status: AC
  Filled 2019-02-22: qty 1

## 2019-02-22 MED ORDER — FENTANYL CITRATE (PF) 100 MCG/2ML IJ SOLN
INTRAMUSCULAR | Status: AC
  Filled 2019-02-22: qty 2

## 2019-02-22 MED ORDER — MIDAZOLAM HCL 2 MG/2ML IJ SOLN
INTRAMUSCULAR | Status: AC
  Filled 2019-02-22: qty 2

## 2019-02-22 MED ORDER — LIDOCAINE HCL 1 % IJ SOLN
INTRAMUSCULAR | Status: AC | PRN
  Administered 2019-02-22: 10 mL

## 2019-02-22 MED ORDER — HYDRALAZINE HCL 20 MG/ML IJ SOLN
INTRAMUSCULAR | Status: AC | PRN
  Administered 2019-02-22: 5 mg via INTRAVENOUS

## 2019-02-22 MED ORDER — MIDAZOLAM HCL 2 MG/2ML IJ SOLN
INTRAMUSCULAR | Status: AC | PRN
  Administered 2019-02-22: 1 mg via INTRAVENOUS

## 2019-02-22 MED ORDER — IOHEXOL 300 MG/ML  SOLN
150.0000 mL | Freq: Once | INTRAMUSCULAR | Status: AC | PRN
  Administered 2019-02-22: 12:00:00 100 mL via INTRA_ARTERIAL

## 2019-02-22 MED ORDER — FENTANYL CITRATE (PF) 100 MCG/2ML IJ SOLN
INTRAMUSCULAR | Status: AC | PRN
  Administered 2019-02-22 (×2): 25 ug via INTRAVENOUS

## 2019-02-22 MED ORDER — HYDRALAZINE HCL 20 MG/ML IJ SOLN
INTRAMUSCULAR | Status: AC
  Filled 2019-02-22: qty 1

## 2019-02-22 MED ORDER — MORPHINE SULFATE (PF) 2 MG/ML IV SOLN
2.0000 mg | INTRAVENOUS | Status: AC | PRN
  Administered 2019-02-22 (×2): 2 mg via INTRAVENOUS
  Filled 2019-02-22 (×2): qty 1

## 2019-02-22 MED ORDER — SODIUM CHLORIDE 0.9 % IV SOLN
INTRAVENOUS | Status: AC
  Administered 2019-02-22: 13:00:00 via INTRAVENOUS

## 2019-02-22 MED ORDER — HEPARIN SODIUM (PORCINE) 1000 UNIT/ML IJ SOLN
INTRAMUSCULAR | Status: AC | PRN
  Administered 2019-02-22: 1000 [IU] via INTRAVENOUS

## 2019-02-22 MED ORDER — LIDOCAINE HCL 1 % IJ SOLN
INTRAMUSCULAR | Status: AC
  Filled 2019-02-22: qty 20

## 2019-02-22 NOTE — Progress Notes (Signed)
Physical Therapy Treatment Patient Details Name: Connie Oconnor MRN: [id number] DOB: 09-16-1941 Today's Date: 02/22/2019    History of Present Illness Connie Oconnor is a 78 y.o. female with past medical history of PVC, hypothyroidism, arthritis, migraines, h/o back surgery several years ago and has used walker/cane since, continued back pain.  Patient states that she went to bed at approximately 11:30 PM last night to which she was normal.  When patient awoke this morning she slid out of her bed secondary to left-sided weakness.  She continues to have left-sided weakness at this time.  Patient was brought to the hospital for further evaluation of possible stroke.  Patient states that she is allergic to aspirin and thus does not take it.  Her allergy is she feels shakiness.  She has never been on Plavix.  MRI shows R lacunar infarct.     PT Comments    Pt making steady progress with functional mobility. She tolerated short distance gait training with use of RW and min A x2 for safety and stability. Pt would continue to benefit from skilled physical therapy services at this time while admitted and after d/c to address the below listed limitations in order to improve overall safety and independence with functional mobility.    Follow Up Recommendations  CIR     Equipment Recommendations  None recommended by PT    Recommendations for Other Services Rehab consult     Precautions / Restrictions Precautions Precautions: Fall Restrictions Weight Bearing Restrictions: No    Mobility  Bed Mobility Overal bed mobility: Needs Assistance Bed Mobility: Supine to Sit;Sit to Supine     Supine to sit: Supervision Sit to supine: Min assist   General bed mobility comments: min A for return of LEs onto bed  Transfers Overall transfer level: Needs assistance Equipment used: Rolling walker (2 wheeled) Transfers: Sit to/from Stand Sit to Stand: Min guard;Min assist;+2 safety/equipment;+2  physical assistance         General transfer comment: pt initially able to transfer into standing from EOB with RW and cueing for safe hand placement; with fatigue pt becoming impulsive and requiring min A x2 for safety  Ambulation/Gait Ambulation/Gait assistance: +2 safety/equipment;+2 physical assistance;Min assist Gait Distance (Feet): 75 Feet Assistive device: Rolling walker (2 wheeled) Gait Pattern/deviations: Step-through pattern;Decreased step length - right;Decreased step length - left;Decreased stride length;Decreased weight shift to left;Trunk flexed Gait velocity: decreased   General Gait Details: pt with modest instability with ambulation, use of RW with frequent cueing to maintain L hand on grip of RW; physical assistance needed to achieve L hand on grip and able to maintain for brief periods of time with cueing. Min A x2 for stability and safety with ambulation; cueing needed to maintain proximity to [employer]    Modified Rankin (Stroke Patients Only) Modified Rankin (Stroke Patients Only) Pre-Morbid Rankin Score: Moderate disability Modified Rankin: Moderately severe disability     Balance Overall balance assessment: Needs assistance Sitting-balance support: Feet supported Sitting balance-Leahy Scale: Good     Standing balance support: Bilateral upper extremity supported Standing balance-Leahy Scale: Poor                              Cognition Arousal/Alertness: Awake/alert Behavior During Therapy: WFL for tasks assessed/performed Overall Cognitive Status: Impaired/Different from baseline Area of Impairment: Safety/judgement;Problem solving  Safety/Judgement: Decreased awareness of deficits;Decreased awareness of safety   Problem Solving: Difficulty sequencing;Requires verbal cues        Exercises      General Comments        Pertinent Vitals/Pain Pain  Assessment: No/denies pain    Home Living                      Prior Function            PT Goals (current goals can now be found in the care plan section) Acute Rehab PT Goals PT Goal Formulation: With patient Time For Goal Achievement: 03/07/19 Potential to Achieve Goals: Good Progress towards PT goals: Progressing toward goals    Frequency    Min 4X/week      PT Plan Current plan remains appropriate    Co-evaluation PT/OT/SLP Co-Evaluation/Treatment: Yes Reason for Co-Treatment: To address functional/ADL transfers;For patient/therapist safety PT goals addressed during session: Mobility/safety with mobility;Balance;Proper use of DME;Strengthening/ROM        AM-PAC PT "6 Clicks" Mobility   Outcome Measure  Help needed turning from your back to your side while in a flat bed without using bedrails?: A Little Help needed moving from lying on your back to sitting on the side of a flat bed without using bedrails?: A Little Help needed moving to and from a bed to a chair (including a wheelchair)?: A Little Help needed standing up from a chair using your arms (e.g., wheelchair or bedside chair)?: A Little Help needed to walk in hospital room?: A Lot Help needed climbing 3-5 steps with a railing? : A Lot 6 Click Score: 16    End of Session Equipment Utilized During Treatment: Gait belt Activity Tolerance: Patient tolerated treatment well Patient left: in bed;with call bell/phone within reach Nurse Communication: Mobility status PT Visit Diagnosis: Other abnormalities of gait and mobility (R26.89);Muscle weakness (generalized) (M62.81)     Time: [id number] PT Time Calculation (min) (ACUTE ONLY): 27 min  Charges:  $Gait Training: 8-22 mins                     Deborah ChalkJennifer Lynnae Ludemann, [place]PT, DPT  Acute Rehabilitation Services Pager [phone] Office [phone]     Alessandra BevelsJennifer M Elysia Grand 02/22/2019, 2:57 PM

## 2019-02-22 NOTE — PMR Pre-admission (Signed)
PMR Admission Coordinator Pre-Admission Assessment  Patient: Connie Oconnor is an 78 y.o., female MRN: [id number] DOB: 08-05-1941 Height: _0  (165.1 cm) Weight: 70.3 kg  Insurance Information HMO: yes    PPO:      PCP:      IPA:      80/20:      OTHER: medicare advantage plan PRIMARY: [employer] medicare      Policy#: [id number]      Subscriber: pt CM Name: Micheline Chapman      Phone#: [phone] ext [phone]     Fax#: [phone] Pre-Cert#: [id number]  Approved for 3 days with f/u Dorthula Nettles    Employer:  Benefits:  Phone #: [phone]     Name: 02/22/2019 Eff. Date: 11/01/2018     Deduct: none      Out of Pocket Max: $3600      Life Max: none CIR: $295 co pay per day days 1 until 5      SNF: no co pay days 1 until 20; $160 co pay per day days 21 until 43; no co pay days 44 until 100 Outpatient: $30 co pay per visit     Co-Pay: visits per medical neccesity Home Health: 100%      Co-Pay: visits per medical neccesity DME: 80%     Co-Pay: 205 Providers: in network  SECONDARY: [employer]      Policy#: [id number]      Subscriber: pt Supplement plan Gap policy  Medicaid Application Date:       Case Manager:  Disability Application Date:       Case Worker:   The "Data Collection Information Summary" for patients in Inpatient Rehabilitation Facilities with attached "Privacy Act [place] Records" was provided and verbally reviewed with: Patient and Family  Emergency Contact Information Contact Information    Name Relation Home Work Mobile   Light,Cindy Daughter [phone]   Rafael Bihari Daughter   [phone]      Current Medical History  Patient Admitting Diagnosis: right corono radiata CVA; R MCA aneurysm  History of Present Illness: Connie Oconnor is a 78 year old right-handed female with past medical history of right SAH from a fall May 2018, hypothyroidism, tobacco abuse, back surgery. Presented on 02/20/2019 with acute  left-sided weakness. Patient denied any fall or trauma. MRI the brain showed small acute infarct in the right corona radiata. MRA head was negative for large vessel occlusion.   MRA of the neck showed a 7 mm aneurysm right MCA bifurcation. Patient did not receive TPA. Echocardiogram with ejection fraction of 97% normal systolic function.no source of embolism. 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 Plan is for outpatient coiling of cerebral aneurysm per interventional radiology. Patient is tolerating a regular diet.   Complete NIHSS TOTAL: 8  Patient's medical record from [facility]  has been reviewed by the rehabilitation admission coordinator and physician.  Past Medical History  Past Medical History:  Diagnosis Date  . Anemia    hx of  . Arthritis   . Dislocation of right knee with medial meniscus tear   . Headache    hx of migraines none since 1990's  . Hypothyroidism    1978, no problems since  . Pneumonia 1990's none since  . PVC (premature ventricular contraction)    199'0's none since  . SAH (subarachnoid hemorrhage) ([place])  2016   On the right    Family History   family history includes Heart attack in her father; Stroke in her mother.  Prior Rehab/Hospitalizations Has the patient had prior rehab or hospitalizations prior to admission? Yes  Has the patient had major surgery during 100 days prior to admission? No   Current Medications  Current Facility-Administered Medications:  .  acetaminophen (TYLENOL) tablet 650 mg, 650 mg, Oral, Q4H PRN **OR** acetaminophen (TYLENOL) solution 650 mg, 650 mg, Per Tube, Q4H PRN **OR** acetaminophen (TYLENOL) suppository 650 mg, 650 mg, Rectal, Q4H PRN, Karmen Bongo, MD .  amLODipine (NORVASC) tablet 10 mg, 10 mg, Oral, Daily, Rosalin Hawking, MD, 10 mg at 02/23/19 1035 .  aspirin EC  tablet 81 mg, 81 mg, Oral, Daily, Rosalin Hawking, MD, 81 mg at 02/23/19 1035 .  atorvastatin (LIPITOR) tablet 40 mg, 40 mg, Oral, q1800, Karmen Bongo, MD, 40 mg at 02/22/19 1816 .  clopidogrel (PLAVIX) tablet 75 mg, 75 mg, Oral, Daily, Rosalin Hawking, MD, 75 mg at 02/23/19 1035 .  cyclobenzaprine (FLEXERIL) tablet 10 mg, 10 mg, Oral, TID PRN, Karmen Bongo, MD, 10 mg at 02/21/19 1327 .  diphenhydrAMINE (BENADRYL) capsule 25 mg, 25 mg, Oral, QHS PRN, Karmen Bongo, MD .  feeding supplement (ENSURE ENLIVE) (ENSURE ENLIVE) liquid 237 mL, 237 mL, Oral, BID BM, Little Ishikawa, MD, 237 mL at 02/23/19 1035 .  senna-docusate (Senokot-S) tablet 1 tablet, 1 tablet, Oral, QHS PRN, Karmen Bongo, MD .  traMADol Veatrice Bourbon) tablet 50 mg, 50 mg, Oral, Q8H PRN, Karmen Bongo, MD, 50 mg at 02/22/19 1828  Patients Current Diet:  Diet Order            Diet Heart Room service appropriate? Yes; Fluid consistency: Thin  Diet effective now              Precautions / Restrictions Precautions Precautions: Fall Restrictions Weight Bearing Restrictions: No   Has the patient had 2 or more falls or a fall with injury in the past year? No  Prior Activity Level Limited Community (1-2x/wk): Independent; drove; used cane or walker when needed; sedentary  Prior Functional Level Self Care: Did the patient need help bathing, dressing, using the toilet or eating? Independent  Indoor Mobility: Did the patient need assistance with walking from room to room (with or without device)? Independent  Stairs: Did the patient need assistance with internal or external stairs (with or without device)? Independent  Functional Cognition: Did the patient need help planning regular tasks such as shopping or remembering to take medications? Independent  Home Assistive Devices / Equipment Home Assistive Devices/Equipment: Environmental consultant (specify type) Home Equipment: Shower seat, Environmental consultant - 2 wheels, Cane - single point,  Wheelchair - manual  Prior Device Use: Indicate devices/aids used by the patient prior to current illness, exacerbation or injury? Walker  Current Functional Level Cognition  Arousal/Alertness: Awake/alert Overall Cognitive Status: Impaired/Different from baseline Orientation Level: Oriented X4 Safety/Judgement: Decreased awareness of deficits, Decreased awareness of safety Attention: Focused, Sustained Focused Attention: Appears intact(Vigilance WNL: 1/1) Sustained Attention: Appears intact(Serial 7s: 3/3) Memory: Impaired Memory Impairment: Decreased recall of new information, Retrieval deficit(Immediate words: 4/5; Delayed: 0/5; with cues: 5/5) Awareness: Appears intact Problem Solving: Appears intact(4/4) Executive Function: Reasoning Reasoning: Impaired Reasoning Impairment: Verbal complex(Abstraction; 0/2)    Extremity Assessment (includes Sensation/Coordination)  Upper Extremity Assessment: Defer to OT evaluation LUE Deficits / Details: Pt with [place] level 3 in arm and 3-4 in left hand. Pt states she only had shoulder flexion  in L arm to about 90* before and used her Right arm to assist her L arm for things like combing her hair and reaching up.  Pt with full elbow flexion/extenstion, supination/pronation, wrist flexion and extension and grasp and release however grasp and release is weak.  All movements in LUE are slowed and strength 2+/5.  Pt states "it doesn't work" however pt is able to hold light objects, touch her nose with LUE with encouragement and cues. Sensation for light touch appears intact LUE Sensation: WNL(for light touch) LUE Coordination: decreased fine motor, decreased gross motor  Lower Extremity Assessment: LLE deficits/detail LLE Deficits / Details: pt with mild strength deficits compared to R LE; MMT revealed 3/5 for hip flexion, 5/5 for knee extension, 4/5 for knee flexion and 4/5 for ankle DF    ADLs  Overall ADL's : Needs  assistance/impaired Eating/Feeding: Set up, Minimal assistance(for cutting. Using non dominant hand to self feed) Grooming: Oral care, Standing Grooming Details (indicate cue type and reason): Pt needed mod a to clean dentures due to poor LUE functional use. Pt also needed min a for standing at sink. Upper Body Bathing: Moderate assistance, Sitting Upper Body Bathing Details (indicate cue type and reason): close supervision for sitting balance and assist due to decreased LUE function Lower Body Bathing: Sit to/from stand, Moderate assistance Upper Body Dressing : Moderate assistance Upper Body Dressing Details (indicate cue type and reason): due to sitting balance (pt with left bias with dynamic sitting) and decreased LUE function Lower Body Dressing: Maximal assistance Toilet Transfer: Minimal assistance, Stand-pivot Toilet Transfer Details (indicate cue type and reason): moderate assistance for ambulatory transfer using cane Toileting- Clothing Manipulation and Hygiene: Moderate assistance Functional mobility during ADLs: +2 for physical assistance(min a x2 with RW for assist with LUE for walker as well as balance) General ADL Comments: Pt initially required min guard x 1 for sit to stand.  As pt continued to work in therapy, required min a x2 for safe functional mobility    Mobility  Overal bed mobility: Needs Assistance Bed Mobility: Supine to Sit, Sit to Supine Rolling: Supervision Sidelying to sit: Supervision Supine to sit: Supervision Sit to supine: Min assist General bed mobility comments: min A for return of LEs onto bed    Transfers  Overall transfer level: Needs assistance Equipment used: Rolling walker (2 wheeled) Transfers: Sit to/from Stand Sit to Stand: Min guard, Min assist, +2 safety/equipment, +2 physical assistance Stand pivot transfers: +2 physical assistance(min a x2 for functional for ambulatory transfers with RW) General transfer comment: pt initially able to  transfer into standing from EOB with RW and cueing for safe hand placement; with fatigue pt becoming impulsive and requiring min A x2 for safety    Ambulation / Gait / Stairs / Wheelchair Mobility  Ambulation/Gait Ambulation/Gait assistance: +2 safety/equipment, +2 physical assistance, Min assist Gait Distance (Feet): 75 Feet Assistive device: Rolling walker (2 wheeled) Gait Pattern/deviations: Step-through pattern, Decreased step length - right, Decreased step length - left, Decreased stride length, Decreased weight shift to left, Trunk flexed General Gait Details: pt with modest instability with ambulation, use of RW with frequent cueing to maintain L hand on grip of RW; physical assistance needed to achieve L hand on grip and able to maintain for brief periods of time with cueing. Min A x2 for stability and safety with ambulation; cueing needed to maintain proximity to RW Gait velocity: decreased    Posture / Balance Dynamic Sitting Balance Sitting balance - Comments:  supervision due to impulsivity Balance Overall balance assessment: Needs assistance Sitting-balance support: Feet supported Sitting balance-Leahy Scale: Good Sitting balance - Comments: supervision due to impulsivity Postural control: Left lateral lean Standing balance support: Bilateral upper extremity supported Standing balance-Leahy Scale: Poor    Special needs/care consideration BiPAP/CPAP  N/a CPM  N/a Continuous Drip IV  N/a Dialysis n/a Life Vest  N/a Oxygen  N/a Special Bed  N/a Trach Size  N/a Wound Vac n/a Skin ecchymosis to BUE Bowel mgmt:  Continent LBM  4/21 Bladder mgmt: continent Diabetic mgmt: Hgb A1c 5.5 Behavioral consideration  N/a Chemo/radiation  N/a   Previous Home Environment  Living Arrangements: Alone  Lives With: Alone Available Help at Discharge: (daughters [place] and [place] to provide 24/7 supervision initia) Type of Home: House Home Layout: One level Home Access: Ramped  entrance Bathroom Shower/Tub: Tub/shower unit, Architectural technologist: Handicapped height Bathroom Accessibility: Yes How Accessible: Accessible via walker Home Care Services: No  Discharge Living Setting Plans for Discharge Living Setting: Patient's home, Alone Type of Home at Discharge: House Discharge Home Layout: One level Discharge Home Access: [place] entrance Discharge Bathroom Shower/Tub: Tub/shower unit, Curtain Discharge Bathroom Toilet: Handicapped height Discharge Bathroom Accessibility: Yes How Accessible: Accessible via walker Does the patient have any problems obtaining your medications?: No  Social/Family/Support Systems Patient Roles: Parent Contact Information: Jenny Reichmann, daughter main contact Anticipated Caregiver: Jenny Reichmann and Juliann Pulse, daughters Anticipated Caregiver's Contact Information: see above Ability/Limitations of Caregiver: Juliann Pulse lives one hr away but can work remotely at [employer] home; Jenny Reichmann can assist Caregiver Availability: 24/7 Discharge Plan Discussed with Primary Caregiver: Yes Is Caregiver In Agreement with Plan?: Yes Does Caregiver/Family have Issues with Lodging/Transportation while Pt is in Rehab?: No  Vaughan Basta states she lives 10 miles from pt. Her sister Juliann Pulse lives an hour away but can work remotely from her [place] home. Linda cant work remotely but can assist with providing 24/7 supervision at d/c  Goals/Additional Needs Patient/Family Goal for Rehab: supervision PT, OT, and SLP Expected length of stay: ELOS 10 to 14 days Pt/Family Agrees to Admission and willing to participate: Yes Program Orientation Provided & Reviewed with Pt/Caregiver Including Roles  & Responsibilities: Yes  Decrease burden of Care through IP rehab admission: n/a  Possible need for SNF placement upon discharge:  Not anticipated  Patient Condition: I have reviewed medical records from [facility] , spoken with CM, and patient and daughter. I met with patient at the  bedside for inpatient rehabilitation assessment.  Patient will benefit from ongoing PT, OT and SLP, can actively participate in 3 hours of therapy a day 5 days of the week, and can make measurable gains during the admission.  Patient will also benefit from the coordinated team approach during an Inpatient Acute Rehabilitation admission.  The patient will receive intensive therapy as well as Rehabilitation physician, nursing, social worker, and care management interventions.  Due to bladder management, bowel management, safety, skin/wound care, disease management, medication administration, pain management and patient education the patient requires 24 hour a day rehabilitation nursing.  The patient is currently min to mod assist with mobility and basic ADLs.  Discharge setting and therapy post discharge at home with home health is anticipated.  Patient has agreed to participate in the Acute Inpatient Rehabilitation Program and will admit today.  Preadmission Screen Completed By:  Cleatrice Burke RN MSN 02/23/2019 10:59 AM ______________________________________________________________________   Discussed status with Dr. Naaman Plummer on  02/23/2019 at  1058 and received approval for admission  today.  Admission Coordinator:  Cleatrice Burke, RN MSN time  1058 Date  02/23/2019   Assessment/Plan: Diagnosis:Right CR infarct, Right MCA aneurysm 1. Does the need for close, 24 hr/day Medical supervision in concert with the patient's rehab needs make it unreasonable for this patient to be served in a less intensive setting? Yes 2. Co-Morbidities requiring supervision/potential complications: HTN, active aneurysm 3. Due to bladder management, bowel management, safety, skin/wound care, disease management, medication administration, pain management and patient education, does the patient require 24 hr/day rehab nursing? Yes 4. Does the patient require coordinated care of a physician, rehab nurse, PT (1-2  hrs/day, 5 days/week), OT (1-2 hrs/day, 5 days/week) and SLP (1-2 hrs/day, 5 days/week) to address physical and functional deficits in the context of the above medical diagnosis(es)? Yes Addressing deficits in the following areas: balance, endurance, locomotion, strength, transferring, bowel/bladder control, bathing, dressing, feeding, grooming, toileting, cognition, speech and psychosocial support 5. Can the patient actively participate in an intensive therapy program of at least 3 hrs of therapy 5 days a week? Yes 6. The potential for patient to make measurable gains while on inpatient rehab is excellent 7. Anticipated functional outcomes upon discharge from inpatients are: supervision PT, supervision OT, supervision SLP 8. Estimated rehab length of stay to reach the above functional goals is: 10-14 days 9. Anticipated D/C setting: Home 10. Anticipated post D/C treatments: [place] therapy 11. Overall Rehab/Functional Prognosis: excellent  MD Signature: Meredith Staggers, MD, [facility] 02/23/2019

## 2019-02-22 NOTE — H&P (Signed)
Physical Medicine and Rehabilitation Admission H&P    Chief Complaint  Patient presents with  . Cerebrovascular Accident  chief complaint: left side weakness  HPI: Connie Oconnor is a 78 year old right-handed female with past medical history of right SAH from a fall May 2018, hypothyroidism, tobacco abuse, back surgery.per chart review patient lives alone. One level home with ramped entrance. Ambulated with a rolling walker since her back surgery. She does have local family check on her routinely. Presented on 02/20/2019 with acute left-sided weakness. Patient denied any fall or trauma. MRI the brain showed small acute infarct in the right corona radiata. MRA head was negative for large vessel occlusion.   MRA of the neck showed a 7 mm aneurysm right MCA bifurcation. Patient did not receive TPA. Echocardiogram with ejection fraction of 65% normal systolic function.no source of embolism. 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 Plan is for outpatient coiling of cerebral aneurysm per interventional radiology. Patient is tolerating a regular diet. Therapy evaluations completed and patient was admitted for a comprehensive rehabilitation program.  Review of Systems  Constitutional: Negative for chills and fever.  HENT: Negative for hearing loss.   Eyes: Negative for blurred vision and double vision.  Respiratory: Negative for cough and shortness of breath.   Cardiovascular: Negative for chest pain, palpitations and leg swelling.  Gastrointestinal: Positive for constipation. Negative for heartburn, nausea and vomiting.  Genitourinary: Negative for dysuria, flank pain and hematuria.  Musculoskeletal: Positive for back pain, joint pain and myalgias.  Skin: Negative for rash.  Neurological: Positive for focal weakness and headaches.   Psychiatric/Behavioral: The patient has insomnia.   All other systems reviewed and are negative.  Past Medical History:  Diagnosis Date  . Anemia    hx of  . Arthritis   . Dislocation of right knee with medial meniscus tear   . Headache    hx of migraines none since 1990's  . Hypothyroidism    1978, no problems since  . Pneumonia 1990's none since  . PVC (premature ventricular contraction)    199'0's none since  . SAH (subarachnoid hemorrhage) (HCC) 2016   On the right   Past Surgical History:  Procedure Laterality Date  . BACK SURGERY    . CERVICAL LAMINECTOMY  1986  . DILATION AND CURETTAGE OF UTERUS  1970  . ELBOW SURGERY Right 1993  . KNEE ARTHROSCOPY WITH SUBCHONDROPLASTY Right 01/11/2018   Procedure: KNEE ARTHROSCOPY WITH SUBCHONDROPLASTY, PARTIAL MEDIAL MENISCECTOMY, CHONDROPLASTY OF THE MEDIAL PATELLA FEMORAL CHONDYL, MEDIAL PLICA  EXCISION;  Surgeon: Jodi GeraldsGraves, John, MD;  Location: [facility];  Service: Orthopedics;  Laterality: Right;  . LUMBAR LAMINECTOMY  1993   rods placed bone generated removed one year later   . MULTIPLE TOOTH EXTRACTIONS    . SHOULDER SURGERY Left    arthritis clean out and shortened collar bone dr Renae Ficklepaul  . TONSILLECTOMY  1970  . VAGINAL HYSTERECTOMY  1971   partial dr Dewaine Congerbarker  . WISDOM TOOTH EXTRACTION     Family History  Problem Relation Age of Onset  . Stroke Mother   . Heart attack Father    Social History:  reports that she has been smoking cigarettes. She has a 72.00 pack-year smoking history. She has never used smokeless tobacco. She reports that she does not drink alcohol or use drugs. Allergies:  Allergies  Allergen Reactions  .  Aspirin Other (See Comments)    shaking  . Ibuprofen Other (See Comments)    Shaking  . Penicillins Rash and Other (See Comments)    Has patient had a PCN reaction causing immediate rash, facial/tongue/throat swelling, SOB or lightheadedness with hypotension: No Has patient had a PCN reaction causing severe  rash involving mucus membranes or skin necrosis: No Has patient had a PCN reaction that required hospitalization: Yes - was in hospital Has patient had a PCN reaction occurring within the last 10 years: No If all of the above answers are "NO", then may proceed with Cephalosporin use.    Medications Prior to Admission  Medication Sig Dispense Refill  . acetaminophen (TYLENOL) 500 MG tablet Take 1,000 mg by mouth every 6 (six) hours as needed for mild pain or headache.     . cyclobenzaprine (FLEXERIL) 10 MG tablet Take 10 mg by mouth 3 (three) times daily as needed for muscle spasms.    . diphenhydrAMINE (BENADRYL) 25 MG tablet Take 25 mg by mouth at bedtime as needed for allergies.     Marland Kitchen traMADol (ULTRAM) 50 MG tablet Take 50 mg by mouth every 8 (eight) hours as needed for moderate pain.    Marland Kitchen oxyCODONE-acetaminophen (PERCOCET/ROXICET) 5-325 MG tablet Take 1-2 tablets by mouth every 6 (six) hours as needed for severe pain. (Patient not taking: Reported on 02/20/2019) 40 tablet 0    Drug Regimen Review Drug regimen was reviewed and remains appropriate with no significant issues identified  Home: Home Living Family/patient expects to be discharged to:: Private residence Living Arrangements: Alone Available Help at Discharge: (daughters [place] and [place] to provide 24/7 supervision initia) Type of Home: House Home Access: Ramped entrance Home Layout: One level Bathroom Shower/Tub: Tub/shower unit, Engineer, building services: Handicapped height Bathroom Accessibility: Yes Home Equipment: Information systems manager, Environmental consultant - 2 wheels, Cane - single point, Wheelchair - manual  Lives With: Alone   Functional History: Prior Function Level of Independence: Independent with assistive device(s) Comments: ambulating with a RW  Functional Status:  Mobility: Bed Mobility Overal bed mobility: Needs Assistance Bed Mobility: Supine to Sit, Sit to Supine Rolling: Supervision Sidelying to sit: Supervision Supine  to sit: Supervision Sit to supine: Min assist General bed mobility comments: min A for return of LEs onto bed Transfers Overall transfer level: Needs assistance Equipment used: Rolling walker (2 wheeled) Transfers: Sit to/from Stand Sit to Stand: Min guard, Min assist, +2 safety/equipment, +2 physical assistance Stand pivot transfers: +2 physical assistance(min a x2 for functional for ambulatory transfers with RW) General transfer comment: pt initially able to transfer into standing from EOB with RW and cueing for safe hand placement; with fatigue pt becoming impulsive and requiring min A x2 for safety Ambulation/Gait Ambulation/Gait assistance: +2 safety/equipment, +2 physical assistance, Min assist Gait Distance (Feet): 75 Feet Assistive device: Rolling walker (2 wheeled) Gait Pattern/deviations: Step-through pattern, Decreased step length - right, Decreased step length - left, Decreased stride length, Decreased weight shift to left, Trunk flexed General Gait Details: pt with modest instability with ambulation, use of RW with frequent cueing to maintain L hand on grip of RW; physical assistance needed to achieve L hand on grip and able to maintain for brief periods of time with cueing. Min A x2 for stability and safety with ambulation; cueing needed to maintain proximity to RW Gait velocity: decreased    ADL: ADL Overall ADL's : Needs assistance/impaired Eating/Feeding: Set up, Minimal assistance(for cutting. Using non dominant hand to self feed) Grooming:  Oral care, Standing Grooming Details (indicate cue type and reason): Pt needed mod a to clean dentures due to poor LUE functional use. Pt also needed min a for standing at sink. Upper Body Bathing: Moderate assistance, Sitting Upper Body Bathing Details (indicate cue type and reason): close supervision for sitting balance and assist due to decreased LUE function Lower Body Bathing: Sit to/from stand, Moderate assistance Upper Body  Dressing : Moderate assistance Upper Body Dressing Details (indicate cue type and reason): due to sitting balance (pt with left bias with dynamic sitting) and decreased LUE function Lower Body Dressing: Maximal assistance Toilet Transfer: Minimal assistance, Stand-pivot Toilet Transfer Details (indicate cue type and reason): moderate assistance for ambulatory transfer using cane Toileting- Clothing Manipulation and Hygiene: Moderate assistance Functional mobility during ADLs: +2 for physical assistance(min a x2 with RW for assist with LUE for walker as well as balance) General ADL Comments: Pt initially required min guard x 1 for sit to stand.  As pt continued to work in therapy, required min a x2 for safe functional mobility  Cognition: Cognition Overall Cognitive Status: Impaired/Different from baseline Arousal/Alertness: Awake/alert Orientation Level: (P) Oriented X4 Attention: Focused, Sustained Focused Attention: Appears intact(Vigilance WNL: 1/1) Sustained Attention: Appears intact(Serial 7s: 3/3) Memory: Impaired Memory Impairment: Decreased recall of new information, Retrieval deficit(Immediate words: 4/5; Delayed: 0/5; with cues: 5/5) Awareness: Appears intact Problem Solving: Appears intact(4/4) Executive Function: Reasoning Reasoning: Impaired Reasoning Impairment: Verbal complex(Abstraction; 0/2) Cognition Arousal/Alertness: Awake/alert Behavior During Therapy: WFL for tasks assessed/performed Overall Cognitive Status: Impaired/Different from baseline Area of Impairment: Safety/judgement, Problem solving Safety/Judgement: Decreased awareness of deficits, Decreased awareness of safety Problem Solving: Difficulty sequencing, Requires verbal cues  Physical Exam: Blood pressure 133/64, pulse 76, temperature 98.5 F (36.9 C), temperature source Oral, resp. rate 16, height  (1.651 m), weight 70.3 kg, SpO2 94 %. Physical Exam  Constitutional: She appears well-developed.  No distress.  HENT:  Head: Normocephalic and atraumatic.  Eyes: Pupils are equal, round, and reactive to light. EOM are normal.  Neck: Normal range of motion. No tracheal deviation present. No thyromegaly present.  Cardiovascular: Normal rate. Exam reveals no friction rub.  No murmur heard. Respiratory: Effort normal. No respiratory distress. She has no wheezes.  GI: Soft. She exhibits no distension. There is no abdominal tenderness.  Musculoskeletal: Normal range of motion.     Comments: LB tender to palpation. Had difficulty performing SLR due to back pain, cramping Right hand, tender with ROM  Neurological: She is alert.  Patient sitting up in chair. Makes good eye contact with examiner. Left central 7.. Follows commands. Provides her name and age. Fair medical historian. Speech dysarthric. RUE 4+ to 5/5 prox to distal. LUE 2/5 deltoid, biceps, triceps, wrist, and hand. RLE and LLE grossly 2-/5  (pain) and 4/5 ADF/PF. No focal sensory findings. DTR's 1+.  Skin: Skin is warm. No rash noted. No erythema.  Psychiatric: She has a normal mood and affect. Her behavior is normal.    Results for orders placed or performed during the hospital encounter of 02/20/19 (from the past 48 hour(s))  CBC with Differential/Platelet     Status: None   Collection Time: 02/22/19  3:56 AM  Result Value Ref Range   WBC 5.5 4.0 - 10.5 K/uL   RBC 4.97 3.87 - 5.11 MIL/uL   Hemoglobin 14.0 12.0 - 15.0 g/dL   HCT 16.1 09.6 - 04.5 %   MCV 85.7 80.0 - 100.0 fL   MCH 28.2 26.0 - 34.0 pg  MCHC 32.9 30.0 - 36.0 g/dL   RDW 12.4 58.0 - 99.8 %   Platelets 279 150 - 400 K/uL   nRBC 0.0 0.0 - 0.2 %   Neutrophils Relative % 59 %   Neutro Abs 3.2 1.7 - 7.7 K/uL   Lymphocytes Relative 27 %   Lymphs Abs 1.5 0.7 - 4.0 K/uL   Monocytes Relative 10 %   Monocytes Absolute 0.6 0.1 - 1.0 K/uL   Eosinophils Relative 3 %   Eosinophils Absolute 0.2 0.0 - 0.5 K/uL   Basophils Relative 1 %   Basophils Absolute 0.0 0.0 - 0.1  K/uL   Immature Granulocytes 0 %   Abs Immature Granulocytes 0.01 0.00 - 0.07 K/uL    Comment: Performed at [facility], [address], [place], [place] [address]  Basic metabolic panel     Status: Abnormal   Collection Time: 02/22/19  3:56 AM  Result Value Ref Range   Sodium 141 135 - 145 mmol/L   Potassium 3.6 3.5 - 5.1 mmol/L   Chloride 106 98 - 111 mmol/L   CO2 24 22 - 32 mmol/L   Glucose, Bld 105 (H) 70 - 99 mg/dL   BUN 10 8 - 23 mg/dL   Creatinine, Ser 0.53 0.44 - 1.00 mg/dL   Calcium 9.1 8.9 - 97.6 mg/dL   GFR calc non Af Amer >60 >60 mL/min   GFR calc Af Amer >60 >60 mL/min   Anion gap 11 5 - 15    Comment: Performed at [facility], [address], [place], [place] [address]  CBC with Differential/Platelet     Status: None   Collection Time: 02/23/19  3:04 AM  Result Value Ref Range   WBC 8.9 4.0 - 10.5 K/uL   RBC 4.58 3.87 - 5.11 MIL/uL   Hemoglobin 13.3 12.0 - 15.0 g/dL   HCT 37.9 02.4 - 09.7 %   MCV 84.9 80.0 - 100.0 fL   MCH 29.0 26.0 - 34.0 pg   MCHC 34.2 30.0 - 36.0 g/dL   RDW 35.3 29.9 - 24.2 %   Platelets 276 150 - 400 K/uL   nRBC 0.0 0.0 - 0.2 %   Neutrophils Relative % 77 %   Neutro Abs 6.9 1.7 - 7.7 K/uL   Lymphocytes Relative 12 %   Lymphs Abs 1.1 0.7 - 4.0 K/uL   Monocytes Relative 9 %   Monocytes Absolute 0.8 0.1 - 1.0 K/uL   Eosinophils Relative 1 %   Eosinophils Absolute 0.0 0.0 - 0.5 K/uL   Basophils Relative 0 %   Basophils Absolute 0.0 0.0 - 0.1 K/uL   Immature Granulocytes 1 %   Abs Immature Granulocytes 0.04 0.00 - 0.07 K/uL    Comment: Performed at [facility], [address] [place], [place] [address]  Basic metabolic panel     Status: Abnormal   Collection Time: 02/23/19  3:04 AM  Result Value Ref Range   Sodium 138 135 - 145 mmol/L   Potassium 3.9 3.5 - 5.1 mmol/L   Chloride 106 98 - 111 mmol/L   CO2 22 22 - 32 mmol/L   Glucose, Bld 113 (H) 70 - 99 mg/dL   BUN 11 8 - 23 mg/dL   Creatinine, Ser 9.62 0.44 -  1.00 mg/dL   Calcium 9.4 8.9 - 22.9 mg/dL   GFR calc non Af Amer >60 >60 mL/min   GFR calc Af Amer >60 >60 mL/min   Anion gap  10 5 - 15    Comment: Performed at [facility], [address] [place], [place] [address]   No results found.     Medical Problem List and Plan: 1.  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    -admit to inpatient rehab  -Plan outpatient coiling of aneurysm per interventional radiology 2.  Antithrombotics: -DVT/anticoagulation:  SCDs.  -antiplatelet therapy: aspirin 81 mg daily Plavix 75 mg daily 3 weeks and Plavix alone 3. Pain Management/migraine headaches:  Tramadol as needed  -will schedule flexeril (home med) for right hand cramps/back  -add kpad for low back 4. Mood:  Provide emotional support  -antipsychotic agents: N/A 5. Neuropsych: This patient is capable of making decisions on her own behalf. 6. Skin/Wound Care:  Routine skin checks 7. Fluids/Electrolytes/Nutrition:  Routine in and outs with follow-up chemistries 8.  History of traumatic [facility] May 2018. Patient seen in the past by Dr. Conchita Paris 9. Hypertension. Norvasc 10 mg daily 10. Hyperlipidemia. Lipitor 11. Tobacco abuse. Counseling     Charlton Amor, PA-C 02/23/2019

## 2019-02-22 NOTE — Progress Notes (Signed)
Occupational Therapy Treatment Patient Details Name: Connie Oconnor MRN: [id number] DOB: October 23, 1941 Today's Date: 02/22/2019    History of present illness Connie Oconnor is a 78 y.o. female with past medical history of PVC, hypothyroidism, arthritis, migraines, h/o back surgery several years ago and has used walker/cane since, continued back pain.  Patient states that she went to bed at approximately 11:30 PM last night to which she was normal.  When patient awoke this morning she slid out of her bed secondary to left-sided weakness.  She continues to have left-sided weakness at this time.  Patient was brought to the hospital for further evaluation of possible stroke.  Patient states that she is allergic to aspirin and thus does not take it.  Her allergy is she feels shakiness.  She has never been on Plavix.  MRI shows R lacunar infarct.    OT comments  Pt seen for cotreatment session with OT and PT.  Pt with improved functional mobility today. Initially pt required min guard for sit to stand, however as session progressed and pt fatigued, pt required min a x 2 for functional mobility and balance. Pt becomes more impulsive with fatigue as well. Pt with L neglect as well as L hemiplegia impacting functional use of LUE and required mod cues to attend to L hand for use with RW.     Follow Up Recommendations  CIR    Equipment Recommendations  Other (comment)    Recommendations for Other Services Rehab consult    Precautions / Restrictions Precautions Precautions: Fall Restrictions Weight Bearing Restrictions: No       Mobility Bed Mobility Overal bed mobility: Needs Assistance Bed Mobility: Rolling;Sidelying to Sit;Supine to Sit Rolling: Supervision Sidelying to sit: Supervision Supine to sit: Supervision Sit to supine: Min assist(with LE's)      Transfers Overall transfer level: Needs assistance Equipment used: Rolling walker (2 wheeled) Transfers: Sit to/from Stand Sit to  Stand: Min guard(initially required min guard of 1 but with fatigue pt required min a x2 for functional mobility) Stand pivot transfers: +2 physical assistance(min a x2 for functional for ambulatory transfers with RW)       General transfer comment: Pt with L neglect and requires mod cues to attend to LUE using RW and min cues for LLE.      Balance Overall balance assessment: Needs assistance Sitting-balance support: Feet supported Sitting balance-Leahy Scale: Good Sitting balance - Comments: supervision due to impulsivity   Standing balance support: Bilateral upper extremity supported Standing balance-Leahy Scale: Poor(abe to stand with close supervision initially with RW for support.  Pt fatigued during session and then required min a x 2)                             ADL either performed or assessed with clinical judgement   ADL Overall ADL's : Needs assistance/impaired     Grooming: Oral care;Standing Grooming Details (indicate cue type and reason): Pt needed mod a to clean dentures due to poor LUE functional use. Pt also needed min a for standing at sink.                             Functional mobility during ADLs: +2 for physical assistance(min a x2 with RW for assist with LUE for walker as well as balance) General ADL Comments: Pt initially required min guard x 1 for sit to stand.  As  pt continued to work in therapy, required min a x2 for safe functional mobility     Vision       Perception     Praxis      Cognition Arousal/Alertness: Awake/alert Behavior During Therapy: WFL for tasks assessed/performed Overall Cognitive Status: Impaired/Different from baseline Area of Impairment: Safety/judgement;Problem solving(pt impulsive and also required cues for basic problem solving)                         Safety/Judgement: Decreased awareness of deficits;Decreased awareness of safety              Exercises     Shoulder  Instructions       General Comments Pt with L neglect, L bias in standing however balance and functional mobility improved today    Pertinent Vitals/ Pain       Pain Assessment: 0-10 Pain Score: 3  Pain Location: back pt has long history of back pain Pain Descriptors / Indicators: Aching Pain Intervention(s): Monitored during session  Home Living                                          Prior Functioning/Environment              Frequency  Min 3X/week        Progress Toward Goals  OT Goals(current goals can now be found in the care plan section)  Progress towards OT goals: Progressing toward goals  Acute Rehab OT Goals Patient Stated Goal: to return to independence OT Goal Formulation: With patient Time For Goal Achievement: 03/07/19 Potential to Achieve Goals: Good  Plan Discharge plan remains appropriate    Co-evaluation    PT/OT/SLP Co-Evaluation/Treatment: Yes Reason for Co-Treatment: For patient/therapist safety;To address functional/ADL transfers   OT goals addressed during session: ADL's and self-care      AM-PAC OT "6 Clicks" Daily Activity     Outcome Measure   Help from another person eating meals?: A Little Help from another person taking care of personal grooming?: A Little Help from another person toileting, which includes using toliet, bedpan, or urinal?: A Lot Help from another person bathing (including washing, rinsing, drying)?: A Lot Help from another person to put on and taking off regular upper body clothing?: A Lot Help from another person to put on and taking off regular lower body clothing?: A Lot 6 Click Score: 14    End of Session Equipment Utilized During Treatment: Gait belt  OT Visit Diagnosis: Unsteadiness on feet (R26.81);Muscle weakness (generalized) (M62.81);Hemiplegia and hemiparesis;Other symptoms and signs involving cognitive function Hemiplegia - Right/Left: Left Hemiplegia -  dominant/non-dominant: Dominant Hemiplegia - caused by: Cerebral infarction   Activity Tolerance Patient tolerated treatment well   Patient Left in bed(transport arrived for test )   Nurse Communication Mobility status        Time: [id number] OT Time Calculation (min): 29 min  Charges: OT General Charges $OT Visit: 1 Visit OT Treatments $Self Care/Home Management : 8-22 mins(Co treat with PT)     Norton Pastel[place]ul[place], Kiyan Burmester Halliday, OTR/L 02/22/2019, 10:51 AM

## 2019-02-22 NOTE — Procedures (Signed)
S/P 4 vessel cerebral arteriogram RT CFA approach. Findings. 1.13mm x 7.7 mm RT MCA  aneurysm

## 2019-02-22 NOTE — H&P (Signed)
History and Physical    Connie Oconnor ZOX:[id number] DOB: 1941/09/05 DOA: 02/20/2019  PCP: Lovenia Kim, PA-C Consultants:  Luiz Blare - orthopedics Patient coming from:  Home - lives alone; NOK: Daughter, Nat Math, [phone]  Chief Complaint: left-sided weakness  HPI: Connie Oconnor is a 78 y.o. female with medical history significant of right SAH from a fall (03/2017) and hypothyroidism presenting with left-sided weakness. She reports difficulty getting out of the bed this morning and when she did, she slid into the floor.  She couldn't get to the bathroom and wet the floor.  She finally managed to get ahold of her daughter and they called 911.  Her left arm and left leg were weak but she really couldn't use them for standing and she couldn't even push herself up to a sitting position in bed.  She was normal last night when she went to bed.  She did not wake up to void overnight.  No dysphagia but she had some mild dysarthria which she attributes to not having her teeth in her mouth.  She was told she was a little droopy on her face.  No prior h/o strokes. Left hemiparesis upon wakening, MRI shows a stroke (lacunar infarct in the right corona radiata).  Neurology has seen her.  Review of Systems: No acute issues or events overnight, patient continues to complain of left upper extremity weakness - minimally improving - indicates her left lower extremity weakness is nearly back to baseline.   Ambulatory Status:  Ambulates with a cane at baseline  Past Medical History:  Diagnosis Date   Anemia    hx of   Arthritis    Dislocation of right knee with medial meniscus tear    Headache    hx of migraines none since 1990's   Hypothyroidism    1978, no problems since   Pneumonia 1990's none since   PVC (premature ventricular contraction)    199'0's none since   SAH (subarachnoid hemorrhage) (HCC) 2016   On the right    Past Surgical History:  Procedure Laterality Date   BACK  SURGERY     CERVICAL LAMINECTOMY  1986   DILATION AND CURETTAGE OF UTERUS  1970   ELBOW SURGERY Right 1993   KNEE ARTHROSCOPY WITH SUBCHONDROPLASTY Right 01/11/2018   Procedure: KNEE ARTHROSCOPY WITH SUBCHONDROPLASTY, PARTIAL MEDIAL MENISCECTOMY, CHONDROPLASTY OF THE MEDIAL PATELLA FEMORAL CHONDYL, MEDIAL PLICA  EXCISION;  Surgeon: Jodi Geralds, MD;  Location: [facility];  Service: Orthopedics;  Laterality: Right;   LUMBAR LAMINECTOMY  1993   rods placed bone generated removed one year later    MULTIPLE TOOTH EXTRACTIONS     SHOULDER SURGERY Left    arthritis clean out and shortened collar bone dr Renae Fickle   TONSILLECTOMY  1970   VAGINAL HYSTERECTOMY  1971   partial dr Bunnie Domino TOOTH EXTRACTION      Social History   Socioeconomic History   Marital status: Widowed    Spouse name: Not on file   Number of children: Not on file   Years of education: Not on file   Highest education level: Not on file  Occupational History   Occupation: retired  Ecologist strain: Not on file   Food insecurity:    Worry: Not on file    Inability: Not on file   Transportation needs:    Medical: Not on file    Non-medical: Not on file  Tobacco Use   Smoking status: Current Every  Day Smoker    Packs/day: 1.00    Years: 72.00    Pack years: 72.00    Types: Cigarettes   Smokeless tobacco: Never Used  Substance and Sexual Activity   Alcohol use: No   Drug use: No   Sexual activity: Not on file  Lifestyle   Physical activity:    Days per week: Not on file    Minutes per session: Not on file   Stress: Not on file  Relationships   Social connections:    Talks on phone: Not on file    Gets together: Not on file    Attends religious service: Not on file    Active member of club or organization: Not on file    Attends meetings of clubs or organizations: Not on file    Relationship status: Not on file   Intimate partner violence:    Fear of  current or ex partner: Not on file    Emotionally abused: Not on file    Physically abused: Not on file    Forced sexual activity: Not on file  Other Topics Concern   Not on file  Social History Narrative   Not on file    Allergies  Allergen Reactions   Aspirin Other (See Comments)    shaking   Ibuprofen Other (See Comments)    Shaking   Penicillins Rash and Other (See Comments)    Has patient had a PCN reaction causing immediate rash, facial/tongue/throat swelling, SOB or lightheadedness with hypotension: No Has patient had a PCN reaction causing severe rash involving mucus membranes or skin necrosis: No Has patient had a PCN reaction that required hospitalization: Yes - was in hospital Has patient had a PCN reaction occurring within the last 10 years: No If all of the above answers are "NO", then may proceed with Cephalosporin use.     Family History  Problem Relation Age of Onset   Stroke Mother    Heart attack Father     Prior to Admission medications   Medication Sig Start Date End Date Taking? Authorizing Provider  acetaminophen (TYLENOL) 500 MG tablet Take 1,000 mg by mouth every 6 (six) hours as needed for mild pain or headache.     [provider]  cephALEXin (KEFLEX) 500 MG capsule Take 1 capsule (500 mg total) by mouth 4 (four) times daily. 03/15/18   Donnetta Hutching, MD  cyclobenzaprine (FLEXERIL) 10 MG tablet Take 10 mg by mouth 3 (three) times daily as needed for muscle spasms.    [provider]  diphenhydrAMINE (BENADRYL) 25 MG tablet Take 25 mg by mouth at bedtime as needed for allergies.     [provider]  glucosamine-chondroitin 500-400 MG tablet Take 1 tablet by mouth at bedtime.     [provider]  oxyCODONE-acetaminophen (PERCOCET/ROXICET) 5-325 MG tablet Take 1-2 tablets by mouth every 6 (six) hours as needed for severe pain. 01/11/18   Marshia Ly, PA-C  traMADol (ULTRAM) 50 MG tablet Take 50 mg by mouth every 8  (eight) hours as needed for moderate pain.    [provider]    Physical Exam: Vitals:   02/22/19 1230 02/22/19 1303 02/22/19 1318 02/22/19 1348  BP: (!) 130/57 130/65 (!) 132/59 (!) 138/56  Pulse: (!) 59 62 (!) 58 60  Resp: (!) Temp:      TempSrc:      SpO2: 96% 97% 98% 99%  Weight:      Height:  General:  Pleasantly resting in bed, No acute distress. HEENT:  Normocephalic atraumatic.  Sclerae nonicteric, noninjected.  Extraocular movements intact bilaterally. Neck:  Without mass or deformity.  Trachea is midline. Lungs:  Clear to auscultate bilaterally without rhonchi, wheeze, or rales. Heart:  Regular rate and rhythm.  Without murmurs, rubs, or gallops. Abdomen:  Soft, nontender, nondistended.  Without guarding or rebound. Extremities: Without cyanosis, clubbing, edema, or obvious deformity. Vascular:  Dorsalis pedis and posterior tibial pulses palpable bilaterally. Neuro: Left upper extremity proximal strength 5 out of 5, distal left upper extremity grip strength 2 out of 5 with equally weak 2 out of 5 strength flexors and extensors of the wrist.  Left lower extremity 4 out of 5 strength compared to right 5 out of 5 strength globally wise. Skin:  Warm and dry, no erythema, no ulcerations.   Radiological Exams on Admission: Mr Shirlee Latch FX Contrast  Result Date: 02/20/2019 CLINICAL DATA:  Stroke.  Left-sided weakness. EXAM: MRI HEAD WITHOUT CONTRAST MRA HEAD WITHOUT CONTRAST MRA NECK WITHOUT CONTRAST TECHNIQUE: Multiplanar, multiecho pulse sequences of the brain and surrounding structures were obtained without intravenous contrast. Angiographic images of the Circle of Willis were obtained using MRA technique without intravenous contrast. Angiographic images of the neck were obtained using MRA technique without intravenous contrast. Carotid stenosis measurements (when applicable) are obtained utilizing NASCET criteria, using the distal internal carotid  diameter as the denominator. COMPARISON:  CT head 03/29/2017 FINDINGS: MRI HEAD FINDINGS Brain: Small white matter infarct in the right corona radiata. No other acute infarct identified. Mild atrophy. Moderate to extensive chronic microvascular ischemic changes in the white matter and pons. Chronic infarcts in the basal ganglia and head of caudate on the right. Chronic microhemorrhage in the high right parietal lobe. No hematoma or fluid collection or mass lesion. No midline shift. Vascular: Normal arterial flow voids. Flow void right MCA bifurcation compatible with aneurysm. See MRA findings below. Skull and upper cervical spine: Negative Sinuses/Orbits: Mild mucosal edema paranasal sinuses.  Normal orbit Other: None MRA HEAD FINDINGS Severe stenosis distal left vertebral artery. Mild to moderate stenosis distal right vertebral artery and mild stenosis distal left vertebral artery. PICA patent bilaterally. Atherosclerotic basilar without significant stenosis. Superior cerebellar arteries widely patent bilaterally. Atherosclerotic disease with sequential moderate stenosis ease in the left PCA. Right PCA patent. Atherosclerotic disease right cavernous carotid with mild stenosis. Left cavernous carotid widely patent. Moderate stenosis A2 segment bilaterally. Mild atherosclerotic disease left middle cerebral artery M1 and M2 segments. Mild disease in right MCA branches 7 mm aneurysm right MCA bifurcation. MRA NECK FINDINGS Antegrade flow in the carotid arteries and vertebral arteries bilaterally. Right carotid bifurcation widely patent. Mild stenosis proximal left internal and external carotid arteries. Both vertebral arteries patent without proximal stenosis. 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 Atrophy and moderate to advanced chronic microvascular ischemia Negative for large vessel occlusion. Diffuse intracranial atherosclerotic disease as above. 7 mm aneurysm  right MCA bifurcation Mild stenosis left carotid bifurcation in the neck. Electronically Signed   By: Marlan Palau M.D.   On: 02/20/2019 14:37   Mr Maxine Glenn Neck Wo Contrast  Result Date: 02/20/2019 CLINICAL DATA:  Stroke.  Left-sided weakness. EXAM: MRI HEAD WITHOUT CONTRAST MRA HEAD WITHOUT CONTRAST MRA NECK WITHOUT CONTRAST TECHNIQUE: Multiplanar, multiecho pulse sequences of the brain and surrounding structures were obtained without intravenous contrast. Angiographic images of the Circle of Willis were obtained using MRA technique without intravenous contrast. Angiographic  images of the neck were obtained using MRA technique without intravenous contrast. Carotid stenosis measurements (when applicable) are obtained utilizing NASCET criteria, using the distal internal carotid diameter as the denominator. COMPARISON:  CT head 03/29/2017 FINDINGS: MRI HEAD FINDINGS Brain: Small white matter infarct in the right corona radiata. No other acute infarct identified. Mild atrophy. Moderate to extensive chronic microvascular ischemic changes in the white matter and pons. Chronic infarcts in the basal ganglia and head of caudate on the right. Chronic microhemorrhage in the high right parietal lobe. No hematoma or fluid collection or mass lesion. No midline shift. Vascular: Normal arterial flow voids. Flow void right MCA bifurcation compatible with aneurysm. See MRA findings below. Skull and upper cervical spine: Negative Sinuses/Orbits: Mild mucosal edema paranasal sinuses.  Normal orbit Other: None MRA HEAD FINDINGS Severe stenosis distal left vertebral artery. Mild to moderate stenosis distal right vertebral artery and mild stenosis distal left vertebral artery. PICA patent bilaterally. Atherosclerotic basilar without significant stenosis. Superior cerebellar arteries widely patent bilaterally. Atherosclerotic disease with sequential moderate stenosis ease in the left PCA. Right PCA patent. Atherosclerotic disease right  cavernous carotid with mild stenosis. Left cavernous carotid widely patent. Moderate stenosis A2 segment bilaterally. Mild atherosclerotic disease left middle cerebral artery M1 and M2 segments. Mild disease in right MCA branches 7 mm aneurysm right MCA bifurcation. MRA NECK FINDINGS Antegrade flow in the carotid arteries and vertebral arteries bilaterally. Right carotid bifurcation widely patent. Mild stenosis proximal left internal and external carotid arteries. Both vertebral arteries patent without proximal stenosis. 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 Atrophy and moderate to advanced chronic microvascular ischemia Negative for large vessel occlusion. Diffuse intracranial atherosclerotic disease as above. 7 mm aneurysm right MCA bifurcation Mild stenosis left carotid bifurcation in the neck. Electronically Signed   By: Marlan Palau M.D.   On: 02/20/2019 14:37   Mr Brain Wo Contrast  Result Date: 02/20/2019 CLINICAL DATA:  Stroke.  Left-sided weakness. EXAM: MRI HEAD WITHOUT CONTRAST MRA HEAD WITHOUT CONTRAST MRA NECK WITHOUT CONTRAST TECHNIQUE: Multiplanar, multiecho pulse sequences of the brain and surrounding structures were obtained without intravenous contrast. Angiographic images of the Circle of Willis were obtained using MRA technique without intravenous contrast. Angiographic images of the neck were obtained using MRA technique without intravenous contrast. Carotid stenosis measurements (when applicable) are obtained utilizing NASCET criteria, using the distal internal carotid diameter as the denominator. COMPARISON:  CT head 03/29/2017 FINDINGS: MRI HEAD FINDINGS Brain: Small white matter infarct in the right corona radiata. No other acute infarct identified. Mild atrophy. Moderate to extensive chronic microvascular ischemic changes in the white matter and pons. Chronic infarcts in the basal ganglia and head of caudate on the right. Chronic  microhemorrhage in the high right parietal lobe. No hematoma or fluid collection or mass lesion. No midline shift. Vascular: Normal arterial flow voids. Flow void right MCA bifurcation compatible with aneurysm. See MRA findings below. Skull and upper cervical spine: Negative Sinuses/Orbits: Mild mucosal edema paranasal sinuses.  Normal orbit Other: None MRA HEAD FINDINGS Severe stenosis distal left vertebral artery. Mild to moderate stenosis distal right vertebral artery and mild stenosis distal left vertebral artery. PICA patent bilaterally. Atherosclerotic basilar without significant stenosis. Superior cerebellar arteries widely patent bilaterally. Atherosclerotic disease with sequential moderate stenosis ease in the left PCA. Right PCA patent. Atherosclerotic disease right cavernous carotid with mild stenosis. Left cavernous carotid widely patent. Moderate stenosis A2 segment bilaterally. Mild atherosclerotic disease left middle cerebral artery  M1 and M2 segments. Mild disease in right MCA branches 7 mm aneurysm right MCA bifurcation. MRA NECK FINDINGS Antegrade flow in the carotid arteries and vertebral arteries bilaterally. Right carotid bifurcation widely patent. Mild stenosis proximal left internal and external carotid arteries. Both vertebral arteries patent without proximal stenosis. 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 Atrophy and moderate to advanced chronic microvascular ischemia Negative for large vessel occlusion. Diffuse intracranial atherosclerotic disease as above. 7 mm aneurysm right MCA bifurcation Mild stenosis left carotid bifurcation in the neck. Electronically Signed   By: Marlan Palauharles  Clark M.D.   On: 02/20/2019 14:37    EKG: Independently reviewed.  NSR with rate 65; nonspecific ST changes with no evidence of acute ischemia  Assessment/Plan Principal Problem:   Right-sided lacunar infarction [facility](HCC) Active Problems:   SAH  (subarachnoid hemorrhage) (HCC)   Hypothyroidism (acquired)   Acute CVA - R lacunar infarct -Patient with acute onset of left-sided hemiparesis -Uncertain time of onset and h/o SAH and so not a candidate for TPA -MRI is positive as above for acute right lacunar infarct, notable for incidental aneurysm -Echo unremarkable -Risk stratification:       -FLP(unremarkable), A1c(5.5); TSH (3.02, T4 0.7; both borderline low/normal) -Continue aspirin 81 and Plavix daily for 21 days, thereafter Plavix alone per neurology -Neurology consulted -appreciate insight and recommendations -PT/OT/ST/Nutrition Consults -CIR consult for placement -pending insurance approval; she lives alone and appears very likely to benefit from CIR and ongoing weakness ambulatory dysfunction and for assistance as well as ongoing PT OT in the setting of an acute infarct  Incidental 7mm R MCA bifurcation aneurysm -Neuro IR following -MRA today indicates right MCA 7.547mm x 1.8 mm aneurysm -will need outpatient follow-up for coiling -Continue dual anti-platelet therapy as above  HTN, reactive vs essential -Previously allowing for permissive hypertension -No history of HTN per patient - unclear if reactive 2/2 acute CVA as above -Blood pressure improving today, she does not currently take any blood pressure medications   HLD -Fasting lipid panel unremarkable -Continue Lipitor 40 mg daily  H/o SAH -Remote and associated with a fall -It should be ok to use DVT prevention-dose Lovenox in this patient  Hypothyroidism  -Check TSH and free T4 -Continue Synthroid at current dose for now  DVT prophylaxis:  Lovenox (on hold tonight due to Neuro IR procedure planned 4/23 am) Code Status: DNR Family Communication: None present Disposition Plan:  Inpatient, pending insurance approval for CIR placement Consults called: Neurology; CIR; PT/OT/ST/Nutrition  Admission status:  Remains inpatient.   Azucena FallenWilliam C Skyelyn Scruggs MD [facility]  02/22/2019, 2:03 PM

## 2019-02-22 NOTE — Progress Notes (Signed)
Inpatient Rehabilitation Admissions Coordinator  I will begin insurance approval for a possible inpt rehab admit pending insurance approval and bed availability. I will follow up tomorrow.  Ottie Glazier, RN, MSN Rehab Admissions Coordinator [phone] 02/22/2019 1:35 PM

## 2019-02-22 NOTE — Consult Note (Signed)
Chief Complaint: Patient was seen in consultation today for cerebral arteriogram Chief Complaint  Patient presents with   Cerebrovascular Accident   at the request of Dr Jerel Shepherd   Supervising Physician: Julieanne Cotton  Patient Status: [facility] - In-pt  History of Present Illness: Connie Oconnor is a 78 y.o. 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 Atrophy and moderate to advanced chronic microvascular ischemia Negative for large vessel occlusion. Diffuse intracranial atherosclerotic disease as above. 7 mm aneurysm right MCA bifurcation Mild stenosis left carotid bifurcation in the neck.  Request made for cerebral arteriogram for evaluation for aneurysm Dr Corliss Skains has reviewed imaging and approves procedure    Past Medical History:  Diagnosis Date   Anemia    hx of   Arthritis    Dislocation of right knee with medial meniscus tear    Headache    hx of migraines none since 1990's   Hypothyroidism    1978, no problems since   Pneumonia 1990's none since   PVC (premature ventricular contraction)    199'0's none since   SAH (subarachnoid hemorrhage) (HCC) 2016   On the right    Past Surgical History:  Procedure Laterality Date   BACK SURGERY     CERVICAL LAMINECTOMY  1986   DILATION AND CURETTAGE OF UTERUS  1970   ELBOW SURGERY Right 1993   KNEE ARTHROSCOPY WITH SUBCHONDROPLASTY Right 01/11/2018   Procedure: KNEE ARTHROSCOPY WITH SUBCHONDROPLASTY, PARTIAL MEDIAL MENISCECTOMY, CHONDROPLASTY OF THE MEDIAL PATELLA FEMORAL CHONDYL, MEDIAL PLICA  EXCISION;  Surgeon: Jodi Geralds, MD;  Location: [facility];  Service: Orthopedics;  Laterality: Right;   LUMBAR LAMINECTOMY  1993   rods placed bone generated removed one year later    MULTIPLE TOOTH EXTRACTIONS     SHOULDER SURGERY Left    arthritis clean out and shortened collar bone dr Renae Fickle     TONSILLECTOMY  1970   VAGINAL HYSTERECTOMY  1971   partial dr Bunnie Domino TOOTH EXTRACTION      Allergies: Aspirin; Ibuprofen; and Penicillins  Medications: Prior to Admission medications   Medication Sig Start Date End Date Taking? Authorizing Provider  acetaminophen (TYLENOL) 500 MG tablet Take 1,000 mg by mouth every 6 (six) hours as needed for mild pain or headache.    Yes [provider]  cyclobenzaprine (FLEXERIL) 10 MG tablet Take 10 mg by mouth 3 (three) times daily as needed for muscle spasms.   Yes [provider]  diphenhydrAMINE (BENADRYL) 25 MG tablet Take 25 mg by mouth at bedtime as needed for allergies.    Yes [provider]  traMADol (ULTRAM) 50 MG tablet Take 50 mg by mouth every 8 (eight) hours as needed for moderate pain.   Yes [provider]  oxyCODONE-acetaminophen (PERCOCET/ROXICET) 5-325 MG tablet Take 1-2 tablets by mouth every 6 (six) hours as needed for severe pain. Patient not taking: Reported on 02/20/2019 01/11/18   Marshia Ly, PA-C     Family History  Problem Relation Age of Onset   Stroke Mother    Heart attack Father     Social History   Socioeconomic History   Marital status: Widowed    Spouse name: Not on file   Number of children: Not on file   Years of education: Not on file   Highest education level: Not on file  Occupational History  Occupation: retired  Ecologist strain: Not on [employer] insecurity:    Worry: Not on file    Inability: Not on Occupational hygienist needs:    Medical: Not on file    Non-medical: Not on file  Tobacco Use   Smoking status: Current Every Day Smoker    Packs/day: 1.00    Years: 72.00    Pack years: 72.00    Types: Cigarettes   Smokeless tobacco: Never Used  Substance and Sexual Activity   Alcohol use: No   Drug use: No   Sexual activity: Not on file  Lifestyle   Physical activity:    Days per week: Not  on file    Minutes per session: Not on file   Stress: Not on file  Relationships   Social connections:    Talks on phone: Not on file    Gets together: Not on file    Attends religious service: Not on file    Active member of club or organization: Not on file    Attends meetings of clubs or organizations: Not on file    Relationship status: Not on file  Other Topics Concern   Not on file  Social History Narrative   Not on file    Review of Systems: A 12 point ROS discussed and pertinent positives are indicated in the HPI above.  All other systems are negative.  Review of Systems  Constitutional: Positive for activity change. Negative for fever and unexpected weight change.  HENT: Negative for hearing loss, tinnitus, trouble swallowing and voice change.   Respiratory: Negative for cough and shortness of breath.   Cardiovascular: Negative for chest pain.  Gastrointestinal: Negative for abdominal pain.  Musculoskeletal: Positive for gait problem. Negative for back pain.  Neurological: Positive for speech difficulty, weakness, numbness and headaches. Negative for dizziness, tremors, seizures, syncope, facial asymmetry and light-headedness.  Psychiatric/Behavioral: Negative for agitation, behavioral problems and confusion.    Vital Signs: BP (!) 144/68 (BP Location: Left Arm)    Pulse 66    Temp 98.9 F (37.2 C) (Oral)    Resp 15    Ht  (1.651 m)    Wt 155 lb (70.3 kg)    SpO2 95%    BMI 25.79 kg/m   Physical Exam Vitals signs reviewed.  Constitutional:      Appearance: Normal appearance.  HENT:     Head: Atraumatic.  Eyes:     Extraocular Movements: Extraocular movements intact.  Neck:     Musculoskeletal: Neck supple.  Cardiovascular:     Rate and Rhythm: Normal rate and regular rhythm.     Heart sounds: Normal heart sounds.  Pulmonary:     Breath sounds: Normal breath sounds.  Abdominal:     General: Bowel sounds are normal.     Tenderness: There is no  abdominal tenderness.  Musculoskeletal: Normal range of motion.     Comments: Left arm weak Left leg weak Able to move all 4s  Skin:    General: Skin is warm.  Neurological:     Mental Status: She is alert and oriented to person, place, and time.  Psychiatric:        Mood and Affect: Mood normal.        Behavior: Behavior normal.        Thought Content: Thought content normal.        Judgment: Judgment normal.     Imaging: Mr Maxine Glenn  Head Wo Contrast  Result Date: 02/20/2019 CLINICAL DATA:  Stroke.  Left-sided weakness. EXAM: MRI HEAD WITHOUT CONTRAST MRA HEAD WITHOUT CONTRAST MRA NECK WITHOUT CONTRAST TECHNIQUE: Multiplanar, multiecho pulse sequences of the brain and surrounding structures were obtained without intravenous contrast. Angiographic images of the Circle of Willis were obtained using MRA technique without intravenous contrast. Angiographic images of the neck were obtained using MRA technique without intravenous contrast. Carotid stenosis measurements (when applicable) are obtained utilizing NASCET criteria, using the distal internal carotid diameter as the denominator. COMPARISON:  CT head 03/29/2017 FINDINGS: MRI HEAD FINDINGS Brain: Small white matter infarct in the right corona radiata. No other acute infarct identified. Mild atrophy. Moderate to extensive chronic microvascular ischemic changes in the white matter and pons. Chronic infarcts in the basal ganglia and head of caudate on the right. Chronic microhemorrhage in the high right parietal lobe. No hematoma or fluid collection or mass lesion. No midline shift. Vascular: Normal arterial flow voids. Flow void right MCA bifurcation compatible with aneurysm. See MRA findings below. Skull and upper cervical spine: Negative Sinuses/Orbits: Mild mucosal edema paranasal sinuses.  Normal orbit Other: None MRA HEAD FINDINGS Severe stenosis distal left vertebral artery. Mild to moderate stenosis distal right vertebral artery and mild  stenosis distal left vertebral artery. PICA patent bilaterally. Atherosclerotic basilar without significant stenosis. Superior cerebellar arteries widely patent bilaterally. Atherosclerotic disease with sequential moderate stenosis ease in the left PCA. Right PCA patent. Atherosclerotic disease right cavernous carotid with mild stenosis. Left cavernous carotid widely patent. Moderate stenosis A2 segment bilaterally. Mild atherosclerotic disease left middle cerebral artery M1 and M2 segments. Mild disease in right MCA branches 7 mm aneurysm right MCA bifurcation. MRA NECK FINDINGS Antegrade flow in the carotid arteries and vertebral arteries bilaterally. Right carotid bifurcation widely patent. Mild stenosis proximal left internal and external carotid arteries. Both vertebral arteries patent without proximal stenosis. 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 Atrophy and moderate to advanced chronic microvascular ischemia Negative for large vessel occlusion. Diffuse intracranial atherosclerotic disease as above. 7 mm aneurysm right MCA bifurcation Mild stenosis left carotid bifurcation in the neck. Electronically Signed   By: Marlan Palau M.D.   On: 02/20/2019 14:37   Mr Maxine Glenn Neck Wo Contrast  Result Date: 02/20/2019 CLINICAL DATA:  Stroke.  Left-sided weakness. EXAM: MRI HEAD WITHOUT CONTRAST MRA HEAD WITHOUT CONTRAST MRA NECK WITHOUT CONTRAST TECHNIQUE: Multiplanar, multiecho pulse sequences of the brain and surrounding structures were obtained without intravenous contrast. Angiographic images of the Circle of Willis were obtained using MRA technique without intravenous contrast. Angiographic images of the neck were obtained using MRA technique without intravenous contrast. Carotid stenosis measurements (when applicable) are obtained utilizing NASCET criteria, using the distal internal carotid diameter as the denominator. COMPARISON:  CT head 03/29/2017  FINDINGS: MRI HEAD FINDINGS Brain: Small white matter infarct in the right corona radiata. No other acute infarct identified. Mild atrophy. Moderate to extensive chronic microvascular ischemic changes in the white matter and pons. Chronic infarcts in the basal ganglia and head of caudate on the right. Chronic microhemorrhage in the high right parietal lobe. No hematoma or fluid collection or mass lesion. No midline shift. Vascular: Normal arterial flow voids. Flow void right MCA bifurcation compatible with aneurysm. See MRA findings below. Skull and upper cervical spine: Negative Sinuses/Orbits: Mild mucosal edema paranasal sinuses.  Normal orbit Other: None MRA HEAD FINDINGS Severe stenosis distal left vertebral artery. Mild to moderate stenosis distal right vertebral artery  and mild stenosis distal left vertebral artery. PICA patent bilaterally. Atherosclerotic basilar without significant stenosis. Superior cerebellar arteries widely patent bilaterally. Atherosclerotic disease with sequential moderate stenosis ease in the left PCA. Right PCA patent. Atherosclerotic disease right cavernous carotid with mild stenosis. Left cavernous carotid widely patent. Moderate stenosis A2 segment bilaterally. Mild atherosclerotic disease left middle cerebral artery M1 and M2 segments. Mild disease in right MCA branches 7 mm aneurysm right MCA bifurcation. MRA NECK FINDINGS Antegrade flow in the carotid arteries and vertebral arteries bilaterally. Right carotid bifurcation widely patent. Mild stenosis proximal left internal and external carotid arteries. Both vertebral arteries patent without proximal stenosis. 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 Atrophy and moderate to advanced chronic microvascular ischemia Negative for large vessel occlusion. Diffuse intracranial atherosclerotic disease as above. 7 mm aneurysm right MCA bifurcation Mild stenosis left carotid bifurcation  in the neck. Electronically Signed   By: Marlan Palau M.D.   On: 02/20/2019 14:37   Mr Brain Wo Contrast  Result Date: 02/20/2019 CLINICAL DATA:  Stroke.  Left-sided weakness. EXAM: MRI HEAD WITHOUT CONTRAST MRA HEAD WITHOUT CONTRAST MRA NECK WITHOUT CONTRAST TECHNIQUE: Multiplanar, multiecho pulse sequences of the brain and surrounding structures were obtained without intravenous contrast. Angiographic images of the Circle of Willis were obtained using MRA technique without intravenous contrast. Angiographic images of the neck were obtained using MRA technique without intravenous contrast. Carotid stenosis measurements (when applicable) are obtained utilizing NASCET criteria, using the distal internal carotid diameter as the denominator. COMPARISON:  CT head 03/29/2017 FINDINGS: MRI HEAD FINDINGS Brain: Small white matter infarct in the right corona radiata. No other acute infarct identified. Mild atrophy. Moderate to extensive chronic microvascular ischemic changes in the white matter and pons. Chronic infarcts in the basal ganglia and head of caudate on the right. Chronic microhemorrhage in the high right parietal lobe. No hematoma or fluid collection or mass lesion. No midline shift. Vascular: Normal arterial flow voids. Flow void right MCA bifurcation compatible with aneurysm. See MRA findings below. Skull and upper cervical spine: Negative Sinuses/Orbits: Mild mucosal edema paranasal sinuses.  Normal orbit Other: None MRA HEAD FINDINGS Severe stenosis distal left vertebral artery. Mild to moderate stenosis distal right vertebral artery and mild stenosis distal left vertebral artery. PICA patent bilaterally. Atherosclerotic basilar without significant stenosis. Superior cerebellar arteries widely patent bilaterally. Atherosclerotic disease with sequential moderate stenosis ease in the left PCA. Right PCA patent. Atherosclerotic disease right cavernous carotid with mild stenosis. Left cavernous carotid  widely patent. Moderate stenosis A2 segment bilaterally. Mild atherosclerotic disease left middle cerebral artery M1 and M2 segments. Mild disease in right MCA branches 7 mm aneurysm right MCA bifurcation. MRA NECK FINDINGS Antegrade flow in the carotid arteries and vertebral arteries bilaterally. Right carotid bifurcation widely patent. Mild stenosis proximal left internal and external carotid arteries. Both vertebral arteries patent without proximal stenosis. 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 Atrophy and moderate to advanced chronic microvascular ischemia Negative for large vessel occlusion. Diffuse intracranial atherosclerotic disease as above. 7 mm aneurysm right MCA bifurcation Mild stenosis left carotid bifurcation in the neck. Electronically Signed   By: Marlan Palau M.D.   On: 02/20/2019 14:37    Labs:  CBC: Recent Labs    03/15/18 1833 02/20/19 1242 02/22/19 0356  WBC 5.9 6.9 5.5  HGB 15.4* 14.1 14.0  HCT 46.9* 44.6 42.6  PLT 139* 291 279    COAGS: Recent Labs  02/20/19 1242  INR 1.0  APTT 28    BMP: Recent Labs    03/15/18 1833 02/20/19 1242 02/22/19 0356  NA 137 141 141  K 4.1 3.8 3.6  CL 99* 104 106  CO2 GLUCOSE 113* 99 105*  BUN CALCIUM 9.5 9.4 9.1  CREATININE 0.92 0.89 0.82  GFRNONAA 59* >60 >60  GFRAA >60 >60 >60    LIVER FUNCTION TESTS: Recent Labs    03/15/18 1833 02/20/19 1242  BILITOT 1.0 0.5  AST 18 14*  ALT 17 15  ALKPHOS 69 54  PROT 7.7 6.3*  ALBUMIN 4.1 3.8    TUMOR MARKERS: No results for input(s): AFPTM, CEA, CA199, CHROMGRNA in the last 8760 hours.  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  This interventional procedure involves the use of X-rays and because of the nature of the planned procedure, it is possible that we will have prolonged use of X-ray fluoroscopy.  Potential radiation risks to you include (but are not limited to) the following: - A slightly elevated risk for cancer  several years later in life. This risk is typically less than 0.5% percent. This risk is low in comparison to the normal incidence of human cancer, which is 33% for women and 50% for men according to the [employer]. - Radiation induced injury can include skin redness, resembling a rash, tissue breakdown / ulcers and hair loss (which can be temporary or permanent).   The likelihood of either of these occurring depends on the difficulty of the procedure and whether you are sensitive to radiation due to previous procedures, disease, or genetic conditions.   IF your procedure requires a prolonged use of radiation, you will be notified and given written instructions for further action.  It is your responsibility to monitor the irradiated area for the 2 weeks following the procedure and to notify your physician if you are concerned that you have suffered a radiation induced injury.    All of the patient's questions were answered, patient is agreeable to proceed.  Consent signed and in chart.   Thank you for this interesting consult.  I greatly enjoyed meeting Hasset Chaviano and look forward to participating in their care.  A copy of this report was sent to the requesting provider on this date.  Electronically Signed: Robet Leu, PA-C 02/22/2019, 8:59 AM   I spent a total of 40 Minutes    in face to face in clinical consultation, greater than 50% of which was counseling/coordinating care for cerebral arteriogram

## 2019-02-22 NOTE — Progress Notes (Signed)
STROKE TEAM PROGRESS NOTE   INTERVAL HISTORY Pt lying in bed, just finished working with PT/OT. Pending cerebral angio this am.   Vitals:   02/22/19 1125 02/22/19 1130 02/22/19 1135 02/22/19 1147  BP: (!) 159/75 (!) 152/69 (!) 146/76 (!) 141/57  Pulse: 77 75 70 63  Resp: 19 18 (!) 21 14  Temp:      TempSrc:      SpO2: 99% 98% 97% 97%  Weight:      Height:        CBC:  Recent Labs  Lab 02/20/19 1242 02/22/19 0356  WBC 6.9 5.5  NEUTROABS 5.5 3.2  HGB 14.1 14.0  HCT 44.6 42.6  MCV 88.3 85.7  PLT 291 279    Basic Metabolic Panel:  Recent Labs  Lab 02/20/19 1242 02/22/19 0356  NA 141 141  K 3.8 3.6  CL 104 106  CO2 25 24  GLUCOSE 99 105*  BUN 8 10  CREATININE 0.89 0.82  CALCIUM 9.4 9.1   Lipid Panel:     Component Value Date/Time   CHOL 169 02/21/2019 0512   TRIG 73 02/21/2019 0512   HDL 56 02/21/2019 0512   CHOLHDL 3.0 02/21/2019 0512   VLDL 15 02/21/2019 0512   LDLCALC 98 02/21/2019 0512   HgbA1c:  Lab Results  Component Value Date   HGBA1C 5.5 02/21/2019   Urine Drug Screen:     Component Value Date/Time   LABOPIA NONE DETECTED 02/20/2019 1434   COCAINSCRNUR NONE DETECTED 02/20/2019 1434   LABBENZ NONE DETECTED 02/20/2019 1434   AMPHETMU NONE DETECTED 02/20/2019 1434   THCU NONE DETECTED 02/20/2019 1434   LABBARB NONE DETECTED 02/20/2019 1434    Alcohol Level     Component Value Date/Time   [facility] <10 02/20/2019 1242    IMAGING Mr Maxine Glenn Head Wo Contrast  Result Date: 02/20/2019 CLINICAL DATA:  Stroke.  Left-sided weakness. EXAM: MRI HEAD WITHOUT CONTRAST MRA HEAD WITHOUT CONTRAST MRA NECK WITHOUT CONTRAST TECHNIQUE: Multiplanar, multiecho pulse sequences of the brain and surrounding structures were obtained without intravenous contrast. Angiographic images of the Circle of Willis were obtained using MRA technique without intravenous contrast. Angiographic images of the neck were obtained using MRA technique without intravenous contrast. Carotid  stenosis measurements (when applicable) are obtained utilizing NASCET criteria, using the distal internal carotid diameter as the denominator. COMPARISON:  CT head 03/29/2017 FINDINGS: MRI HEAD FINDINGS Brain: Small white matter infarct in the right corona radiata. No other acute infarct identified. Mild atrophy. Moderate to extensive chronic microvascular ischemic changes in the white matter and pons. Chronic infarcts in the basal ganglia and head of caudate on the right. Chronic microhemorrhage in the high right parietal lobe. No hematoma or fluid collection or mass lesion. No midline shift. Vascular: Normal arterial flow voids. Flow void right MCA bifurcation compatible with aneurysm. See MRA findings below. Skull and upper cervical spine: Negative Sinuses/Orbits: Mild mucosal edema paranasal sinuses.  Normal orbit Other: None MRA HEAD FINDINGS Severe stenosis distal left vertebral artery. Mild to moderate stenosis distal right vertebral artery and mild stenosis distal left vertebral artery. PICA patent bilaterally. Atherosclerotic basilar without significant stenosis. Superior cerebellar arteries widely patent bilaterally. Atherosclerotic disease with sequential moderate stenosis ease in the left PCA. Right PCA patent. Atherosclerotic disease right cavernous carotid with mild stenosis. Left cavernous carotid widely patent. Moderate stenosis A2 segment bilaterally. Mild atherosclerotic disease left middle cerebral artery M1 and M2 segments. Mild disease in right MCA branches 7 mm aneurysm right MCA  bifurcation. MRA NECK FINDINGS Antegrade flow in the carotid arteries and vertebral arteries bilaterally. Right carotid bifurcation widely patent. Mild stenosis proximal left internal and external carotid arteries. Both vertebral arteries patent without proximal stenosis. 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 Atrophy and moderate to advanced chronic microvascular  ischemia Negative for large vessel occlusion. Diffuse intracranial atherosclerotic disease as above. 7 mm aneurysm right MCA bifurcation Mild stenosis left carotid bifurcation in the neck. Electronically Signed   By: Marlan Palau M.D.   On: 02/20/2019 14:37   Mr Maxine Glenn Neck Wo Contrast  Result Date: 02/20/2019 CLINICAL DATA:  Stroke.  Left-sided weakness. EXAM: MRI HEAD WITHOUT CONTRAST MRA HEAD WITHOUT CONTRAST MRA NECK WITHOUT CONTRAST TECHNIQUE: Multiplanar, multiecho pulse sequences of the brain and surrounding structures were obtained without intravenous contrast. Angiographic images of the Circle of Willis were obtained using MRA technique without intravenous contrast. Angiographic images of the neck were obtained using MRA technique without intravenous contrast. Carotid stenosis measurements (when applicable) are obtained utilizing NASCET criteria, using the distal internal carotid diameter as the denominator. COMPARISON:  CT head 03/29/2017 FINDINGS: MRI HEAD FINDINGS Brain: Small white matter infarct in the right corona radiata. No other acute infarct identified. Mild atrophy. Moderate to extensive chronic microvascular ischemic changes in the white matter and pons. Chronic infarcts in the basal ganglia and head of caudate on the right. Chronic microhemorrhage in the high right parietal lobe. No hematoma or fluid collection or mass lesion. No midline shift. Vascular: Normal arterial flow voids. Flow void right MCA bifurcation compatible with aneurysm. See MRA findings below. Skull and upper cervical spine: Negative Sinuses/Orbits: Mild mucosal edema paranasal sinuses.  Normal orbit Other: None MRA HEAD FINDINGS Severe stenosis distal left vertebral artery. Mild to moderate stenosis distal right vertebral artery and mild stenosis distal left vertebral artery. PICA patent bilaterally. Atherosclerotic basilar without significant stenosis. Superior cerebellar arteries widely patent bilaterally.  Atherosclerotic disease with sequential moderate stenosis ease in the left PCA. Right PCA patent. Atherosclerotic disease right cavernous carotid with mild stenosis. Left cavernous carotid widely patent. Moderate stenosis A2 segment bilaterally. Mild atherosclerotic disease left middle cerebral artery M1 and M2 segments. Mild disease in right MCA branches 7 mm aneurysm right MCA bifurcation. MRA NECK FINDINGS Antegrade flow in the carotid arteries and vertebral arteries bilaterally. Right carotid bifurcation widely patent. Mild stenosis proximal left internal and external carotid arteries. Both vertebral arteries patent without proximal stenosis. 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 Atrophy and moderate to advanced chronic microvascular ischemia Negative for large vessel occlusion. Diffuse intracranial atherosclerotic disease as above. 7 mm aneurysm right MCA bifurcation Mild stenosis left carotid bifurcation in the neck. Electronically Signed   By: Marlan Palau M.D.   On: 02/20/2019 14:37   Mr Brain Wo Contrast  Result Date: 02/20/2019 CLINICAL DATA:  Stroke.  Left-sided weakness. EXAM: MRI HEAD WITHOUT CONTRAST MRA HEAD WITHOUT CONTRAST MRA NECK WITHOUT CONTRAST TECHNIQUE: Multiplanar, multiecho pulse sequences of the brain and surrounding structures were obtained without intravenous contrast. Angiographic images of the Circle of Willis were obtained using MRA technique without intravenous contrast. Angiographic images of the neck were obtained using MRA technique without intravenous contrast. Carotid stenosis measurements (when applicable) are obtained utilizing NASCET criteria, using the distal internal carotid diameter as the denominator. COMPARISON:  CT head 03/29/2017 FINDINGS: MRI HEAD FINDINGS Brain: Small white matter infarct in the right corona radiata. No other acute infarct identified.  Mild atrophy. Moderate to extensive chronic microvascular  ischemic changes in the white matter and pons. Chronic infarcts in the basal ganglia and head of caudate on the right. Chronic microhemorrhage in the high right parietal lobe. No hematoma or fluid collection or mass lesion. No midline shift. Vascular: Normal arterial flow voids. Flow void right MCA bifurcation compatible with aneurysm. See MRA findings below. Skull and upper cervical spine: Negative Sinuses/Orbits: Mild mucosal edema paranasal sinuses.  Normal orbit Other: None MRA HEAD FINDINGS Severe stenosis distal left vertebral artery. Mild to moderate stenosis distal right vertebral artery and mild stenosis distal left vertebral artery. PICA patent bilaterally. Atherosclerotic basilar without significant stenosis. Superior cerebellar arteries widely patent bilaterally. Atherosclerotic disease with sequential moderate stenosis ease in the left PCA. Right PCA patent. Atherosclerotic disease right cavernous carotid with mild stenosis. Left cavernous carotid widely patent. Moderate stenosis A2 segment bilaterally. Mild atherosclerotic disease left middle cerebral artery M1 and M2 segments. Mild disease in right MCA branches 7 mm aneurysm right MCA bifurcation. MRA NECK FINDINGS Antegrade flow in the carotid arteries and vertebral arteries bilaterally. Right carotid bifurcation widely patent. Mild stenosis proximal left internal and external carotid arteries. Both vertebral arteries patent without proximal stenosis. 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 Atrophy and moderate to advanced chronic microvascular ischemia Negative for large vessel occlusion. Diffuse intracranial atherosclerotic disease as above. 7 mm aneurysm right MCA bifurcation Mild stenosis left carotid bifurcation in the neck. Electronically Signed   By: Marlan Palau M.D.   On: 02/20/2019 14:37   2D Echocardiogram  1. The left ventricle has normal systolic function with an ejection fraction  of 60-65%. The cavity size was normal. Left ventricular diastolic Doppler parameters are indeterminate. No evidence of left ventricular regional wall motion abnormalities.  2. The right ventricle has normal systolic function. The cavity was normal. There is no increase in right ventricular wall thickness. Right ventricular systolic pressure could not be assessed.  3. The aortic valve is tricuspid.  4. No intracardiac thrombi or masses were visualized.  Cerebral angio 02/22/2019 1.26mm x 7.7 mm RT MCA  aneurysm   PHYSICAL EXAM  Temp:  [97.9 F (36.6 C)-98.9 F (37.2 C)] 98.9 F (37.2 C) (04/23 0730) Pulse Rate:  [63-82] 63 (04/23 1147) Resp:  [14-21] 14 (04/23 1147) BP: (141-183)/(57-81) 141/57 (04/23 1147) SpO2:  [95 %-100 %] 97 % (04/23 1147)  General - Well nourished, well developed, in no apparent distress.  Ophthalmologic - fundi not visualized due to noncooperation.  Cardiovascular - Regular rate and rhythm.  Mental Status -  Level of arousal and orientation to time, place, and person were intact. Language including expression, naming, repetition, comprehension was assessed and found intact. Fund of Knowledge was assessed and was intact.  Cranial Nerves II - XII - II - Visual field intact OU. III, IV, VI - Extraocular movements intact. V - Facial sensation intact bilaterally. VII - mild left nasolabial fold flattening. VIII - Hearing & vestibular intact bilaterally. X - Palate elevates symmetrically. XI - Chin turning & shoulder shrug intact bilaterally. XII - Tongue protrusion intact.  Motor Strength - The patient's strength was normal in right upper and lower extremities, however, left upper extremity 4/5 proximal and 3-/5 finger grip, left lower extremity 5-/5 proximal and 5/5 distal and pronator drift was present on the left.  Bulk was normal and fasciculations were absent.   Motor Tone - Muscle tone was assessed at the neck and appendages  and was  normal.  Reflexes - The patient's reflexes were symmetrical in all extremities and she had no pathological reflexes.  Sensory - Light touch, temperature/pinprick were assessed and were symmetrical.    Coordination - The patient had normal movements in the hands with no ataxia or dysmetria.  Tremor was absent.  Gait and Station - deferred.   ASSESSMENT/PLAN Ms. Connie Oconnor is a 78 y.o. female with history of PVC, hypothyroidism, arthritis, migraines presenting with L sided weakness.   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 Diffuse IC atherosclerosis. 7mm R MCA aneurysm  MRA neck mild stenosis L ICA bifurcation  2D Echo EF 60-65%. No source of embolus   LDL 98  HgbA1c 5.5  Lovenox 40 mg sq daily for VTE prophylaxis  No antithrombotic prior to admission, now on aspirin 81 mg daily and clopidogrel 75 mg daily. Recommend DAPT for 3 weeks and then plavix alone. Hx feeling "jittery" when she takes full dose aspirin.   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 Corliss Skainseveshwar)  Dr. Corliss Skainseveshwar will plan for outpt coiling  Follow up with Dr. Corliss [facility]ain[facility]veshwar   [facility] status post [facility], CT showed small right frontal and right tentorial SAH  Seen by Dr. 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 Added amlodipine 10mg   . Long-term BP goal normotensive  Hyperlipidemia  Home meds:  No statin  Now on lipitor 40  LDL 98, goal < 70  Continue statin at discharge  Tobacco abuse  Current smoker  Smoking cessation counseling provided  One of the risk factor for cerebral aneurysm  Pt is willing to quit  Other Stroke  Risk Factors  Advanced age  Family hx stroke (mother)  Migraines - none since the [id number]  Other Active Problems  Hypothyroidism on synthroid  Arthritis  PVCs   Hospital day # 2  Neurology will sign off. Please call with questions. [facility]. Thanks for the consult.   Marvel PlanJindong Oluwatobi Visser, MD PhD Stroke Neurology 02/22/2019 12:03 PM    To contact Stroke Continuity provider, please refer to [email or web address]. After hours, contact General Neurology

## 2019-02-23 ENCOUNTER — Inpatient Hospital Stay (HOSPITAL_COMMUNITY)
Admission: RE | Admit: 2019-02-23 | Discharge: 2019-03-27 | DRG: 056 | Disposition: A | Discharge status: Skilled Nursing Facility | Payer: Medicare Other | Source: Intra-hospital | Attending: Physical Medicine & Rehabilitation | Admitting: Physical Medicine & Rehabilitation

## 2019-02-23 ENCOUNTER — Inpatient Hospital Stay (HOSPITAL_COMMUNITY): Payer: Medicare Other

## 2019-02-23 ENCOUNTER — Other Ambulatory Visit: Payer: Self-pay

## 2019-02-23 ENCOUNTER — Encounter (HOSPITAL_COMMUNITY): Payer: Self-pay | Admitting: Interventional Radiology

## 2019-02-23 DIAGNOSIS — K922 Gastrointestinal hemorrhage, unspecified: Secondary | ICD-10-CM

## 2019-02-23 DIAGNOSIS — R410 Disorientation, unspecified: Secondary | ICD-10-CM | POA: Diagnosis not present

## 2019-02-23 DIAGNOSIS — R4587 Impulsiveness: Secondary | ICD-10-CM

## 2019-02-23 DIAGNOSIS — M160 Bilateral primary osteoarthritis of hip: Secondary | ICD-10-CM | POA: Diagnosis present

## 2019-02-23 DIAGNOSIS — E785 Hyperlipidemia, unspecified: Secondary | ICD-10-CM | POA: Diagnosis present

## 2019-02-23 DIAGNOSIS — R739 Hyperglycemia, unspecified: Secondary | ICD-10-CM | POA: Diagnosis present

## 2019-02-23 DIAGNOSIS — F17210 Nicotine dependence, cigarettes, uncomplicated: Secondary | ICD-10-CM | POA: Diagnosis present

## 2019-02-23 DIAGNOSIS — I69322 Dysarthria following cerebral infarction: Secondary | ICD-10-CM | POA: Diagnosis not present

## 2019-02-23 DIAGNOSIS — K259 Gastric ulcer, unspecified as acute or chronic, without hemorrhage or perforation: Secondary | ICD-10-CM | POA: Diagnosis not present

## 2019-02-23 DIAGNOSIS — D62 Acute posthemorrhagic anemia: Secondary | ICD-10-CM | POA: Diagnosis present

## 2019-02-23 DIAGNOSIS — E039 Hypothyroidism, unspecified: Secondary | ICD-10-CM | POA: Diagnosis present

## 2019-02-23 DIAGNOSIS — G43909 Migraine, unspecified, not intractable, without status migrainosus: Secondary | ICD-10-CM | POA: Diagnosis present

## 2019-02-23 DIAGNOSIS — B9562 Methicillin resistant Staphylococcus aureus infection as the cause of diseases classified elsewhere: Secondary | ICD-10-CM | POA: Diagnosis not present

## 2019-02-23 DIAGNOSIS — K210 Gastro-esophageal reflux disease with esophagitis: Secondary | ICD-10-CM | POA: Diagnosis present

## 2019-02-23 DIAGNOSIS — I724 Aneurysm of artery of lower extremity: Secondary | ICD-10-CM

## 2019-02-23 DIAGNOSIS — R0989 Other specified symptoms and signs involving the circulatory and respiratory systems: Secondary | ICD-10-CM

## 2019-02-23 DIAGNOSIS — I959 Hypotension, unspecified: Secondary | ICD-10-CM | POA: Diagnosis not present

## 2019-02-23 DIAGNOSIS — R918 Other nonspecific abnormal finding of lung field: Secondary | ICD-10-CM

## 2019-02-23 DIAGNOSIS — K3189 Other diseases of stomach and duodenum: Secondary | ICD-10-CM | POA: Diagnosis not present

## 2019-02-23 DIAGNOSIS — I10 Essential (primary) hypertension: Secondary | ICD-10-CM | POA: Diagnosis present

## 2019-02-23 DIAGNOSIS — K254 Chronic or unspecified gastric ulcer with hemorrhage: Secondary | ICD-10-CM | POA: Diagnosis present

## 2019-02-23 DIAGNOSIS — D72829 Elevated white blood cell count, unspecified: Secondary | ICD-10-CM

## 2019-02-23 DIAGNOSIS — G8114 Spastic hemiplegia affecting left nondominant side: Secondary | ICD-10-CM | POA: Diagnosis not present

## 2019-02-23 DIAGNOSIS — Z20828 Contact with and (suspected) exposure to other viral communicable diseases: Secondary | ICD-10-CM | POA: Diagnosis present

## 2019-02-23 DIAGNOSIS — E162 Hypoglycemia, unspecified: Secondary | ICD-10-CM | POA: Diagnosis not present

## 2019-02-23 DIAGNOSIS — N390 Urinary tract infection, site not specified: Secondary | ICD-10-CM

## 2019-02-23 DIAGNOSIS — I639 Cerebral infarction, unspecified: Secondary | ICD-10-CM | POA: Diagnosis not present

## 2019-02-23 DIAGNOSIS — E46 Unspecified protein-calorie malnutrition: Secondary | ICD-10-CM

## 2019-02-23 DIAGNOSIS — R740 Nonspecific elevation of levels of transaminase and lactic acid dehydrogenase [LDH]: Secondary | ICD-10-CM | POA: Diagnosis present

## 2019-02-23 DIAGNOSIS — F05 Delirium due to known physiological condition: Secondary | ICD-10-CM | POA: Diagnosis present

## 2019-02-23 DIAGNOSIS — J9601 Acute respiratory failure with hypoxia: Secondary | ICD-10-CM

## 2019-02-23 DIAGNOSIS — I69391 Dysphagia following cerebral infarction: Secondary | ICD-10-CM

## 2019-02-23 DIAGNOSIS — I6381 Other cerebral infarction due to occlusion or stenosis of small artery: Secondary | ICD-10-CM | POA: Diagnosis not present

## 2019-02-23 DIAGNOSIS — M79662 Pain in left lower leg: Secondary | ICD-10-CM

## 2019-02-23 DIAGNOSIS — J449 Chronic obstructive pulmonary disease, unspecified: Secondary | ICD-10-CM | POA: Diagnosis present

## 2019-02-23 DIAGNOSIS — K449 Diaphragmatic hernia without obstruction or gangrene: Secondary | ICD-10-CM | POA: Diagnosis present

## 2019-02-23 DIAGNOSIS — Z90710 Acquired absence of both cervix and uterus: Secondary | ICD-10-CM

## 2019-02-23 DIAGNOSIS — R5383 Other fatigue: Secondary | ICD-10-CM

## 2019-02-23 DIAGNOSIS — I69354 Hemiplegia and hemiparesis following cerebral infarction affecting left non-dominant side: Secondary | ICD-10-CM | POA: Diagnosis present

## 2019-02-23 DIAGNOSIS — Z79891 Long term (current) use of opiate analgesic: Secondary | ICD-10-CM

## 2019-02-23 DIAGNOSIS — K257 Chronic gastric ulcer without hemorrhage or perforation: Secondary | ICD-10-CM

## 2019-02-23 DIAGNOSIS — Z880 Allergy status to penicillin: Secondary | ICD-10-CM

## 2019-02-23 DIAGNOSIS — K5901 Slow transit constipation: Secondary | ICD-10-CM | POA: Diagnosis present

## 2019-02-23 DIAGNOSIS — I82402 Acute embolism and thrombosis of unspecified deep veins of left lower extremity: Secondary | ICD-10-CM | POA: Diagnosis not present

## 2019-02-23 DIAGNOSIS — Z886 Allergy status to analgesic agent status: Secondary | ICD-10-CM

## 2019-02-23 DIAGNOSIS — Z720 Tobacco use: Secondary | ICD-10-CM

## 2019-02-23 DIAGNOSIS — E871 Hypo-osmolality and hyponatremia: Secondary | ICD-10-CM | POA: Diagnosis present

## 2019-02-23 DIAGNOSIS — Z8249 Family history of ischemic heart disease and other diseases of the circulatory system: Secondary | ICD-10-CM

## 2019-02-23 DIAGNOSIS — Z823 Family history of stroke: Secondary | ICD-10-CM

## 2019-02-23 DIAGNOSIS — F0150 Vascular dementia without behavioral disturbance: Secondary | ICD-10-CM | POA: Diagnosis present

## 2019-02-23 DIAGNOSIS — Z87820 Personal history of traumatic brain injury: Secondary | ICD-10-CM | POA: Diagnosis not present

## 2019-02-23 DIAGNOSIS — Z888 Allergy status to other drugs, medicaments and biological substances status: Secondary | ICD-10-CM

## 2019-02-23 DIAGNOSIS — K319 Disease of stomach and duodenum, unspecified: Secondary | ICD-10-CM | POA: Diagnosis present

## 2019-02-23 DIAGNOSIS — J439 Emphysema, unspecified: Secondary | ICD-10-CM

## 2019-02-23 DIAGNOSIS — Z716 Tobacco abuse counseling: Secondary | ICD-10-CM

## 2019-02-23 DIAGNOSIS — K209 Esophagitis, unspecified: Secondary | ICD-10-CM | POA: Diagnosis not present

## 2019-02-23 DIAGNOSIS — R52 Pain, unspecified: Secondary | ICD-10-CM

## 2019-02-23 DIAGNOSIS — Z79899 Other long term (current) drug therapy: Secondary | ICD-10-CM

## 2019-02-23 DIAGNOSIS — M1611 Unilateral primary osteoarthritis, right hip: Secondary | ICD-10-CM

## 2019-02-23 LAB — BASIC METABOLIC PANEL
Anion gap: 10 (ref 5–15)
BUN: 11 mg/dL (ref 8–23)
CO2: 22 mmol/L (ref 22–32)
Calcium: 9.4 mg/dL (ref 8.9–10.3)
Chloride: 106 mmol/L (ref 98–111)
Creatinine, Ser: 0.71 mg/dL (ref 0.44–1.00)
GFR calc Af Amer: >60 mL/min (ref >60)
GFR calc non Af Amer: >60 mL/min (ref >60)
Glucose, Bld: 113 mg/dL — ABNORMAL HIGH (ref 70–99)
Potassium: 3.9 mmol/L (ref 3.5–5.1)
Sodium: 138 mmol/L (ref 135–145)

## 2019-02-23 LAB — CBC WITH DIFFERENTIAL/PLATELET
Abs Immature Granulocytes: 0.04 10*3/uL (ref 0.00–0.07)
Basophils Absolute: 0 10*3/uL (ref 0.0–0.1)
Basophils Relative: 0 %
Eosinophils Absolute: 0 10*3/uL (ref 0.0–0.5)
Eosinophils Relative: 1 %
HCT: 38.9 % (ref 36.0–46.0)
Hemoglobin: 13.3 g/dL (ref 12.0–15.0)
Immature Granulocytes: 1 %
Lymphocytes Relative: 12 %
Lymphs Abs: 1.1 10*3/uL (ref 0.7–4.0)
MCH: 29 pg (ref 26.0–34.0)
MCHC: 34.2 g/dL (ref 30.0–36.0)
MCV: 84.9 fL (ref 80.0–100.0)
Monocytes Absolute: 0.8 10*3/uL (ref 0.1–1.0)
Monocytes Relative: 9 %
Neutro Abs: 6.9 10*3/uL (ref 1.7–7.7)
Neutrophils Relative %: 77 %
Platelets: 276 10*3/uL (ref 150–400)
RBC: 4.58 MIL/uL (ref 3.87–5.11)
RDW: 13.2 % (ref 11.5–15.5)
WBC: 8.9 10*3/uL (ref 4.0–10.5)
nRBC: 0 % (ref 0.0–0.2)

## 2019-02-23 MED ORDER — CYCLOBENZAPRINE HCL 10 MG PO TABS
10.0000 mg | ORAL_TABLET | Freq: Three times a day (TID) | ORAL | Status: DC | PRN
  Administered 2019-02-28 – 2019-03-13 (×11): 10 mg via ORAL
  Filled 2019-02-23 (×12): qty 1

## 2019-02-23 MED ORDER — AMLODIPINE BESYLATE 10 MG PO TABS
10.0000 mg | ORAL_TABLET | Freq: Every day | ORAL | Status: DC
  Administered 2019-02-24: 10:00:00 10 mg via ORAL
  Filled 2019-02-23: qty 1

## 2019-02-23 MED ORDER — AMLODIPINE BESYLATE 10 MG PO TABS: 10.0000 mg | ORAL_TABLET | Freq: Every day | ORAL | 0 refills | Status: DC

## 2019-02-23 MED ORDER — ACETAMINOPHEN 160 MG/5ML PO SOLN: 650.0000 mg | ORAL | Status: DC | PRN

## 2019-02-23 MED ORDER — ATORVASTATIN CALCIUM 40 MG PO TABS: 40.0000 mg | ORAL_TABLET | Freq: Every day | ORAL | 0 refills | Status: AC

## 2019-02-23 MED ORDER — CLOPIDOGREL BISULFATE 75 MG PO TABS
75.0000 mg | ORAL_TABLET | Freq: Every day | ORAL | Status: DC
  Administered 2019-02-24 – 2019-03-27 (×32): 75 mg via ORAL
  Filled 2019-02-23 (×32): qty 1

## 2019-02-23 MED ORDER — CLOPIDOGREL BISULFATE 75 MG PO TABS: 75.0000 mg | ORAL_TABLET | Freq: Every day | ORAL | 0 refills | Status: AC

## 2019-02-23 MED ORDER — ASPIRIN 81 MG PO TBEC: 81.0000 mg | DELAYED_RELEASE_TABLET | Freq: Every day | ORAL | 0 refills | Status: DC

## 2019-02-23 MED ORDER — ENSURE ENLIVE PO LIQD
237.0000 mL | Freq: Two times a day (BID) | ORAL | Status: DC
  Administered 2019-02-24 – 2019-03-07 (×20): 237 mL via ORAL

## 2019-02-23 MED ORDER — SENNOSIDES-DOCUSATE SODIUM 8.6-50 MG PO TABS
1.0000 | ORAL_TABLET | Freq: Every evening | ORAL | Status: DC | PRN
  Administered 2019-02-24 – 2019-03-03 (×5): 1 via ORAL
  Filled 2019-02-23 (×5): qty 1

## 2019-02-23 MED ORDER — ENSURE ENLIVE PO LIQD: 237.0000 mL | Freq: Two times a day (BID) | ORAL | 12 refills | Status: DC

## 2019-02-23 MED ORDER — ACETAMINOPHEN 325 MG PO TABS
650.0000 mg | ORAL_TABLET | ORAL | Status: DC | PRN
  Administered 2019-02-27 – 2019-03-27 (×30): 650 mg via ORAL
  Filled 2019-02-23 (×31): qty 2

## 2019-02-23 MED ORDER — ATORVASTATIN CALCIUM 40 MG PO TABS
40.0000 mg | ORAL_TABLET | Freq: Every day | ORAL | Status: DC
  Administered 2019-02-23 – 2019-03-15 (×21): 40 mg via ORAL
  Filled 2019-02-23 (×22): qty 1

## 2019-02-23 MED ORDER — ASPIRIN EC 81 MG PO TBEC
81.0000 mg | DELAYED_RELEASE_TABLET | Freq: Every day | ORAL | Status: DC
  Administered 2019-02-24 – 2019-03-27 (×32): 81 mg via ORAL
  Filled 2019-02-23 (×32): qty 1

## 2019-02-23 MED ORDER — ACETAMINOPHEN 650 MG RE SUPP: 650.0000 mg | RECTAL | Status: DC | PRN

## 2019-02-23 MED ORDER — TRAMADOL HCL 50 MG PO TABS
50.0000 mg | ORAL_TABLET | Freq: Three times a day (TID) | ORAL | Status: DC | PRN
  Administered 2019-02-26 – 2019-03-14 (×16): 50 mg via ORAL
  Filled 2019-02-23 (×17): qty 1

## 2019-02-23 NOTE — TOC Transition Note (Signed)
Transition of Care [facility]) - CM/SW Discharge Note   Patient Details  Name: Connie Oconnor MRN: [id number] Date of Birth: 20-Jan-1941  [facility] CM/SW Contact:  Kermit Balo, RN Phone Number: 02/23/2019, 2:04 PM   Clinical Narrative:    Pt is discharging to CIR today. CM signing off.    Final next level of care: IP Rehab Facility Barriers to Discharge: Continued Medical Work up   Patient Goals and CMS Choice     Choice offered to / list presented to : Patient  Discharge Placement                       Discharge Plan and Services   Discharge Planning Services: CM Consult Post Acute Care Choice: IP Rehab                               Social Determinants of Health (SDOH) Interventions     Readmission Risk Interventions No flowsheet data found.

## 2019-02-23 NOTE — Discharge Summary (Signed)
History and Physical    Connie Oconnor ZOX:[id number] DOB: [birth year] DOA: 02/20/2019  PCP: Lovenia Kim, PA-C Consultants:  Lu[facility] Blare - orthopedics Patient coming from:  Home - lives alone; NOK: Daughter, Nat Math, [phone]  Chief Complaint: left-sided weakness  HPI: Connie Oconnor is a 78 y.o. female with medical history significant of right SAH from a fall (03/2017) and hypothyroidism presenting with left-sided weakness. She reports difficulty getting out of the bed this morning and when she did, she slid into the floor.  She couldn't get to the bathroom and wet the floor.  She finally managed to get ahold of her daughter and they called 911.  Her left arm and left leg were weak but she really couldn't use them for standing and she couldn't even push herself up to a sitting position in bed.  She was normal last night when she went to bed.  She did not wake up to void overnight.  No dysphagia but she had some mild dysarthria which she attributes to not having her teeth in her mouth.  She was told she was a little droopy on her face.  No prior h/o strokes. Left hemiparesis upon wakening, MRI shows a stroke (lacunar infarct in the right corona radiata).  Neurology has seen her.  Hospital Course: Patient admitted as above with acute LUE/LLE weakness and notable acute infarct on imaging. Patient continues to have ongoing weakness on the left side with ongoing ambulatory dysfunction. Currently patient continues to improve but is not yet back to baseline, patient has been evaluated by inpatient rehab and accepted for other treatment and evaluation per their expertise today.  Patient will be continued on Plavix and aspirin for 21 days, thereafter patient will be on Plavix only.  Patient also had incidentally noted a millimeter right MCA bifurcation aneurysm evaluated by neuro interventional radiology who recommended further outpatient follow-up and coiling their expertise. Patient otherwise stable and  agreeable for discharge.  Review of Systems: No acute issues or events overnight, patient continues to complain of left upper extremity weakness -with minimal to no improvement over the past 24 hours, indicates her left lower extremity weakness is nearly back to baseline.   Ambulatory Status:  Ambulates with a cane at baseline  Past Medical History:  Diagnosis Date   Anemia    hx of   Arthritis    Dislocation of right knee with medial meniscus tear    Headache    hx of migraines none since 1990's   Hypothyroidism    1978, no problems since   Pneumonia 1990's none since   PVC (premature ventricular contraction)    199'0's none since   SAH (subarachnoid hemorrhage) (HCC) 2016   On the right    Past Surgical History:  Procedure Laterality Date   BACK SURGERY     CERVICAL LAMINECTOMY  1986   DILATION AND CURETTAGE OF UTERUS  1970   ELBOW SURGERY Right 1993   IR ANGIO INTRA EXTRACRAN SEL COM CAROTID INNOMINATE BILAT MOD SED  02/22/2019   IR ANGIO VERTEBRAL SEL SUBCLAVIAN INNOMINATE UNI L MOD SED  02/22/2019   IR ANGIO VERTEBRAL SEL VERTEBRAL UNI R MOD SED  02/22/2019   KNEE ARTHROSCOPY WITH SUBCHONDROPLASTY Right 01/11/2018   Procedure: KNEE ARTHROSCOPY WITH SUBCHONDROPLASTY, PARTIAL MEDIAL MENISCECTOMY, CHONDROPLASTY OF THE MEDIAL PATELLA FEMORAL CHONDYL, MEDIAL PLICA  EXCISION;  Surgeon: Jodi Geralds, MD;  Location: [facility];  Service: Orthopedics;  Laterality: Right;   LUMBAR LAMINECTOMY  1993   rods placed bone generated  removed one year later    MULTIPLE TOOTH EXTRACTIONS     SHOULDER SURGERY Left    arthritis clean out and shortened collar bone dr Renae Fickle   TONSILLECTOMY  1970   VAGINAL HYSTERECTOMY  1971   partial dr Bunnie Domino TOOTH EXTRACTION      Social History   Socioeconomic History   Marital status: Widowed    Spouse name: Not on file   Number of children: Not on file   Years of education: Not on file   Highest education level: Not on  file  Occupational History   Occupation: retired  Ecologist strain: Not on file   Food insecurity:    Worry: Not on file    Inability: Not on file   Transportation needs:    Medical: Not on file    Non-medical: Not on file  Tobacco Use   Smoking status: Current Every Day Smoker    Packs/day: 1.00    Years: 72.00    Pack years: 72.00    Types: Cigarettes   Smokeless tobacco: Never Used  Substance and Sexual Activity   Alcohol use: No   Drug use: No   Sexual activity: Not on file  Lifestyle   Physical activity:    Days per week: Not on file    Minutes per session: Not on file   Stress: Not on file  Relationships   Social connections:    Talks on phone: Not on file    Gets together: Not on file    Attends religious service: Not on file    Active member of club or organization: Not on file    Attends meetings of clubs or organizations: Not on file    Relationship status: Not on file   Intimate partner violence:    Fear of current or ex partner: Not on file    Emotionally abused: Not on file    Physically abused: Not on file    Forced sexual activity: Not on file  Other Topics Concern   Not on file  Social History Narrative   Not on file    Allergies  Allergen Reactions   Aspirin Other (See Comments)    shaking   Ibuprofen Other (See Comments)    Shaking   Penicillins Rash and Other (See Comments)    Has patient had a PCN reaction causing immediate rash, facial/tongue/throat swelling, SOB or lightheadedness with hypotension: No Has patient had a PCN reaction causing severe rash involving mucus membranes or skin necrosis: No Has patient had a PCN reaction that required hospitalization: Yes - was in hospital Has patient had a PCN reaction occurring within the last 10 years: No If all of the above answers are "NO", then may proceed with Cephalosporin use.     Family History  Problem Relation Age of Onset   Stroke Mother     Heart attack Father     Prior to Admission medications   Medication Sig Start Date End Date Taking? Authorizing Provider  acetaminophen (TYLENOL) 500 MG tablet Take 1,000 mg by mouth every 6 (six) hours as needed for mild pain or headache.     [provider]  cephALEXin (KEFLEX) 500 MG capsule Take 1 capsule (500 mg total) by mouth 4 (four) times daily. 03/15/18   Donnetta Hutching, MD  cyclobenzaprine (FLEXERIL) 10 MG tablet Take 10 mg by mouth 3 (three) times daily as needed for muscle spasms.    [provider]  diphenhydrAMINE (BENADRYL) 25 MG tablet Take 25 mg by mouth at bedtime as needed for allergies.     [provider]  glucosamine-chondroitin 500-400 MG tablet Take 1 tablet by mouth at bedtime.     [provider]  oxyCODONE-acetaminophen (PERCOCET/ROXICET) 5-325 MG tablet Take 1-2 tablets by mouth every 6 (six) hours as needed for severe pain. 01/11/18   Marshia Ly, PA-C  traMADol (ULTRAM) 50 MG tablet Take 50 mg by mouth every 8 (eight) hours as needed for moderate pain.    [provider]    Physical Exam: Vitals:   02/23/19 0600 02/23/19 0700 02/23/19 0830 02/23/19 1129  BP: 113/78 (!) 117/44 135/64 121/68  Pulse: 73 65 73 75  Resp: Temp:   98.3 F (36.8 C) (!) 97.5 F (36.4 C)  TempSrc:   Oral Oral  SpO2: 97% 97% 98% 97%  Weight:      Height:         General:  Pleasantly resting in bed, No acute distress. HEENT:  Normocephalic atraumatic.  Sclerae nonicteric, noninjected.  Extraocular movements intact bilaterally. Neck:  Without mass or deformity.  Trachea is midline. Lungs:  Clear to auscultate bilaterally without rhonchi, wheeze, or rales. Heart:  Regular rate and rhythm.  Without murmurs, rubs, or gallops. Abdomen:  Soft, nontender, nondistended.  Without guarding or rebound. Extremities: Without cyanosis, clubbing, edema, or obvious deformity. Vascular:  Dorsalis pedis and posterior tibial pulses  palpable bilaterally. Neuro: Left upper extremity proximal strength 5 out of 5, distal left upper extremity grip strength 2 out of 5 with equally weak 2 out of 5 strength flexors and extensors of the wrist.  Left lower extremity 4 out of 5 strength compared to right 5 out of 5 strength globally wise. Skin:  Warm and dry, no erythema, no ulcerations.   Radiological Exams on Admission: Ir Angio Intra Extracran Sel Com Carotid Innominate Bilat Mod Sed  Result Date: 02/23/2019 CLINICAL DATA:  Recent history of a right-sided ischemic stroke with left-sided weakness. 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 EXAM: BILATERAL COMMON CAROTID AND INNOMINATE ANGIOGRAPHY; IR ANGIO VERTEBRAL SEL SUBCLAVIAN INNOMINATE UNI LEFT MOD SED; IR ANGIO VERTEBRAL SEL VERTEBRAL UNI RIGHT MOD SED COMPARISON:  MRI MRA of the brain of 02/20/2019. MEDICATIONS: Heparin 1000 units IV; no antibiotic was administered within 1 hour of the procedure. ANESTHESIA/SEDATION: Versed 1 mg IV; Fentanyl 50 mcg IV.  Apresoline  5 mg IV. Moderate Sedation Time:  37 minutes The patient was continuously monitored during the procedure by the interventional radiology nurse under my direct supervision. CONTRAST:  Isovue 300 approximately 60 mL. FLUOROSCOPY TIME:  Fluoroscopy Time: 15 minutes 18 seconds (745 mGy). COMPLICATIONS: None immediate. TECHNIQUE: Informed written consent was obtained from the patient after a thorough discussion of the procedural risks, benefits and alternatives. All questions were addressed. Maximal Sterile Barrier Technique was utilized including caps, mask, sterile gowns, sterile gloves, sterile drape, hand hygiene and skin antiseptic. A timeout was performed prior to the initiation of the procedure. The right groin was prepped and draped in the usual sterile fashion. Thereafter using modified Seldinger technique, transfemoral access into the right common femoral artery was obtained without difficulty. Over a 0.035  inch guidewire, a 5 French Pinnacle sheath was inserted. Through this, and also over 0.035 inch guidewire, a 5 [place] JB 1 catheter was advanced to the aortic arch region and selectively positioned in the right common carotid artery, , the right vertebral artery, the  left common carotid artery and the left subclavian artery. FINDINGS: The right vertebral artery origin is widely patent. The vessel is seen to meander with moderate tortuosity to the right vertebrobasilar junction. Wide patency is seen in the right vertebrobasilar junction and the right posterior-inferior cerebellar artery. The basilar artery, the posterior cerebral arteries, the superior cerebellar arteries and the anterior- inferior cerebellar arteries opacify into the capillary and venous phases. There is approximately 50% stenosis of the left posterior cerebral artery in the P1 P2 junction secondary to intracranial arteriosclerosis. There is mild caliber irregularity of the mid basilar artery also probably related to intracranial arteriosclerosis. The right common carotid arteriogram demonstrates mild stenosis of the right external carotid artery. Its branches are normally opacified. The right internal carotid artery has a smooth shallow plaque along the posterior wall without evidence of significant stenosis by the NASCET criteria. No evidence of ulcerations or of intraluminal filling defects noted either. The vessel opacifies to the cranial skull base there. The petrous, cavernous and the supraclinoid segments are widely patent. The right middle cerebral artery and the right anterior cerebral artery opacify into the capillary and venous phases. Mild caliber irregularity of the pericallosal A2 region suggests mild intracranial arteriosclerosis. At the right MCA bifurcation a lobulated prominent aneurysm is seen measuring approximately 8.1 mm x 7.7 mm. The left subclavian arteriogram demonstrates patency of the left vertebral artery origin  associated with mild tortuosity. More distally the vessel opacifies to the cranial skull base. Wide patency is seen of the left vertebrobasilar junction and the left posterior inferior cerebellar artery. The opacified portions of the basilar artery demonstrates wide patency with flow noted in the posterior cerebral arteries, superior cerebellar arteries and the anterior inferior cerebellar arteries. Mixing with unopacified blood is seen in the basilar artery from the contralateral right vertebral artery. The left common carotid arteriogram demonstrates the origin of the left external carotid artery and its major branches to be widely patent. The left internal carotid artery at the bulb demonstrates a minimal shallow plaque without ulcerations. The vessel is seen to opacify to the cranial skull base. The petrous, cavernous and supraclinoid segments are widely patent. The left middle cerebral artery and the left anterior cerebral artery opacify into the capillary and venous phases. There is mild caliber stenosis of the distal left anterior A1 segment. Also noted is mild caliber irregularity and narrowing of the left pericallosal artery in the A2 region. IMPRESSION: Approximately 8.1 mm x 7.7 mm bilobed right MCA bifurcation aneurysm. Approximately 50% stenosis of the left posterior cerebral artery in the P1 P2 junction. 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 PLAN: Findings discussed with patient's referring stroke neurologist, and also the patient herself. Plan endovascular treatment of the right middle cerebral artery aneurysm following patient's rehab. Patient already on aspirin and Plavix. Electronically Signed   By: Julieanne CottonSanjeev  Deveshwar M.D.   On: 02/22/2019 12:26   Ir Angio Vertebral Sel Subclavian Innominate Uni L Mod Sed  Result Date: 02/23/2019 CLINICAL DATA:  Recent history of a right-sided ischemic stroke with left-sided weakness. Discovery of a  right middle cerebral artery aneurysm. EXAM: BILATERAL COMMON CAROTID AND INNOMINATE ANGIOGRAPHY; IR ANGIO VERTEBRAL SEL SUBCLAVIAN INNOMINATE UNI LEFT MOD SED; IR ANGIO VERTEBRAL SEL VERTEBRAL UNI RIGHT MOD SED COMPARISON:  MRI MRA of the brain of 02/20/2019. MEDICATIONS: Heparin 1000 units IV; no antibiotic was administered within 1 hour of the procedure. ANESTHESIA/SEDATION: Versed 1 mg IV; Fentanyl 50 mcg IV.  Apresoline  5  mg IV. Moderate Sedation Time:  37 minutes The patient was continuously monitored during the procedure by the interventional radiology nurse under my direct supervision. CONTRAST:  Isovue 300 approximately 60 mL. FLUOROSCOPY TIME:  Fluoroscopy Time: 15 minutes 18 seconds (745 mGy). COMPLICATIONS: None immediate. TECHNIQUE: Informed written consent was obtained from the patient after a thorough discussion of the procedural risks, benefits and alternatives. All questions were addressed. Maximal Sterile Barrier Technique was utilized including caps, mask, sterile gowns, sterile gloves, sterile drape, hand hygiene and skin antiseptic. A timeout was performed prior to the initiation of the procedure. The right groin was prepped and draped in the usual sterile fashion. Thereafter using modified Seldinger technique, transfemoral access into the right common femoral artery was obtained without difficulty. Over a 0.035 inch guidewire, a 5 French Pinnacle sheath was inserted. Through this, and also over 0.035 inch guidewire, a 5 [place] JB 1 catheter was advanced to the aortic arch region and selectively positioned in the right common carotid artery, , the right vertebral artery, the left common carotid artery and the left subclavian artery. FINDINGS: The right vertebral artery origin is widely patent. The vessel is seen to meander with moderate tortuosity to the right vertebrobasilar junction. Wide patency is seen in the right vertebrobasilar junction and the right posterior-inferior cerebellar artery.  The basilar artery, the posterior cerebral arteries, the superior cerebellar arteries and the anterior- inferior cerebellar arteries opacify into the capillary and venous phases. There is approximately 50% stenosis of the left posterior cerebral artery in the P1 P2 junction secondary to intracranial arteriosclerosis. There is mild caliber irregularity of the mid basilar artery also probably related to intracranial arteriosclerosis. The right common carotid arteriogram demonstrates mild stenosis of the right external carotid artery. Its branches are normally opacified. The right internal carotid artery has a smooth shallow plaque along the posterior wall without evidence of significant stenosis by the NASCET criteria. No evidence of ulcerations or of intraluminal filling defects noted either. The vessel opacifies to the cranial skull base there. The petrous, cavernous and the supraclinoid segments are widely patent. The right middle cerebral artery and the right anterior cerebral artery opacify into the capillary and venous phases. Mild caliber irregularity of the pericallosal A2 region suggests mild intracranial arteriosclerosis. At the right MCA bifurcation a lobulated prominent aneurysm is seen measuring approximately 8.1 mm x 7.7 mm. The left subclavian arteriogram demonstrates patency of the left vertebral artery origin associated with mild tortuosity. More distally the vessel opacifies to the cranial skull base. Wide patency is seen of the left vertebrobasilar junction and the left posterior inferior cerebellar artery. The opacified portions of the basilar artery demonstrates wide patency with flow noted in the posterior cerebral arteries, superior cerebellar arteries and the anterior inferior cerebellar arteries. Mixing with unopacified blood is seen in the basilar artery from the contralateral right vertebral artery. The left common carotid arteriogram demonstrates the origin of the left external carotid  artery and its major branches to be widely patent. The left internal carotid artery at the bulb demonstrates a minimal shallow plaque without ulcerations. The vessel is seen to opacify to the cranial skull base. The petrous, cavernous and supraclinoid segments are widely patent. The left middle cerebral artery and the left anterior cerebral artery opacify into the capillary and venous phases. There is mild caliber stenosis of the distal left anterior A1 segment. Also noted is mild caliber irregularity and narrowing of the left pericallosal artery in the A2 region. IMPRESSION: Approximately 8.1 mm  x 7.7 mm bilobed right MCA bifurcation aneurysm. Approximately 50% stenosis of the left posterior cerebral artery in the P1 P2 junction. 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 PLAN: Findings discussed with patient's referring stroke neurologist, and also the patient herself. Plan endovascular treatment of the right middle cerebral artery aneurysm following patient's rehab. Patient already on aspirin and Plavix. Electronically Signed   By: Julieanne Cotton M.D.   On: 02/22/2019 12:26   Ir Angio Vertebral Sel Vertebral Uni R Mod Sed  Result Date: 02/23/2019 CLINICAL DATA:  Recent history of a right-sided ischemic stroke with left-sided weakness. 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 EXAM: BILATERAL COMMON CAROTID AND INNOMINATE ANGIOGRAPHY; IR ANGIO VERTEBRAL SEL SUBCLAVIAN INNOMINATE UNI LEFT MOD SED; IR ANGIO VERTEBRAL SEL VERTEBRAL UNI RIGHT MOD SED COMPARISON:  MRI MRA of the brain of 02/20/2019. MEDICATIONS: Heparin 1000 units IV; no antibiotic was administered within 1 hour of the procedure. ANESTHESIA/SEDATION: Versed 1 mg IV; Fentanyl 50 mcg IV.  Apresoline  5 mg IV. Moderate Sedation Time:  37 minutes The patient was continuously monitored during the procedure by the interventional radiology nurse under my direct supervision. CONTRAST:   Isovue 300 approximately 60 mL. FLUOROSCOPY TIME:  Fluoroscopy Time: 15 minutes 18 seconds (745 mGy). COMPLICATIONS: None immediate. TECHNIQUE: Informed written consent was obtained from the patient after a thorough discussion of the procedural risks, benefits and alternatives. All questions were addressed. Maximal Sterile Barrier Technique was utilized including caps, mask, sterile gowns, sterile gloves, sterile drape, hand hygiene and skin antiseptic. A timeout was performed prior to the initiation of the procedure. The right groin was prepped and draped in the usual sterile fashion. Thereafter using modified Seldinger technique, transfemoral access into the right common femoral artery was obtained without difficulty. Over a 0.035 inch guidewire, a 5 French Pinnacle sheath was inserted. Through this, and also over 0.035 inch guidewire, a 5 [place] JB 1 catheter was advanced to the aortic arch region and selectively positioned in the right common carotid artery, , the right vertebral artery, the left common carotid artery and the left subclavian artery. FINDINGS: The right vertebral artery origin is widely patent. The vessel is seen to meander with moderate tortuosity to the right vertebrobasilar junction. Wide patency is seen in the right vertebrobasilar junction and the right posterior-inferior cerebellar artery. The basilar artery, the posterior cerebral arteries, the superior cerebellar arteries and the anterior- inferior cerebellar arteries opacify into the capillary and venous phases. There is approximately 50% stenosis of the left posterior cerebral artery in the P1 P2 junction secondary to intracranial arteriosclerosis. There is mild caliber irregularity of the mid basilar artery also probably related to intracranial arteriosclerosis. The right common carotid arteriogram demonstrates mild stenosis of the right external carotid artery. Its branches are normally opacified. The right internal carotid artery has  a smooth shallow plaque along the posterior wall without evidence of significant stenosis by the NASCET criteria. No evidence of ulcerations or of intraluminal filling defects noted either. The vessel opacifies to the cranial skull base there. The petrous, cavernous and the supraclinoid segments are widely patent. The right middle cerebral artery and the right anterior cerebral artery opacify into the capillary and venous phases. Mild caliber irregularity of the pericallosal A2 region suggests mild intracranial arteriosclerosis. At the right MCA bifurcation a lobulated prominent aneurysm is seen measuring approximately 8.1 mm x 7.7 mm. The left subclavian arteriogram demonstrates patency of the left vertebral artery origin associated with mild tortuosity.  More distally the vessel opacifies to the cranial skull base. Wide patency is seen of the left vertebrobasilar junction and the left posterior inferior cerebellar artery. The opacified portions of the basilar artery demonstrates wide patency with flow noted in the posterior cerebral arteries, superior cerebellar arteries and the anterior inferior cerebellar arteries. Mixing with unopacified blood is seen in the basilar artery from the contralateral right vertebral artery. The left common carotid arteriogram demonstrates the origin of the left external carotid artery and its major branches to be widely patent. The left internal carotid artery at the bulb demonstrates a minimal shallow plaque without ulcerations. The vessel is seen to opacify to the cranial skull base. The petrous, cavernous and supraclinoid segments are widely patent. The left middle cerebral artery and the left anterior cerebral artery opacify into the capillary and venous phases. There is mild caliber stenosis of the distal left anterior A1 segment. Also noted is mild caliber irregularity and narrowing of the left pericallosal artery in the A2 region. IMPRESSION: Approximately 8.1 mm x 7.7 mm  bilobed right MCA bifurcation aneurysm. Approximately 50% stenosis of the left posterior cerebral artery in the P1 P2 junction. 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 PLAN: Findings discussed with patient's referring stroke neurologist, and also the patient herself. Plan endovascular treatment of the right middle cerebral artery aneurysm following patient's rehab. Patient already on aspirin and Plavix. Electronically Signed   By: Julieanne Cotton M.D.   On: 02/22/2019 12:26    EKG: Independently reviewed.  NSR with rate 65; nonspecific ST changes with no evidence of acute ischemia  Assessment/Plan Principal Problem:   Right-sided lacunar infarction [facility]) Active Problems:   SAH (subarachnoid hemorrhage) (HCC)   Hypothyroidism (acquired)   Aneurysm of middle cerebral artery   Acute CVA - R lacunar infarct -Patient with acute onset of left-sided hemiparesis -Uncertain time of onset and h/o SAH and so not a candidate for TPA -MRI is positive as above for acute right lacunar infarct, notable for incidental aneurysm -Echo unremarkable -Risk stratification:       -FLP(unremarkable), A1c(5.5); TSH (3.02, T4 0.7; both borderline low/normal) -Continue aspirin 81 and Plavix daily for 21 days, thereafter Plavix alone per neurology -Neurology consulted -appreciate insight and recommendations -PT/OT/ST/Nutrition Consults -DC to CIR  Incidental 63mm R MCA bifurcation aneurysm -Neuro IR following -MRA today indicates right MCA 7.69mm x 1.8 mm aneurysm -will need outpatient follow-up for coiling -Continue dual anti-platelet therapy as above  HTN, reactive vs essential -Previously allowing for permissive hypertension -No history of HTN per patient - unclear if reactive 2/2 acute CVA as above -Blood pressure improving today, she does not currently take any blood pressure medications   HLD -Fasting lipid panel unremarkable -Continue Lipitor 40  mg daily  H/o SAH -Remote and associated with a fall -It should be ok to use DVT prevention-dose Lovenox in this patient  Hypothyroidism  -Check TSH and free T4 -Continue Synthroid at current dose for now  DVT prophylaxis:  Lovenox (on hold tonight due to Neuro IR procedure planned 4/23 am) Code Status: DNR Family Communication: None present Disposition Plan: DC to CIR Consults called: Neurology; CIR; PT/OT/ST/Nutrition  Admission status:  DC   Azucena Fallen MD [facility]  02/23/2019, 12:52 PM

## 2019-02-23 NOTE — Progress Notes (Signed)
Right groin evaluation for a pseudoaneurysm completed. preliminary results in Chart review CV Proc. [employer], RVS 02/23/2019, 4:06 PM

## 2019-02-23 NOTE — Progress Notes (Signed)
Patient arrived to unit and oriented. Dinner served, denies pain, spoke to daughter on the phone. Resting comfortably with call bell in place.

## 2019-02-23 NOTE — Progress Notes (Signed)
Physical Therapy Treatment Patient Details Name: Connie Oconnor MRN: [id number] DOB: 02-Jul-1941 Today's Date: 02/23/2019    History of Present Illness Connie Oconnor is a 78 y.o. female with past medical history of PVC, hypothyroidism, arthritis, migraines, h/o back surgery several years ago and has used walker/cane since, continued back pain.  Patient states that she went to bed at approximately 11:30 PM last night to which she was normal.  When patient awoke this morning she slid out of her bed secondary to left-sided weakness.  She continues to have left-sided weakness at this time.  Patient was brought to the hospital for further evaluation of possible stroke.  Patient states that she is allergic to aspirin and thus does not take it.  Her allergy is she feels shakiness.  She has never been on Plavix.  MRI shows R lacunar infarct.     PT Comments    Patient seen for mobility progression. Pt is pleasant and eager to participate in therapy. This session focused on gait training with use of SPC. Pt is making progress toward PT goals and tolerated session well. Continue to recommend CIR for further skilled PT services to maximize independence and safety with mobility.    Follow Up Recommendations  CIR     Equipment Recommendations  None recommended by PT    Recommendations for Other Services       Precautions / Restrictions Precautions Precautions: Fall Restrictions Weight Bearing Restrictions: No    Mobility  Bed Mobility               General bed mobility comments: pt OOB in chair upon arrival  Transfers Overall transfer level: Needs assistance Equipment used: Straight cane Transfers: Sit to/from Stand Sit to Stand: Min assist;+2 physical assistance         General transfer comment: cues for safe hand placement; hand over hand assist for use of L hand to push up; assistance to power up into standing   Ambulation/Gait Ambulation/Gait assistance: +2  safety/equipment;Min assist;Mod assist Gait Distance (Feet): (30 ft X 2 trials with seated rest break) Assistive device: Rolling walker (2 wheeled) Gait Pattern/deviations: Step-through pattern;Decreased step length - right;Decreased step length - left;Trunk flexed;Ataxic Gait velocity: decreased   General Gait Details: multimodal cues/assistance for upright posture and weight shifting; pt with improved ability to advance L LE and weight shift to offload L side with less cues with increased distance; L UE supported by therapist for improved posture   Stairs             Wheelchair Mobility    Modified Rankin (Stroke Patients Only) Modified Rankin (Stroke Patients Only) Pre-Morbid Rankin Score: Moderate disability Modified Rankin: Moderately severe disability     Balance Overall balance assessment: Needs assistance Sitting-balance support: Feet supported Sitting balance-Leahy Scale: Good     Standing balance support: Bilateral upper extremity supported;Single extremity supported;During functional activity Standing balance-Leahy Scale: Poor                              Cognition Arousal/Alertness: Awake/alert Behavior During Therapy: WFL for tasks assessed/performed Overall Cognitive Status: Impaired/Different from baseline Area of Impairment: Safety/judgement;Problem solving                         Safety/Judgement: Decreased awareness of deficits;Decreased awareness of safety   Problem Solving: Difficulty sequencing;Requires verbal cues        Exercises  General Comments        Pertinent Vitals/Pain Pain Assessment: Faces Faces Pain Scale: Hurts a little bit Pain Location: back (chronic) Pain Descriptors / Indicators: Sore Pain Intervention(s): Limited activity within patient's tolerance;Monitored during session;Repositioned    Home Living                      Prior Function            PT Goals (current goals can  now be found in the care plan section) Progress towards PT goals: Progressing toward goals    Frequency    Min 4X/week      PT Plan Current plan remains appropriate    Co-evaluation              AM-PAC PT "6 Clicks" Mobility   Outcome Measure  Help needed turning from your back to your side while in a flat bed without using bedrails?: A Little Help needed moving from lying on your back to sitting on the side of a flat bed without using bedrails?: A Little Help needed moving to and from a bed to a chair (including a wheelchair)?: A Little Help needed standing up from a chair using your arms (e.g., wheelchair or bedside chair)?: A Little Help needed to walk in hospital room?: A Lot Help needed climbing 3-5 steps with a railing? : A Lot 6 Click Score: 16    End of Session Equipment Utilized During Treatment: Gait belt Activity Tolerance: Patient tolerated treatment well Patient left: with call bell/phone within reach;in chair;with chair alarm set Nurse Communication: Mobility status PT Visit Diagnosis: Other abnormalities of gait and mobility (R26.89);Muscle weakness (generalized) (M62.81)     Time: [id number] PT Time Calculation (min) (ACUTE ONLY): 25 min  Charges:  $Gait Training: 23-37 mins                     Erline LevineKellyn Aison Malveaux, PTA Acute Rehabilitation Services Pager: [phone] Office: [phone]     Carolynne EdouardKellyn R Tamana Hatfield 02/23/2019, 2:46 PM

## 2019-02-23 NOTE — Progress Notes (Signed)
Inpatient Rehabilitation Admissions Coordinator  I have received approval from [facility] for an inpatient acute rehabilitation admission. Bed is available today. I met with patient at bedside and she is in agreement to admit. I have notified  Dr. Avon Gully by phone to arrange discharge to CIR today. I have notified RN CM. Kelli of plans to admit. I will make the arrangements to admit today. I have spoken with both daughter, Jenny Reichmann and Juliann Pulse and they are in agreement.  Danne Baxter, RN, MSN Rehab Admissions Coordinator [phone] 02/23/2019 10:44 AM

## 2019-02-23 NOTE — Progress Notes (Addendum)
Referring Physician(s): Dr Alease FrameW [place]  Supervising Physician: Julieanne Cottoneveshwar, Sanjeev  [facility]  Chief Complaint:  Cerebral arteriogram 4/23 R MCA aneurysm  Subjective:  Post cerebral small rt groin hematoma Pt complains of no pain unless palpate groin site She is sitting up in chair Ready for PT today   Allergies: Aspirin; Ibuprofen; and Penicillins  Medications: Prior to Admission medications   Medication Sig Start Date End Date Taking? Authorizing Provider  acetaminophen (TYLENOL) 500 MG tablet Take 1,000 mg by mouth every 6 (six) hours as needed for mild pain or headache.    Yes [provider]  cyclobenzaprine (FLEXERIL) 10 MG tablet Take 10 mg by mouth 3 (three) times daily as needed for muscle spasms.   Yes [provider]  diphenhydrAMINE (BENADRYL) 25 MG tablet Take 25 mg by mouth at bedtime as needed for allergies.    Yes [provider]  traMADol (ULTRAM) 50 MG tablet Take 50 mg by mouth every 8 (eight) hours as needed for moderate pain.   Yes [provider]  oxyCODONE-acetaminophen (PERCOCET/ROXICET) 5-325 MG tablet Take 1-2 tablets by mouth every 6 (six) hours as needed for severe pain. Patient not taking: Reported on 02/20/2019 01/11/18   Marshia LyBethune, James, PA-C     Vital Signs: BP 135/64 (BP Location: Right Arm)    Pulse 73    Temp 98.3 F (36.8 C) (Oral)    Resp 16    Ht 5\' 5"  (1.651 m)    Wt 155 lb (70.3 kg)    SpO2 98%    BMI 25.79 kg/m   Physical Exam Vitals signs reviewed.  Skin:    General: Skin is warm and dry.     Comments: Right groin site is clean and dry 2x2 cm small hematoma palpated at groin site Minimally tender to palpate No bruising No bleeding Skin is intact No sign of infection  Right foot 2+pulses  Neurological:     Mental Status: She is alert.     Imaging: Mr Shirlee LatchMra Head ZOWo Contrast  Result Date: 02/20/2019 CLINICAL DATA:  Stroke.  Left-sided weakness. EXAM: MRI HEAD  WITHOUT CONTRAST MRA HEAD WITHOUT CONTRAST MRA NECK WITHOUT CONTRAST TECHNIQUE: Multiplanar, multiecho pulse sequences of the brain and surrounding structures were obtained without intravenous contrast. Angiographic images of the Circle of Willis were obtained using MRA technique without intravenous contrast. Angiographic images of the neck were obtained using MRA technique without intravenous contrast. Carotid stenosis measurements (when applicable) are obtained utilizing NASCET criteria, using the distal internal carotid diameter as the denominator. COMPARISON:  CT head 03/29/2017 FINDINGS: MRI HEAD FINDINGS Brain: Small white matter infarct in the right corona radiata. No other acute infarct identified. Mild atrophy. Moderate to extensive chronic microvascular ischemic changes in the white matter and pons. Chronic infarcts in the basal ganglia and head of caudate on the right. Chronic microhemorrhage in the high right parietal lobe. No hematoma or fluid collection or mass lesion. No midline shift. Vascular: Normal arterial flow voids. Flow void right MCA bifurcation compatible with aneurysm. See MRA findings below. Skull and upper cervical spine: Negative Sinuses/Orbits: Mild mucosal edema paranasal sinuses.  Normal orbit Other: None MRA HEAD FINDINGS Severe stenosis distal left vertebral artery. Mild to moderate stenosis distal right vertebral artery and mild stenosis distal left vertebral artery. PICA patent bilaterally. Atherosclerotic basilar without significant stenosis. Superior cerebellar arteries widely patent bilaterally. Atherosclerotic disease with sequential moderate stenosis ease in the left PCA. Right PCA patent. Atherosclerotic  disease right cavernous carotid with mild stenosis. Left cavernous carotid widely patent. Moderate stenosis A2 segment bilaterally. Mild atherosclerotic disease left middle cerebral artery M1 and M2 segments. Mild disease in right MCA branches 7 mm aneurysm right MCA  bifurcation. MRA NECK FINDINGS Antegrade flow in the carotid arteries and vertebral arteries bilaterally. Right carotid bifurcation widely patent. Mild stenosis proximal left internal and external carotid arteries. Both vertebral arteries patent without proximal stenosis. 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 Atrophy and moderate to advanced chronic microvascular ischemia Negative for large vessel occlusion. Diffuse intracranial atherosclerotic disease as above. 7 mm aneurysm right MCA bifurcation Mild stenosis left carotid bifurcation in the neck. Electronically Signed   By: Marlan Palau M.D.   On: 02/20/2019 14:37   Mr Maxine Glenn Neck Wo Contrast  Result Date: 02/20/2019 CLINICAL DATA:  Stroke.  Left-sided weakness. EXAM: MRI HEAD WITHOUT CONTRAST MRA HEAD WITHOUT CONTRAST MRA NECK WITHOUT CONTRAST TECHNIQUE: Multiplanar, multiecho pulse sequences of the brain and surrounding structures were obtained without intravenous contrast. Angiographic images of the Circle of Willis were obtained using MRA technique without intravenous contrast. Angiographic images of the neck were obtained using MRA technique without intravenous contrast. Carotid stenosis measurements (when applicable) are obtained utilizing NASCET criteria, using the distal internal carotid diameter as the denominator. COMPARISON:  CT head 03/29/2017 FINDINGS: MRI HEAD FINDINGS Brain: Small white matter infarct in the right corona radiata. No other acute infarct identified. Mild atrophy. Moderate to extensive chronic microvascular ischemic changes in the white matter and pons. Chronic infarcts in the basal ganglia and head of caudate on the right. Chronic microhemorrhage in the high right parietal lobe. No hematoma or fluid collection or mass lesion. No midline shift. Vascular: Normal arterial flow voids. Flow void right MCA bifurcation compatible with aneurysm. See MRA findings below. Skull and upper  cervical spine: Negative Sinuses/Orbits: Mild mucosal edema paranasal sinuses.  Normal orbit Other: None MRA HEAD FINDINGS Severe stenosis distal left vertebral artery. Mild to moderate stenosis distal right vertebral artery and mild stenosis distal left vertebral artery. PICA patent bilaterally. Atherosclerotic basilar without significant stenosis. Superior cerebellar arteries widely patent bilaterally. Atherosclerotic disease with sequential moderate stenosis ease in the left PCA. Right PCA patent. Atherosclerotic disease right cavernous carotid with mild stenosis. Left cavernous carotid widely patent. Moderate stenosis A2 segment bilaterally. Mild atherosclerotic disease left middle cerebral artery M1 and M2 segments. Mild disease in right MCA branches 7 mm aneurysm right MCA bifurcation. MRA NECK FINDINGS Antegrade flow in the carotid arteries and vertebral arteries bilaterally. Right carotid bifurcation widely patent. Mild stenosis proximal left internal and external carotid arteries. Both vertebral arteries patent without proximal stenosis. 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 Atrophy and moderate to advanced chronic microvascular ischemia Negative for large vessel occlusion. Diffuse intracranial atherosclerotic disease as above. 7 mm aneurysm right MCA bifurcation Mild stenosis left carotid bifurcation in the neck. Electronically Signed   By: Marlan Palau M.D.   On: 02/20/2019 14:37   Mr Brain Wo Contrast  Result Date: 02/20/2019 CLINICAL DATA:  Stroke.  Left-sided weakness. EXAM: MRI HEAD WITHOUT CONTRAST MRA HEAD WITHOUT CONTRAST MRA NECK WITHOUT CONTRAST TECHNIQUE: Multiplanar, multiecho pulse sequences of the brain and surrounding structures were obtained without intravenous contrast. Angiographic images of the Circle of Willis were obtained using MRA technique without intravenous contrast. Angiographic images of the neck were obtained using MRA  technique without intravenous contrast. Carotid stenosis measurements (  when applicable) are obtained utilizing NASCET criteria, using the distal internal carotid diameter as the denominator. COMPARISON:  CT head 03/29/2017 FINDINGS: MRI HEAD FINDINGS Brain: Small white matter infarct in the right corona radiata. No other acute infarct identified. Mild atrophy. Moderate to extensive chronic microvascular ischemic changes in the white matter and pons. Chronic infarcts in the basal ganglia and head of caudate on the right. Chronic microhemorrhage in the high right parietal lobe. No hematoma or fluid collection or mass lesion. No midline shift. Vascular: Normal arterial flow voids. Flow void right MCA bifurcation compatible with aneurysm. See MRA findings below. Skull and upper cervical spine: Negative Sinuses/Orbits: Mild mucosal edema paranasal sinuses.  Normal orbit Other: None MRA HEAD FINDINGS Severe stenosis distal left vertebral artery. Mild to moderate stenosis distal right vertebral artery and mild stenosis distal left vertebral artery. PICA patent bilaterally. Atherosclerotic basilar without significant stenosis. Superior cerebellar arteries widely patent bilaterally. Atherosclerotic disease with sequential moderate stenosis ease in the left PCA. Right PCA patent. Atherosclerotic disease right cavernous carotid with mild stenosis. Left cavernous carotid widely patent. Moderate stenosis A2 segment bilaterally. Mild atherosclerotic disease left middle cerebral artery M1 and M2 segments. Mild disease in right MCA branches 7 mm aneurysm right MCA bifurcation. MRA NECK FINDINGS Antegrade flow in the carotid arteries and vertebral arteries bilaterally. Right carotid bifurcation widely patent. Mild stenosis proximal left internal and external carotid arteries. Both vertebral arteries patent without proximal stenosis. 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 Atrophy  and moderate to advanced chronic microvascular ischemia Negative for large vessel occlusion. Diffuse intracranial atherosclerotic disease as above. 7 mm aneurysm right MCA bifurcation Mild stenosis left carotid bifurcation in the neck. Electronically Signed   By: Marlan Palau M.D.   On: 02/20/2019 14:37    Labs:  CBC: Recent Labs    03/15/18 1833 02/20/19 1242 02/22/19 0356 02/23/19 0304  WBC 5.9 6.9 5.5 8.9  HGB 15.4* 14.1 14.0 13.3  HCT 46.9* 44.6 42.6 38.9  PLT 139* 291 279 276    COAGS: Recent Labs    02/20/19 1242  INR 1.0  APTT 28    BMP: Recent Labs    03/15/18 1833 02/20/19 1242 02/22/19 0356 02/23/19 0304  NA 137 141 141 138  K 4.1 3.8 3.6 3.9  CL 99* 104 106 106  CO2 25 25 24 22   GLUCOSE 113* 99 105* 113*  BUN 10 8 10 11   CALCIUM 9.5 9.4 9.1 9.4  CREATININE 0.92 0.89 0.82 0.71  GFRNONAA 59* >60 >60 >60  GFRAA >60 >60 >60 >60    LIVER FUNCTION TESTS: Recent Labs    03/15/18 1833 02/20/19 1242  BILITOT 1.0 0.5  AST 18 14*  ALT 17 15  ALKPHOS 69 54  PROT 7.7 6.3*  ALBUMIN 4.1 3.8    Assessment and Plan:  Small rt groin hematoma Discussed with Dr Corliss Skains Will get Vasc US groin to r/o pseudoaneurysm Ordered stat Will check later today or tomorrow Pt will hear from IR scheduler for follow up with Dr Corliss Skains in June  Electronically Signed: Robet Leu, PA-C 02/23/2019, 11:07 AM   I spent a total of 25 Minutes at the the patient's bedside AND on the patient's hospital floor or unit, greater than 50% of which was counseling/coordinating care for right groin hematoma

## 2019-02-23 NOTE — Progress Notes (Signed)
Occupational Therapy Treatment Patient Details Name: Connie MarusRebecca Camper MRN: [id number] DOB: 1940-11-04 Today's Date: 02/23/2019    History of present illness Connie Oconnor is a 78 y.o. female with past medical history of PVC, hypothyroidism, arthritis, migraines, h/o back surgery several years ago and has used walker/cane since, continued back pain.  Patient states that she went to bed at approximately 11:30 PM last night to which she was normal.  When patient awoke this morning she slid out of her bed secondary to left-sided weakness.  She continues to have left-sided weakness at this time.  Patient was brought to the hospital for further evaluation of possible stroke.  Patient states that she is allergic to aspirin and thus does not take it.  Her allergy is she feels shakiness.  She has never been on Plavix.  MRI shows R lacunar infarct.    OT comments  Pt progressing towards OT goals. Excited about transfer to CIR later today. Left inattention continues and Pt responds well to verbal and physical cues to attend for functional grooming at sink level (mod A overall for balance, cues and task completion) Pt mod A with RW for short in room mobility and transfers. Current POC remains appropriate and essential to maximize safety and independence in ADL and transfers.   Follow Up Recommendations  CIR    Equipment Recommendations  Other (comment)(defer to next venue)    Recommendations for Other Services Rehab consult    Precautions / Restrictions Precautions Precautions: Fall Restrictions Weight Bearing Restrictions: No       Mobility Bed Mobility               General bed mobility comments: pt OOB in chair upon arrival and at end of session  Transfers Overall transfer level: Needs assistance Equipment used: Straight cane Transfers: Sit to/from Stand Sit to Stand: Mod assist         General transfer comment: cues for safe hand placement; hand over hand assist for use of L  hand to push up; assistance to power up into standing     Balance Overall balance assessment: Needs assistance Sitting-balance support: Feet supported Sitting balance-Leahy Scale: Good     Standing balance support: Bilateral upper extremity supported;Single extremity supported;During functional activity Standing balance-Leahy Scale: Poor                             ADL either performed or assessed with clinical judgement   ADL Overall ADL's : Needs assistance/impaired     Grooming: Wash/dry hands;Wash/dry face;Oral care;Moderate assistance;Standing Grooming Details (indicate cue type and reason): assist for standing balance with chair behind for seated rest breaks, hand over hand to denture cleaning with LUE                 Toilet Transfer: Moderate assistance;Ambulation;RW Toilet Transfer Details (indicate cue type and reason): min A for boost; simulated through recliner         Functional mobility during ADLs: Moderate assistance;+2 for safety/equipment;Rolling walker General ADL Comments: fatigues     Vision       Perception     Praxis      Cognition Arousal/Alertness: Awake/alert Behavior During Therapy: WFL for tasks assessed/performed Overall Cognitive Status: Impaired/Different from baseline Area of Impairment: Safety/judgement;Problem solving                         Safety/Judgement: Decreased awareness of deficits;Decreased awareness of  safety   Problem Solving: Difficulty sequencing;Requires verbal cues          Exercises     Shoulder Instructions       General Comments L inattention continues with verbal and physical cues to attend to left side    Pertinent Vitals/ Pain       Pain Assessment: Faces Faces Pain Scale: Hurts a little bit Pain Location: back (chronic) Pain Descriptors / Indicators: Sore Pain Intervention(s): Limited activity within patient's tolerance;Monitored during session  Home Living                                           Prior Functioning/Environment              Frequency  Min 3X/week        Progress Toward Goals  OT Goals(current goals can now be found in the care plan section)  Progress towards OT goals: Progressing toward goals  Acute Rehab OT Goals Patient Stated Goal: to return to independence OT Goal Formulation: With patient Time For Goal Achievement: 03/07/19 Potential to Achieve Goals: Good  Plan Discharge plan remains appropriate    Co-evaluation                 AM-PAC OT "6 Clicks" Daily Activity     Outcome Measure   Help from another person eating meals?: A Little Help from another person taking care of personal grooming?: A Little Help from another person toileting, which includes using toliet, bedpan, or urinal?: A Lot Help from another person bathing (including washing, rinsing, drying)?: A Lot Help from another person to put on and taking off regular upper body clothing?: A Lot Help from another person to put on and taking off regular lower body clothing?: A Lot 6 Click Score: 14    End of Session Equipment Utilized During Treatment: Gait belt;Rolling walker  OT Visit Diagnosis: Unsteadiness on feet (R26.81);Muscle weakness (generalized) (M62.81);Hemiplegia and hemiparesis;Other symptoms and signs involving cognitive function Hemiplegia - Right/Left: Left Hemiplegia - dominant/non-dominant: Dominant Hemiplegia - caused by: Cerebral infarction   Activity Tolerance Patient tolerated treatment well   Patient Left in chair;with call bell/phone within reach;with chair alarm set   Nurse Communication Mobility status        Time: 0768-0881 OT Time Calculation (min): 27 min  Charges: OT General Charges $OT Visit: 1 Visit OT Treatments $Self Care/Home Management : 23-37 mins  Sherryl Manges OTR/L Acute Rehabilitation Services Pager: [phone] Office: [phone]   Evern Bio Daaron Dimarco 02/23/2019,  3:37 PM

## 2019-02-23 NOTE — Care Management Important Message (Signed)
Important Message  Patient Details  Name: Connie Oconnor MRN: [id number] Date of Birth: 1941/10/24   Medicare Important Message Given:  Yes    Connie Oconnor Stefan Church 02/23/2019, 2:05 PM

## 2019-02-23 NOTE — H&P (Signed)
Physical Medicine and Rehabilitation Admission H&P        Chief Complaint  Patient presents with  . Cerebrovascular Accident  chief complaint: left side weakness   HPI: Connie Oconnor is a 78 year old right-handed female with past medical history of right SAH from a fall May 2018, hypothyroidism, tobacco abuse, back surgery.per chart review patient lives alone. One level home with ramped entrance. Ambulated with a rolling walker since her back surgery. She does have local family check on her routinely. Presented on 02/20/2019 with acute left-sided weakness. Patient denied any fall or trauma. MRI the brain showed small acute infarct in the right corona radiata. MRA head was negative for large vessel occlusion.   MRA of the neck showed a 7 mm aneurysm right MCA bifurcation. Patient did not receive TPA. Echocardiogram with ejection fraction of 65% normal systolic function.no source of embolism. 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 Plan is for outpatient coiling of cerebral aneurysm per interventional radiology. Patient is tolerating a regular diet. Therapy evaluations completed and patient was admitted for a comprehensive rehabilitation program.   Review of Systems  Constitutional: Negative for chills and fever.  HENT: Negative for hearing loss.   Eyes: Negative for blurred vision and double vision.  Respiratory: Negative for cough and shortness of breath.   Cardiovascular: Negative for chest pain, palpitations and leg swelling.  Gastrointestinal: Positive for constipation. Negative for heartburn, nausea and vomiting.  Genitourinary: Negative for dysuria, flank pain and hematuria.  Musculoskeletal: Positive for back pain, joint pain and myalgias.  Skin: Negative for rash.  Neurological: Positive for focal weakness and headaches.   Psychiatric/Behavioral: The patient has insomnia.   All other systems reviewed and are negative.       Past Medical History:  Diagnosis Date  . Anemia      hx of  . Arthritis    . Dislocation of right knee with medial meniscus tear    . Headache      hx of migraines none since 1990's  . Hypothyroidism      1978, no problems since  . Pneumonia 1990's none since  . PVC (premature ventricular contraction)      199'0's none since  . SAH (subarachnoid hemorrhage) (HCC) 2016    On the right         Past Surgical History:  Procedure Laterality Date  . BACK SURGERY      . CERVICAL LAMINECTOMY   1986  . DILATION AND CURETTAGE OF UTERUS   1970  . ELBOW SURGERY Right 1993  . KNEE ARTHROSCOPY WITH SUBCHONDROPLASTY Right 01/11/2018    Procedure: KNEE ARTHROSCOPY WITH SUBCHONDROPLASTY, PARTIAL MEDIAL MENISCECTOMY, CHONDROPLASTY OF THE MEDIAL PATELLA FEMORAL CHONDYL, MEDIAL PLICA  EXCISION;  Surgeon: Jodi Geralds, MD;  Location: [facility];  Service: Orthopedics;  Laterality: Right;  . LUMBAR LAMINECTOMY   1993    rods placed bone generated removed one year later   . MULTIPLE TOOTH EXTRACTIONS      . SHOULDER SURGERY Left      arthritis clean out and shortened collar bone dr Renae Fickle  . TONSILLECTOMY   1970  . VAGINAL HYSTERECTOMY   1971    partial dr Dewaine Conger  . WISDOM TOOTH EXTRACTION             Family History  Problem Relation Age of Onset  .  Stroke Mother    . Heart attack Father      Social History:  reports that she has been smoking cigarettes. She has a 72.00 pack-year smoking history. She has never used smokeless tobacco. She reports that she does not drink alcohol or use drugs. Allergies:       Allergies  Allergen Reactions  . Aspirin Other (See Comments)      shaking  . Ibuprofen Other (See Comments)      Shaking  . Penicillins Rash and Other (See Comments)      Has patient had a PCN reaction causing immediate rash, facial/tongue/throat swelling, SOB or lightheadedness with  hypotension: No Has patient had a PCN reaction causing severe rash involving mucus membranes or skin necrosis: No Has patient had a PCN reaction that required hospitalization: Yes - was in hospital Has patient had a PCN reaction occurring within the last 10 years: No If all of the above answers are "NO", then may proceed with Cephalosporin use.            Medications Prior to Admission  Medication Sig Dispense Refill  . acetaminophen (TYLENOL) 500 MG tablet Take 1,000 mg by mouth every 6 (six) hours as needed for mild pain or headache.       . cyclobenzaprine (FLEXERIL) 10 MG tablet Take 10 mg by mouth 3 (three) times daily as needed for muscle spasms.      . diphenhydrAMINE (BENADRYL) 25 MG tablet Take 25 mg by mouth at bedtime as needed for allergies.       Marland Kitchen traMADol (ULTRAM) 50 MG tablet Take 50 mg by mouth every 8 (eight) hours as needed for moderate pain.      Marland Kitchen oxyCODONE-acetaminophen (PERCOCET/ROXICET) 5-325 MG tablet Take 1-2 tablets by mouth every 6 (six) hours as needed for severe pain. (Patient not taking: Reported on 02/20/2019) 40 tablet 0      Drug Regimen Review Drug regimen was reviewed and remains appropriate with no significant issues identified   Home: Home Living Family/patient expects to be discharged to:: Private residence Living Arrangements: Alone Available Help at Discharge: (daughters [place] and [place] to provide 24/7 supervision initia) Type of Home: House Home Access: Ramped entrance Home Layout: One level Bathroom Shower/Tub: Tub/shower unit, Engineer, building services: Handicapped height Bathroom Accessibility: Yes Home Equipment: Information systems manager, Environmental consultant - 2 wheels, Cane - single point, Wheelchair - manual  Lives With: Alone   Functional History: Prior Function Level of Independence: Independent with assistive device(s) Comments: ambulating with a RW   Functional Status:  Mobility: Bed Mobility Overal bed mobility: Needs Assistance Bed Mobility: Supine  to Sit, Sit to Supine Rolling: Supervision Sidelying to sit: Supervision Supine to sit: Supervision Sit to supine: Min assist General bed mobility comments: min A for return of LEs onto bed Transfers Overall transfer level: Needs assistance Equipment used: Rolling walker (2 wheeled) Transfers: Sit to/from Stand Sit to Stand: Min guard, Min assist, +2 safety/equipment, +2 physical assistance Stand pivot transfers: +2 physical assistance(min a x2 for functional for ambulatory transfers with RW) General transfer comment: pt initially able to transfer into standing from EOB with RW and cueing for safe hand placement; with fatigue pt becoming impulsive and requiring min A x2 for safety Ambulation/Gait Ambulation/Gait assistance: +2 safety/equipment, +2 physical assistance, Min assist Gait Distance (Feet): 75 Feet Assistive device: Rolling walker (2 wheeled) Gait Pattern/deviations: Step-through pattern, Decreased step length - right, Decreased step length - left, Decreased stride length, Decreased weight shift to left, Trunk  flexed General Gait Details: pt with modest instability with ambulation, use of RW with frequent cueing to maintain L hand on grip of RW; physical assistance needed to achieve L hand on grip and able to maintain for brief periods of time with cueing. Min A x2 for stability and safety with ambulation; cueing needed to maintain proximity to RW Gait velocity: decreased   ADL: ADL Overall ADL's : Needs assistance/impaired Eating/Feeding: Set up, Minimal assistance(for cutting. Using non dominant hand to self feed) Grooming: Oral care, Standing Grooming Details (indicate cue type and reason): Pt needed mod a to clean dentures due to poor LUE functional use. Pt also needed min a for standing at sink. Upper Body Bathing: Moderate assistance, Sitting Upper Body Bathing Details (indicate cue type and reason): close supervision for sitting balance and assist due to decreased LUE  function Lower Body Bathing: Sit to/from stand, Moderate assistance Upper Body Dressing : Moderate assistance Upper Body Dressing Details (indicate cue type and reason): due to sitting balance (pt with left bias with dynamic sitting) and decreased LUE function Lower Body Dressing: Maximal assistance Toilet Transfer: Minimal assistance, Stand-pivot Toilet Transfer Details (indicate cue type and reason): moderate assistance for ambulatory transfer using cane Toileting- Clothing Manipulation and Hygiene: Moderate assistance Functional mobility during ADLs: +2 for physical assistance(min a x2 with RW for assist with LUE for walker as well as balance) General ADL Comments: Pt initially required min guard x 1 for sit to stand.  As pt continued to work in therapy, required min a x2 for safe functional mobility   Cognition: Cognition Overall Cognitive Status: Impaired/Different from baseline Arousal/Alertness: Awake/alert Orientation Level: (P) Oriented X4 Attention: Focused, Sustained Focused Attention: Appears intact(Vigilance WNL: 1/1) Sustained Attention: Appears intact(Serial 7s: 3/3) Memory: Impaired Memory Impairment: Decreased recall of new information, Retrieval deficit(Immediate words: 4/5; Delayed: 0/5; with cues: 5/5) Awareness: Appears intact Problem Solving: Appears intact(4/4) Executive Function: Reasoning Reasoning: Impaired Reasoning Impairment: Verbal complex(Abstraction; 0/2) Cognition Arousal/Alertness: Awake/alert Behavior During Therapy: WFL for tasks assessed/performed Overall Cognitive Status: Impaired/Different from baseline Area of Impairment: Safety/judgement, Problem solving Safety/Judgement: Decreased awareness of deficits, Decreased awareness of safety Problem Solving: Difficulty sequencing, Requires verbal cues   Physical Exam: Blood pressure 133/64, pulse 76, temperature 98.5 F (36.9 C), temperature source Oral, resp. rate 16, height  (1.651 m),  weight 70.3 kg, SpO2 94 %. Physical Exam  Constitutional: She appears well-developed. No distress.  HENT:  Head: Normocephalic and atraumatic.  Eyes: Pupils are equal, round, and reactive to light. EOM are normal.  Neck: Normal range of motion. No tracheal deviation present. No thyromegaly present.  Cardiovascular: Normal rate. Exam reveals no friction rub.  No murmur heard. Respiratory: Effort normal. No respiratory distress. She has no wheezes.  GI: Soft. She exhibits no distension. There is no abdominal tenderness.  Musculoskeletal: Normal range of motion.     Comments: LB tender to palpation. Had difficulty performing SLR due to back pain, cramping Right hand, tender with ROM  Neurological: She is alert.  Patient sitting up in chair. Makes good eye contact with examiner. Left central 7.. Follows commands. Provides her name and age. Fair medical historian. Speech dysarthric. RUE 4+ to 5/5 prox to distal. LUE 2/5 deltoid, biceps, triceps, wrist, and hand. RLE and LLE grossly 2-/5  (pain) and 4/5 ADF/PF. No focal sensory findings. DTR's 1+.  Skin: Skin is warm. No rash noted. No erythema.  Psychiatric: She has a normal mood and affect. Her behavior is normal.  Lab Results Last 48 Hours        Results for orders placed or performed during the hospital encounter of 02/20/19 (from the past 48 hour(s))  CBC with Differential/Platelet     Status: None    Collection Time: 02/22/19  3:56 AM  Result Value Ref Range    WBC 5.5 4.0 - 10.5 K/uL    RBC 4.97 3.87 - 5.11 MIL/uL    Hemoglobin 14.0 12.0 - 15.0 g/dL    HCT 16.142.6 09.636.0 - 04.546.0 %    MCV 85.7 80.0 - 100.0 fL    MCH 28.2 26.0 - 34.0 pg    MCHC 32.9 30.0 - 36.0 g/dL    RDW 40.913.0 81.111.5 - 91.415.5 %    Platelets 279 150 - 400 K/uL    nRBC 0.0 0.0 - 0.2 %    Neutrophils Relative % 59 %    Neutro Abs 3.2 1.7 - 7.7 K/uL    Lymphocytes Relative 27 %    Lymphs Abs 1.5 0.7 - 4.0 K/uL    Monocytes Relative 10 %    Monocytes Absolute 0.6 0.1 -  1.0 K/uL    Eosinophils Relative 3 %    Eosinophils Absolute 0.2 0.0 - 0.5 K/uL    Basophils Relative 1 %    Basophils Absolute 0.0 0.0 - 0.1 K/uL    Immature Granulocytes 0 %    Abs Immature Granulocytes 0.01 0.00 - 0.07 K/uL      Comment: Performed at [facility], [address] [place], [place] [address]  Basic metabolic panel     Status: Abnormal    Collection Time: 02/22/19  3:56 AM  Result Value Ref Range    Sodium 141 135 - 145 mmol/L    Potassium 3.6 3.5 - 5.1 mmol/L    Chloride 106 98 - 111 mmol/L    CO2 24 22 - 32 mmol/L    Glucose, Bld 105 (H) 70 - 99 mg/dL    BUN 10 8 - 23 mg/dL    Creatinine, Ser 6.210.82 0.44 - 1.00 mg/dL    Calcium 9.1 8.9 - 30.810.3 mg/dL    GFR calc non Af Amer >60 >60 mL/min    GFR calc Af Amer >60 >60 mL/min    Anion gap 11 5 - 15      Comment: Performed at [facility], [address] [place], [place] [address]  CBC with Differential/Platelet     Status: None    Collection Time: 02/23/19  3:04 AM  Result Value Ref Range    WBC 8.9 4.0 - 10.5 K/uL    RBC 4.58 3.87 - 5.11 MIL/uL    Hemoglobin 13.3 12.0 - 15.0 g/dL    HCT 69.638.9 29.536.0 - 28.446.0 %    MCV 84.9 80.0 - 100.0 fL    MCH 29.0 26.0 - 34.0 pg    MCHC 34.2 30.0 - 36.0 g/dL    RDW 13.213.2 44.011.5 - 10.215.5 %    Platelets 276 150 - 400 K/uL    nRBC 0.0 0.0 - 0.2 %    Neutrophils Relative % 77 %    Neutro Abs 6.9 1.7 - 7.7 K/uL    Lymphocytes Relative 12 %    Lymphs Abs 1.1 0.7 - 4.0 K/uL    Monocytes Relative 9 %    Monocytes Absolute 0.8 0.1 - 1.0 K/uL    Eosinophils Relative 1 %    Eosinophils Absolute 0.0 0.0 - 0.5 K/uL  Basophils Relative 0 %    Basophils Absolute 0.0 0.0 - 0.1 K/uL    Immature Granulocytes 1 %    Abs Immature Granulocytes 0.04 0.00 - 0.07 K/uL      Comment: Performed at [facility], [address] [place], [place] [address]  Basic metabolic panel     Status: Abnormal    Collection Time: 02/23/19  3:04 AM  Result Value Ref Range    Sodium 138 135 - 145  mmol/L    Potassium 3.9 3.5 - 5.1 mmol/L    Chloride 106 98 - 111 mmol/L    CO2 22 22 - 32 mmol/L    Glucose, Bld 113 (H) 70 - 99 mg/dL    BUN 11 8 - 23 mg/dL    Creatinine, Ser 6.04 0.44 - 1.00 mg/dL    Calcium 9.4 8.9 - 54.0 mg/dL    GFR calc non Af Amer >60 >60 mL/min    GFR calc Af Amer >60 >60 mL/min    Anion gap 10 5 - 15      Comment: Performed at [facility], [address] [place], [place] [address]      Imaging Results (Last 48 hours)  No results found.           Medical Problem List and Plan: 1.  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               -admit to inpatient rehab             -Plan outpatient coiling of aneurysm per interventional radiology 2.  Antithrombotics: -DVT/anticoagulation:  SCDs.             -antiplatelet therapy: aspirin 81 mg daily Plavix 75 mg daily 3 weeks and Plavix alone 3. Pain Management/migraine headaches:  Tramadol as needed             -will schedule flexeril (home med) for right hand cramps/back             -add kpad for low back 4. Mood:  Provide emotional support             -antipsychotic agents: N/A 5. Neuropsych: This patient is capable of making decisions on her own behalf. 6. Skin/Wound Care:  Routine skin checks 7. Fluids/Electrolytes/Nutrition:  Routine in and outs with follow-up chemistries 8.  History of traumatic [facility] May 2018. Patient seen in the past by Dr. Conchita Paris 9. Hypertension. Norvasc 10 mg daily 10. Hyperlipidemia. Lipitor 11. Tobacco abuse. Counseling     Post Admission Physician Evaluation: 1. Functional deficits secondary  to right CR infarct. 2. Patient is admitted to receive collaborative, interdisciplinary care between the physiatrist, rehab nursing staff, and therapy team. 3. Patient's level of medical complexity and substantial therapy needs in context of that medical necessity cannot be  provided at a lesser intensity of care such as a SNF. 4. Patient has experienced substantial functional loss from his/her baseline which was documented above under the "Functional History" and "Functional Status" headings.  Judging by the patient's diagnosis, physical exam, and functional history, the patient has potential for functional progress which will result in measurable gains while on inpatient rehab.  These gains will be of substantial and practical use upon discharge  in facilitating mobility and self-care at the household level. 5. Physiatrist will provide 24 hour management of medical needs as well  as oversight of the therapy plan/treatment and provide guidance as appropriate regarding the interaction of the two. 6. The Preadmission Screening has been reviewed and patient status is unchanged unless otherwise stated above. 7. 24 hour rehab nursing will assist with bladder management, bowel management, safety, skin/wound care, disease management, medication administration, pain management and patient education  and help integrate therapy concepts, techniques,education, etc. 8. PT will assess and treat for/with: Lower extremity strength, range of motion, stamina, balance, functional mobility, safety, adaptive techniques and equipment, NMR, family ed.   Goals are: supervision. 9. OT will assess and treat for/with: ADL's, functional mobility, safety, upper extremity strength, adaptive techniques and equipment, NMR, family ed.   Goals are: supervision. Therapy may proceed with showering this patient. 10. SLP will assess and treat for/with: speech, communication, family ed, cognition.  Goals are: supervision. 11. Case Management and Social Worker will assess and treat for psychological issues and discharge planning. 12. Team conference will be held weekly to assess progress toward goals and to determine barriers to discharge. 13. Patient will receive at least 3 hours of therapy per day at least 5 days  per week. 14. ELOS: 10-14 days       15. Prognosis:  excellent   I have personally performed a face to face diagnostic evaluation of this patient and formulated the key components of the plan.  Additionally, I have personally reviewed laboratory data, imaging studies, as well as relevant notes and concur with the physician assistant's documentation above.  Ranelle Oyster, MD, FAAPMR     Mcarthur Rossetti Angiulli, PA-C 02/23/2019

## 2019-02-23 NOTE — Progress Notes (Signed)
Connie Staggers, MD  Physician  Physical Medicine and Rehabilitation  PMR Pre-admission  Signed  Date of Service:  02/22/2019 5:53 PM       Related encounter: ED to Hosp-Admission (Current) from 02/20/2019 in [facility]      Signed         Show:Clear all '[x]' Manual'[x]' Template'[x]' Copied  Added by: '[x]' Connie Gong, RN'[x]' Connie Staggers, MD  '[]' Hover for details PMR Admission Coordinator Pre-Admission Assessment  Patient: Connie Oconnor is an 78 y.o., female MRN: [id number] DOB: May 08, 1941 Height: '5\' 5"'  (165.1 cm) Weight: 70.3 kg  Insurance Information HMO: yes    PPO:      PCP:      IPA:      80/20:      OTHER: medicare advantage plan PRIMARY: [employer] care medicare      Policy#: [id number]      Subscriber: pt CM Name: Connie Oconnor      Phone#: [phone] ext [phone]     Fax#: [phone] Pre-Cert#: [id number]  Approved for 3 days with f/u Connie Oconnor    Employer:  Benefits:  Phone #: [phone]     Name: 02/22/2019 Eff. Date: 11/01/2018     Deduct: none      Out of Pocket Max: $3600      Life Max: none CIR: $295 co pay per day days 1 until 5      SNF: no co pay days 1 until 20; $160 co pay per day days 21 until 43; no co pay days 44 until 100 Outpatient: $30 co pay per visit     Co-Pay: visits per medical neccesity Home Health: 100%      Co-Pay: visits per medical neccesity DME: 80%     Co-Pay: 205 Providers: in network  SECONDARY: [employer]      Policy#: [id number]      Subscriber: pt Supplement plan Gap policy  Medicaid Application Date:       Case Manager:  Disability Application Date:       Case Worker:   The Data Collection Information Summary for patients in Inpatient Rehabilitation Facilities with attached Privacy Act [place] Records was provided and verbally reviewed with: Patient and Family  Emergency Contact Information         Contact Information    Name Relation Home Work Mobile    Oconnor,Connie Daughter [phone]   Connie Oconnor Daughter   [phone]      Current Medical History  Patient Admitting Diagnosis: right corono radiata CVA; R MCA aneurysm  History of Present Illness: Connie Oconnor is a 78 year old right-handed female with past medical history of right SAH from a fall May 2018, hypothyroidism, tobacco abuse, back surgery. Presented on 02/20/2019 with acute left-sided weakness. Patient denied any fall or trauma. MRI the brain showed small acute infarct in the right corona radiata. MRA head was negative for large vessel occlusion. MRA of the neck showed a 7 mm aneurysm right MCA bifurcation. Patient did not receive TPA. Echocardiogram with ejection fraction of 93% normal systolic function.no source of embolism. 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 Plan is for outpatient coiling of cerebral aneurysm per interventional radiology. Patient is tolerating a regular diet.   Complete NIHSS TOTAL: 8  Patient's medical record from [facility]  has been reviewed by the rehabilitation admission coordinator and  physician.  Past Medical History      Past Medical History:  Diagnosis Date   Anemia    hx of   Arthritis    Dislocation of right knee with medial meniscus tear    Headache    hx of migraines none since 1990's   Hypothyroidism    1978, no problems since   Pneumonia 1990's none since   PVC (premature ventricular contraction)    199'0's none since   [facility] (subarachnoid hemorrhage) ([place]) 2016   On the right    Family History   family history includes Heart attack in her father; Stroke in her mother.  Prior Rehab/Hospitalizations Has the patient had prior rehab or hospitalizations prior to admission? Yes  Has the patient had major surgery  during 100 days prior to admission? No              Current Medications  Current Facility-Administered Medications:    acetaminophen (TYLENOL) tablet 650 mg, 650 mg, Oral, Q4H PRN **OR** acetaminophen (TYLENOL) solution 650 mg, 650 mg, Per Tube, Q4H PRN **OR** acetaminophen (TYLENOL) suppository 650 mg, 650 mg, Rectal, Q4H PRN, Connie Bongo, MD   amLODipine (NORVASC) tablet 10 mg, 10 mg, Oral, Daily, Connie Hawking, MD, 10 mg at 02/23/19 1035   aspirin EC tablet 81 mg, 81 mg, Oral, Daily, Connie Hawking, MD, 81 mg at 02/23/19 1035   atorvastatin (LIPITOR) tablet 40 mg, 40 mg, Oral, q1800, Connie Bongo, MD, 40 mg at 02/22/19 1816   clopidogrel (PLAVIX) tablet 75 mg, 75 mg, Oral, Daily, Connie Hawking, MD, 75 mg at 02/23/19 1035   cyclobenzaprine (FLEXERIL) tablet 10 mg, 10 mg, Oral, TID PRN, Connie Bongo, MD, 10 mg at 02/21/19 1327   diphenhydrAMINE (BENADRYL) capsule 25 mg, 25 mg, Oral, QHS PRN, Connie Bongo, MD   feeding supplement (ENSURE ENLIVE) (ENSURE ENLIVE) liquid 237 mL, 237 mL, Oral, BID BM, Connie Ishikawa, MD, 237 mL at 02/23/19 1035   senna-docusate (Senokot-S) tablet 1 tablet, 1 tablet, Oral, QHS PRN, Connie Bongo, MD   traMADol Veatrice Bourbon) tablet 50 mg, 50 mg, Oral, Q8H PRN, Connie Bongo, MD, 50 mg at 02/22/19 1828  Patients Current Diet:     Diet Order                  Diet Heart Room service appropriate? Yes; Fluid consistency: Thin  Diet effective now               Precautions / Restrictions Precautions Precautions: Fall Restrictions Weight Bearing Restrictions: No   Has the patient had 2 or more falls or a fall with injury in the past year? No  Prior Activity Level Limited Community (1-2x/wk): Independent; drove; used cane or walker when needed; sedentary  Prior Functional Level Self Care: Did the patient need help bathing, dressing, using the toilet or eating? Independent  Indoor Mobility: Did the patient need assistance  with walking from room to room (with or without device)? Independent  Stairs: Did the patient need assistance with internal or external stairs (with or without device)? Independent  Functional Cognition: Did the patient need help planning regular tasks such as shopping or remembering to take medications? Independent  Home Assistive Devices / Equipment Home Assistive Devices/Equipment: Environmental consultant (specify type) Home Equipment: Shower seat, Environmental consultant - 2 wheels, Cane - single point, Wheelchair - manual  Prior Device Use: Indicate devices/aids used by the patient prior to current illness, exacerbation or injury? Walker  Current Functional Level Cognition  Arousal/Alertness: Awake/alert Overall  Cognitive Status: Impaired/Different from baseline Orientation Level: Oriented X4 Safety/Judgement: Decreased awareness of deficits, Decreased awareness of safety Attention: Focused, Sustained Focused Attention: Appears intact(Vigilance WNL: 1/1) Sustained Attention: Appears intact(Serial 7s: 3/3) Memory: Impaired Memory Impairment: Decreased recall of new information, Retrieval deficit(Immediate words: 4/5; Delayed: 0/5; with cues: 5/5) Awareness: Appears intact Problem Solving: Appears intact(4/4) Executive Function: Reasoning Reasoning: Impaired Reasoning Impairment: Verbal complex(Abstraction; 0/2)    Extremity Assessment (includes Sensation/Coordination)  Upper Extremity Assessment: Defer to OT evaluation LUE Deficits / Details: Pt with Brunnstrom level 3 in arm and 3-4 in left hand. Pt states she only had shoulder flexion in L arm to about 90* before and used her Right arm to assist her L arm for things like combing her hair and reaching up.  Pt with full elbow flexion/extenstion, supination/pronation, wrist flexion and extension and grasp and release however grasp and release is weak.  All movements in LUE are slowed and strength 2+/5.  Pt states "it doesn't work" however pt is able to hold  Oconnor objects, touch her nose with LUE with encouragement and cues. Sensation for Oconnor touch appears intact LUE Sensation: WNL(for Oconnor touch) LUE Coordination: decreased fine motor, decreased gross motor  Lower Extremity Assessment: LLE deficits/detail LLE Deficits / Details: pt with mild strength deficits compared to R LE; MMT revealed 3/5 for hip flexion, 5/5 for knee extension, 4/5 for knee flexion and 4/5 for ankle DF    ADLs  Overall ADL's : Needs assistance/impaired Eating/Feeding: Set up, Minimal assistance(for cutting. Using non dominant hand to self feed) Grooming: Oral care, Standing Grooming Details (indicate cue type and reason): Pt needed mod a to clean dentures due to poor LUE functional use. Pt also needed min a for standing at sink. Upper Body Bathing: Moderate assistance, Sitting Upper Body Bathing Details (indicate cue type and reason): close supervision for sitting balance and assist due to decreased LUE function Lower Body Bathing: Sit to/from stand, Moderate assistance Upper Body Dressing : Moderate assistance Upper Body Dressing Details (indicate cue type and reason): due to sitting balance (pt with left bias with dynamic sitting) and decreased LUE function Lower Body Dressing: Maximal assistance Toilet Transfer: Minimal assistance, Stand-pivot Toilet Transfer Details (indicate cue type and reason): moderate assistance for ambulatory transfer using cane Toileting- Clothing Manipulation and Hygiene: Moderate assistance Functional mobility during ADLs: +2 for physical assistance(min a x2 with RW for assist with LUE for walker as well as balance) General ADL Comments: Pt initially required min guard x 1 for sit to stand.  As pt continued to work in therapy, required min a x2 for safe functional mobility    Mobility  Overal bed mobility: Needs Assistance Bed Mobility: Supine to Sit, Sit to Supine Rolling: Supervision Sidelying to sit: Supervision Supine to sit:  Supervision Sit to supine: Min assist General bed mobility comments: min A for return of LEs onto bed    Transfers  Overall transfer level: Needs assistance Equipment used: Rolling walker (2 wheeled) Transfers: Sit to/from Stand Sit to Stand: Min guard, Min assist, +2 safety/equipment, +2 physical assistance Stand pivot transfers: +2 physical assistance(min a x2 for functional for ambulatory transfers with RW) General transfer comment: pt initially able to transfer into standing from EOB with RW and cueing for safe hand placement; with fatigue pt becoming impulsive and requiring min A x2 for safety    Ambulation / Gait / Stairs / Wheelchair Mobility  Ambulation/Gait Ambulation/Gait assistance: +2 safety/equipment, +2 physical assistance, Min assist Gait Distance (Feet): 75  Feet Assistive device: Rolling walker (2 wheeled) Gait Pattern/deviations: Step-through pattern, Decreased step length - right, Decreased step length - left, Decreased stride length, Decreased weight shift to left, Trunk flexed General Gait Details: pt with modest instability with ambulation, use of RW with frequent cueing to maintain L hand on grip of RW; physical assistance needed to achieve L hand on grip and able to maintain for brief periods of time with cueing. Min A x2 for stability and safety with ambulation; cueing needed to maintain proximity to RW Gait velocity: decreased    Posture / Balance Dynamic Sitting Balance Sitting balance - Comments: supervision due to impulsivity Balance Overall balance assessment: Needs assistance Sitting-balance support: Feet supported Sitting balance-Leahy Scale: Good Sitting balance - Comments: supervision due to impulsivity Postural control: Left lateral lean Standing balance support: Bilateral upper extremity supported Standing balance-Leahy Scale: Poor    Special needs/care consideration BiPAP/CPAP  N/a CPM  N/a Continuous Drip IV  N/a Dialysis n/a Life Vest   N/a Oxygen  N/a Special Bed  N/a Trach Size  N/a Wound Vac n/a Skin ecchymosis to BUE Bowel mgmt:  Continent LBM  4/21 Bladder mgmt: continent Diabetic mgmt: Hgb A1c 5.5 Behavioral consideration  N/a Chemo/radiation  N/a   Previous Home Environment  Living Arrangements: Alone  Lives With: Alone Available Help at Discharge: (daughters [place] and [place] to provide 24/7 supervision initia) Type of Home: House Home Layout: One level Home Access: Ramped entrance Bathroom Shower/Tub: Tub/shower unit, Architectural technologist: Handicapped height Bathroom Accessibility: Yes How Accessible: Accessible via walker Home Care Services: No  Discharge Living Setting Plans for Discharge Living Setting: Patient's home, Alone Type of Home at Discharge: House Discharge Home Layout: One level Discharge Home Access: [place] entrance Discharge Bathroom Shower/Tub: Tub/shower unit, Curtain Discharge Bathroom Toilet: Handicapped height Discharge Bathroom Accessibility: Yes How Accessible: Accessible via walker Does the patient have any problems obtaining your medications?: No  Social/Family/Support Systems Patient Roles: Parent Contact Information: Jenny Reichmann, daughter main contact Anticipated Caregiver: Jenny Reichmann and Juliann Pulse, daughters Anticipated Caregiver's Contact Information: see above Ability/Limitations of Caregiver: Juliann Pulse lives one hr away but can work remotely at [employer] home; Jenny Reichmann can assist Caregiver Availability: 24/7 Discharge Plan Discussed with Primary Caregiver: Yes Is Caregiver In Agreement with Plan?: Yes Does Caregiver/Family have Issues with Lodging/Transportation while Pt is in Rehab?: No  Vaughan Basta states she lives 10 miles from pt. Her sister Juliann Pulse lives an hour away but can work remotely from her [place] home. Linda cant work remotely but can assist with providing 24/7 supervision at d/c  Goals/Additional Needs Patient/Family Goal for Rehab: supervision PT, OT, and SLP Expected  length of stay: ELOS 10 to 14 days Pt/Family Agrees to Admission and willing to participate: Yes Program Orientation Provided & Reviewed with Pt/Caregiver Including Roles  & Responsibilities: Yes  Decrease burden of Care through IP rehab admission: n/a  Possible need for SNF placement upon discharge:  Not anticipated  Patient Condition: I have reviewed medical records from [facility] , spoken with CM, and patient and daughter. I met with patient at the bedside for inpatient rehabilitation assessment.  Patient will benefit from ongoing PT, OT and SLP, can actively participate in 3 hours of therapy a day 5 days of the week, and can make measurable gains during the admission.  Patient will also benefit from the coordinated team approach during an Inpatient Acute Rehabilitation admission.  The patient will receive intensive therapy as well as Rehabilitation physician, nursing, social worker, and care management  interventions.  Due to bladder management, bowel management, safety, skin/wound care, disease management, medication administration, pain management and patient education the patient requires 24 hour a day rehabilitation nursing.  The patient is currently min to mod assist with mobility and basic ADLs.  Discharge setting and therapy post discharge at home with home health is anticipated.  Patient has agreed to participate in the Acute Inpatient Rehabilitation Program and will admit today.  Preadmission Screen Completed By:  Cleatrice Burke RN MSN 02/23/2019 10:59 AM ______________________________________________________________________   Discussed status with Dr. Naaman Plummer on  02/23/2019 at  1058 and received approval for admission today.  Admission Coordinator:  Cleatrice Burke, RN MSN time  1058 Date  02/23/2019   Assessment/Plan: Diagnosis:Right CR infarct, Right MCA aneurysm 1. Does the need for close, 24 hr/day Medical supervision in concert with the patient's rehab  needs make it unreasonable for this patient to be served in a less intensive setting? Yes 2. Co-Morbidities requiring supervision/potential complications: HTN, active aneurysm 3. Due to bladder management, bowel management, safety, skin/wound care, disease management, medication administration, pain management and patient education, does the patient require 24 hr/day rehab nursing? Yes 4. Does the patient require coordinated care of a physician, rehab nurse, PT (1-2 hrs/day, 5 days/week), OT (1-2 hrs/day, 5 days/week) and SLP (1-2 hrs/day, 5 days/week) to address physical and functional deficits in the context of the above medical diagnosis(es)? Yes Addressing deficits in the following areas: balance, endurance, locomotion, strength, transferring, bowel/bladder control, bathing, dressing, feeding, grooming, toileting, cognition, speech and psychosocial support 5. Can the patient actively participate in an intensive therapy program of at least 3 hrs of therapy 5 days a week? Yes 6. The potential for patient to make measurable gains while on inpatient rehab is excellent 7. Anticipated functional outcomes upon discharge from inpatients are: supervision PT, supervision OT, supervision SLP 8. Estimated rehab length of stay to reach the above functional goals is: 10-14 days 9. Anticipated D/C setting: Home 10. Anticipated post D/C treatments: [facility] therapy 11. Overall Rehab/Functional Prognosis: excellent  MD Signature: Connie Staggers, MD, [place] [facility] 02/23/2019         Revision History

## 2019-02-24 ENCOUNTER — Inpatient Hospital Stay (HOSPITAL_COMMUNITY): Payer: Medicare Other | Admitting: Physical Therapy

## 2019-02-24 ENCOUNTER — Inpatient Hospital Stay (HOSPITAL_COMMUNITY): Payer: Medicare Other | Admitting: Speech Pathology

## 2019-02-24 ENCOUNTER — Inpatient Hospital Stay (HOSPITAL_COMMUNITY): Payer: Medicare Other

## 2019-02-24 DIAGNOSIS — G8114 Spastic hemiplegia affecting left nondominant side: Secondary | ICD-10-CM

## 2019-02-24 DIAGNOSIS — I639 Cerebral infarction, unspecified: Secondary | ICD-10-CM

## 2019-02-24 DIAGNOSIS — I69322 Dysarthria following cerebral infarction: Secondary | ICD-10-CM

## 2019-02-24 NOTE — Progress Notes (Addendum)
[facility] [facility] PHYSICAL MEDICINE & REHABILITATION PROGRESS NOTE   Subjective/Complaints:  Oriented to place and time, OT eval in progress  ROS- denies CP SOB N/V/D  Objective:   Vas [place] Lower Extremity Arterial Duplex  Result Date: 02/23/2019  ARTERIAL PSEUDOANEURYSM  Exam: Right groin History: S/p catheterization. Performing Technologist: Toma Deiters RVS  Examination Guidelines: A complete evaluation includes B-mode imaging, spectral Doppler, color Doppler, and power Doppler as needed of all accessible portions of each vessel. Bilateral testing is considered an integral part of a complete examination. Limited examinations for reoccurring indications may be performed as noted. +------------+----------+---------+------+----------+ Right DuplexPSV (cm/s)Waveform PlaqueComment(s) +------------+----------+---------+------+----------+ CFA            156    biphasic                  +------------+----------+---------+------+----------+ PFA            -139   biphasic                  +------------+----------+---------+------+----------+ Prox SFA              triphasic                 +------------+----------+---------+------+----------+ Right Vein comments:No evidence of a DVT or A-V fistula. There appears to be evidence of a hematoma at the level of the common femoral artery.  Summary: No evidence of a right pseudoaneurysm, AVF or DVT  Diagnosing physician: Sherald Hess MD Electronically signed by Sherald Hess MD on 02/23/2019 at 5:00:30 PM.   --------------------------------------------------------------------------------    Final    Ir Angio Intra Extracran Sel Com Carotid Innominate Bilat Mod Sed  Result Date: 02/23/2019 CLINICAL DATA:  Recent history of a right-sided ischemic stroke with left-sided weakness. 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 EXAM: BILATERAL COMMON CAROTID AND INNOMINATE ANGIOGRAPHY; IR ANGIO VERTEBRAL SEL SUBCLAVIAN INNOMINATE  UNI LEFT MOD SED; IR ANGIO VERTEBRAL SEL VERTEBRAL UNI RIGHT MOD SED COMPARISON:  MRI MRA of the brain of 02/20/2019. MEDICATIONS: Heparin 1000 units IV; no antibiotic was administered within 1 hour of the procedure. ANESTHESIA/SEDATION: Versed 1 mg IV; Fentanyl 50 mcg IV.  Apresoline  5 mg IV. Moderate Sedation Time:  37 minutes The patient was continuously monitored during the procedure by the interventional radiology nurse under my direct supervision. CONTRAST:  Isovue 300 approximately 60 mL. FLUOROSCOPY TIME:  Fluoroscopy Time: 15 minutes 18 seconds (745 mGy). COMPLICATIONS: None immediate. TECHNIQUE: Informed written consent was obtained from the patient after a thorough discussion of the procedural risks, benefits and alternatives. All questions were addressed. Maximal Sterile Barrier Technique was utilized including caps, mask, sterile gowns, sterile gloves, sterile drape, hand hygiene and skin antiseptic. A timeout was performed prior to the initiation of the procedure. The right groin was prepped and draped in the usual sterile fashion. Thereafter using modified Seldinger technique, transfemoral access into the right common femoral artery was obtained without difficulty. Over a 0.035 inch guidewire, a 5 French Pinnacle sheath was inserted. Through this, and also over 0.035 inch guidewire, a 5 [place] JB 1 catheter was advanced to the aortic arch region and selectively positioned in the right common carotid artery, , the right vertebral artery, the left common carotid artery and the left subclavian artery. FINDINGS: The right vertebral artery origin is widely patent. The vessel is seen to meander with moderate tortuosity to the right vertebrobasilar junction. Wide patency is seen in the right vertebrobasilar junction and the right posterior-inferior cerebellar artery. The  basilar artery, the posterior cerebral arteries, the superior cerebellar arteries and the anterior- inferior cerebellar arteries opacify  into the capillary and venous phases. There is approximately 50% stenosis of the left posterior cerebral artery in the P1 P2 junction secondary to intracranial arteriosclerosis. There is mild caliber irregularity of the mid basilar artery also probably related to intracranial arteriosclerosis. The right common carotid arteriogram demonstrates mild stenosis of the right external carotid artery. Its branches are normally opacified. The right internal carotid artery has a smooth shallow plaque along the posterior wall without evidence of significant stenosis by the NASCET criteria. No evidence of ulcerations or of intraluminal filling defects noted either. The vessel opacifies to the cranial skull base there. The petrous, cavernous and the supraclinoid segments are widely patent. The right middle cerebral artery and the right anterior cerebral artery opacify into the capillary and venous phases. Mild caliber irregularity of the pericallosal A2 region suggests mild intracranial arteriosclerosis. At the right MCA bifurcation a lobulated prominent aneurysm is seen measuring approximately 8.1 mm x 7.7 mm. The left subclavian arteriogram demonstrates patency of the left vertebral artery origin associated with mild tortuosity. More distally the vessel opacifies to the cranial skull base. Wide patency is seen of the left vertebrobasilar junction and the left posterior inferior cerebellar artery. The opacified portions of the basilar artery demonstrates wide patency with flow noted in the posterior cerebral arteries, superior cerebellar arteries and the anterior inferior cerebellar arteries. Mixing with unopacified blood is seen in the basilar artery from the contralateral right vertebral artery. The left common carotid arteriogram demonstrates the origin of the left external carotid artery and its major branches to be widely patent. The left internal carotid artery at the bulb demonstrates a minimal shallow plaque without  ulcerations. The vessel is seen to opacify to the cranial skull base. The petrous, cavernous and supraclinoid segments are widely patent. The left middle cerebral artery and the left anterior cerebral artery opacify into the capillary and venous phases. There is mild caliber stenosis of the distal left anterior A1 segment. Also noted is mild caliber irregularity and narrowing of the left pericallosal artery in the A2 region. IMPRESSION: Approximately 8.1 mm x 7.7 mm bilobed right MCA bifurcation aneurysm. Approximately 50% stenosis of the left posterior cerebral artery in the P1 P2 junction. 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 PLAN: Findings discussed with patient's referring stroke neurologist, and also the patient herself. Plan endovascular treatment of the right middle cerebral artery aneurysm following patient's rehab. Patient already on aspirin and Plavix. Electronically Signed   By: Julieanne Cotton M.D.   On: 02/22/2019 12:26   Ir Angio Vertebral Sel Subclavian Innominate Uni L Mod Sed  Result Date: 02/23/2019 CLINICAL DATA:  Recent history of a right-sided ischemic stroke with left-sided weakness. 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 EXAM: BILATERAL COMMON CAROTID AND INNOMINATE ANGIOGRAPHY; IR ANGIO VERTEBRAL SEL SUBCLAVIAN INNOMINATE UNI LEFT MOD SED; IR ANGIO VERTEBRAL SEL VERTEBRAL UNI RIGHT MOD SED COMPARISON:  MRI MRA of the brain of 02/20/2019. MEDICATIONS: Heparin 1000 units IV; no antibiotic was administered within 1 hour of the procedure. ANESTHESIA/SEDATION: Versed 1 mg IV; Fentanyl 50 mcg IV.  Apresoline  5 mg IV. Moderate Sedation Time:  37 minutes The patient was continuously monitored during the procedure by the interventional radiology nurse under my direct supervision. CONTRAST:  Isovue 300 approximately 60 mL. FLUOROSCOPY TIME:  Fluoroscopy Time: 15 minutes 18 seconds (745 mGy). COMPLICATIONS: None immediate.  TECHNIQUE:  Informed written consent was obtained from the patient after a thorough discussion of the procedural risks, benefits and alternatives. All questions were addressed. Maximal Sterile Barrier Technique was utilized including caps, mask, sterile gowns, sterile gloves, sterile drape, hand hygiene and skin antiseptic. A timeout was performed prior to the initiation of the procedure. The right groin was prepped and draped in the usual sterile fashion. Thereafter using modified Seldinger technique, transfemoral access into the right common femoral artery was obtained without difficulty. Over a 0.035 inch guidewire, a 5 French Pinnacle sheath was inserted. Through this, and also over 0.035 inch guidewire, a 5 [place] JB 1 catheter was advanced to the aortic arch region and selectively positioned in the right common carotid artery, , the right vertebral artery, the left common carotid artery and the left subclavian artery. FINDINGS: The right vertebral artery origin is widely patent. The vessel is seen to meander with moderate tortuosity to the right vertebrobasilar junction. Wide patency is seen in the right vertebrobasilar junction and the right posterior-inferior cerebellar artery. The basilar artery, the posterior cerebral arteries, the superior cerebellar arteries and the anterior- inferior cerebellar arteries opacify into the capillary and venous phases. There is approximately 50% stenosis of the left posterior cerebral artery in the P1 P2 junction secondary to intracranial arteriosclerosis. There is mild caliber irregularity of the mid basilar artery also probably related to intracranial arteriosclerosis. The right common carotid arteriogram demonstrates mild stenosis of the right external carotid artery. Its branches are normally opacified. The right internal carotid artery has a smooth shallow plaque along the posterior wall without evidence of significant stenosis by the NASCET criteria. No evidence of  ulcerations or of intraluminal filling defects noted either. The vessel opacifies to the cranial skull base there. The petrous, cavernous and the supraclinoid segments are widely patent. The right middle cerebral artery and the right anterior cerebral artery opacify into the capillary and venous phases. Mild caliber irregularity of the pericallosal A2 region suggests mild intracranial arteriosclerosis. At the right MCA bifurcation a lobulated prominent aneurysm is seen measuring approximately 8.1 mm x 7.7 mm. The left subclavian arteriogram demonstrates patency of the left vertebral artery origin associated with mild tortuosity. More distally the vessel opacifies to the cranial skull base. Wide patency is seen of the left vertebrobasilar junction and the left posterior inferior cerebellar artery. The opacified portions of the basilar artery demonstrates wide patency with flow noted in the posterior cerebral arteries, superior cerebellar arteries and the anterior inferior cerebellar arteries. Mixing with unopacified blood is seen in the basilar artery from the contralateral right vertebral artery. The left common carotid arteriogram demonstrates the origin of the left external carotid artery and its major branches to be widely patent. The left internal carotid artery at the bulb demonstrates a minimal shallow plaque without ulcerations. The vessel is seen to opacify to the cranial skull base. The petrous, cavernous and supraclinoid segments are widely patent. The left middle cerebral artery and the left anterior cerebral artery opacify into the capillary and venous phases. There is mild caliber stenosis of the distal left anterior A1 segment. Also noted is mild caliber irregularity and narrowing of the left pericallosal artery in the A2 region. IMPRESSION: Approximately 8.1 mm x 7.7 mm bilobed right MCA bifurcation aneurysm. Approximately 50% stenosis of the left posterior cerebral artery in the P1 P2 junction.  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 PLAN: Findings discussed with patient's referring stroke neurologist, and also the patient herself. Plan endovascular  treatment of the right middle cerebral artery aneurysm following patient's rehab. Patient already on aspirin and Plavix. Electronically Signed   By: Julieanne Cotton M.D.   On: 02/22/2019 12:26   Ir Angio Vertebral Sel Vertebral Uni R Mod Sed  Result Date: 02/23/2019 CLINICAL DATA:  Recent history of a right-sided ischemic stroke with left-sided weakness. 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 EXAM: BILATERAL COMMON CAROTID AND INNOMINATE ANGIOGRAPHY; IR ANGIO VERTEBRAL SEL SUBCLAVIAN INNOMINATE UNI LEFT MOD SED; IR ANGIO VERTEBRAL SEL VERTEBRAL UNI RIGHT MOD SED COMPARISON:  MRI MRA of the brain of 02/20/2019. MEDICATIONS: Heparin 1000 units IV; no antibiotic was administered within 1 hour of the procedure. ANESTHESIA/SEDATION: Versed 1 mg IV; Fentanyl 50 mcg IV.  Apresoline  5 mg IV. Moderate Sedation Time:  37 minutes The patient was continuously monitored during the procedure by the interventional radiology nurse under my direct supervision. CONTRAST:  Isovue 300 approximately 60 mL. FLUOROSCOPY TIME:  Fluoroscopy Time: 15 minutes 18 seconds (745 mGy). COMPLICATIONS: None immediate. TECHNIQUE: Informed written consent was obtained from the patient after a thorough discussion of the procedural risks, benefits and alternatives. All questions were addressed. Maximal Sterile Barrier Technique was utilized including caps, mask, sterile gowns, sterile gloves, sterile drape, hand hygiene and skin antiseptic. A timeout was performed prior to the initiation of the procedure. The right groin was prepped and draped in the usual sterile fashion. Thereafter using modified Seldinger technique, transfemoral access into the right common femoral artery was obtained without difficulty. Over a 0.035  inch guidewire, a 5 French Pinnacle sheath was inserted. Through this, and also over 0.035 inch guidewire, a 5 [place] JB 1 catheter was advanced to the aortic arch region and selectively positioned in the right common carotid artery, , the right vertebral artery, the left common carotid artery and the left subclavian artery. FINDINGS: The right vertebral artery origin is widely patent. The vessel is seen to meander with moderate tortuosity to the right vertebrobasilar junction. Wide patency is seen in the right vertebrobasilar junction and the right posterior-inferior cerebellar artery. The basilar artery, the posterior cerebral arteries, the superior cerebellar arteries and the anterior- inferior cerebellar arteries opacify into the capillary and venous phases. There is approximately 50% stenosis of the left posterior cerebral artery in the P1 P2 junction secondary to intracranial arteriosclerosis. There is mild caliber irregularity of the mid basilar artery also probably related to intracranial arteriosclerosis. The right common carotid arteriogram demonstrates mild stenosis of the right external carotid artery. Its branches are normally opacified. The right internal carotid artery has a smooth shallow plaque along the posterior wall without evidence of significant stenosis by the NASCET criteria. No evidence of ulcerations or of intraluminal filling defects noted either. The vessel opacifies to the cranial skull base there. The petrous, cavernous and the supraclinoid segments are widely patent. The right middle cerebral artery and the right anterior cerebral artery opacify into the capillary and venous phases. Mild caliber irregularity of the pericallosal A2 region suggests mild intracranial arteriosclerosis. At the right MCA bifurcation a lobulated prominent aneurysm is seen measuring approximately 8.1 mm x 7.7 mm. The left subclavian arteriogram demonstrates patency of the left vertebral artery origin  associated with mild tortuosity. More distally the vessel opacifies to the cranial skull base. Wide patency is seen of the left vertebrobasilar junction and the left posterior inferior cerebellar artery. The opacified portions of the basilar artery demonstrates wide patency with flow noted in the posterior cerebral arteries, superior cerebellar arteries  and the anterior inferior cerebellar arteries. Mixing with unopacified blood is seen in the basilar artery from the contralateral right vertebral artery. The left common carotid arteriogram demonstrates the origin of the left external carotid artery and its major branches to be widely patent. The left internal carotid artery at the bulb demonstrates a minimal shallow plaque without ulcerations. The vessel is seen to opacify to the cranial skull base. The petrous, cavernous and supraclinoid segments are widely patent. The left middle cerebral artery and the left anterior cerebral artery opacify into the capillary and venous phases. There is mild caliber stenosis of the distal left anterior A1 segment. Also noted is mild caliber irregularity and narrowing of the left pericallosal artery in the A2 region. IMPRESSION: Approximately 8.1 mm x 7.7 mm bilobed right MCA bifurcation aneurysm. Approximately 50% stenosis of the left posterior cerebral artery in the P1 P2 junction. 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 PLAN: Findings discussed with patient's referring stroke neurologist, and also the patient herself. Plan endovascular treatment of the right middle cerebral artery aneurysm following patient's rehab. Patient already on aspirin and Plavix. Electronically Signed   By: Julieanne Cotton M.D.   On: 02/22/2019 12:26   Recent Labs    02/22/19 0356 02/23/19 0304  WBC 5.5 8.9  HGB 14.0 13.3  HCT 42.6 38.9  PLT 279 276   Recent Labs    02/22/19 0356 02/23/19 0304  NA 141 138  K 3.6 3.9  CL 106 106   CO2 24 22  GLUCOSE 105* 113*  BUN 10 11  CREATININE 0.82 0.71  CALCIUM 9.1 9.4   No intake or output data in the 24 hours ending 02/24/19 0553   Physical Exam: Vital Signs Blood pressure (!) 142/55, pulse 78, temperature 98.1 F (36.7 C), temperature source Oral, resp. rate 15, height  (1.651 m), SpO2 95 %.   General: No acute distress Mood and affect are appropriate Heart: Regular rate and rhythm no rubs murmurs or extra sounds Lungs: Clear to auscultation, breathing unlabored, no rales or wheezes Abdomen: Positive bowel sounds, soft nontender to palpation, nondistended Extremities: No clubbing, cyanosis, or edema Skin: No evidence of breakdown, no evidence of rash Neurologic: Cranial nerves II through XII intact, motor strength is 5/5 in R and 3- Left  deltoid, bicep, tricep, grip, hip flexor, knee extensors, ankle dorsiflexor and plantar flexor Sensory exam normal sensation to light touch and proprioception in bilateral upper and lower extremities Cerebellar exam normal finger to nose to finger as well as heel to shin in bilateral upper and lower extremities Musculoskeletal: Full range of motion in all 4 extremities. No joint swelling   Assessment/Plan: 1. Functional deficits secondary to Right corona radiata infarct which require 3+ hours per day of interdisciplinary therapy in a comprehensive inpatient rehab setting.  Physiatrist is providing close team supervision and 24 hour management of active medical problems listed below.  Physiatrist and rehab team continue to assess barriers to discharge/monitor patient progress toward functional and medical goals  Care Tool:  Bathing              Bathing assist       Upper Body Dressing/Undressing Upper body dressing        Upper body assist      Lower Body Dressing/Undressing Lower body dressing            Lower body assist       Toileting Toileting    Toileting assist Assist  for toileting: (not  attempted)     Transfers Chair/bed transfer  Transfers assist           Locomotion Ambulation   Ambulation assist              Walk 10 feet activity   Assist           Walk 50 feet activity   Assist           Walk 150 feet activity   Assist           Walk 10 feet on uneven surface  activity   Assist           Wheelchair     Assist               Wheelchair 50 feet with 2 turns activity    Assist            Wheelchair 150 feet activity     Assist          Medical Problem List and Plan: 1.Left side weaknesssecondary to 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 -CIR evals today -Plan outpatient coiling of aneurysm per interventional radiology 2. Antithrombotics: -DVT/anticoagulation:SCDs. -antiplatelet therapy: aspirin 81 mg daily Plavix 75 mg daily 3 weeks and Plavix alone 3. Pain Management/migraine headaches:Tramadolasneeded -will schedule flexeril (home med) for right hand cramps/back -add kpad for low back 4. Mood:Provide emotional support -antipsychotic agents: N/A 5. Neuropsych: This patientiscapable of making decisions on herown behalf. 6. Skin/Wound Care:Routine skin checks 7. Fluids/Electrolytes/Nutrition:Routine in and outs with follow-up chemistries [facility]. [facility] Patient seen in the past by Dr. Conchita ParisNundkumar 9. Hypertension. Norvasc 10 mg daily Vitals:   02/23/19 1944 02/24/19 0509  BP: (!) 142/53 (!) 142/55  Pulse: 87 78  Resp: 15   Temp: 98.4 F (36.9 C) 98.1 F (36.7 C)  SpO2: 99% 95%  Controlled 4/25 10. Hyperlipidemia. Lipitor 11. Tobacco abuse. Counseling    LOS: 1 days A FACE TO FACE EVALUATION WAS PERFORMED  Erick Colacendrew E Kirsteins 02/24/2019, 5:53 AM

## 2019-02-24 NOTE — Evaluation (Signed)
Physical Therapy Assessment and Plan  Patient Details  Name: Connie Oconnor MRN: [id number] Date of Birth: February 23, 1941  PT Diagnosis: Abnormal posture, Abnormality of gait, Coordination disorder, Difficulty walking, Hemiplegia non-dominant, Impaired sensation, Low back pain and Muscle weakness Rehab Potential: Good ELOS: 2-3 weeks    Today's Date: 02/24/2019 PT Individual Time: 1102-1204 PT Individual Time Calculation (min): 62 min    Problem List:  Patient Active Problem List   Diagnosis Date Noted  . Lacunar infarct, acute ([place]) 02/23/2019  . 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 Right-sided lacunar infarction ([place]) 02/20/2019  . Hypothyroidism (acquired) 02/20/2019  . Closed fracture of medial portion of right tibial plateau 01/11/2018  . Acute medial meniscal tear, right, initial encounter 01/11/2018  . SAH (subarachnoid hemorrhage) ([place]) 03/29/2017    Past Medical History:  Past Medical History:  Diagnosis Date  . Anemia    hx of  . Arthritis   . Dislocation of right knee with medial meniscus tear   . Headache    hx of migraines none since 1990's  . Hypothyroidism    1978, Oconnor problems since  . Pneumonia 1990's none since  . PVC (premature ventricular contraction)    199'0's none since  . SAH (subarachnoid hemorrhage) ([place]) 2016   On the right   Past Surgical History:  Past Surgical History:  Procedure Laterality Date  . BACK SURGERY    . CERVICAL LAMINECTOMY  1986  . DILATION AND CURETTAGE OF UTERUS  1970  . ELBOW SURGERY Right 1993  . IR ANGIO INTRA EXTRACRAN SEL COM CAROTID INNOMINATE BILAT MOD SED  02/22/2019  . IR ANGIO VERTEBRAL SEL SUBCLAVIAN INNOMINATE UNI L MOD SED  02/22/2019  . IR ANGIO VERTEBRAL SEL VERTEBRAL UNI R MOD SED  02/22/2019  . KNEE ARTHROSCOPY WITH SUBCHONDROPLASTY Right 01/11/2018   Procedure: KNEE ARTHROSCOPY WITH SUBCHONDROPLASTY, PARTIAL MEDIAL MENISCECTOMY, CHONDROPLASTY OF THE MEDIAL PATELLA FEMORAL CHONDYL, MEDIAL PLICA   EXCISION;  Surgeon: Dorna Leitz, MD;  Location: [facility];  Service: Orthopedics;  Laterality: Right;  . LUMBAR LAMINECTOMY  1993   rods placed bone generated removed one year later   . MULTIPLE TOOTH EXTRACTIONS    . SHOULDER SURGERY Left    arthritis clean out and shortened collar bone dr Eddie Dibbles  . TONSILLECTOMY  1970  . VAGINAL HYSTERECTOMY  1971   partial dr Mallie Mussel  . WISDOM TOOTH EXTRACTION      Assessment & Plan Clinical Impression: Connie Oconnor is a 78 year old right-handed female with past medical history of right SAH from a fall May 2018, hypothyroidism, tobacco abuse, back surgery.per chart review patient lives alone. One level home with ramped entrance. Ambulated with a rolling walker since her back surgery. She does have local family check on her routinely. Presented on 02/20/2019 with acute left-sided weakness. Patient denied any fall or trauma. MRI the brain showed small acute infarct in the right corona radiata. MRA head was negative for large vessel occlusion. MRA of the neck showed a 7 mm aneurysm right MCA bifurcation. Patient did not receive TPA. Echocardiogram with ejection fraction of 75% normal systolic function.Oconnor source of embolism. 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 Plan is for outpatient coiling of cerebral aneurysm per interventional radiology. Patient is tolerating a regular diet. Therapy evaluations completed and patient was admitted for a comprehensive rehabilitation program.  Patient transferred to CIR  on 02/23/2019 .   Patient currently requires mod with mobility secondary to muscle weakness, decreased cardiorespiratoy endurance, impaired timing and sequencing, motor apraxia, decreased coordination and decreased motor planning, decreased midline orientation, decreased attention to left and decreased motor planning,  decreased initiation, decreased problem solving, decreased safety awareness, decreased memory and delayed processing and decreased sitting balance, decreased standing balance, decreased postural control, hemiplegia and decreased balance strategies.  Prior to hospitalization, patient was modified independent  with mobility and lived with Alone in a House home.  Home access is  Ramped entrance.  Patient will benefit from skilled PT intervention to maximize safe functional mobility, minimize fall risk and decrease caregiver burden for planned discharge home with 24 hour supervision.  Anticipate patient will benefit from follow up [place] at discharge.  PT - End of Session Activity Tolerance: Tolerates 30+ min activity without fatigue Endurance Deficit: Yes PT Assessment Rehab Potential (ACUTE/IP ONLY): Good PT Patient demonstrates impairments in the following area(s): Balance;Pain;Perception;Safety;Endurance;Motor;Sensory PT Transfers Functional Problem(s): Bed Mobility;Bed to Chair;Car;Furniture;Floor PT Locomotion Functional Problem(s): Ambulation;Wheelchair Mobility;Stairs PT Plan PT Intensity: Minimum of 1-2 x/day ,45 to 90 minutes PT Frequency: 5 out of 7 days PT Duration Estimated Length of Stay: 2-3 weeks  PT Treatment/Interventions: Ambulation/gait training;Cognitive remediation/compensation;Discharge planning;DME/adaptive equipment instruction;Functional mobility training;Pain management;Psychosocial support;Splinting/orthotics;Therapeutic Activities;Visual/perceptual remediation/compensation;UE/LE Strength taining/ROM;Balance/vestibular training;Community reintegration;Disease management/prevention;Functional electrical stimulation;Neuromuscular re-education;Patient/family education;Skin care/wound management;Stair training;Therapeutic Exercise;UE/LE Coordination activities;Wheelchair propulsion/positioning PT Transfers Anticipated Outcome(s): S, LRAD  PT Locomotion Anticipated Outcome(s): S,  LRAD  PT Recommendation Follow Up Recommendations: Home health PT;24 hour supervision/assistance Patient destination: Home Equipment Recommended: Rolling walker with 5" wheels;3 in 1 bedside comode;To be determined  Skilled Therapeutic Intervention Patient received in bed, lethargic and requiring heavy tactile and verbal cues to wake, then participated in PLOF interview/rapport building with therapist but continued falling asleep during this conversation. Able to perform rolling to the R with Max but to the L with min guard, required ModA for all other bed mobility throughout session. Initially required [place] for sitting balance but this faded to MinA with O[place]nor UE support/min guard if holding onto rail of stedy or bed. Attempted sit to stand and squat pivot transfers but patient limited by fatigue and back pain. Used stedy for sit to stand with assist fluctuating between Min-ModA and with extended time for initiation, then performed dependent transfer from bed to chair with stedy for safety due to fatigue/pain today. Able to perform sit to stand at [place] with [place] in hallway, unable to perform gait due to fatigue. Continued to fall asleep while talking to therapist in [facility]. She was returned to her room and transferred back to bed with stedy, left in bed in care of nurse tech and all other needs met. Nurse tech educated on appropriate use of stedy and allowing patient extra time to initiate.   PT Evaluation Precautions/Restrictions Precautions Precautions: Fall Precaution Comments: L hemi, back pain, needs time to initiate movements  Restrictions Weight Bearing Restrictions: Oconnor General Chart Reviewed: Yes Response to Previous Treatment: Patient reporting fatigue but able to participate. Family/Caregiver Present: Oconnor Vital Signs Pain Pain Assessment Pain Scale: Faces Pain Score: 0-Oconnor pain Faces Pain Scale: Hurts even more Pain Type: Chronic pain Pain Location: Back Pain Orientation:  Lower;Medial Pain Descriptors / Indicators: Aching;Discomfort Pain Onset: With Activity Patients Stated Pain Goal: 0 Pain Intervention(s): Repositioned;Ambulation/increased activity Multiple Pain Sites: O[place]nor Home Living/Prior Functioning Home Living Available Help at Discharge: Other (Comment)(per admissions coordinator note, daughters can provide 24/7 assist initially ) Type of Home:  House Home Access: Ramped entrance Home Layout: One level Bathroom Shower/Tub: Tub/shower unit;Curtain Biochemist, clinical: Handicapped height Bathroom Accessibility: Yes  Lives With: Alone Prior Function Level of Independence: Independent with basic ADLs;Independent with homemaking with ambulation;Independent with transfers;Independent with gait;Requires assistive device for independence  Able to Take Stairs?: (N/A ) Driving: O[place]nor Vocation: Retired Leisure: Hobbies-yes (Comment)(reading ) Comments: ambulating with a RW Vision/Perception  Perception Perception: Impaired(L inattention ) Praxis Praxis: Impaired Praxis Impairment Details: Motor planning  Cognition Overall Cognitive Status: Impaired/Different from baseline Arousal/Alertness: Awake/alert Orientation Level: Oriented X4 Attention: Focused;Sustained Focused Attention: Appears intact Sustained Attention: Appears intact Memory: Impaired Memory Impairment: Decreased recall of new information;Retrieval deficit Awareness: Appears intact Problem Solving: Appears intact Safety/Judgment: Impaired Sensation Sensation Light Touch: Impaired by gross assessment Hot/Cold: Impaired by gross assessment Proprioception: Impaired by gross assessment Stereognosis: Impaired by gross assessment Additional Comments: patient often makes guesses even when PT is not touching her or performing a test  Coordination Gross Motor Movements are Fluid and Coordinated: O[place]nor Fine Motor Movements are Fluid and Coordinated: Oconnor Coordination and Movement Description:  limited by weakness  Motor  Motor Motor: Hemiplegia Motor - Skilled Clinical Observations: L UE > L LE   Mobility Bed Mobility Bed Mobility: Rolling Right;Rolling Left;Sit to Supine;Supine to Sit Rolling Right: Maximal Assistance - Patient 25-49% Rolling Left: Contact Guard/Touching assist Supine to Sit: Moderate Assistance - Patient 50-74% Sit to Supine: Moderate Assistance - Patient 50-74% Transfers Transfers: Sit to Stand;Stand to Sit;Stand Pivot Transfers Sit to Stand: Moderate Assistance - Patient 50-74% Stand to Sit: Moderate Assistance - Patient 50-74% Stand Pivot Transfers: Dependent - mechanical lift(stedy ) Stand Pivot Transfer Details: Verbal cues for precautions/safety;Verbal cues for safe use of DME/AE;Verbal cues for sequencing;Verbal cues for technique Transfer (Assistive device): Other (Comment)(stedy ) Locomotion  Gait Ambulation: Oconnor Gait Gait: Oconnor Stairs / Additional Locomotion Stairs: Oconnor Wheelchair Mobility Wheelchair Mobility: Yes Wheelchair Assistance: Chartered loss adjuster: Right upper extremity;Right lower extremity Wheelchair Parts Management: Needs assistance Distance: 5f   Trunk/Postural Assessment  Cervical Assessment Cervical Assessment: Exceptions to WFL(forward head ) Thoracic Assessment Thoracic Assessment: Exceptions to WFL(kyphotic, rounded shoulders ) Lumbar Assessment Lumbar Assessment: Exceptions to WFL(posterior pelvic tilt ) Postural Control Postural Control: Deficits on evaluation(delayed/insufficient )  Balance Balance Balance Assessed: Yes Static Sitting Balance Static Sitting - Balance Support: Right upper extremity supported;Feet supported Static Sitting - Level of Assistance: 4: Min assist Dynamic Sitting Balance Dynamic Sitting - Balance Support: Right upper extremity supported;Feet supported Dynamic Sitting - Level of Assistance: 3: Mod assist Static Standing Balance Static Standing - Balance  Support: Right upper extremity supported;During functional activity Static Standing - Level of Assistance: 3: Mod assist Dynamic Standing Balance Dynamic Standing - Balance Support: Right upper extremity supported;During functional activity Dynamic Standing - Level of Assistance: 2: Max assist Extremity Assessment  RUE Assessment RUE Assessment: Within Functional Limits LUE Assessment LUE Assessment: Not tested General Strength Comments: gross hemiplegia and L neglect  RLE Assessment RLE Assessment: Within Functional Limits LLE Assessment LLE Assessment: Ex[facility] to [facility] St[facility] Comments: quad 4-/5, ankle dorsiflexion 4/5, hip ABD seated 3-/5, hip flexion 3/5     Refer to Care Plan for Long Term Goals  Recommendations for other services: None   Discharge Criteria: Patient will be discharged from PT if patient refuses treatment 3 consecutive times without medical reason, if treatment goals not met, if there is a change in medical status, if patient makes Oconnor progress towards goals or if patient is discharged from  hospital.  The above assessment, treatment plan, treatment alternatives and goals were discussed and mutually agreed upon: by patient  Deniece Ree PT, DPT, CBIS  Supplemental Physical Therapist [facility]    Pager [phone] Acute Rehab Office [phone]

## 2019-02-24 NOTE — Evaluation (Signed)
Speech Language Pathology Assessment and Plan  Patient Details  Name: Connie Oconnor MRN: [id number] Date of Birth: 01-26-1941  SLP Diagnosis: Cognitive Impairments;Speech and Language deficits  Rehab Potential: Excellent ELOS: 2-3 weeks    Today's Date: 02/24/2019 SLP Individual Time: 0900-1000 SLP Individual Time Calculation (min): 60 min   Problem List:  Patient Active Problem List   Diagnosis Date Noted  . Lacunar infarct, acute ([place]) 02/23/2019  . 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 Right-sided lacunar infarction ([place]) 02/20/2019  . Hypothyroidism (acquired) 02/20/2019  . Closed fracture of medial portion of right tibial plateau 01/11/2018  . Acute medial meniscal tear, right, initial encounter 01/11/2018  . SAH (subarachnoid hemorrhage) ([place]) 03/29/2017   Past Medical History:  Past Medical History:  Diagnosis Date  . Anemia    hx of  . Arthritis   . Dislocation of right knee with medial meniscus tear   . Headache    hx of migraines none since 1990's  . Hypothyroidism    1978, no problems since  . Pneumonia 1990's none since  . PVC (premature ventricular contraction)    199'0's none since  . SAH (subarachnoid hemorrhage) ([place]) 2016   On the right   Past Surgical History:  Past Surgical History:  Procedure Laterality Date  . BACK SURGERY    . CERVICAL LAMINECTOMY  1986  . DILATION AND CURETTAGE OF UTERUS  1970  . ELBOW SURGERY Right 1993  . IR ANGIO INTRA EXTRACRAN SEL COM CAROTID INNOMINATE BILAT MOD SED  02/22/2019  . IR ANGIO VERTEBRAL SEL SUBCLAVIAN INNOMINATE UNI L MOD SED  02/22/2019  . IR ANGIO VERTEBRAL SEL VERTEBRAL UNI R MOD SED  02/22/2019  . KNEE ARTHROSCOPY WITH SUBCHONDROPLASTY Right 01/11/2018   Procedure: KNEE ARTHROSCOPY WITH SUBCHONDROPLASTY, PARTIAL MEDIAL MENISCECTOMY, CHONDROPLASTY OF THE MEDIAL PATELLA FEMORAL CHONDYL, MEDIAL PLICA  EXCISION;  Surgeon: Dorna Leitz, MD;  Location: [facility];  Service: Orthopedics;  Laterality:  Right;  . LUMBAR LAMINECTOMY  1993   rods placed bone generated removed one year later   . MULTIPLE TOOTH EXTRACTIONS    . SHOULDER SURGERY Left    arthritis clean out and shortened collar bone dr Eddie Dibbles  . TONSILLECTOMY  1970  . VAGINAL HYSTERECTOMY  1971   partial dr Mallie Mussel  . WISDOM TOOTH EXTRACTION      Assessment / Plan / Recommendation Clinical Impression Patient is a 78 year old right-handed female with past medical history of right SAH from a fall May 2018, hypothyroidism, tobacco abuse, back surgery.per chart review patient lives alone. One level home with ramped entrance. Ambulated with a rolling walker since her back surgery. She does have local family check on her routinely. Presented on 02/20/2019 with acute left-sided weakness. Patient denied any fall or trauma. MRI the brain showed small acute infarct in the right corona radiata. MRA head was negative for large vessel occlusion. MRA of the neck showed a 7 mm aneurysm right MCA bifurcation. Patient did not receive TPA. Echocardiogram with ejection fraction of 03% normal systolic function.no source of embolism. 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 Plan is for outpatient coiling of cerebral aneurysm per interventional radiology. Patient is tolerating a regular diet. Therapy evaluations completed and patient was admitted for a comprehensive rehabilitation program 02/23/19.  Patient administered the Cognistat and demonstrates mild impairments in short-term recall and moderate impairments in attention  and problem solving which impacts her safety with functional and familiar tasks. Patient reported and SLP noted mild higher-level word-finding deficits at the conversation level during the informal assessment. Patient also demonstrates a mild oral-motor weakness but was 100% intelligible. Patient  would benefit from skilled SLP intervention to maximize her cognitive-linguistic function prior to discharge.    Skilled Therapeutic Interventions          Administered a cognitive-linguistic evaluation, please see above for details. Upon entering the patient's room, patient reported she had spilled her coffee on herself because she fell asleep with it in her hand. Patient required extra time and Mod A verbal cues for problem solving throughout task of standing to change sheets. Patient completed the Cognistat and required Mod-Max verbal cues for arousal throughout task due to fatigue. Educated patient in regards to current cognitive-linguistic deficits and goals of skilled SLP intervention, she verbalized understanding.      SLP Assessment  Patient will need skilled [place] Pathology Services during CIR admission    Recommendations  Oral Care Recommendations: Oral care BID Recommendations for Other Services: Neuropsych consult Patient destination: Home Follow up Recommendations: 24 hour supervision/assistance;Home Health SLP Equipment Recommended: None recommended by SLP    SLP Frequency 3 to 5 out of 7 days   SLP Duration  SLP Intensity  SLP Treatment/Interventions 2-3 weeks  Minumum of 1-2 x/day, 30 to 90 minutes  Cognitive remediation/compensation;Environmental controls;Internal/external aids;Speech/Language facilitation;Therapeutic Activities;Patient/family education;Functional tasks;Cueing hierarchy    Pain Pain Assessment Pain Scale: Faces Faces Pain Scale: Hurts even more Pain Type: Chronic pain Pain Location: Back Pain Orientation: Lower;Medial Pain Descriptors / Indicators: Aching;Discomfort Pain Onset: With Activity Patients Stated Pain Goal: 0 Pain Intervention(s): Repositioned;Ambulation/increased activity Multiple Pain Sites: No  Prior Functioning Type of Home: House  Lives With: Alone Available Help at Discharge: Other (Comment)(per admissions  coordinator note, daughters can provide 24/7 assist initially ) Vocation: Retired  Industrial/product designer Term Goals: Week 1: SLP Short Term Goal 1 (Week 1): Patient will demonstrate functional problem solving for mildly complex tasks with Mod A verbal cues.  SLP Short Term Goal 2 (Week 1): Patient will demonstrate sustained attention to functional tasks for 15 minutes with Min A verbal cues for redirection.  SLP Short Term Goal 3 (Week 1): Patient will recall new, daily information with Min A multimodal cues.  SLP Short Term Goal 4 (Week 1): Patient will utilize word-finding strategies at the conversation level with Mod I.   Refer to Care Plan for Long Term Goals  Recommendations for other services: Neuropsych  Discharge Criteria: Patient will be discharged from SLP if patient refuses treatment 3 consecutive times without medical reason, if treatment goals not met, if there is a change in medical status, if patient makes no progress towards goals or if patient is discharged from hospital.  The above assessment, treatment plan, treatment alternatives and goals were discussed and mutually agreed upon: by patient  Kamuela Magos 02/24/2019, 2:36 PM

## 2019-02-24 NOTE — Plan of Care (Signed)
  Problem: RH SAFETY Goal: RH STG ADHERE TO SAFETY PRECAUTIONS W/ASSISTANCE/DEVICE Description STG Adhere to Safety Precautions With Assistance/Device. Min  Outcome: Progressing   Problem: RH BOWEL ELIMINATION Goal: RH STG MANAGE BOWEL W/MEDICATION W/ASSISTANCE Description STG Manage Bowel with Medication with Assistance. min  Outcome: Not Progressing   Problem: RH BLADDER ELIMINATION Goal: RH STG MANAGE BLADDER WITH ASSISTANCE Description STG Manage Bladder With Assistance Min  Outcome: Not Progressing

## 2019-02-24 NOTE — Evaluation (Signed)
Occupational Therapy Assessment and Plan  Patient Details  Name: Connie Oconnor MRN: [id number] Date of Birth: 1941-11-01  OT Diagnosis: abnormal posture, cognitive deficits, hemiplegia affecting dominant side, lumbago (low back pain) and muscle weakness (generalized) Rehab Potential:   ELOS: 14-18 days   Today's Date: 02/24/2019 OT Individual Time: 4259-5638 OT Individual Time Calculation (min): 75 min     Problem List:  Patient Active Problem List   Diagnosis Date Noted  . Lacunar infarct, acute ([place]) 02/23/2019  . 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 Right-sided lacunar infarction ([place]) 02/20/2019  . Hypothyroidism (acquired) 02/20/2019  . Closed fracture of medial portion of right tibial plateau 01/11/2018  . Acute medial meniscal tear, right, initial encounter 01/11/2018  . SAH (subarachnoid hemorrhage) ([place]) 03/29/2017    Past Medical History:  Past Medical History:  Diagnosis Date  . Anemia    hx of  . Arthritis   . Dislocation of right knee with medial meniscus tear   . Headache    hx of migraines none since 1990's  . Hypothyroidism    1978, no problems since  . Pneumonia 1990's none since  . PVC (premature ventricular contraction)    199'0's none since  . SAH (subarachnoid hemorrhage) ([place]) 2016   On the right   Past Surgical History:  Past Surgical History:  Procedure Laterality Date  . BACK SURGERY    . CERVICAL LAMINECTOMY  1986  . DILATION AND CURETTAGE OF UTERUS  1970  . ELBOW SURGERY Right 1993  . IR ANGIO INTRA EXTRACRAN SEL COM CAROTID INNOMINATE BILAT MOD SED  02/22/2019  . IR ANGIO VERTEBRAL SEL SUBCLAVIAN INNOMINATE UNI L MOD SED  02/22/2019  . IR ANGIO VERTEBRAL SEL VERTEBRAL UNI R MOD SED  02/22/2019  . KNEE ARTHROSCOPY WITH SUBCHONDROPLASTY Right 01/11/2018   Procedure: KNEE ARTHROSCOPY WITH SUBCHONDROPLASTY, PARTIAL MEDIAL MENISCECTOMY, CHONDROPLASTY OF THE MEDIAL PATELLA FEMORAL CHONDYL, MEDIAL PLICA  EXCISION;  Surgeon: Dorna Leitz, MD;  Location: [facility];  Service: Orthopedics;  Laterality: Right;  . LUMBAR LAMINECTOMY  1993   rods placed bone generated removed one year later   . MULTIPLE TOOTH EXTRACTIONS    . SHOULDER SURGERY Left    arthritis clean out and shortened collar bone dr Eddie Dibbles  . TONSILLECTOMY  1970  . VAGINAL HYSTERECTOMY  1971   partial dr Mallie Mussel  . WISDOM TOOTH EXTRACTION      Assessment & Plan Clinical Impression: Connie Oconnor is a 78 year old right-handed female with past medical history of right SAH from a fall May 2018, hypothyroidism, tobacco abuse, back surgery.per chart review patient lives alone. One level home with ramped entrance. Ambulated with a rolling walker since her back surgery. She does have local family check on her routinely. Presented on 02/20/2019 with acute left-sided weakness. Patient denied any fall or trauma. MRI the brain showed small acute infarct in the right corona radiata. MRA head was negative for large vessel occlusion. MRA of the neck showed a 7 mm aneurysm right MCA bifurcation. Patient did not receive TPA. Echocardiogram with ejection fraction of 75% normal systolic function.no source of embolism. 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 Plan is for outpatient coiling of cerebral aneurysm per interventional radiology. Patient is tolerating a regular diet. Therapy evaluations completed and patient was admitted for a comprehensive rehabilitation program.     Patient currently requires  mod with basic self-care skills secondary to muscle weakness, decreased cardiorespiratoy endurance, impaired timing and sequencing, abnormal tone, unbalanced muscle activation, motor apraxia, decreased coordination and decreased motor planning, decreased midline orientation and decreased attention to left, decreased attention, decreased  awareness, decreased problem solving, decreased safety awareness, decreased memory and delayed processing and decreased sitting balance, decreased standing balance, decreased postural control, hemiplegia and decreased balance strategies.  Prior to hospitalization, patient could complete BADL/IADL with modified independent .  Patient will benefit from skilled intervention to decrease level of assist with basic self-care skills and increase independence with basic self-care skills prior to discharge home with care partner.  Anticipate patient will require 24 hour supervision and follow up home health.  OT - End of Session Activity Tolerance: Tolerates 10 - 20 min activity with multiple rests Endurance Deficit: Yes OT Assessment Rehab Potential (ACUTE ONLY): Good OT Barriers to Discharge: Decreased caregiver support;Lack of/limited family support OT Barriers to Discharge Comments: pt will needs 24/7 S at d/c, unsure if family can provide OT Patient demonstrates impairments in the following area(s): Balance;Cognition;Endurance;Motor;Perception;Safety;Sensory OT Basic ADL's Functional Problem(s): Grooming;Bathing;Dressing;Toileting OT Transfers Functional Problem(s): Toilet;Tub/Shower OT Additional Impairment(s): Fuctional Use of Upper Extremity OT Plan OT Intensity: Minimum of 1-2 x/day, 45 to 90 minutes OT Frequency: 5 out of 7 days OT Duration/Estimated Length of Stay: 14-18 days OT Treatment/Interventions: Balance/vestibular training;Community reintegration;Disease mangement/prevention;Neuromuscular re-education;Functional electrical stimulation;Patient/family education;Self Care/advanced ADL retraining;Wheelchair propulsion/positioning;UE/LE Coordination activities;Therapeutic Exercise;Splinting/orthotics;Cognitive remediation/compensation;Discharge planning;DME/adaptive equipment instruction;Functional mobility training;Pain management;Psychosocial support;Skin care/wound managment;Therapeutic  Activities;UE/LE Strength taining/ROM;Visual/perceptual remediation/compensation OT Self Feeding Anticipated Outcome(s): S OT Basic Self-Care Anticipated Outcome(s): S OT Toileting Anticipated Outcome(s): S OT Bathroom Transfers Anticipated Outcome(s): S OT Recommendation Patient destination: Home Follow Up Recommendations: Home health OT Equipment Recommended: 3 in 1 bedside comode;Tub/shower seat;Tub/shower bench;To be determined   Skilled Therapeutic Intervention 1:1. Pt received in bed. Supine>sitting EOM with MOD A for trunk elevation. MOD A sitting balance at EOB d/t anterior L lean. Pt demo weak trunk strength as well as decreased proprioception of L side body. Pt completes stand pivot transfer with no AD with MOD A.  MOD A- UB dressing, pt self initiates threading LUE first, however require A to fully pul sleeve over elbow and thread head MIN A UB bathing- LUE NMR for HOH A to wash RUE LB dressing- A to wash B feet and buttocks, min A sitting balance LB dressing- total A, pt able to problem solve different LE position, however unable to thread BLE Footwear- dependent  Toileting- max A for clothing mangament. Toilet transfer- MOD A with grab bar.  Pt transfers back to bed with MIN A overall with increased time to initiate transfer. Exited session with pt set up with break fast tray, call light in reach, exit alarm on and all needs met.    OT Evaluation Precautions/Restrictions  Precautions Precautions: Fall Restrictions Weight Bearing Restrictions: No General Chart Reviewed: Yes Family/Caregiver Present: No Vital Signs Therapy Vitals Temp: 98.1 F (36.7 C) Temp Source: Oral Pulse Rate: 78 BP: (!) 142/55 Patient Position (if appropriate): Lying Oxygen Therapy SpO2: 95 % O2 Device: Room Air Pain Pain Assessment Pain Score: 0-No pain Home Living/Prior Functioning Home Living Family/patient expects to be discharged to:: Private residence Living Arrangements:  Alone Type of Home: House Home Access: Ramped entrance Home Layout: One level Bathroom Shower/Tub: Tub/shower unit, Architectural technologist: Handicapped height Bathroom Accessibility: Yes  Lives With: Alone Prior Function Level of Independence: Independent with basic ADLs, Independent with homemaking with ambulation Vocation:  Retired Comments: ambulating with a RW ADL   Vision Baseline Vision/History: No visual deficits Patient Visual Report: No change from baseline Vision Assessment?: No apparent visual deficits Perception  Perception: Impaired(L inattention) Praxis Praxis: Impaired Praxis Impairment Details: Motor planning Cognition Overall Cognitive Status: Impaired/Different from baseline Arousal/Alertness: Awake/alert Orientation Level: Person;Place;Situation Person: Oriented Place: Oriented Situation: Oriented Year: 2020 Month: April Day of Week: Incorrect Memory: Impaired Memory Impairment: Decreased recall of new information;Retrieval deficit Immediate Memory Recall: Sock;Blue;Bed Memory Recall: Blue;Bed Memory Recall Blue: Without Cue Memory Recall Bed: Without Cue Attention: Focused;Sustained Sensation Sensation Light Touch: Impaired by gross assessment Proprioception: Impaired by gross assessment Coordination Gross Motor Movements are Fluid and Coordinated: No Fine Motor Movements are Fluid and Coordinated: No Motor    Mobility  Transfers Sit to Stand: Moderate Assistance - Patient 50-74% Stand to Sit: Moderate Assistance - Patient 50-74%  Trunk/Postural Assessment  Cervical Assessment Cervical Assessment: Exceptions to WFL(head forward) Thoracic Assessment Thoracic Assessment: Exceptions to WFL(rounded shoulders) Lumbar Assessment Lumbar Assessment: Exceptions to WFL(posterior pelvic tilt) Postural Control Postural Control: Deficits on evaluation(delayed/insufficient)  Balance Balance Balance Assessed: Yes Dynamic Sitting Balance Dynamic  Sitting - Level of Assistance: 4: Min assist;3: Mod assist Sitting balance - Comments: min-mod A d/t anterior lean sitting EOB Static Standing Balance Static Standing - Level of Assistance: 3: Mod assist Extremity/Trunk Assessment RUE Assessment RUE Assessment: Within Functional Limits LUE Assessment LUE Assessment: Exceptions to [facility] General Strength Comments: L inattention, 2/5 elbow flexion and digit flexion     Refer to Care Plan for Long Term Goals  Recommendations for other services: Therapeutic Recreation  Pet therapy and Stress management   Discharge Criteria: Patient will be discharged from OT if patient refuses treatment 3 consecutive times without medical reason, if treatment goals not met, if there is a change in medical status, if patient makes no progress towards goals or if patient is discharged from hospital.  The above assessment, treatment plan, treatment alternatives and goals were discussed and mutually agreed upon: by patient  Tonny Branch 02/24/2019, 7:40 AM

## 2019-02-24 NOTE — Progress Notes (Signed)
Initial Nutrition Assessment   RD working remotely.   DOCUMENTATION CODES:   Not applicable  INTERVENTION:   Continue Ensure Enlive po BID, each supplement provides 350 kcal and 20 grams of protein   NUTRITION DIAGNOSIS:   Increased nutrient needs related to acute illness as evidenced by estimated needs.  GOAL:   Patient will meet greater than or equal to 90% of their needs  MONITOR:   PO intake, Supplement acceptance, Labs, Weight trends, Skin  REASON FOR ASSESSMENT:   Malnutrition Screening Tool    ASSESSMENT:   78 y.o. female with history of PVC, hypothyroidism, arthritis, migraines presenting with L sided weakness to [place] on 4/21. MRI small R CR infarct, 75mm R MCA aneurysm. Admitted to [facility] on 4/24.  Recorded po intake 50% at breakfast this AM. Limited documentation of po intake while Inpatient but appears pt eating 50% with reported good appetite.  Drinking Ensure Enlive as well  Weight of 70.3 kg on 4/21; no weight since. Recommend obtaining new weight. Noted weight down from 1 year ago; pt weighed 75.3 kg in May 2019. 6.6% wt loss in 1 year   Labs: reviewed Meds: reviewed   NUTRITION - FOCUSED PHYSICAL EXAM:  Unable to perform, working remotely  Diet Order:   Diet Order            Diet Heart Room service appropriate? Yes; Fluid consistency: Thin  Diet effective now              EDUCATION NEEDS:   Not appropriate for education at this time  Skin:  Skin Assessment: Reviewed RN Assessment  Last BM:  4/21  Height:   Ht Readings from Last 1 Encounters:  02/23/19 5\' 5"  (1.651 m)    Weight:   Wt Readings from Last 1 Encounters:  02/20/19 70.3 kg    BMI:  Body mass index is 25.79 kg/m.  Estimated Nutritional Needs:   Kcal:  1700-1900 kcals   Protein:  80-90 g  Fluid:  >/= 1.7 L   Romelle Starcher MS, RD, LDN, CNSC [phone] Pager  [phone] Weekend/On-Call Pager

## 2019-02-25 MED ORDER — AMLODIPINE BESYLATE 5 MG PO TABS
5.0000 mg | ORAL_TABLET | Freq: Every day | ORAL | Status: DC
  Administered 2019-02-25 – 2019-03-16 (×20): 5 mg via ORAL
  Filled 2019-02-25 (×21): qty 1

## 2019-02-25 NOTE — Progress Notes (Signed)
[facility] PHYSICAL MEDICINE & REHABILITATION PROGRESS NOTE   Subjective/Complaints:  No issues overnite, discussed therapy  ROS- denies CP SOB N/V/D  Objective:   [place] Lower Extremity Arterial Duplex  Result Date: 02/23/2019  ARTERIAL PSEUDOANEURYSM  Exam: Right groin History: S/p catheterization. Performing Technologist: Toma DeitersVirginia Slaughter RVS  Examination Guidelines: A complete evaluation includes B-mode imaging, spectral Doppler, color Doppler, and power Doppler as needed of all accessible portions of each vessel. Bilateral testing is considered an integral part of a complete examination. Limited examinations for reoccurring indications may be performed as noted. +------------+----------+---------+------+----------+ Right DuplexPSV (cm/s)Waveform PlaqueComment(s) +------------+----------+---------+------+----------+ CFA            156    biphasic                  +------------+----------+---------+------+----------+ PFA            -139   biphasic                  +------------+----------+---------+------+----------+ Prox SFA              triphasic                 +------------+----------+---------+------+----------+ Right Vein comments:No evidence of a DVT or A-V fistula. There appears to be evidence of a hematoma at the level of the common femoral artery.  Summary: No evidence of a right pseudoaneurysm, AVF or DVT  Diagnosing physician: Sherald Hesshristopher Clark MD Electronically signed by Sherald Hesshristopher Clark MD on 02/23/2019 at 5:00:30 PM.   --------------------------------------------------------------------------------    Final    Recent Labs    02/23/19 0304  WBC 8.9  HGB 13.3  HCT 38.9  PLT 276   Recent Labs    02/23/19 0304  NA 138  K 3.9  CL 106  CO2 22  GLUCOSE 113*  BUN 11  CREATININE 0.71  CALCIUM 9.4    Intake/Output Summary (Last 24 hours) at 02/25/2019 0636 Last data filed at 02/25/2019 0532 Gross per 24 hour  Intake 720 ml  Output 2 ml  Net  718 ml     Physical Exam: Vital Signs Blood pressure (!) 112/52, pulse 65, temperature 98.6 F (37 C), temperature source Oral, resp. rate 16, height 5\' 5"  (1.651 m), weight 68.5 kg, SpO2 97 %.   General: No acute distress Mood and affect are appropriate Heart: Regular rate and rhythm no rubs murmurs or extra sounds Lungs: Clear to auscultation, breathing unlabored, no rales or wheezes Abdomen: Positive bowel sounds, soft nontender to palpation, nondistended Extremities: No clubbing, cyanosis, or edema Skin: No evidence of breakdown, no evidence of rash Neurologic: Cranial nerves II through XII intact, motor strength is 5/5 in R and 3- Left  deltoid, bicep, tricep, grip,4- Left  hip flexor, knee extensors, ankle dorsiflexor and plantar flexor   Musculoskeletal: Full passive range of motion in all 4 extremities. No joint swelling   Assessment/Plan: 1. Functional deficits secondary to Right corona radiata infarct which require 3+ hours per day of interdisciplinary therapy in a comprehensive inpatient rehab setting.  Physiatrist is providing close team supervision and 24 hour management of active medical problems listed below.  Physiatrist and rehab team continue to assess barriers to discharge/monitor patient progress toward functional and medical goals  Care Tool:  Bathing    Body parts bathed by patient: Left arm, Abdomen, Chest, Front perineal area, Right upper leg, Left upper leg, Face   Body parts bathed by helper: Right arm, Buttocks, Right lower leg, Left lower leg  Bathing assist Assist Level: Moderate Assistance - Patient 50 - 74%     Upper Body Dressing/Undressing Upper body dressing   What is the patient wearing?: Pull over shirt    Upper body assist Assist Level: Moderate Assistance - Patient 50 - 74%    Lower Body Dressing/Undressing Lower body dressing      What is the patient wearing?: Incontinence brief, Pants     Lower body assist Assist for  lower body dressing: Maximal Assistance - Patient 25 - 49%     Toileting Toileting    Toileting assist Assist for toileting: Maximal Assistance - Patient 25 - 49%     Transfers Chair/bed transfer  Transfers assist     Chair/bed transfer assist level: Dependent - Patient 0%     Locomotion Ambulation   Ambulation assist   Ambulation activity did not occur: Safety/medical concerns(fatigue/pain )          Walk 10 feet activity   Assist  Walk 10 feet activity did not occur: Safety/medical concerns(fatigue/pain )        Walk 50 feet activity   Assist Walk 50 feet with 2 turns activity did not occur: Safety/medical concerns(fatigue/pain )         Walk 150 feet activity   Assist Walk 150 feet activity did not occur: Safety/medical concerns(fatigue/pain )         Walk 10 feet on uneven surface  activity   Assist Walk 10 feet on uneven surfaces activity did not occur: Safety/medical concerns(fatigue/pain )         Wheelchair     Assist Will patient use wheelchair at discharge?: Yes Type of Wheelchair: Manual    Wheelchair assist level: Supervision/Verbal cueing Max wheelchair distance: 40ft     Wheelchair 50 feet with 2 turns activity    Assist    Wheelchair 50 feet with 2 turns activity did not occur: Safety/medical concerns(fatigue/pain )       Wheelchair 150 feet activity     Assist Wheelchair 150 feet activity did not occur: Safety/medical concerns(fatigue/pain )        Medical Problem List and Plan: 1.Left side weaknesssecondary to 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 -CIR PT, OT -Plan outpatient coiling of aneurysm per interventional radiology 2. Antithrombotics: -DVT/anticoagulation:SCDs. -antiplatelet therapy: aspirin 81 mg daily Plavix 75 mg daily 3 weeks and  Plavix alone 3. Pain Management/migraine headaches:Tramadolasneeded -will schedule flexeril (home med) for right hand cramps/back -add kpad for low back 4. Mood:Provide emotional support -antipsychotic agents: N/A 5. Neuropsych: This patientiscapable of making decisions on herown behalf. 6. Skin/Wound Care:Routine skin checks 7. Fluids/Electrolytes/Nutrition:Routine in and outs with follow-up chemistries 8. History of traumatic [facility] May 2018. Patient seen in the past by Dr. Conchita Paris 9. Hypertension. Norvasc 10 mg daily Vitals:   02/24/19 1943 02/25/19 0624  BP: (!) 93/50 (!) 112/52  Pulse: 66 65  Resp: 18 16  Temp: 98 F (36.7 C) 98.6 F (37 C)  SpO2: 94% 97%  Controlled 4/26 but on low side , will reduce amlodipine 10. Hyperlipidemia. Lipitor 11. Tobacco abuse. Counseling    LOS: 2 days A FACE TO FACE EVALUATION WAS PERFORMED  Erick Colace 02/25/2019, 6:36 AM

## 2019-02-26 ENCOUNTER — Inpatient Hospital Stay (HOSPITAL_COMMUNITY): Payer: Medicare Other | Admitting: Physical Therapy

## 2019-02-26 ENCOUNTER — Inpatient Hospital Stay (HOSPITAL_COMMUNITY): Payer: Medicare Other

## 2019-02-26 ENCOUNTER — Inpatient Hospital Stay (HOSPITAL_COMMUNITY): Payer: Medicare Other | Admitting: Speech Pathology

## 2019-02-26 DIAGNOSIS — E162 Hypoglycemia, unspecified: Secondary | ICD-10-CM

## 2019-02-26 DIAGNOSIS — E46 Unspecified protein-calorie malnutrition: Secondary | ICD-10-CM

## 2019-02-26 DIAGNOSIS — I10 Essential (primary) hypertension: Secondary | ICD-10-CM

## 2019-02-26 DIAGNOSIS — D62 Acute posthemorrhagic anemia: Secondary | ICD-10-CM

## 2019-02-26 DIAGNOSIS — E8809 Other disorders of plasma-protein metabolism, not elsewhere classified: Secondary | ICD-10-CM

## 2019-02-26 DIAGNOSIS — Z87820 Personal history of traumatic brain injury: Secondary | ICD-10-CM

## 2019-02-26 LAB — CBC WITH DIFFERENTIAL/PLATELET
Abs Immature Granulocytes: 0.04 10*3/uL (ref 0.00–0.07)
Basophils Absolute: 0 10*3/uL (ref 0.0–0.1)
Basophils Relative: 0 %
Eosinophils Absolute: 0 10*3/uL (ref 0.0–0.5)
Eosinophils Relative: 0 %
HCT: 35.1 % — ABNORMAL LOW (ref 36.0–46.0)
Hemoglobin: 11.7 g/dL — ABNORMAL LOW (ref 12.0–15.0)
Immature Granulocytes: 1 %
Lymphocytes Relative: 9 %
Lymphs Abs: 0.8 10*3/uL (ref 0.7–4.0)
MCH: 28.6 pg (ref 26.0–34.0)
MCHC: 33.3 g/dL (ref 30.0–36.0)
MCV: 85.8 fL (ref 80.0–100.0)
Monocytes Absolute: 1.2 10*3/uL — ABNORMAL HIGH (ref 0.1–1.0)
Monocytes Relative: 13 %
Neutro Abs: 6.8 10*3/uL (ref 1.7–7.7)
Neutrophils Relative %: 77 %
Platelets: 254 10*3/uL (ref 150–400)
RBC: 4.09 MIL/uL (ref 3.87–5.11)
RDW: 13.1 % (ref 11.5–15.5)
WBC: 8.8 10*3/uL (ref 4.0–10.5)
nRBC: 0 % (ref 0.0–0.2)

## 2019-02-26 LAB — COMPREHENSIVE METABOLIC PANEL
ALT: 20 U/L (ref 0–44)
AST: 16 U/L (ref 15–41)
Albumin: 3.1 g/dL — ABNORMAL LOW (ref 3.5–5.0)
Alkaline Phosphatase: 46 U/L (ref 38–126)
Anion gap: 9 (ref 5–15)
BUN: 11 mg/dL (ref 8–23)
CO2: 26 mmol/L (ref 22–32)
Calcium: 8.6 mg/dL — ABNORMAL LOW (ref 8.9–10.3)
Chloride: 103 mmol/L (ref 98–111)
Creatinine, Ser: 0.86 mg/dL (ref 0.44–1.00)
GFR calc Af Amer: >60 mL/min (ref >60)
GFR calc non Af Amer: >60 mL/min (ref >60)
Glucose, Bld: 120 mg/dL — ABNORMAL HIGH (ref 70–99)
Potassium: 3.9 mmol/L (ref 3.5–5.1)
Sodium: 138 mmol/L (ref 135–145)
Total Bilirubin: 0.7 mg/dL (ref 0.3–1.2)
Total Protein: 5.9 g/dL — ABNORMAL LOW (ref 6.5–8.1)

## 2019-02-26 NOTE — Care Management Note (Signed)
Inpatient Rehabilitation Center Individual Statement of Services  Patient Name:  Connie Oconnor  Date:  02/26/2019  Welcome to the Inpatient Rehabilitation Center.  Our goal is to provide you with an individualized program based on your diagnosis and situation, designed to meet your specific needs.  With this comprehensive rehabilitation program, you will be expected to participate in at least 3 hours of rehabilitation therapies Monday-Friday, with modified therapy programming on the weekends.  Your rehabilitation program will include the following services:  Physical Therapy (PT), Occupational Therapy (OT), Speech Therapy (ST), 24 hour per day rehabilitation nursing, Neuropsychology, Case Management (Social Worker), Rehabilitation Medicine, Nutrition Services and Pharmacy Services  Weekly team conferences will be held on Wednesday to discuss your progress.  Your Social Worker will talk with you frequently to get your input and to update you on team discussions.  Team conferences with you and your family in attendance may also be held.  Expected length of stay: 2-3 weeks Overall anticipated outcome: supervision with cueing  Depending on your progress and recovery, your program may change. Your Social Worker will coordinate services and will keep you informed of any changes. Your Social Worker's name and contact numbers are listed  below.  The following services may also be recommended but are not provided by the Inpatient Rehabilitation Center:   Driving Evaluations  Home Health Rehabiltiation Services  Outpatient Rehabilitation Services    Arrangements will be made to provide these services after discharge if needed.  Arrangements include referral to agencies that provide these services.  Your insurance has been verified to be:  [employer]-Medicare Your primary doctor is:  Toy Cookey  Pertinent information will be shared with your doctor and your insurance company.  Social Worker:   Dossie Der, SW [phone] or [phone]  Information discussed with and copy given to patient by: Lucy Chris, 02/26/2019, 9:46 AM

## 2019-02-26 NOTE — Evaluation (Addendum)
Speech Language Pathology Bedside Swallow Assessment and Plan  Patient Details  Name: Connie Oconnor MRN: [id number] Date of Birth: 1941/08/07  SLP Diagnosis: Cognitive Impairments;Speech and Language deficits  Rehab Potential: Good ELOS: 2-3 weeks    Today's Date: 02/26/2019 SLP Individual Time: 1300-1400 SLP Individual Time Calculation (min): 60 min   Problem List:  Patient Active Problem List   Diagnosis Date Noted  . Hypoalbuminemia due to protein-calorie malnutrition ([place])   . Acute blood loss anemia   . Hypoglycemia   . Essential hypertension   . History of traumatic brain injury   . Lacunar infarct, acute ([place]) 02/23/2019  . 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 Right-sided lacunar infarction ([place]) 02/20/2019  . Hypothyroidism (acquired) 02/20/2019  . Closed fracture of medial portion of right tibial plateau 01/11/2018  . Acute medial meniscal tear, right, initial encounter 01/11/2018  . SAH (subarachnoid hemorrhage) ([place]) 03/29/2017   Past Medical History:  Past Medical History:  Diagnosis Date  . Anemia    hx of  . Arthritis   . Dislocation of right knee with medial meniscus tear   . Headache    hx of migraines none since 1990's  . Hypothyroidism    1978, no problems since  . Pneumonia 1990's none since  . PVC (premature ventricular contraction)    199'0's none since  . SAH (subarachnoid hemorrhage) ([place]) 2016   On the right   Past Surgical History:  Past Surgical History:  Procedure Laterality Date  . BACK SURGERY    . CERVICAL LAMINECTOMY  1986  . DILATION AND CURETTAGE OF UTERUS  1970  . ELBOW SURGERY Right 1993  . IR ANGIO INTRA EXTRACRAN SEL COM CAROTID INNOMINATE BILAT MOD SED  02/22/2019  . IR ANGIO VERTEBRAL SEL SUBCLAVIAN INNOMINATE UNI L MOD SED  02/22/2019  . IR ANGIO VERTEBRAL SEL VERTEBRAL UNI R MOD SED  02/22/2019  . KNEE ARTHROSCOPY WITH SUBCHONDROPLASTY Right 01/11/2018   Procedure: KNEE ARTHROSCOPY WITH SUBCHONDROPLASTY,  PARTIAL MEDIAL MENISCECTOMY, CHONDROPLASTY OF THE MEDIAL PATELLA FEMORAL CHONDYL, MEDIAL PLICA  EXCISION;  Surgeon: Dorna Leitz, MD;  Location: [facility];  Service: Orthopedics;  Laterality: Right;  . LUMBAR LAMINECTOMY  1993   rods placed bone generated removed one year later   . MULTIPLE TOOTH EXTRACTIONS    . SHOULDER SURGERY Left    arthritis clean out and shortened collar bone dr Eddie Dibbles  . TONSILLECTOMY  1970  . VAGINAL HYSTERECTOMY  1971   partial dr Mallie Mussel  . WISDOM TOOTH EXTRACTION      Assessment / Plan / Recommendation Clinical Impression Pt was seen for skilled ST intervention targeting bedside swallow evaluation. Pt was reclined in bed with lunch tray in front of her, sleeping. Pt was awakened easily with verbal stim. Pt was observed to have difficulty with current (regular consistency solids) diet, requiring assistance with cutting solids, and exhibiting extended oral prep of solids. When asked, pt reported she has difficulty chewing solids that aren't soft. In addition, pt was falling asleep during the meal.  Poor attention to task was evident, with pt almost spilling her tea when alertness declined. Will downgrade diet to mechanical soft/chopped meats with extra gravy, continue thin liquids. Diet change is to maximize energy conservation, encourage adequate po intake, and decrease caregiver burden. Recommend not leaving tray in front of pt unless she is [place]. Also recommend intermittent supervision during meals. Safe swallow precautions were posted at [facility]. SLP will continue to follow to assess diet tolerance  and provide education.   Skilled Therapeutic Interventions          BSE was completed during lunch, due to difficulties noted upon arrival of SLP for session.   Following swallow evaluation, SLP facilitated cognitive treatment by providing a simple maze for pt to complete. Pt required max visual and verbal cues, and was still unable to complete the trail through the maze,  possible due to fatigue after lunch and morning therapies.    SLP Assessment  Patient will need skilled Speech Language Pathology Services during CIR admission    Recommendations  SLP Diet Recommendations: Dysphagia 3 (Mech soft);Thin Liquid Administration via: Cup;Straw Medication Administration: Whole meds with liquid Supervision: Patient able to self feed;Intermittent supervision to cue for compensatory strategies Compensations: Minimize environmental distractions;Slow rate;Small sips/bites Postural Changes and/or Swallow Maneuvers: Seated upright 90 degrees;Upright 30-60 min after meal Oral Care Recommendations: Oral care BID(remove and clean dentures as well) Recommendations for Other Services: Neuropsych consult Patient destination: Home Follow up Recommendations: 24 hour supervision/assistance;Home Health SLP Equipment Recommended: None recommended by SLP    SLP Frequency 3 to 5 out of 7 days   SLP Duration  SLP Intensity  SLP Treatment/Interventions 2-3 weeks  Minumum of 1-2 x/day, 30 to 90 minutes  Cognitive remediation/compensation;Environmental controls;Internal/external aids;Speech/Language facilitation;Therapeutic Activities;Patient/family education;Functional tasks;Cueing hierarchy    Pain Pain Assessment Pain Scale: 0-10 Pain Score: 0-No pain  Prior Functioning Cognitive/Linguistic Baseline: Within functional limits Type of Home: House  Lives With: Alone Available Help at Discharge: Family Education: Nurses' aid training Vocation: Retired  Industrial/product designer Term Goals: Week 1: SLP Short Term Goal 1 (Week 1): Patient will demonstrate functional problem solving for mildly complex tasks with Mod A verbal cues.  SLP Short Term Goal 2 (Week 1): Patient will demonstrate sustained attention to functional tasks for 15 minutes with Min A verbal cues for redirection.  SLP Short Term Goal 3 (Week 1): Patient will recall new, daily information with Min A multimodal cues.  SLP  Short Term Goal 4 (Week 1): Patient will utilize word-finding strategies at the conversation level with Mod I.  SLP Short Term Goal 5 (Week 1): Pt will tolerate mechanical soft diet and thin liquids without obvious oral issues, overt s/s aspiration, or decline in respiratory status.  Refer to Care Plan for Long Term Goals  Recommendations for other services: Neuropsych  Discharge Criteria: Patient will be discharged from SLP if patient refuses treatment 3 consecutive times without medical reason, if treatment goals not met, if there is a change in medical status, if patient makes no progress towards goals or if patient is discharged from hospital.  The above assessment, treatment plan, treatment alternatives and goals were discussed and mutually agreed upon: by patient   B. Quentin Ore, [facility], CCC-SLP Speech Language Pathologist  Shonna Chock 02/26/2019, 2:50 PM

## 2019-02-26 NOTE — Plan of Care (Signed)
  Problem: RH BOWEL ELIMINATION Goal: RH STG MANAGE BOWEL W/MEDICATION W/ASSISTANCE Description STG Manage Bowel with Medication with Assistance. min  Outcome: Progressing   Problem: RH BLADDER ELIMINATION Goal: RH STG MANAGE BLADDER WITH ASSISTANCE Description STG Manage Bladder With Assistance Min  Outcome: Progressing   Problem: RH SAFETY Goal: RH STG ADHERE TO SAFETY PRECAUTIONS W/ASSISTANCE/DEVICE Description STG Adhere to Safety Precautions With Assistance/Device. Min  Outcome: Progressing   Problem: Consults Goal: RH STROKE PATIENT EDUCATION Description See Patient Education module for education specifics  Outcome: Progressing   

## 2019-02-26 NOTE — Progress Notes (Signed)
Inpatient Rehabilitation  Patient information reviewed and entered into eRehab system by Conner Muegge M. Mala Gibbard, M.A., CCC/SLP, PPS Coordinator.  Information including medical coding, functional ability and quality indicators will be reviewed and updated through discharge.    

## 2019-02-26 NOTE — Progress Notes (Addendum)
Physical Therapy Session Note  Patient Details  Name: Connie Oconnor MRN: [id number] Date of Birth: 1941-04-24  Today's Date: 02/26/2019 PT Individual Time:0800  - 0900, 60 min     Short Term Goals: Week 1:  PT Short Term Goal 1 (Week 1): Patient to be able to maintain dynamic seated balance with no more than min guard  PT Short Term Goal 2 (Week 1): Patient to be able to perform sit to stand/stand pivot  transfers with minA and LRAD consistently  PT Short Term Goal 3 (Week 1): Patient to initiate gait training  PT Short Term Goal 4 (Week 1): Patient to be able to maintain dynamic standing balance with MinA   Skilled Therapeutic Interventions/Progress Updates:   Pt in bed eating with HOB raised to 45 degrees.  She stated that her granddaughter would help her after d/c, but would not move in with her. She stated that her family would provide frozen meals for her. Pt  exhibited poor insight into her deficits, even with leading questions.  Pt reported that since her R knee injury last year, she has used a RW inside and outside of the house.  A SPC was in the room and she later stated that she used it indoors.  Bed mobility: rolling R/L with min assist for donning PJ bottoms.  L side lying to sitting with mod assist.    neuromuscular re-education via mulitmodal cues and demo: L lateral leans x 10 with min assist to re-training M-L midline orientation.  Pt tends to lean L heavily in sitting. .Pt stated that she felt like she was falling when sitting with L lean, but reluctant to shift R; this improved with sitting duration x 3 minutes with supervision.  Therapeutic activity: sit> partial stand x 5.  Stand pivot bed> w/c with mod assist. PT donned TEDs with pt in sitting.  Pt used comb in R hand to comb hair, with min cues to attend to L side.  Gait: Sit> stand in parallel bars with mod assist.  Pt ambulated with mod assist x 2' before stating she needed to sit down.  Attempted gait using wall  bar on R, but pt unable to take a step. Sit>< stand x 2, wt shifting L><R with mod assist.  Pt exhausted; stating she just couldn't do more.  Pt remained sitting in w/c with seat belt alarm set, needs at hand and pillow under L arm for comfort, with needs at hand.     Therapy Documentation Precautions:  Precautions Precautions: Fall Precaution Comments: L hemi, back pain, needs time to initiate movements  Restrictions Weight Bearing Restrictions: No    Pain: 8/10 back pain; requested pain meds during session         Therapy/Group: Individual Therapy  Lavarius Doughten 02/26/2019, 7:48 AM

## 2019-02-26 NOTE — Progress Notes (Signed)
[place] PHYSICAL MEDICINE & REHABILITATION PROGRESS NOTE   Subjective/Complaints: Patient seen laying in bed this morning.  She states she slept well overnight.  She notes she had a good weekend.  ROS: Denies CP SOB N/V/D  Objective:   No results found. Recent Labs    02/26/19 0553  WBC 8.8  HGB 11.7*  HCT 35.1*  PLT 254   Recent Labs    02/26/19 0553  NA 138  K 3.9  CL 103  CO2 26  GLUCOSE 120*  BUN 11  CREATININE 0.86  CALCIUM 8.6*    Intake/Output Summary (Last 24 hours) at 02/26/2019 0957 Last data filed at 02/26/2019 0906 Gross per 24 hour  Intake 718 ml  Output 825 ml  Net -107 ml     Physical Exam: Vital Signs Blood pressure (!) 133/58, pulse 74, temperature 98.3 F (36.8 C), resp. rate 16, height 5\' 5"  (1.651 m), weight 68.5 kg, SpO2 98 %. Constitutional: No distress . Vital signs reviewed. HENT: Normocephalic.  Atraumatic. Eyes: EOMI. No discharge. Cardiovascular: No JVD. Respiratory: Normal effort. GI: Non-distended. Musc: No edema or tenderness in extremities. Neurologic: Alert and oriented x3, but some confusion noted Motor: Right upper extremity: 5/5 proximal to distal Right lower extremity: 4+/5 proximal to distal Left upper extremity: 2/5 proximal to distal  Left lower extremity: 4/5 proximal to distal Skin: No evidence of breakdown, no evidence of rash  Assessment/Plan: 1. Functional deficits secondary to right corona radiata infarct which require 3+ hours per day of interdisciplinary therapy in a comprehensive inpatient rehab setting.  Physiatrist is providing close team supervision and 24 hour management of active medical problems listed below.  Physiatrist and rehab team continue to assess barriers to discharge/monitor patient progress toward functional and medical goals  Care Tool:  Bathing    Body parts bathed by patient: Left arm, Abdomen, Chest, Front perineal area, Right upper leg, Left upper leg, Face   Body parts bathed  by helper: Right arm, Buttocks, Right lower leg, Left lower leg     Bathing assist Assist Level: Moderate Assistance - Patient 50 - 74%     Upper Body Dressing/Undressing Upper body dressing   What is the patient wearing?: Pull over shirt    Upper body assist Assist Level: Moderate Assistance - Patient 50 - 74%    Lower Body Dressing/Undressing Lower body dressing      What is the patient wearing?: Incontinence brief     Lower body assist Assist for lower body dressing: Maximal Assistance - Patient 25 - 49%     Toileting Toileting    Toileting assist Assist for toileting: Total Assistance - Patient < 25%(female urinal)     Transfers Chair/bed transfer  Transfers assist     Chair/bed transfer assist level: Dependent - mechanical lift     Locomotion Ambulation   Ambulation assist   Ambulation activity did not occur: Safety/medical concerns(fatigue/pain )          Walk 10 feet activity   Assist  Walk 10 feet activity did not occur: Safety/medical concerns(fatigue/pain )        Walk 50 feet activity   Assist Walk 50 feet with 2 turns activity did not occur: Safety/medical concerns(fatigue/pain )         Walk 150 feet activity   Assist Walk 150 feet activity did not occur: Safety/medical concerns(fatigue/pain )         Walk 10 feet on uneven surface  activity   Assist Walk 10  feet on uneven surfaces activity did not occur: Safety/medical concerns(fatigue/pain )         Wheelchair     Assist Will patient use wheelchair at discharge?: Yes Type of Wheelchair: Manual    Wheelchair assist level: Supervision/Verbal cueing Max wheelchair distance: 66ft     Wheelchair 50 feet with 2 turns activity    Assist    Wheelchair 50 feet with 2 turns activity did not occur: Safety/medical concerns(fatigue/pain )       Wheelchair 150 feet activity     Assist Wheelchair 150 feet activity did not occur: Safety/medical  concerns(fatigue/pain )        Medical Problem List and Plan: 1.Left side weaknesssecondary to 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  Cont CIR Plan outpatient coiling of aneurysm per interventional radiology  Weekend notes reviewed- muscle cramps, images personally reviewed- small > moderate right corona radiata infarct, labs reviewed 2. Antithrombotics: -DVT/anticoagulation:SCDs. -antiplatelet therapy: aspirin 81 mg daily Plavix 75 mg daily 3 weeks and Plavix alone 3. Pain Management/migraine headaches:Tramadolasneeded Scheduled Flexeril (home med) for right hand cramps/back Added kpad for low back 4. Mood:Provide emotional support -antipsychotic agents: N/A 5. Neuropsych: This patientisnot fully capable of making decisions on herown behalf. 6. Skin/Wound Care:Routine skin checks 7. Fluids/Electrolytes/Nutrition:Routine in and outs 8. History of traumatic [facility] May 2018. Patient seen in the past by Dr. Conchita Paris 9. Hypertension.  Vitals:   02/25/19 2010 02/26/19 0524  BP: (!) 128/42 (!) 133/58  Pulse: 79 74  Resp: 16 16  Temp: 99 F (37.2 C) 98.3 F (36.8 C)  SpO2: 93% 98%   Norvasc 10 mg daily, decreased to 5 mg on 4/26  Diastolic blood pressure remains low on 4/27 10. Hyperlipidemia. Lipitor 11. Tobacco abuse. Counseling 12.  Hyperglycemia  Hemoglobin A1c 5.5 on 01/2019  Continue to monitor with increased activity 13.  Acute blood loss anemia  Hemoglobin 11.7 on 4/27  Continue to monitor 14.  Hypoalbuminemia  Supplement initiated on 4/27    LOS: 3 days A FACE TO FACE EVALUATION WAS PERFORMED  Lihanna Biever Karis Juba 02/26/2019, 9:57 AM

## 2019-02-26 NOTE — Discharge Instructions (Signed)
Inpatient Rehab Discharge Instructions  Connie Oconnor Discharge date and time: No discharge date for patient encounter.   Activities/Precautions/ Functional Status: Activity: activity as tolerated Diet: regular diet Wound Care: none needed Functional status:  ___ No restrictions     ___ Walk up steps independently ___ 24/7 supervision/assistance   ___ Walk up steps with assistance ___ Intermittent supervision/assistance  ___ Bathe/dress independently ___ Walk with walker     _x__ Bathe/dress with assistance ___ Walk Independently    ___ Shower independently ___ Walk with assistance    ___ Shower with assistance ___ No alcohol     ___ Return to work/school ________  Special Instructions:  Aspirin and Plavix 3 weeks and Plavix alone  No driving smoking or alcohol STROKE/TIA DISCHARGE INSTRUCTIONS SMOKING Cigarette smoking nearly doubles your risk of having a stroke & is the single most alterable risk factor  If you smoke or have smoked in the last 12 months, you are advised to quit smoking for your health.  Most of the excess cardiovascular risk related to smoking disappears within a year of stopping.  Ask you doctor about anti-smoking medications  [place] Quit Line: 1-800-QUIT NOW  Free Smoking Cessation Classes [phone]  CHOLESTEROL Know your levels; limit fat & cholesterol in your diet  Lipid Panel     Component Value Date/Time   CHOL 169 02/21/2019 0512   TRIG 73 02/21/2019 0512   HDL 56 02/21/2019 0512   CHOLHDL 3.0 02/21/2019 0512   VLDL 15 02/21/2019 0512   LDLCALC 98 02/21/2019 0512      Many patients benefit from treatment even if their cholesterol is at goal.  Goal: Total Cholesterol (CHOL) less than 160  Goal:  Triglycerides (TRIG) less than 150  Goal:  HDL greater than 40  Goal:  LDL (LDLCALC) less than 100   BLOOD PRESSURE American Stroke Association blood pressure target is less that 120/80 mm/Hg  Your discharge blood pressure is:  BP: (!)  133/58  Monitor your blood pressure  Limit your salt and alcohol intake  Many individuals will require more than one medication for high blood pressure  DIABETES (A1c is a blood sugar average for last 3 months) Goal HGBA1c is under 7% (HBGA1c is blood sugar average for last 3 months)  Diabetes: No known diagnosis of diabetes    Lab Results  Component Value Date   HGBA1C 5.5 02/21/2019     Your HGBA1c can be lowered with medications, healthy diet, and exercise.  Check your blood sugar as directed by your physician  Call your physician if you experience unexplained or low blood sugars.  PHYSICAL ACTIVITY/REHABILITATION Goal is 30 minutes at least 4 days per week  Activity: Increase activity slowly, Therapies: Physical Therapy: Home Health Return to work:   Activity decreases your risk of heart attack and stroke and makes your heart stronger.  It helps control your weight and blood pressure; helps you relax and can improve your mood.  Participate in a regular exercise program.  Talk with your doctor about the best form of exercise for you (dancing, walking, swimming, cycling).  DIET/WEIGHT Goal is to maintain a healthy weight  Your discharge diet is:  Diet Order            Diet Heart Room service appropriate? Yes; Fluid consistency: Thin  Diet effective now              liquids Your height is:  Height: 5\' 5"  (165.1 cm) Your current weight is: Weight:  68.5 kg Your Body Mass Index (BMI) is:  BMI (Calculated): 25.13  Following the type of diet specifically designed for you will help prevent another stroke.  Your goal weight range is:    Your goal Body Mass Index (BMI) is 19-24.  Healthy food habits can help reduce 3 risk factors for stroke:  High cholesterol, hypertension, and excess weight.  RESOURCES Stroke/Support Group:  Call [phone]   STROKE EDUCATION PROVIDED/REVIEWED AND GIVEN TO PATIENT Stroke warning signs and symptoms How to activate emergency medical  system (call 911). Medications prescribed at discharge. Need for follow-up after discharge. Personal risk factors for stroke. Pneumonia vaccine given:  Flu vaccine given:  My questions have been answered, the writing is legible, and I understand these instructions.  I will adhere to these goals & educational materials that have been provided to me after my discharge from the hospital.     My questions have been answered and I understand these instructions. I will adhere to these goals and the provided educational materials after my discharge from the hospital.  Patient/Caregiver Signature _______________________________ Date __________  Clinician Signature _______________________________________ Date __________  Please bring this form and your medication list with you to all your follow-up doctor's appointments.

## 2019-02-26 NOTE — IPOC Note (Addendum)
Individualized overall Plan of Care [facility](IPOC) Patient Details Name: Connie MarusRebecca Oconnor MRN: [id number] DOB: 1941-10-29  Admitting Diagnosis: Right corona radiata infarct.  Hospital Problems: Active Problems:   Lacunar infarct, acute (HCC)     Functional Problem List: Nursing Behavior, Bladder, Endurance, Bowel, Motor, Nutrition, Pain, Safety  PT Balance, Pain, Perception, Safety, Endurance, Motor, Sensory  OT Balance, Cognition, Endurance, Motor, Perception, Safety, Sensory  SLP    TR         Basic ADL's: OT Grooming, Bathing, Dressing, Toileting     Advanced  ADL's: OT       Transfers: PT Bed Mobility, Bed to Chair, Car, State Street CorporationFurniture, Floor  OT Toilet, Tub/Shower     Locomotion: PT Ambulation, Psychologist, prison and probation servicesWheelchair Mobility, Stairs     Additional Impairments: OT Fuctional Use of Upper Extremity  SLP Social Cognition, Communication expression Problem Solving, Memory, Attention  TR      Anticipated Outcomes Item Anticipated Outcome  Self Feeding S  Swallowing      Basic self-care  S  Toileting  S   Bathroom Transfers S  Bowel/Bladder  remain continent. maintain regular pattern of empyting  Transfers  S, LRAD   Locomotion  S, LRAD   Communication     Cognition  Supervision   Pain  no pain or less than 3  Safety/Judgment  remain free of falls, skin breakdown, infection   Therapy Plan: PT Intensity: Minimum of 1-2 x/day ,45 to 90 minutes PT Frequency: 5 out of 7 days PT Duration Estimated Length of Stay: 2-3 weeks  OT Intensity: Minimum of 1-2 x/day, 45 to 90 minutes OT Frequency: 5 out of 7 days OT Duration/Estimated Length of Stay: 14-18 days SLP Intensity: Minumum of 1-2 x/day, 30 to 90 minutes SLP Frequency: 3 to 5 out of 7 days SLP Duration/Estimated Length of Stay: 2-3 weeks    Team Interventions: Nursing Interventions Pain Management, Skin Care/Wound Management, Psychosocial Support, Medication Management, Discharge Planning  PT interventions  Ambulation/gait training, Cognitive remediation/compensation, Discharge planning, DME/adaptive equipment instruction, Functional mobility training, Pain management, Psychosocial support, Splinting/orthotics, Therapeutic Activities, Visual/perceptual remediation/compensation, UE/LE Strength taining/ROM, Warden/rangerBalance/vestibular training, Community reintegration, Disease management/prevention, Development worker, international aidunctional electrical stimulation, Neuromuscular re-education, Patient/family education, Skin care/wound management, Stair training, Therapeutic Exercise, UE/LE Coordination activities, Wheelchair propulsion/positioning  OT Interventions Warden/rangerBalance/vestibular training, Community reintegration, Disease mangement/prevention, Neuromuscular re-education, Development worker, international aidunctional electrical stimulation, Patient/family education, Self Care/advanced ADL retraining, Wheelchair propulsion/positioning, UE/LE Coordination activities, Therapeutic Exercise, Splinting/orthotics, Cognitive remediation/compensation, Discharge planning, DME/adaptive equipment instruction, Functional mobility training, Pain management, Psychosocial support, Skin care/wound managment, Therapeutic Activities, UE/LE Strength taining/ROM, Visual/perceptual remediation/compensation  SLP Interventions Cognitive remediation/compensation, Environmental controls, Internal/external aids, Speech/Language facilitation, Therapeutic Activities, Patient/family education, Functional tasks, Cueing hierarchy  TR Interventions    SW/CM Interventions     Barriers to Discharge MD  Medical stability  Nursing      PT      OT Decreased caregiver support, Lack of/limited family support pt will needs 24/7 S at d/c, unsure if family can provide  SLP      SW       Team Discharge Planning: Destination: PT-Home ,OT- Home , SLP-Home Projected Follow-up: PT-Home health PT, 24 hour supervision/assistance, OT-  Home health OT, SLP-24 hour supervision/assistance, Home Health SLP Projected Equipment  Needs: PT-Rolling walker with 5" wheels, 3 in 1 bedside comode, To be determined, OT- 3 in 1 bedside comode, Tub/shower seat, Tub/shower bench, To be determined, SLP-None recommended by SLP Equipment Details: PT- , OT-  Patient/family involved in discharge planning: PT- Patient,  OT-Patient,  SLP-Patient  MD ELOS: 14-17 days. Medical Rehab Prognosis:  Good Assessment: 78 year old right-handed female with past medical history of right SAH from a fall May 2018, hypothyroidism, tobacco abuse, back surgery. Presented on 02/20/2019 with acute left-sided weakness. Patient denied any fall or trauma. MRI the brain showed small acute infarct in the right corona radiata. MRA head was negative for large vessel occlusion. MRA of the neck showed a 7 mm aneurysm right MCA bifurcation. Patient did not receive TPA. Echocardiogram with ejection fraction of 65% normal systolic function.no source of embolism. 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 Neurology consulted maintained on aspirin 81 mg daily and Plavix 75 mg daily 3 weeks and Plavix alone. Plan is for outpatient coiling of cerebral aneurysm per interventional radiology. Patient with resulting functional deficits with mobility, endurance, self-care.  Will set goals for supervision with PT/OT/SLP.  Due to the current state of emergency, patients may not be receiving their 3-hours of Medicare-mandated therapy.  See Team Conference Notes for weekly updates to the plan of care

## 2019-02-26 NOTE — Progress Notes (Signed)
Occupational Therapy Session Note  Patient Details  Name: Connie Oconnor MRN: [id number] Date of Birth: 1941/04/23  Today's Date: 02/26/2019 OT Individual Time: 8295-6213 OT Individual Time Calculation (min): 30 min  and Today's Date: 02/26/2019 OT Missed Time: 15 Minutes Missed Time Reason: Patient fatigue   Short Term Goals: Week 1:  OT Short Term Goal 1 (Week 1): Pt will consistently transfer to toilet/BSC wiht MIN A OT Short Term Goal 2 (Week 1): Pt will initiate bathing body parts with min VC OT Short Term Goal 3 (Week 1): pt will thread BUE into shirt wiht S OT Short Term Goal 4 (Week 1): Pt will recall hemi techniques iwht min VC OT Short Term Goal 5 (Week 1): Pt will thread 1LE into pants wiht VC  Skilled Therapeutic Interventions/Progress Updates:    Pt asleep in w/c upon arrival, leaning significantly to L.  Pt required max A for correcting sitting posture.  Pt unable to keep eyes open during session and unable to self correct sitting posture.  Pt unable to actively engaged in functional tasks this morning secondary to fatigue. Discussed discharge plans and pt stated she will be alone with 78 year old granddaughter assisting.  Pt does not acknowledge that this is an unrealistic goal.  Pt with minimal awareness to deficits.  Pt stated she wanted to return to bed. Pt required max A for squat pivot transfer and sit>supine in bed.  Pt remained in bed with all needs within reach and bed alarm activated.  Note made on scheduling board to schedule rest breaks during day.   Therapy Documentation Precautions:  Precautions Precautions: Fall Precaution Comments: L hemi, back pain, needs time to initiate movements  Restrictions Weight Bearing Restrictions: No General: General OT Amount of Missed Time: 15 Minutes Pain: Pain Assessment Pain Scale: 0-10 Pain Score: 0-No pain Faces Pain Scale: No hurt   Therapy/Group: Individual Therapy  Rich Brave 02/26/2019, 11:51  AM

## 2019-02-26 NOTE — Progress Notes (Signed)
Social Work  Social Work Assessment and Plan  Patient Details  Name: Connie Oconnor MRN: [id number] Date of Birth: 25-Apr-1941  Today's Date: 02/26/2019  Problem List:  Patient Active Problem List   Diagnosis Date Noted  . Lacunar infarct, acute (HCC) 02/23/2019  . 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 Hypothyroidism (acquired) 02/20/2019  . Closed fracture of medial portion of right tibial plateau 01/11/2018  . Acute medial meniscal tear, right, initial encounter 01/11/2018  . SAH (subarachnoid hemorrhage) (HCC) 03/29/2017   Past Medical History:  Past Medical History:  Diagnosis Date  . Anemia    hx of  . Arthritis   . Dislocation of right knee with medial meniscus tear   . Headache    hx of migraines none since 1990's  . Hypothyroidism    1978, no problems since  . Pneumonia 1990's none since  . PVC (premature ventricular contraction)    199'0's none since  . SAH (subarachnoid hemorrhage) (HCC) 2016   On the right   Past Surgical History:  Past Surgical History:  Procedure Laterality Date  . BACK SURGERY    . CERVICAL LAMINECTOMY  1986  . DILATION AND CURETTAGE OF UTERUS  1970  . ELBOW SURGERY Right 1993  . IR ANGIO INTRA EXTRACRAN SEL COM CAROTID INNOMINATE BILAT MOD SED  02/22/2019  . IR ANGIO VERTEBRAL SEL SUBCLAVIAN INNOMINATE UNI L MOD SED  02/22/2019  . IR ANGIO VERTEBRAL SEL VERTEBRAL UNI R MOD SED  02/22/2019  . KNEE ARTHROSCOPY WITH SUBCHONDROPLASTY Right 01/11/2018   Procedure: KNEE ARTHROSCOPY WITH SUBCHONDROPLASTY, PARTIAL MEDIAL MENISCECTOMY, CHONDROPLASTY OF THE MEDIAL PATELLA FEMORAL CHONDYL, MEDIAL PLICA  EXCISION;  Surgeon: Connie Oconnor, John, MD;  Location: [facility];  Service: Orthopedics;  Laterality: Right;  . LUMBAR LAMINECTOMY  1993   rods placed bone generated removed one year later   . MULTIPLE TOOTH EXTRACTIONS    . SHOULDER SURGERY Left    arthritis clean out and shortened collar bone dr  Connie Oconnor  . TONSILLECTOMY  1970  . VAGINAL HYSTERECTOMY  1971   partial dr Connie Oconnor  . WISDOM TOOTH EXTRACTION     Social History:  reports that she has been smoking cigarettes. She has a 72.00 pack-year smoking history. She has never used smokeless tobacco. She reports that she does not drink alcohol or use drugs.  Family / Support Systems Marital Status: Widow/Widower Patient Roles: Parent, Caregiver, Other (Comment)(church member) Children: Connie Oconnor Oconnor-daughter [phone]-cell  [phone]-work 10 miles from Other Supports: Connie Oconnor-daughter [phone]-[phone]-cell one hour away Anticipated Caregiver: Connie Oconnor and Connie MessierKathy Ability/Limitations of Caregiver: Both work and Connie Messierkathy is out of town but will need to work this out Medical laboratory scientific officerCaregiver Availability: 24/7(Working on a plan) Family Dynamics: Close knit with both of her daughter's along with her grandchildren and great children. She has friends and church members who are supportive.   Social History Preferred language: English Religion: Non-Denominational Cultural Background: No issues Education: High School Read: Yes Write: Yes Employment Status: Retired Marine scientistLegal History/Current Legal Issues: No issues Guardian/Conservator: None-according to MD pt is capable of making his own decisions while here   Abuse/Neglect Abuse/Neglect Assessment Can Be Completed: Yes Physical Abuse: Denies Verbal Abuse: Denies Sexual Abuse: Denies Exploitation of patient/patient's resources: Denies Self-Neglect: Denies  Emotional Status Pt's affect, behavior and adjustment status: Pt is motivated to improve she has always been independent and taken care of herself even with her multiple health issues. She is hopeful she will be get back  to her independent level again. Recent Psychosocial Issues: other health issues-back issues and hx SAH couple years ago Psychiatric History: No history deferred depression screen due to tired from not sleeping and back issues. Will re-assess and  get input from team to see if needs to see neuro-psych services. Substance Abuse History: Tobacco-aware needs to quit smoking and the health issues associated with this.  Patient / Family Perceptions, Expectations & Goals Pt/Family understanding of illness & functional limitations: Pt and daughter can explain her stroke and deficits. They have spoken with the MD on acute and feel they have a good understanding of her treatment plan going forward. They want to continue to hear from MD treating pt. Premorbid pt/family roles/activities: Mom, grandmother, great grandmother, caregiver, church member, etc Anticipated changes in roles/activities/participation: resume Pt/family expectations/goals: Pt states: " I want to do for myself not to burden my daughter's."  Connie Asp states: " We will come up with a plan for her, we know she will need help at discharge."  [name] Premorbid Home Care/DME Agencies: Other (Comment)(rw,wc,cane) Transportation available at discharge: Connie Asp and [employer] referrals recommended: Neuropsychology, Support group (specify)  Discharge Planning Living Arrangements: Alone Support Systems: Children, Other relatives, Friends/neighbors, Psychologist, clinical community Type of Residence: Private residence Insurance Resources: Media planner (specify)(UHC-Medicare) Financial Resources: Restaurant manager, fast food Screen Referred: No Living Expenses: Own Money Management: Patient Does the patient have any problems obtaining your medications?: No Home Management: Self Patient/Family Preliminary Plans: Return home with daughter's making arrangements to provide care at discharge. Made aware team's recommendation is 24 hr supervision at discharge and ELOS 2-3 weeks. Eval this weekend so will see if can reach her goals while here. Sw Barriers to Discharge: Decreased caregiver support Sw Barriers to Discharge Comments: Daughter's to come up with a  plan for caring for pt Social Work Anticipated Follow Up Needs: HH/OP, Support Group  Clinical Impression Pleasant female who has always managed to remain at a independent level even with her health issues-back, SAH,weakness, etc. Both daughter's are involved and willing to arrange care for Mom once discharged. Work on discharge needs and making contact with daughter's-video chat.  Lucy Chris 02/26/2019, 9:42 AM

## 2019-02-26 NOTE — Progress Notes (Signed)
Physical Therapy Session Note  Patient Details  Name: Connie Oconnor MRN: [id number] Date of Birth: 1941-08-14  Today's Date: 02/26/2019 PT Individual Time: 0930-0953 PT Individual Time Calculation (min): 23 min   Short Term Goals: Week 1:  PT Short Term Goal 1 (Week 1): Patient to be able to maintain dynamic seated balance with no more than min guard  PT Short Term Goal 2 (Week 1): Patient to be able to perform sit to stand/stand pivot  transfers with minA and LRAD consistently  PT Short Term Goal 3 (Week 1): Patient to initiate gait training  PT Short Term Goal 4 (Week 1): Patient to be able to maintain dynamic standing balance with MinA   Skilled Therapeutic Interventions/Progress Updates:    pt performs squat pivot transfers throughout session with mod/max A due to fear of falling, resistance to movement and balance and postural deficits.  Sitting balance with focus on decreasing Lt lean and LOB with Rt reaching. Pt requires max cues and is only able to maintain midline for only 1-2 seconds at a time.  Pt positioned in w/c to improve midline posture and positioning.  Pt left with alarm set, needs at hand.  Therapy Documentation Precautions:  Precautions Precautions: Fall Precaution Comments: L hemi, back pain, needs time to initiate movements  Restrictions Weight Bearing Restrictions: No Pain: Pain Assessment Pain Scale: 0-10 Pain Score: 0-No pain Faces Pain Scale: No hurt   Therapy/Group: Individual Therapy  Ilya Ess 02/26/2019, 9:56 AM

## 2019-02-27 ENCOUNTER — Inpatient Hospital Stay (HOSPITAL_COMMUNITY): Payer: Medicare Other

## 2019-02-27 DIAGNOSIS — I959 Hypotension, unspecified: Secondary | ICD-10-CM

## 2019-02-27 DIAGNOSIS — R739 Hyperglycemia, unspecified: Secondary | ICD-10-CM

## 2019-02-27 NOTE — Progress Notes (Addendum)
Speech Language Pathology Daily Session Note  Patient Details  Name: Connie Oconnor MRN: [id number] Date of Birth: 08-02-1941  Today's Date: 02/27/2019 SLP Individual Time: 1500-1530 SLP Individual Time Calculation (min): 30 min  Short Term Goals: Week 1: SLP Short Term Goal 1 (Week 1): Patient will demonstrate functional problem solving for mildly complex tasks with Mod A verbal cues.  SLP Short Term Goal 2 (Week 1): Patient will demonstrate sustained attention to functional tasks for 15 minutes with Min A verbal cues for redirection.  SLP Short Term Goal 3 (Week 1): Patient will recall new, daily information with Min A multimodal cues.  SLP Short Term Goal 4 (Week 1): Patient will utilize word-finding strategies at the conversation level with Mod I.  SLP Short Term Goal 5 (Week 1): Pt will tolerate mechanical soft diet and thin liquids without obvious oral issues, overt s/s aspiration, or decline in respiratory status.  Skilled Therapeutic Interventions:Skilled ST services focused on cognitive skills. SLP facilitated basic problem solving skills utilizing novel card task played at simplest level, however pt demonstrated severe sustained attention requiring verbal cuing every 1-2 minutes, eyes would begin to close, which continued throughout session. SLP facilitated basic problem solving in card sorting/matching task by color, pt demonstrated mod I for problem solving, requiring mod A verbal cues for recall of sorting/matching method (color verse shape.) Pt was left in room with call bell within reach and chair alarm set. ST recommends to continue skilled ST services.      Pain Pain Assessment Pain Score: 0-No pain Faces Pain Scale: No hurt PAINAD (Pain Assessment in Advanced Dementia) Breathing: normal Negative Vocalization: none Facial Expression: smiling or inexpressive Body Language: relaxed Consolability: no need to console PAINAD Score: 0  Therapy/Group: Individual  Therapy  Gearald Stonebraker  [facility] 02/27/2019, 3:55 PM

## 2019-02-27 NOTE — Progress Notes (Signed)
Occupational Therapy Session Note  Patient Details  Name: Connie Oconnor MRN: [id number] Date of Birth: [birth year]  Today's Date: 02/27/2019 OT Individual Time: 0211-1552 OT Individual Time Calculation (min): 74 min    Short Term Goals: Week 1:  OT Short Term Goal 1 (Week 1): Pt will consistently transfer to toilet/BSC wiht MIN A OT Short Term Goal 2 (Week 1): Pt will initiate bathing body parts with min VC OT Short Term Goal 3 (Week 1): pt will thread BUE into shirt wiht S OT Short Term Goal 4 (Week 1): Pt will recall hemi techniques iwht min VC OT Short Term Goal 5 (Week 1): Pt will thread 1LE into pants wiht VC  Skilled Therapeutic Interventions/Progress Updates:    Pt resting in bed upon arrival.  Pt with initial difficulty turning head to L which she attributed to surgery PTA, however pt with increased AROM as session progressed.  Pt is able to scan past midline to L. Pt with improved LUE AROM when supine in bed and weak grasp.  OT intervention with focus on bed mobility, sitting balance, sit<>stand, standing balance, BADL retraining, toileting, attention to task, attention to L, activity tolerance, and safety awareness to increase independence with BADLs. Pt oriented X 4 but continues to state that she wants to go home.  Pt does acknowledge that she would be unable to care for herself if she went home now.  Pt with limited awareness of deficits. Pt required max A for supine>sit EOB in preparation for use of Stedy to go to toilet.  Pt with significant flexed trunk and L lean/push.  Attempted to elongate trunk to R to improve sitting posture but pt requires max A to maintain.  Pt performs sit<>stand in [place] with min A but requires max A for upright posture to allow seat to be positioned.  Pt able to perform toilet hygiene but requires tot A for clothing management.  Pt completed LB dressing at bed level with mod A for rolling R<>L.  Pt remained in bed.  Pt requires different w/c to provide  appropriate support.  PT notified of w/c requirements. All needs within reach and bed alarm activated.   Therapy Documentation Precautions:  Precautions Precautions: Fall Precaution Comments: L hemi, back pain, needs time to initiate movements  Restrictions Weight Bearing Restrictions: No  Pain:  Pt c/o 5/10 pain in RLE/hip; repositioned   Therapy/Group: Individual Therapy  Rich Brave 02/27/2019, 10:32 AM

## 2019-02-27 NOTE — Plan of Care (Signed)
  Problem: RH BLADDER ELIMINATION Goal: RH STG MANAGE BLADDER WITH ASSISTANCE Description STG Manage Bladder With Assistance Min  Outcome: Progress  Assist with bathroom needs.

## 2019-02-27 NOTE — Progress Notes (Signed)
[facility] PHYSICAL MEDICINE & REHABILITATION PROGRESS NOTE   Subjective/Complaints: Patient seen laying in bed this morning.  She states she slept well overnight.  She continues to have some language of confusion.  ROS: Denies CP SOB N/V/D  Objective:   No results found. Recent Labs    02/26/19 0553  WBC 8.8  HGB 11.7*  HCT 35.1*  PLT 254   Recent Labs    02/26/19 0553  NA 138  K 3.9  CL 103  CO2 26  GLUCOSE 120*  BUN 11  CREATININE 0.86  CALCIUM 8.6*    Intake/Output Summary (Last 24 hours) at 02/27/2019 0855 Last data filed at 02/27/2019 2924 Gross per 24 hour  Intake 800 ml  Output -  Net 800 ml     Physical Exam: Vital Signs Blood pressure (!) 123/50, pulse 76, temperature 98.4 F (36.9 C), resp. rate 18, height 5\' 5"  (1.651 m), weight 68.5 kg, SpO2 97 %. Constitutional: No distress . Vital signs reviewed. HENT: Normocephalic.  Atraumatic Eyes: EOMI.  No discharge. Cardiovascular: No JVD. Respiratory: Normal effort. GI: Non-distended. Musc: No edema or tenderness in extremities. Neurologic: Alert and oriented x3, but some confusion noted Motor:  Left upper extremity: 3+/5 proximal to distal  Left lower extremity: 4-4+/5 proximal to distal Skin: No evidence of breakdown, no evidence of rash  Assessment/Plan: 1. Functional deficits secondary to right corona radiata infarct which require 3+ hours per day of interdisciplinary therapy in a comprehensive inpatient rehab setting.  Physiatrist is providing close team supervision and 24 hour management of active medical problems listed below.  Physiatrist and rehab team continue to assess barriers to discharge/monitor patient progress toward functional and medical goals  Care Tool:  Bathing    Body parts bathed by patient: Left arm, Abdomen, Chest, Front perineal area, Right upper leg, Left upper leg, Face   Body parts bathed by helper: Right arm, Buttocks, Right lower leg, Left lower leg     Bathing  assist Assist Level: Moderate Assistance - Patient 50 - 74%     Upper Body Dressing/Undressing Upper body dressing   What is the patient wearing?: Pull over shirt    Upper body assist Assist Level: Moderate Assistance - Patient 50 - 74%    Lower Body Dressing/Undressing Lower body dressing      What is the patient wearing?: Incontinence brief     Lower body assist Assist for lower body dressing: Maximal Assistance - Patient 25 - 49%     Toileting Toileting    Toileting assist Assist for toileting: Total Assistance - Patient < 25%(female urinal)     Transfers Chair/bed transfer  Transfers assist     Chair/bed transfer assist level: Dependent - mechanical lift     Locomotion Ambulation   Ambulation assist   Ambulation activity did not occur: Safety/medical concerns(fatigue/pain )          Walk 10 feet activity   Assist  Walk 10 feet activity did not occur: Safety/medical concerns(fatigue/pain )        Walk 50 feet activity   Assist Walk 50 feet with 2 turns activity did not occur: Safety/medical concerns(fatigue/pain )         Walk 150 feet activity   Assist Walk 150 feet activity did not occur: Safety/medical concerns(fatigue/pain )         Walk 10 feet on uneven surface  activity   Assist Walk 10 feet on uneven surfaces activity did not occur: Safety/medical concerns(fatigue/pain )  Wheelchair     Assist Will patient use wheelchair at discharge?: Yes Type of Wheelchair: Manual    Wheelchair assist level: Supervision/Verbal cueing Max wheelchair distance: 42ft     Wheelchair 50 feet with 2 turns activity    Assist    Wheelchair 50 feet with 2 turns activity did not occur: Safety/medical concerns(fatigue/pain )       Wheelchair 150 feet activity     Assist Wheelchair 150 feet activity did not occur: Safety/medical concerns(fatigue/pain )        Medical Problem List and Plan: 1.Left side  weaknesssecondary to 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  Cont CIR Plan outpatient coiling of aneurysm per interventional radiology 2. Antithrombotics: -DVT/anticoagulation:SCDs. -antiplatelet therapy: aspirin 81 mg daily Plavix 75 mg daily 3 weeks and Plavix alone 3. Pain Management/migraine headaches:Tramadolasneeded Scheduled Flexeril (home med) for right hand cramps/back Added kpad for low back 4. Mood:Provide emotional support -antipsychotic agents: N/A 5. Neuropsych: This patientisnot fully capable of making decisions on herown behalf. 6. Skin/Wound Care:Routine skin checks 7. Fluids/Electrolytes/Nutrition:Routine in and outs 8. [facility] of [facility]. Patient seen in the past by Dr. Conchita ParisNundkumar 9. Hypertension.  Vitals:   02/26/19 1430 02/27/19 0516  BP: (!) 124/54 (!) 123/50  Pulse: 71 76  Resp: 19 18  Temp: 98.7 F (37.1 C) 98.4 F (36.9 C)  SpO2: 95% 97%   Norvasc 10 mg daily, decreased to 5 mg on 4/26  Diastolic blood pressure remains low on 4/28 10. Hyperlipidemia. Lipitor 11. Tobacco abuse. Counseling 12.  Hyperglycemia  Hemoglobin A1c 5.5 on 01/2019  P.o. intake inconsistent  Continue to monitor with increased activity 13.  Acute blood loss anemia  Hemoglobin 11.7 on 4/27  Continue to monitor 14.  Hypoalbuminemia  Supplement initiated on 4/27    LOS: 4 days A FACE TO FACE EVALUATION WAS PERFORMED  Jasper Hanf Karis Jubanil Jelissa Espiritu 02/27/2019, 8:55 AM

## 2019-02-27 NOTE — Progress Notes (Signed)
Physical Therapy Session Note  Patient Details  Name: Connie Oconnor MRN: [id number] Date of Birth: 1940/11/18  Today's Date: 02/27/2019 PT Individual Time:Session1: 3338-3291; [place]: 9166-0600 PT Individual Time Calculation (min)Session1: 18 min; Session2: 75 min  Short Term Goals: Week 1:  PT Short Term Goal 1 (Week 1): Patient to be able to maintain dynamic seated balance with no more than min guard  PT Short Term Goal 2 (Week 1): Patient to be able to perform sit to stand/stand pivot  transfers with minA and LRAD consistently  PT Short Term Goal 3 (Week 1): Patient to initiate gait training  PT Short Term Goal 4 (Week 1): Patient to be able to maintain dynamic standing balance with MinA   Skilled Therapeutic Interventions/Progress Updates:    Session1:  Patient in supine and agreeable to PT.  OT had previously informed that pt not in safe position in current w/c due to L lateral leaning.  Attempting to locate reclining or TIS w/c, but none available.  Obtained w/c with rigid back and placed towel for wedge under L side of cushion.  Patient rolling to L with S and side to sit mod A for trunk due to L lateral lean.  Patient transferred to w/c mod A to R and positioned with leg rests, alarm belt activated and lunch tray set up and pt with call bell in reach.  Session2:  Patient in w/c after lunch and agreeable to PT.  Assisted in w/c to therapy gym and obtained walker for pt and placed L hand splint on walker.  Patient sit to stand x 2 to RW with mod A and performed lateral weight shifts.  Reported difficulty taking steps due to fear of falling and R LE pain.  Patient stood third time and ambulated 5' to armchair with RW and mod A cues for safety with turns.  Ambulated back to w/c mod A with RW.  Patient in parallel bars performed standing taps to 4" step x 2 prior to fatigue.  Sit to stand 2 more times in bars to reach for and toss horse shoes with min to mod A for balance when without R hand  support.  Patient in w/c propelled about 42' with S and mod cues prior to stopping due to fatigue.  Assisted to room and pt requesting back to bed.  Noted for ST session in 15 minutes and pt encouraged to sit up till speech session over.  NT made aware and pt left with alarm belt activated and call bell in reach.  Therapy Documentation Precautions:  Precautions Precautions: Fall Precaution Comments: L hemi, back pain, needs time to initiate movements  Restrictions Weight Bearing Restrictions: No General: PT Amount of Missed Time (min): 12 Minutes PT Missed Treatment Reason: Other (Comment)(PT locating different w/c) Pain: Pain Assessment Pain Score: 0-No pain Faces Pain Scale: Hurts even more Pain Type: Acute pain Pain Location: Groin Pain Descriptors / Indicators: Discomfort;Guarding;Grimacing Pain Onset: With Activity Pain Intervention(s): Repositioned       Therapy/Group: Individual Therapy  Elray Mcgregor  [place], [place] 02/27/2019, 5:00 PM

## 2019-02-28 ENCOUNTER — Inpatient Hospital Stay (HOSPITAL_COMMUNITY): Payer: Medicare Other

## 2019-02-28 ENCOUNTER — Inpatient Hospital Stay (HOSPITAL_COMMUNITY): Payer: Medicare Other | Admitting: *Deleted

## 2019-02-28 ENCOUNTER — Encounter (HOSPITAL_COMMUNITY): Payer: Medicare Other | Admitting: Psychology

## 2019-02-28 ENCOUNTER — Inpatient Hospital Stay (HOSPITAL_COMMUNITY): Payer: Medicare Other | Admitting: Speech Pathology

## 2019-02-28 NOTE — Progress Notes (Signed)
Occupational Therapy Session Note  Patient Details  Name: Connie Oconnor MRN: [id number] Date of Birth: [birth year]  Today's Date: 02/28/2019 OT Individual Time: 1300-1355 OT Individual Time Calculation (min): 55 min    Short Term Goals: Week 1:  OT Short Term Goal 1 (Week 1): Pt will consistently transfer to toilet/BSC wiht MIN A OT Short Term Goal 2 (Week 1): Pt will initiate bathing body parts with min VC OT Short Term Goal 3 (Week 1): pt will thread BUE into shirt wiht S OT Short Term Goal 4 (Week 1): Pt will recall hemi techniques iwht min VC OT Short Term Goal 5 (Week 1): Pt will thread 1LE into pants wiht VC  Skilled Therapeutic Interventions/Progress Updates:    Pt asleep with HOB elevated upon arrival.  Pt's lunch tray was partially in front of her.  Pt easily aroused. OT intervention with focus on attention to her L, functional use of LUE, maintaining midline orientation in bed, attention to task, and awareness to increase independence with BADLs. Pt required HOH assist to use her LUE as gross stabilizer during eating tasks. Pt required min verbal cues to scan to her L to locate items on her lunch tray.  Pt states that she wants to go home tomorrow to be with her granddaughter.  I reminded pt of her goals and her deficits and reiterated that she still had some work to accomplish before she could go home. Pt required more than a reasonable amount of time to complete eating lunch with min verbal cues for redirection to task. L half lap tray located for use on w/c. Pt remained in bed with all needs with bed alarm activated.   Therapy Documentation Precautions:  Precautions Precautions: Fall Precaution Comments: L hemi, back pain, needs time to initiate movements  Restrictions Weight Bearing Restrictions: No Pain:  Pt stated her pain was "ok for now"; emoitonal support   Therapy/Group: Individual Therapy  Rich Brave 02/28/2019, 2:47 PM

## 2019-02-28 NOTE — Progress Notes (Signed)
Occupational Therapy Session Note  Patient Details  Name: Connie Oconnor MRN: [id number] Date of Birth: 09-Aug-1941  Today's Date: 02/28/2019 OT Individual Time: 5701-7793 OT Individual Time Calculation (min): 40 min  and Today's Date: 02/28/2019 OT Missed Time: 20 Minutes Missed Time Reason: Pain   Short Term Goals: Week 1:  OT Short Term Goal 1 (Week 1): Pt will consistently transfer to toilet/BSC wiht MIN A OT Short Term Goal 2 (Week 1): Pt will initiate bathing body parts with min VC OT Short Term Goal 3 (Week 1): pt will thread BUE into shirt wiht S OT Short Term Goal 4 (Week 1): Pt will recall hemi techniques iwht min VC OT Short Term Goal 5 (Week 1): Pt will thread 1LE into pants wiht VC  Skilled Therapeutic Interventions/Progress Updates:    Pt eating bed in breakfast in bed with HOB elevated upon arrival.  Initial focus on LUE/hand use to grasp yogurt container.  Pt with increased LUE tone this morning.  Pt agreeable to participating in therapy and initiated supine>sit EOB moving BLE to EOB and rolling onto L side.  Pt required mod A for supine>EOB with increase in R inguinal pain.  Once sitting EOB pt unable to maintain weightbearing on R hip/buttocks 2/2 increased pain.  Pt required max A for sit>supine EOB and repositioning in bed.  RN notified of increased pain.  Pt remained in bed awaiting RN. Bed alarm activated and all needs within reach.   Therapy Documentation Precautions:  Precautions Precautions: Fall Precaution Comments: L hemi, back pain, needs time to initiate movements  Restrictions Weight Bearing Restrictions: No General: General OT Amount of Missed Time: 20 Minutes Pain:  Pt c/o 10/10 R inguinal pain; RN aware and repositioned.   Therapy/Group: Individual Therapy  Rich Brave 02/28/2019, 8:51 AM

## 2019-02-28 NOTE — Progress Notes (Signed)
[facility] PHYSICAL MEDICINE & REHABILITATION PROGRESS NOTE   Subjective/Complaints: Patient seen laying in bed this morning.  She states she slept well overnight because she could not get comfortable.  She does have a left lean, but is aware and asks me to help reposition her.  ROS: Denies CP SOB N/V/D  Objective:   No results found. Recent Labs    02/26/19 0553  WBC 8.8  HGB 11.7*  HCT 35.1*  PLT 254   Recent Labs    02/26/19 0553  NA 138  K 3.9  CL 103  CO2 26  GLUCOSE 120*  BUN 11  CREATININE 0.86  CALCIUM 8.6*    Intake/Output Summary (Last 24 hours) at 02/28/2019 0905 Last data filed at 02/27/2019 2100 Gross per 24 hour  Intake 582 ml  Output 300 ml  Net 282 ml     Physical Exam: Vital Signs Blood pressure (!) 136/57, pulse 75, temperature 98.1 F (36.7 C), resp. rate 19, height 5\' 5"  (1.651 m), weight 68.5 kg, SpO2 94 %. Constitutional: No distress . Vital signs reviewed. HENT: Normocephalic.  Atraumatic. Eyes: EOMI.  No discharge. Cardiovascular: No JVD. Respiratory: Normal effort. GI: Non-distended. Musc: No edema or tenderness in extremities. Neurologic: Alert and oriented x3, but some confusion noted,?  Improving Motor:  Left upper extremity: 4-/5 proximal to distal  Left lower extremity: 4 +/5 proximal to distal Left lean Skin: Warm and dry.  Intact.  Assessment/Plan: 1. Functional deficits secondary to right corona radiata infarct which require 3+ hours per day of interdisciplinary therapy in a comprehensive inpatient rehab setting.  Physiatrist is providing close team supervision and 24 hour management of active medical problems listed below.  Physiatrist and rehab team continue to assess barriers to discharge/monitor patient progress toward functional and medical goals  Care Tool:  Bathing  Bathing activity did not occur: Refused Body parts bathed by patient: Left arm, Abdomen, Chest, Front perineal area, Right upper leg, Left upper  leg, Face   Body parts bathed by helper: Right arm, Buttocks, Right lower leg, Left lower leg     Bathing assist Assist Level: Moderate Assistance - Patient 50 - 74%     Upper Body Dressing/Undressing Upper body dressing   What is the patient wearing?: Pull over shirt    Upper body assist Assist Level: Moderate Assistance - Patient 50 - 74%    Lower Body Dressing/Undressing Lower body dressing      What is the patient wearing?: Pants, Incontinence brief     Lower body assist Assist for lower body dressing: Maximal Assistance - Patient 25 - 49%     Toileting Toileting    Toileting assist Assist for toileting: Maximal Assistance - Patient 25 - 49%     Transfers Chair/bed transfer  Transfers assist     Chair/bed transfer assist level: Moderate Assistance - Patient 50 - 74%     Locomotion Ambulation   Ambulation assist   Ambulation activity did not occur: Safety/medical concerns(fatigue/pain )  Assist level: Moderate Assistance - Patient 50 - 74% Assistive device: Walker-rolling(and L hand splint) Max distance: 5'   Walk 10 feet activity   Assist  Walk 10 feet activity did not occur: Safety/medical concerns(fatigue/pain )        Walk 50 feet activity   Assist Walk 50 feet with 2 turns activity did not occur: Safety/medical concerns(fatigue/pain )         Walk 150 feet activity   Assist Walk 150 feet activity did not  occur: Safety/medical concerns(fatigue/pain )         Walk 10 feet on uneven surface  activity   Assist Walk 10 feet on uneven surfaces activity did not occur: Safety/medical concerns(fatigue/pain )         Wheelchair     Assist Will patient use wheelchair at discharge?: Yes Type of Wheelchair: Manual    Wheelchair assist level: Supervision/Verbal cueing Max wheelchair distance: 54'    Wheelchair 50 feet with 2 turns activity    Assist    Wheelchair 50 feet with 2 turns activity did not occur:  Safety/medical concerns(fatigue/pain )       Wheelchair 150 feet activity     Assist Wheelchair 150 feet activity did not occur: Safety/medical concerns(fatigue/pain )        Medical Problem List and Plan: 1.Left side weaknesssecondary to 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  Cont CIR Plan outpatient coiling of aneurysm per interventional radiology  Team conference today to discuss current and goals and coordination of care, home and environmental barriers, and discharge planning with nursing, case manager, and therapies.  2. Antithrombotics: -DVT/anticoagulation:SCDs. -antiplatelet therapy: aspirin 81 mg daily Plavix 75 mg daily 3 weeks and Plavix alone 3. Pain Management/migraine headaches:Tramadolasneeded Scheduled Flexeril (home med) for right hand cramps/back Added kpad for low back 4. Mood:Provide emotional support -antipsychotic agents: N/A 5. Neuropsych: This patientis?  Not fully capable of making decisions on herown behalf.  ?  Cognition at baseline 6. Skin/Wound Care:Routine skin checks 7. Fluids/Electrolytes/Nutrition:Routine in and outs 8. History of traumatic [facility] May 2018. Patient seen in the past by Dr. Conchita Paris 9. Hypertension.  Vitals:   02/27/19 1937 02/28/19 0459  BP: (!) 147/58 (!) 136/57  Pulse: 78 75  Resp: 18 19  Temp: 99.1 F (37.3 C) 98.1 F (36.7 C)  SpO2: 96% 94%   Norvasc 10 mg daily, decreased to 5 mg on 4/26  Relatively controlled on 4/29 10. Hyperlipidemia. Lipitor 11. Tobacco abuse. Counseling 12.  Hyperglycemia  Hemoglobin A1c 5.5 on 01/2019  P.o. intake inconsistent on 4/29  Continue to monitor with increased activity 13.  Acute blood loss anemia  Hemoglobin 11.7 on 4/27  Continue to monitor 14.  Hypoalbuminemia  Supplement initiated on  4/27    LOS: 5 days A FACE TO FACE EVALUATION WAS PERFORMED  Forrest Jaroszewski Karis Juba 02/28/2019, 9:05 AM

## 2019-02-28 NOTE — Consult Note (Signed)
Neuropsychological Consultation   Patient:   Connie Oconnor   DOB:   05-30-1941  MR Number:  [id number]  [facility]  [facility] [address] [facility] [id number] [place] [id number] Dept: [phone] Loc: [phone]           Date of Service:   02/28/2019  Start Time:   3 PM End Time:   4 PM  Provider/Observer:  Arley PhenixJohn Rodenbough, Psy.D.       Clinical Neuropsychologist       Billing Code/Service: [phone]  Chief Complaint:    Connie Oconnor is a 78 year old female with past medical history of SAH after fall in 2018, hypothyroidism, tobacco abuse, back surgery.  Ambulated with rolling walker since back surgery.  Presented on 02/20/2019 with acute left-sided weakness.  MRI showed small acute infarct in right corona radiata.  MRA showed 7 mm aneurysm in right MCA bifurcation.  Aneurysm will be coiled later on outpatient basis through IR.    Reason for Service:  Patient was referred for neuropsychological consultation due to coping and adjustment issues.  Below is the HPI for the current admission.  UVO:[id number]:Connie Oconnor is a 78 year old right-handed female with past medical history of right SAH from a fall May 2018, hypothyroidism, tobacco abuse, back surgery.per chart review patient lives alone. One level home with ramped entrance. Ambulated with a rolling walker since her back surgery. She does have local family check on her routinely. Presented on 02/20/2019 with acute left-sided weakness. Patient denied any fall or trauma. MRI the brain showed small acute infarct in the right corona radiata. MRA head was negative for large vessel occlusion. MRA of the neck showed a 7 mm aneurysm right MCA bifurcation. Patient did not receive TPA. Echocardiogram with ejection fraction of 65% normal systolic function.no source of embolism. Cerebral arteriogram completed 02/22/2019 findings a 1.8 x 7.7 mm right MCA aneurysm.neurology  consulted maintained on aspirin 81 mg daily and Plavix 75 mg daily 3 weeks and Plavix alone. Plan is for outpatient coiling of cerebral aneurysm per interventional radiology. Patient is tolerating a regular diet. Therapy evaluations completed and patient was admitted for a comprehensive rehabilitation program.  Current Status:  Patient has some confusion and focus on back/leg pain.  Patient somewhat despondent and stating the "I am 78 and falling apart.  Patient was able to give general medical history but was not clear on some of details that she should know.  Patient reports that she is coping as well as she can but that with each medical events she gets more frustrated with loss of function.  She is most concerned about back pain and limitations from that.    Behavioral Observation: Connie Oconnor  presents as a 78 y.o.-year-old Right Caucasian Female who appeared her stated age. her dress was Appropriate and she was Well Groomed and her manners were Appropriate to the situation.  her participation was indicative of Appropriate and Redirectable behaviors.  There were any physical disabilities noted.  she displayed an appropriate level of cooperation and motivation.     Interactions:    Active Appropriate and Redirectable  Attention:   abnormal and attention span appeared shorter than expected for age  Memory:   abnormal; remote memory intact, recent memory impaired  Visuo-spatial:  not examined  Speech (Volume):  low  Speech:   normal; normal  Thought Process:  Coherent and Tangential  Though Content:  WNL; not suicidal and not homicidal  Orientation:   person, place and time/date  Judgment:   Fair  Planning:   Poor  Affect:    Flat  Mood:    Dysphoric  Insight:   Fair  Intelligence:   normal  Medical History:   Past Medical History:  Diagnosis Date  . Anemia    hx of  . Arthritis   . Dislocation of right knee with medial meniscus tear   . Headache    hx of migraines  none since 1990's  . Hypothyroidism    1978, no problems since  . Pneumonia 1990's none since  . PVC (premature ventricular contraction)    199'0's none since  . SAH (subarachnoid hemorrhage) (HCC) 2016   On the right   Psychiatric History:  Patient denies past psychiatric history.    Family Med/Psych History:  Family History  Problem Relation Age of Onset  . Stroke Mother   . Heart attack Father     Risk of Suicide/Violence: virtually non-existent Patient denies SI or HI.  Impression/DX:  Connie Oconnor is a 78 year old female with past medical history of SAH after fall in 2018, hypothyroidism, tobacco abuse, back surgery.  Ambulated with rolling walker since back surgery.  Presented on 02/20/2019 with acute left-sided weakness.  MRI showed small acute infarct in right corona radiata.  MRA showed 7 mm aneurysm in right MCA bifurcation.  Aneurysm will be coiled later on outpatient basis through IR.    Patient has some confusion and focus on back/leg pain.  Patient somewhat despondent and stating the "I am 78 and falling apart.  Patient was able to give general medical history but was not clear on some of details that she should know.  Patient reports that she is coping as well as she can but that with each medical events she gets more frustrated with loss of function.  She is most concerned about back pain and limitations from that.    Disposition/Plan:  Worked on coping and adjustment issues.  Diagnosis:    Lacunar infarct, acute [facility]) - Plan: Ambulatory referral to Neurology         Electronically Signed   _______________________ Arley Phenix, Psy.D.

## 2019-02-28 NOTE — Progress Notes (Signed)
Physical Therapy Session Note  Patient Details  Name: Connie Oconnor MRN: [id number] Date of Birth: 1941-03-22  Today's Date: 02/28/2019 PT Individual Time: 1015-1130 PT Individual Time Calculation (min): 75 min   Short Term Goals: Week 1:  PT Short Term Goal 1 (Week 1): Patient to be able to maintain dynamic seated balance with no more than min guard  PT Short Term Goal 2 (Week 1): Patient to be able to perform sit to stand/stand pivot  transfers with minA and LRAD consistently  PT Short Term Goal 3 (Week 1): Patient to initiate gait training  PT Short Term Goal 4 (Week 1): Patient to be able to maintain dynamic standing balance with MinA   Skilled Therapeutic Interventions/Progress Updates:     Patient in bed upon PT arrival. Patient alert and agreeable to PT session. Patient had spilled coffee in the bed and reported being incontinent of bladder at beginning of session.   Therapeutic Activity: Bed Mobility: Patient rolled R and L x2 with min A and use of bed rail for doffing/donning brief and peri-care with total A. Cues provided for rolling technique and use of bed rails. Performed bridging in the bed x3 to don pants with max A. Performed supine to/from sit with mod A rolling to L side-lying before pushing to sit up. Provided verbal cues for rolling technique, use of bed rails, and pushing through R UE to sit. Transfers: Patient performed a stand pivot transfer x2 with mod A without an AD and sit to/from stand x3 in front of a mirror with mod-min A with RW. Provided verbal cues for erect posture, looking up, hand placement for pushing up form the w/c with R UE, and reaching back before sitting. Patient sitting in w/c with L trunk lean and poor trunk support. Attempted to find patient a L UE table for her w/c during session. None available at this time, will continue to locate one for patient for improved sitting posture.    Neuromuscular Re-ed: Patient performed sitting balance on EOB  for 4 minutes x2. First trial for midline orientation, leans to the L, with tactile cues to L ribs and scapula to elongate L trunk. Pt used R UE to pull up straight using bed rail, redirected to place B UE on bed, but returned to leaning L. During second trial patient doffed and donned her shirt with max A from PT while sitting EOB with min A for trunk support.  Patient stood in front of a mirror and worked on midline orientation and upright posture in standing. Has significant forward trunk lean, states she did this prior to her stroke, but it is a little worse. Able to tolerate standing for 30 seconds-1 min before asking to sit due to fatigue. During last trial performed weight shifts between both feet for pre-gait activity.   Patient in bed at end of session with breaks locked, bed alarm set, and all needs within reach.   Therapy Documentation Precautions:  Precautions Precautions: Fall Precaution Comments: L hemi, back pain, needs time to initiate movements  Restrictions Weight Bearing Restrictions: No Pain: Pain Assessment Pain Scale: 0-10 Pain Score: 0-No pain    Therapy/Group: Individual Therapy  Braeden Dolinski L Karen Huhta PT, DPT  02/28/2019, 1:01 PM

## 2019-02-28 NOTE — Patient Care Conference (Signed)
Inpatient RehabilitationTeam Conference and Plan of Care Update Date: 02/28/2019   Time: 2:20 PM    Patient Name: Connie Oconnor      Medical Record Number: [id number]  Date of Birth: 14-Apr-1941 Sex: Female         Room/Bed: 4M05C/4M05C-01 Payor Info: Payor: [employer] MEDICARE / Plan: [employer] MEDICARE / Product Type: *No Product type* /    Admitting Diagnosis: CVA  Admit Date/Time:  02/23/2019  5:02 PM Admission Comments: No comment available   Primary Diagnosis:  <principal problem not specified> Principal Problem: <principal problem not specified>  Patient Active Problem List   Diagnosis Date Noted  . Hyperglycemia   . Hypotension   . Hypoalbuminemia due to protein-calorie malnutrition (HCC)   . Acute blood loss anemia   . Hypoglycemia   . Essential hypertension   . History of traumatic brain injury   . Lacunar infarct, acute (HCC) 02/23/2019  . 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 Hypothyroidism (acquired) 02/20/2019  . Closed fracture of medial portion of right tibial plateau 01/11/2018  . Acute medial meniscal tear, right, initial encounter 01/11/2018  . SAH (subarachnoid hemorrhage) (HCC) 03/29/2017    Expected Discharge Date: Expected Discharge Date: 03/17/19  Team Members Present: Physician leading conference: Dr. 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    Improve mobility, safety, blood pressure, hyperglycemia  See above   Bowel/Bladder              Swallow/Nutrition/ Hydration             ADL's   bathing-mod A; UB dressing-mod A; LB dressing-max A; toilet transfers-mod/max A; toileting-max A; L inattention; improved LUE AROM max A for functional use; limited awareness of deficits  supervision overall  functional transfers, sitting balance, BADL retraining, LUE NMR. increased awareness, educaiton   Mobility   bed mobility min-mod A; stand piviot and sit<>stand transfers mod A with RW  Supervision for mobility, gait 100' with LRAD, mod I w/c mobility 150'  strengthening, balance, midline orientation-leans to the L, posture, functional mobiltiy, w/c mobility   Communication             Safety/Cognition/ Behavioral Observations            Pain   c/o pain in right hip radiating to knee and back; PRN tramadol q8hrs  <3/10 pain   assess pain qshift and prn    Skin   no current skin issues  remain free of skin breakdown/infection  assess qshift and prn       *See Care Plan and progress notes for long and short-term goals.     Barriers to Discharge  Current Status/Progress Possible Resolutions Date Resolved   Physician    Medical stability;Decreased caregiver support;Lack of/limited family support     See above  Therapies, optimize BP meds, follow CBGs      Nursing                  PT  Decreased caregiver support;Incontinence  Pt will need 24/7 supervision  and potential for physical assist upon d/c, unsure if patient has this available.              OT                  SLP                SW Decreased caregiver support Daughter's to come up with a plan for caring for pt            Discharge Planning/Teaching Needs:  Daughter's coming up with a plan for discharge. Made aware pt will need 24 hr care at DC from CIR. Pt wanting to go home but realizes not ready yet      Team Discussion:  Goals supervision overall. Currently mod-max level of assist. Lacks awareness/insight and has Left inattention, developing tone in her left  arm. MD adjusting BP meds. Neuro-psych seeing today for coping. Pt wanting to go home. Will need 24 hr care at discharge.   Revisions to Treatment Plan:  DC 5/16    Continued Need for Acute Rehabilitation Level of Care: The patient requires daily medical management by a physician with specialized training in physical medicine and rehabilitation for the following conditions: Daily direction of a multidisciplinary physical rehabilitation program to ensure safe treatment while eliciting the highest outcome that is of practical value to the patient.: Yes Daily medical management of patient stability for increased activity during participation in an intensive rehabilitation regime.: Yes Daily analysis of laboratory values and/or radiology reports with any subsequent need for medication adjustment of medical intervention for : Neurological problems;Diabetes problems;Blood pressure problems   I attest that I was present, lead the team conference, and concur with the assessment and plan of the team.   Lucy Chrisupree, Lashia G 02/28/2019, 3:57 PM

## 2019-02-28 NOTE — Progress Notes (Signed)
Speech Language Pathology Daily Session Note  Patient Details  Name: Connie Oconnor MRN: [id number] Date of Birth: November 17, 1940  Today's Date: 02/28/2019 SLP Individual Time: 1430-1500 SLP Individual Time Calculation (min): 30 min  Short Term Goals: Week 1: SLP Short Term Goal 1 (Week 1): Patient will demonstrate functional problem solving for mildly complex tasks with Mod A verbal cues.  SLP Short Term Goal 2 (Week 1): Patient will demonstrate sustained attention to functional tasks for 15 minutes with Min A verbal cues for redirection.  SLP Short Term Goal 3 (Week 1): Patient will recall new, daily information with Min A multimodal cues.  SLP Short Term Goal 4 (Week 1): Patient will utilize word-finding strategies at the conversation level with Mod I.  SLP Short Term Goal 5 (Week 1): Pt will tolerate mechanical soft diet and thin liquids without obvious oral issues, overt s/s aspiration, or decline in respiratory status.  Skilled Therapeutic Interventions:  Skilled treatment session focused on cognition goals. SLP facilitated session by hand over hand to scan to left of environment to locate snack items (cup, drink). Pt consumed thin liquids with no overt s/s of aspiration. Pt required Mod A cues for sustained attention to task for ~ 5 minute intervals d/t fatigue. Attempted to have pt sort cards according to color (Blink cards) with Max A faded to Mod A cues.      Pain    Therapy/Group: Individual Therapy  Connie Oconnor 02/28/2019, 3:08 PM

## 2019-03-01 ENCOUNTER — Inpatient Hospital Stay (HOSPITAL_COMMUNITY): Payer: Medicare Other | Admitting: Physical Therapy

## 2019-03-01 ENCOUNTER — Inpatient Hospital Stay (HOSPITAL_COMMUNITY): Payer: Medicare Other

## 2019-03-01 ENCOUNTER — Inpatient Hospital Stay (HOSPITAL_COMMUNITY): Payer: Medicare Other | Admitting: Speech Pathology

## 2019-03-01 DIAGNOSIS — R0989 Other specified symptoms and signs involving the circulatory and respiratory systems: Secondary | ICD-10-CM

## 2019-03-01 LAB — CBC
HCT: 32.5 % — ABNORMAL LOW (ref 36.0–46.0)
Hemoglobin: 11.2 g/dL — ABNORMAL LOW (ref 12.0–15.0)
MCH: 28.9 pg (ref 26.0–34.0)
MCHC: 34.5 g/dL (ref 30.0–36.0)
MCV: 83.8 fL (ref 80.0–100.0)
Platelets: 347 10*3/uL (ref 150–400)
RBC: 3.88 MIL/uL (ref 3.87–5.11)
RDW: 12.7 % (ref 11.5–15.5)
WBC: 8.5 10*3/uL (ref 4.0–10.5)
nRBC: 0 % (ref 0.0–0.2)

## 2019-03-01 NOTE — Progress Notes (Signed)
[facility] PHYSICAL MEDICINE & REHABILITATION PROGRESS NOTE   Subjective/Complaints: Patient seen laying in bed this morning.  She states he slept fairly overnight.  She denies complaints.  ROS: Denies CP SOB N/V/D  Objective:   No results found. No results for input(s): WBC, HGB, HCT, PLT in the last 72 hours. No results for input(s): NA, K, CL, CO2, GLUCOSE, BUN, CREATININE, CALCIUM in the last 72 hours.  Intake/Output Summary (Last 24 hours) at 03/01/2019 0917 Last data filed at 02/28/2019 1842 Gross per 24 hour  Intake 180 ml  Output -  Net 180 ml     Physical Exam: Vital Signs Blood pressure 133/77, pulse 77, temperature 98.2 F (36.8 C), temperature source Oral, resp. rate 16, height 5\' 5"  (1.651 m), weight 68.5 kg, SpO2 100 %. Constitutional: No distress . Vital signs reviewed. HENT: Normocephalic.  Atraumatic. Eyes: EOMI.  No discharge. Cardiovascular: No JVD. Respiratory: Normal effort. GI: Non-distended. Musc: No edema or tenderness in extremities. Neurologic: Alert and oriented Motor:  Left upper extremity: 4-/5 proximal to distal, stable Left lower extremity: 4 +/5 proximal to distal stable Emerging tone Skin: Warm and dry.  Intact.  Assessment/Plan: 1. Functional deficits secondary to right corona radiata infarct which require 3+ hours per day of interdisciplinary therapy in a comprehensive inpatient rehab setting.  Physiatrist is providing close team supervision and 24 hour management of active medical problems listed below.  Physiatrist and rehab team continue to assess barriers to discharge/monitor patient progress toward functional and medical goals  Care Tool:  Bathing  Bathing activity did not occur: Refused Body parts bathed by patient: Left arm, Abdomen, Chest, Front perineal area, Right upper leg, Left upper leg, Face   Body parts bathed by helper: Right arm, Buttocks, Right lower leg, Left lower leg     Bathing assist Assist Level: Moderate  Assistance - Patient 50 - 74%     Upper Body Dressing/Undressing Upper body dressing   What is the patient wearing?: Pull over shirt    Upper body assist Assist Level: Moderate Assistance - Patient 50 - 74%    Lower Body Dressing/Undressing Lower body dressing      What is the patient wearing?: Pants, Incontinence brief     Lower body assist Assist for lower body dressing: Maximal Assistance - Patient 25 - 49%     Toileting Toileting    Toileting assist Assist for toileting: Maximal Assistance - Patient 25 - 49%     Transfers Chair/bed transfer  Transfers assist     Chair/bed transfer assist level: Moderate Assistance - Patient 50 - 74%     Locomotion Ambulation   Ambulation assist   Ambulation activity did not occur: Safety/medical concerns(fatigue/pain )  Assist level: Moderate Assistance - Patient 50 - 74% Assistive device: Walker-rolling(and L hand splint) Max distance: 5'   Walk 10 feet activity   Assist  Walk 10 feet activity did not occur: Safety/medical concerns(fatigue/pain )        Walk 50 feet activity   Assist Walk 50 feet with 2 turns activity did not occur: Safety/medical concerns(fatigue/pain )         Walk 150 feet activity   Assist Walk 150 feet activity did not occur: Safety/medical concerns(fatigue/pain )         Walk 10 feet on uneven surface  activity   Assist Walk 10 feet on uneven surfaces activity did not occur: Safety/medical concerns(fatigue/pain )         Wheelchair  Assist Will patient use wheelchair at discharge?: Yes Type of Wheelchair: Manual    Wheelchair assist level: Supervision/Verbal cueing Max wheelchair distance: 65'    Wheelchair 50 feet with 2 turns activity    Assist    Wheelchair 50 feet with 2 turns activity did not occur: Safety/medical concerns(fatigue/pain )       Wheelchair 150 feet activity     Assist Wheelchair 150 feet activity did not occur:  Safety/medical concerns(fatigue/pain )        Medical Problem List and Plan: 1.Left side weaknesssecondary to 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  Cont CIR Plan outpatient coiling of aneurysm per interventional radiology  Will consider anti-spasticity medications if necessary 2. Antithrombotics: -DVT/anticoagulation:SCDs. -antiplatelet therapy: aspirin 81 mg daily Plavix 75 mg daily 3 weeks and Plavix alone 3. Pain Management/migraine headaches:Tramadolasneeded Scheduled Flexeril (home med) for right hand cramps/back Added kpad for low back 4. Mood:Provide emotional support -antipsychotic agents: N/A 5. Neuropsych: This patientis capable of making decisions on herown behalf. 6. Skin/Wound Care:Routine skin checks 7. Fluids/Electrolytes/Nutrition:Routine in and outs 8. History of traumatic [facility] May 2018. Patient seen in the past by Dr. Conchita Paris 9. Hypertension.  Vitals:   02/28/19 1950 03/01/19 0447  BP: (!) 128/58 133/77  Pulse: 75 77  Resp: 16 16  Temp: 98.9 F (37.2 C) 98.2 F (36.8 C)  SpO2: 95% 100%   Norvasc 10 mg daily, decreased to 5 mg on 4/26  Labile diastolic pressures on 4/30 10. Hyperlipidemia. Lipitor 11. Tobacco abuse. Counseling 12.  Hyperglycemia  Hemoglobin A1c 5.5 on 01/2019  P.o. intake remains inconsistent on 4/30  Continue to monitor with increased activity 13.  Acute blood loss anemia  Hemoglobin 11.7 on 4/27  Continue to monitor 14.  Hypoalbuminemia  Supplement initiated on 4/27    LOS: 6 days A FACE TO FACE EVALUATION WAS PERFORMED  Moncia Annas Karis Juba 03/01/2019, 9:17 AM

## 2019-03-01 NOTE — Progress Notes (Addendum)
Speech Language Pathology Daily Session Note  Patient Details  Name: Connie Oconnor MRN: [id number] Date of Birth: Mar 14, 1941  Today's Date: 03/01/2019 SLP Individual Time: 2440-1027 SLP Individual Time Calculation (min): 45 min  Short Term Goals: Week 1: SLP Short Term Goal 1 (Week 1): Patient will demonstrate functional problem solving for mildly complex tasks with Mod A verbal cues.  SLP Short Term Goal 2 (Week 1): Patient will demonstrate sustained attention to functional tasks for 15 minutes with Min A verbal cues for redirection.  SLP Short Term Goal 3 (Week 1): Patient will recall new, daily information with Min A multimodal cues.  SLP Short Term Goal 4 (Week 1): Patient will utilize word-finding strategies at the conversation level with Mod I.  SLP Short Term Goal 5 (Week 1): Pt will tolerate mechanical soft diet and thin liquids without obvious oral issues, overt s/s aspiration, or decline in respiratory status.  Skilled Therapeutic Interventions: Pt was seen for skilled ST intervention targeting goals for improved cognitive functioning. SLP facilitated session by providing simple maze task that was initiated earlier this week. This task requires attention to detail, planning, and problem solving. Pt required visual cues with halfway point marked on the maze to be able to complete drawing the trail successfully. Pt did well writing with nondominant (right) hand. In quiet environment, Pt exhibited good attention to task initially, but as she fatigued, cues were required to encourage pt to keep her eyes open and continue working. No obvious word finding deficits noted during simple conversation with pt, or when RN talked with pt during this session. No overt s/s aspiration observed when pt took meds with water, given by RN.  Pt was left in bed with alarm on, all needs within reach. Continue per current plan of care.   Pain Pain Assessment Pain Scale: 0-10 Pain Score: 0-No pain Faces  Pain Scale: Hurts even more Pain Type: Chronic pain Pain Location: Back Pain Orientation: Lower;Medial Pain Descriptors / Indicators: Discomfort Pain Onset: On-going Patients Stated Pain Goal: 0 Pain Intervention(s): Repositioned;Distraction;Emotional support Multiple Pain Sites: No  Therapy/Group: Individual Therapy  Celia B. Murvin Natal, [facility], CCC-SLP Speech Language Pathologist  Leigh Aurora 03/01/2019, 1:05 PM

## 2019-03-01 NOTE — Progress Notes (Signed)
Social Work Patient ID: Connie Oconnor, female   DOB: [birth year], 78 y.o.   MRN: [id number] Spoke with Cindy-daughter via telephone to discuss team conference goals supervision level and target discharge date 5/16. Daughter reports they can not provide 24 hr care she is a single parent and her sister lives an hour away and works also. She will talk with her sister and see if she can provide assist. She wanted to know the options she has. Pt wants to go home and doesn't see her deficits or safety issues. Will let them all discuss the plan for discharge and see if could provide 24 hr care. Will continue to work on discharge plans.

## 2019-03-01 NOTE — Progress Notes (Signed)
Occupational Therapy Session Note  Patient Details  Name: Connie Oconnor MRN: [id number] Date of Birth: [birth year]  Today's Date: 03/01/2019 OT Individual Time: 0737-1062 OT Individual Time Calculation (min): 55 min    Short Term Goals: Week 1:  OT Short Term Goal 1 (Week 1): Pt will consistently transfer to toilet/BSC wiht MIN A OT Short Term Goal 2 (Week 1): Pt will initiate bathing body parts with min VC OT Short Term Goal 3 (Week 1): pt will thread BUE into shirt wiht S OT Short Term Goal 4 (Week 1): Pt will recall hemi techniques iwht min VC OT Short Term Goal 5 (Week 1): Pt will thread 1LE into pants wiht VC  Skilled Therapeutic Interventions/Progress Updates:    Pt asleep in bed upon arrival and easily aroused.  Pt agreeable to sitting EOB and transferring to w/c.  Pt required min A for supine>sit EOB.  Pt c/o "excrutiating pain" in R inguinal when sitting EOB and unable to bear weight through R hip.  Pt returned to supine with HOB elevated.  RN and PA notified.  Pt continued with therapy while in bed with focus on LUE AROM/PROM.  Pt required min A for shoulder flexion to 90 degrees and able to maintain without assistance.  OT intervention with focus on LUE tasks activities to increase pt's awareness of LUE and engaged in functional tasks.  Pt remained in bed with all needs within reach and bed alarm activated.   Therapy Documentation Precautions:  Precautions Precautions: Fall Precaution Comments: L hemi, back pain, needs time to initiate movements  Restrictions Weight Bearing Restrictions: No Pain:  Pt c/o 10/10 pain in R inguinal with activity and weight bearing; repositioned; RN and PA aware   Therapy/Group: Individual Therapy  Rich Brave 03/01/2019, 11:34 AM

## 2019-03-01 NOTE — Progress Notes (Signed)
Physical Therapy Session Note  Patient Details  Name: Connie Oconnor MRN: [id number] Date of Birth: 10-04-1941  Today's Date: 03/01/2019 PT Individual Time: 0801-0833 PT Individual Time Calculation (min): 32 min   Short Term Goals: Week 1:  PT Short Term Goal 1 (Week 1): Patient to be able to maintain dynamic seated balance with no more than min guard  PT Short Term Goal 2 (Week 1): Patient to be able to perform sit to stand/stand pivot  transfers with minA and LRAD consistently  PT Short Term Goal 3 (Week 1): Patient to initiate gait training  PT Short Term Goal 4 (Week 1): Patient to be able to maintain dynamic standing balance with MinA   Skilled Therapeutic Interventions/Progress Updates:    Patient received in bed, sleeping but easily woken and willing to participate in PT session today, intermittently agitated at therapist during session and requiring redirection to reduce agitation. Donned TEDS in bed with totalA, threaded LEs through pants and attempted bridging before patient became agitated and stated "I NEED TO PEE!". Doffed pants with totalA and performed supine to sit with ModA, she then required Min-ModA to maintain sitting balance at EOB. Performed stand-pivot transfer to [facility] with MaxA and additional stand with ModA and RW to doff brief. Once finished urinating, threaded LEs through brief and paper pants, then performed sit to stand with MinA with RW and donned brief/clothing with totalA. Performed second stand-pivot transfer to [facility] with Payne. She was left up in [facility] with seat belt alarm active, UE tray in place ,and all needs otherwise met this morning.   Therapy Documentation Precautions:  Precautions Precautions: Fall Precaution Comments: L hemi, back pain, needs time to initiate movements  Restrictions Weight Bearing Restrictions: No Pain: Pain Assessment Pain Scale: Faces Faces Pain Scale: Hurts even more Pain Type: Chronic pain Pain Location: Back Pain Orientation:  Lower;Medial Pain Descriptors / Indicators: Discomfort Pain Onset: On-going Patients Stated Pain Goal: 0 Pain Intervention(s): Repositioned;Distraction;Emotional support Multiple Pain Sites: No    Therapy/Group: Individual Therapy   Deniece Ree PT, DPT, CBIS  Supplemental Physical Therapist [facility]    Pager [phone] Acute Rehab Office [phone]    03/01/2019, 12:12 PM

## 2019-03-01 NOTE — Progress Notes (Signed)
Recreational Therapy Session Note  Patient Details  Name: Connie Oconnor MRN: [id number] Date of Birth: [birth year] Today's Date: 03/01/2019   Skilled Therapeutic Interventions/Progress Updates: Met with pt briefly on 4/29 to discuss TR services and pt stated limited interest as she describes herself as sedentary at home.  No further TR at this time.  Will continue to monitor through team for future participation.  [place] 03/01/2019, 8:37 AM

## 2019-03-01 NOTE — Progress Notes (Signed)
Physical Therapy Session Note  Patient Details  Name: Connie Oconnor MRN: [id number] Date of Birth: 20-May-1941  Today's Date: 03/01/2019 PT Individual Time:   3748-2707 PT Individual Time Calculation (min): 51 min  Pt missed 9 minu due to late meal.  Short Term Goals: Week 1:  PT Short Term Goal 1 (Week 1): Patient to be able to maintain dynamic seated balance with no more than min guard  PT Short Term Goal 2 (Week 1): Patient to be able to perform sit to stand/stand pivot  transfers with minA and LRAD consistently  PT Short Term Goal 3 (Week 1): Patient to initiate gait training  PT Short Term Goal 4 (Week 1): Patient to be able to maintain dynamic standing balance with MinA      Skilled Therapeutic Interventions/Progress Updates:   Session started late as pt had just gotten her tray.  Pt resting in bed.  She stated pain R groin is 8/10.  Ed, RN notified.  Area of femoral artery procedure is indurated and tender; mild bruising.  Pt stated that she was urinating in her brief.  Rolling L><R using railing and cues, and partial bridging for doffing wet brief and donning dry one.  Pt performed peri care with set up.  L side lying> sitting with min assist, using rail.  Pt sat EOB x 30 seconds, with cues for midline orientation, before stating she could not tolerate the pain.  Min assist to lie down in L side lying again.  neuromuscular re-education via multimodal cues for 10 x 1 each.L/R side lying for R/L hip abduction with hips and knees flexed.  Supine for 10 x 1 bil pelvic tilt ( pt unable to elevate hips at all, at this point).  Bed mobility training for rolling without use of rails, min assist and mod cues for turning head, sequencing.  Pt stated that her husband previously had a hospital bed, which is now at her brother's.  PT urged her to continue to try to improve in moving without use of rails.  Pt left resting in R side lying with pillows for positioning.  Bed alarm set and needs at  hand       Therapy Documentation Precautions:  Precautions Precautions: Fall Precaution Comments: L hemi, back pain, needs time to initiate movements  Restrictions Weight Bearing Restrictions: No General: PT Amount of Missed Time (min): 9 Minutes PT Missed Treatment Reason: Other (Comment)(late meal)  Pain: Pain Assessment Pain Scale: 0-10 8/10 back and R groin; premedicated       Therapy/Group: Individual Therapy  Connie Oconnor 03/01/2019, 4:18 PM

## 2019-03-02 ENCOUNTER — Inpatient Hospital Stay (HOSPITAL_COMMUNITY): Payer: Medicare Other | Admitting: Occupational Therapy

## 2019-03-02 ENCOUNTER — Inpatient Hospital Stay (HOSPITAL_COMMUNITY): Payer: Medicare Other

## 2019-03-02 ENCOUNTER — Inpatient Hospital Stay (HOSPITAL_COMMUNITY): Payer: Medicare Other | Admitting: Physical Therapy

## 2019-03-02 ENCOUNTER — Inpatient Hospital Stay (HOSPITAL_COMMUNITY): Payer: Medicare Other | Admitting: Speech Pathology

## 2019-03-02 DIAGNOSIS — I69391 Dysphagia following cerebral infarction: Secondary | ICD-10-CM

## 2019-03-02 MED ORDER — ADULT MULTIVITAMIN W/MINERALS CH
1.0000 | ORAL_TABLET | Freq: Every day | ORAL | Status: DC
  Administered 2019-03-03 – 2019-03-27 (×25): 1 via ORAL
  Filled 2019-03-02 (×25): qty 1

## 2019-03-02 NOTE — Progress Notes (Signed)
[name]   Subjective/Complaints: Patient seen laying in bed this morning.  She states she slept well overnight.  States she has right leg pain this a.m. and recently asked for medications.  She notes improvement in strength.  Overnight per nursing, patient had an unwitnessed fall trying to go to the bathroom.  Tele-sitter ordered.  ROS: Denies CP SOB N/V/D  Objective:   No results found. Recent Labs    03/01/19 1854  WBC 8.5  HGB 11.2*  HCT 32.5*  PLT 347   No results for input(s): NA, K, CL, CO2, GLUCOSE, BUN, CREATININE, CALCIUM in the last 72 hours.  Intake/Output Summary (Last 24 hours) at 03/02/2019 0949 Last data filed at 03/02/2019 0745 Gross per 24 hour  Intake 600 ml  Output -  Net 600 ml     Physical Exam: Vital Signs Blood pressure (!) 150/70, pulse 81, temperature 99.2 F (37.3 C), temperature source Oral, resp. rate 15, height 5\' 5"  (1.651 m), weight 68.5 kg, SpO2 96 %. Constitutional: No distress . Vital signs reviewed. HENT: Normocephalic.  Atraumatic. Eyes: EOMI.  No discharge. Cardiovascular: No JVD. Respiratory: Normal effort. GI: Non-distended. Musc: No edema or tenderness in extremities. Neurologic: Alert and oriented Motor:  Left upper extremity: Shoulder abduction 3/5, distally 4/5 Left lower extremity: 4 +/5 proximal to distal, unchanged Emerging tone Skin: Warm and dry.  Intact.  Assessment/Plan: 1. Functional deficits secondary to right corona radiata infarct which require 3+ hours per day of interdisciplinary therapy in a comprehensive inpatient rehab setting.  Physiatrist is providing close team supervision and 24 hour management of active medical problems listed below.  Physiatrist and rehab team continue to assess barriers to discharge/monitor patient progress toward functional and medical goals  Care Tool:  Bathing  Bathing activity did not occur: Refused Body parts bathed by  patient: Left arm, Abdomen, Chest, Front perineal area, Right upper leg, Left upper leg, Face   Body parts bathed by helper: Right arm, Buttocks, Right lower leg, Left lower leg     Bathing assist Assist Level: Moderate Assistance - Patient 50 - 74%     Upper Body Dressing/Undressing Upper body dressing   What is the patient wearing?: Pull over shirt    Upper body assist Assist Level: Moderate Assistance - Patient 50 - 74%    Lower Body Dressing/Undressing Lower body dressing      What is the patient wearing?: Pants, Incontinence brief     Lower body assist Assist for lower body dressing: Maximal Assistance - Patient 25 - 49%     Toileting Toileting    Toileting assist Assist for toileting: Maximal Assistance - Patient 25 - 49%     Transfers Chair/bed transfer  Transfers assist     Chair/bed transfer assist level: Maximal Assistance - Patient 25 - 49%     Locomotion Ambulation   Ambulation assist   Ambulation activity did not occur: Safety/medical concerns(fatigue/pain )  Assist level: Moderate Assistance - Patient 50 - 74% Assistive device: Walker-rolling(and L hand splint) Max distance: 5'   Walk 10 feet activity   Assist  Walk 10 feet activity did not occur: Safety/medical concerns(fatigue/pain )        Walk 50 feet activity   Assist Walk 50 feet with 2 turns activity did not occur: Safety/medical concerns(fatigue/pain )         Walk 150 feet activity   Assist Walk 150 feet activity did not occur: Safety/medical concerns(fatigue/pain )  Walk 10 feet on uneven surface  activity   Assist Walk 10 feet on uneven surfaces activity did not occur: Safety/medical concerns(fatigue/pain )         Wheelchair     Assist Will patient use wheelchair at discharge?: Yes Type of Wheelchair: Manual    Wheelchair assist level: Supervision/Verbal cueing Max wheelchair distance: 3615'    Wheelchair 50 feet with 2 turns  activity    Assist    Wheelchair 50 feet with 2 turns activity did not occur: Safety/medical concerns(fatigue/pain )       Wheelchair 150 feet activity     Assist Wheelchair 150 feet activity did not occur: Safety/medical concerns(fatigue/pain )        Medical Problem List and Plan: 1.Left side weaknesssecondary to 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  Cont CIR Plan outpatient coiling of aneurysm per interventional radiology  Will consider anti-spasticity medications if necessary 2. Antithrombotics: -DVT/anticoagulation:SCDs. -antiplatelet therapy: aspirin 81 mg daily Plavix 75 mg daily 3 weeks and Plavix alone 3. Pain Management/migraine headaches:Tramadolasneeded Scheduled Flexeril (home med) for right hand cramps/back Added kpad for low back 4. Mood:Provide emotional support -antipsychotic agents: N/A 5. Neuropsych: This patientis capable of making decisions on herown behalf.  Continue tele-sitter for safety 6. Skin/Wound Care:Routine skin checks 7. Fluids/Electrolytes/Nutrition:Routine in and outs  D3 thins, advance diet as tolerated 8. [facility]. Patient seen in the past by Dr. Conchita ParisNundkumar 9. Hypertension.  Vitals:   03/02/19 0619 03/02/19 0843  BP: (!) 151/68 (!) 150/70  Pulse: 81   Resp: 15   Temp: 99.2 F (37.3 C)   SpO2: 96%    Norvasc 10 mg daily, decreased to 5 mg on 4/26  Extremely labile on 5/1, will need to monitor in accordance with fall and with therapies 10. Hyperlipidemia. Lipitor 11. Tobacco abuse. Counseling 12.  Hyperglycemia  Hemoglobin A1c 5.5 on 01/2019  P.o. intake remains inconsistent on 5/1  Continue to monitor with increased activity 13.  Acute blood loss anemia  Hemoglobin 11.2 on 4/30, labs ordered  for Monday  Continue to monitor 14.  Hypoalbuminemia  Supplement initiated on 4/27   LOS: 7 days A FACE TO FACE EVALUATION WAS PERFORMED  Connie Oconnor Connie Oconnor Connie Oconnor 03/02/2019, 9:49 AM

## 2019-03-02 NOTE — Progress Notes (Signed)
Speech Language Pathology Weekly Progress and Session Note  Patient Details  Name: Jeanene Mena MRN: [id number] Date of Birth: [birth year]  Beginning of progress report period: February 23, 2019 End of progress report period: Mar 02, 2019  Today's Date: 03/02/2019 SLP Individual Time: 1000-1030 SLP Individual Time Calculation (min): 30 min  Short Term Goals: Week 1: SLP Short Term Goal 1 (Week 1): Patient will demonstrate functional problem solving for mildly complex tasks with Mod A verbal cues.  SLP Short Term Goal 1 - Progress (Week 1): Not met SLP Short Term Goal 2 (Week 1): Patient will demonstrate sustained attention to functional tasks for 15 minutes with Min A verbal cues for redirection.  SLP Short Term Goal 2 - Progress (Week 1): Not met SLP Short Term Goal 3 (Week 1): Patient will recall new, daily information with Min A multimodal cues.  SLP Short Term Goal 3 - Progress (Week 1): Not met SLP Short Term Goal 4 (Week 1): Patient will utilize word-finding strategies at the conversation level with Mod I.  SLP Short Term Goal 4 - Progress (Week 1): Met SLP Short Term Goal 5 (Week 1): Pt will tolerate mechanical soft diet and thin liquids without obvious oral issues, overt s/s aspiration, or decline in respiratory status. SLP Short Term Goal 5 - Progress (Week 1): Met    New Short Term Goals: Week 2: SLP Short Term Goal 1 (Week 2): Patient will demonstrate functional problem solving for basic tasks with Min A verbal cues.  SLP Short Term Goal 2 (Week 2): Patient will demonstrate sustained attention to functional tasks for 15 minutes with Mod A verbal cues for redirection.  SLP Short Term Goal 3 (Week 2): Patient will recall new, daily information with Min A multimodal cues.   Weekly Progress Updates: Patient has made functional gains and has met 2 of 5 STGs this reporting period. Currently, patient is consuming Dys. 3 textures with thin liquids without overt s/s of aspiration with  overall Mod I. Patient is also overall Mod I for word-finding at the conversation level. However, minimal cognitive gains have been made and patient requires overall Mod-Max A verbal and visual cues for functional problem solving, attention and recall. Patient and family education ongoing. Patient would benefit from continued skilled SLP services to maximize her cognitive functioning prior to discharge.      Intensity: Minumum of 1-2 x/day, 30 to 90 minutes Frequency: 3 to 5 out of 7 days Duration/Length of Stay: 03/17/19 Treatment/Interventions: Cognitive remediation/compensation;Environmental controls;Internal/external aids;Speech/Language facilitation;Therapeutic Activities;Patient/family education;Functional tasks;Cueing hierarchy;Dysphagia/aspiration precaution training   Daily Session  Skilled Therapeutic Interventions: Skilled treatment session focused on cognitive goals. SLP facilitated session by providing Mod-Max A verbal and visual cues for functional problem solving during a basic money management task. Patient's function impacted by both internal and external distractions requiring overall Max A verbal cues for sustained attention. Patient left upright in wheelchair with alarm on and all needs within reach. Continue with current plan of care.      Pain No/Denies Pain   Therapy/Group: Individual Therapy  Autumm Hattery 03/02/2019, 6:38 AM

## 2019-03-02 NOTE — Progress Notes (Signed)
Nutrition Follow-up  RD working remotely.  DOCUMENTATION CODES:   Not applicable, will assess for malnutrition at follow-up after completion of NPFE  INTERVENTION:   - Continue Ensure Enlive po BID, each supplement provides 350 kcal and 20 grams of protein  - Add Magic Cup TID with meals, each supplement provides 290 kcal and 9 grams of protein  - Add MVI with minerals daily  - Encourage adequate PO intake  NUTRITION DIAGNOSIS:   Increased nutrient needs related to acute illness as evidenced by estimated needs.  Ongoing, being addressed via oral nutrition supplements  GOAL:   Patient will meet greater than or equal to 90% of their needs  Progressing  MONITOR:   PO intake, Supplement acceptance, Labs, Weight trends, Skin  REASON FOR ASSESSMENT:   Malnutrition Screening Tool    ASSESSMENT:   78 y.o. female with history of PVC, hypothyroidism, arthritis, migraines presenting with L sided weakness to [place] on 4/21. MRI small R CR infarct, 62mm R MCA aneurysm. Admitted to [facility] on 4/24.   No new weights since 4/25. Recommend obtaining new weight to better monitor trends.  Spoke with pt via phone call to room. Pt reports that her appetite is not great but that "I'm trying." Pt states, "It's not your fault, it's mine." RD provided encouragement to pt.  Pt reports she is drinking her Ensure Enlive oral nutrition supplements daily (at least 1 full supplement per pt). RD encouraged pt to order foods that she likes from the menu and to do that best that she can at mealtimes. Pt states, "You don't worry about me. I'll be okay."  RD to add Magic Cup to meal trays to aid in meeting kcal and protein needs. Will also add MVI with minerals daily.  If poor PO intake continues, pt is at risk for acute malnutrition especially given recent weight loss. Will assess for malnutrition at follow-up after completion of NFPE.  Meal Completion: 0-80% x last 8 recorded meals  (extremely varied, averaging 42%)  Medications reviewed and include: Ensure Enlive BID  Labs reviewed.  Diet Order:   Diet Order            DIET DYS 3 Room service appropriate? Yes; Fluid consistency: Thin  Diet effective now              EDUCATION NEEDS:   Not appropriate for education at this time  Skin:  Skin Assessment: Reviewed RN Assessment  Last BM:  02/28/19  Height:   Ht Readings from Last 1 Encounters:  02/23/19 5\' 5"  (1.651 m)    Weight:   Wt Readings from Last 1 Encounters:  02/24/19 68.5 kg    Ideal Body Weight:     BMI:  Body mass index is 25.13 kg/m.  Estimated Nutritional Needs:   Kcal:  1700-1900 kcals   Protein:  80-90 g  Fluid:  >/= 1.7 L    Earma Reading, MS, RD, LDN Inpatient Clinical Dietitian Pager: [phone] Weekend/After Hours: [phone]

## 2019-03-02 NOTE — Progress Notes (Signed)
Occupational Therapy Session Note  Patient Details  Name: Connie Oconnor MRN: [id number] Date of Birth: 1940-11-27  Today's Date: 03/02/2019 OT Individual Time: 5537-4827 OT Individual Time Calculation (min): 45 min  and Today's Date: 03/02/2019 OT Missed Time: 15 Minutes Missed Time Reason: Pain   Short Term Goals: Week 2:  OT Short Term Goal 1 (Week 2): Pt will consistently transfer to toilet/BSC wiht MIN A OT Short Term Goal 2 (Week 2): pt will thread BUE into shirt wiht S OT Short Term Goal 3 (Week 2): Pt will recall hemi techniques iwht min VC OT Short Term Goal 4 (Week 2): Pt will thread 1LE into pants wiht VC  Skilled Therapeutic Interventions/Progress Updates:    Pt resting in w/c upon arrival with half lap tray in place.  Pt initially engaged in LUE reaching/grasping tasks with focus on attention to LUE and initiating functional activity.  Pt with shoulder AROM ~100 degrees. Pt requires increased time to initiate task after command.  Pt c/o increased pain in R hip while seated and requested to return to bed.  Pt performed stand pivot transfer with mod A.  Pt required max A for sit>supine in bed.  Pt able to assist with repositioning in bed.  Pt engaged in LUE AROM exercises while supine with focus on increased shoulder flexion and horizontal adduction. Pt remained in bed with bed alarm activated and lowered to lowest height, floor mat in place, and all needs within reach. Pt missed 15 mins skilled OT services 2/2 pain.  Therapy Documentation Precautions:  Precautions Precautions: Fall Precaution Comments: L hemi, back pain, needs time to initiate movements  Restrictions Weight Bearing Restrictions: No General: General OT Amount of Missed Time: 15 Minutes Pain: Pain Assessment Pain Scale: 0-10 Pain Score: 8  Pain Location: Hip Pain Orientation: Right Pain Descriptors / Indicators: Aching Pain Frequency: Constant Pain Onset: On-going Pain Intervention(s): Repositioned  (returned to bed);RN made aware   Therapy/Group: Individual Therapy  Rich Brave 03/02/2019, 11:40 AM

## 2019-03-02 NOTE — Progress Notes (Signed)
Social Work Patient ID: Connie Oconnor, female   DOB: [birth year], 78 y.o.   MRN: [id number] Message left from daughter-[name] who wants a call from PA or MD regarding her Mom's medical care and questions about medications. Have let Dan-PA know and gave him the number for the daughter.

## 2019-03-02 NOTE — Progress Notes (Signed)
Occupational Therapy Session Note  Patient Details  Name: Connie Oconnor MRN: [id number] Date of Birth: August 08, 1941  Today's Date: 03/02/2019 OT Individual Time: 6712-4580 OT Individual Time Calculation (min): 45 min    Short Term Goals: Week 2:  OT Short Term Goal 1 (Week 2): Pt will consistently transfer to toilet/BSC wiht MIN A OT Short Term Goal 2 (Week 2): pt will thread BUE into shirt wiht S OT Short Term Goal 3 (Week 2): Pt will recall hemi techniques iwht min VC OT Short Term Goal 4 (Week 2): Pt will thread 1LE into pants wiht VC  Skilled Therapeutic Interventions/Progress Updates:    Patient in bed upon arrival.  Denies falling this am.  LB bathing and dressing completed at bed level due to right hip/groin pain.  Max A for bathing and dressing LB  - patient able to roll side to side with min a.  Supine to SSP with mod A.  Sit to stand and SPT bed to w/c with mod A - cues for hand placement and weight shifts.  UB bathing and dressing seated in w/c at sink with mod A.  Max A for oral care due to difficulty with holding and manipulating dentures in left hand.   Hair combing mod A.  Patient remained in w/c at close of session with seat belt alarm set and tele-sitter in place.  Therapy Documentation Precautions:  Precautions Precautions: Fall Precaution Comments: L hemi, back pain, needs time to initiate movements  Restrictions Weight Bearing Restrictions: No General:   Vital Signs: Therapy Vitals BP: (!) 150/70 Pain: Pain Assessment Pain Scale: 0-10 Pain Score: 7  Pain Location: Hip Pain Orientation: Right Pain Descriptors / Indicators: Aching Pain Intervention(s): Repositioned;RN made aware   Other Treatments:     Therapy/Group: Individual Therapy  Barrie Lyme 03/02/2019, 10:55 AM

## 2019-03-02 NOTE — Progress Notes (Signed)
   03/02/19 0400  What Happened  Was fall witnessed? No  Was patient injured? No  Patient found on floor;other (Comment) (knees on floor )  Found by Staff-comment  Stated prior activity ambulating-unassisted  Adult Fall Risk Assessment  Risk Factor Category (scoring not indicated) Fall has occurred during this admission (document High fall risk)  Patient Fall Risk Level High fall risk  Adult Fall Risk Interventions  Required Bundle Interventions *See Row Information* High fall risk - low, moderate, and high requirements implemented  Additional Interventions Use of appropriate toileting equipment (bedpan, BSC, etc.)  Screening for Fall Injury Risk (To be completed on HIGH fall risk patients) - Assessing Need for Low Bed  Risk For Fall Injury- Low Bed Criteria None identified - Continue screening  Screening for Fall Injury Risk (To be completed on HIGH fall risk patients who do not meet crieteria for Low Bed) - Assessing Need for Floor Mats Only  Risk For Fall Injury- Criteria for Floor Mats Bleeding risk-anticoagulation (not prophylaxis)  Will Implement Floor Mats Yes (in place)  Neurological  Neuro (WDL) X  Level of Consciousness Alert  Orientation Level Oriented to person;Disoriented to place;Oriented to situation;Disoriented to time  Cognition Appropriate at baseline  Speech Clear  Musculoskeletal  Musculoskeletal (WDL) X  Assistive Device BSC;Wheelchair  Generalized Weakness Yes  Weight Bearing Restrictions No  Integumentary  Integumentary (WDL) X  Skin Color Red;Other (Comment) (right groin)

## 2019-03-02 NOTE — Progress Notes (Signed)
Occupational Therapy Session Note  Patient Details  Name: Connie Oconnor MRN: [id number] Date of Birth: 1941/07/30  Today's Date: 03/02/2019 OT Individual Time: 1330-1400 OT Individual Time Calculation (min): 30 min    Short Term Goals: Week 2:  OT Short Term Goal 1 (Week 2): Pt will consistently transfer to toilet/BSC wiht MIN A OT Short Term Goal 2 (Week 2): pt will thread BUE into shirt wiht S OT Short Term Goal 3 (Week 2): Pt will recall hemi techniques iwht min VC OT Short Term Goal 4 (Week 2): Pt will thread 1LE into pants wiht VC  Skilled Therapeutic Interventions/Progress Updates:    Patient in bed completing lunch upon arrival.   She has a flat affect but is agreeable to therapy and states that her hip/groin pain is improved.  Bed mobility with mod A.  SPT bed to w/c with mod/max A - increased lean to left and return to sitting position on edge of bed this afternoon.  Patient completed holding and scooping tasks with focus on left hand mobility and dexterity - provided foam to build up handle - she requires tactile cues and HOH to complete scooping and spoon to mouth with left hand. Reach and grasp activity completed with slow rate but patient able to extend digits on left and hold cylinder object.  Patient remained in w/c with seat belt alarm set and tele-sitter   Therapy Documentation Precautions:  Precautions Precautions: Fall Precaution Comments: L hemi, back pain, needs time to initiate movements  Restrictions Weight Bearing Restrictions: No General: General OT Amount of Missed Time: 15 Minutes Vital Signs:  Pain: Pain Assessment Pain Scale: 0-10 Pain Score: 0-No pain Pain Location: Hip Pain Orientation: Right Pain Descriptors / Indicators: Aching Pain Intervention(s): Repositioned;RN made aware   Other Treatments:     Therapy/Group: Individual Therapy  Barrie Lyme 03/02/2019, 2:13 PM

## 2019-03-02 NOTE — Progress Notes (Signed)
Occupational Therapy Weekly Progress Note  Patient Details  Name: Connie Oconnor MRN: [id number] Date of Birth: Jan 19, 1941  Beginning of progress report period: February 24, 2019 End of progress report period: Mar 02, 2019  Patient has met 1 of 4 short term goals.  Pt progress has been minimal since admission.  R inguinal pain continues to be limiting factor.  Pt requires max verbal cues to initiate and attend to functional tasks.  Pt requires mod A/max A for stand pivot transfers with max verbal cues for sequencing.  Pt has exhibited increased LUE AROM and functional use but requires max verbal cues to attend and initiate.  Pt continues to insist that she can go home and her 31 year old granddaughter can help her.  Pt has limited awareness of her deficits.  Patient continues to demonstrate the following deficits: impaired timing and sequencing, unbalanced muscle activation, motor apraxia, decreased coordination and decreased motor planning, decreased midline orientation and decreased attention to left, decreased initiation, decreased problem solving, decreased safety awareness, decreased memory and delayed processing and decreased sitting balance, decreased standing balance, hemiplegia and decreased balance strategies and therefore will continue to benefit from skilled OT intervention to enhance overall performance with BADL, iADL and Reduce care partner burden.  Patient progressing toward long term goals..  Continue plan of care.  OT Short Term Goals Week 1:  OT Short Term Goal 1 (Week 1): Pt will consistently transfer to toilet/BSC wiht MIN A OT Short Term Goal 1 - Progress (Week 1): Progressing toward goal OT Short Term Goal 2 (Week 1): Pt will initiate bathing body parts with min VC OT Short Term Goal 2 - Progress (Week 1): Met OT Short Term Goal 3 (Week 1): pt will thread BUE into shirt wiht S OT Short Term Goal 3 - Progress (Week 1): Progressing toward goal OT Short Term Goal 4 (Week 1): Pt  will recall hemi techniques iwht min VC OT Short Term Goal 4 - Progress (Week 1): Progressing toward goal OT Short Term Goal 5 (Week 1): Pt will thread 1LE into pants wiht VC OT Short Term Goal 5 - Progress (Week 1): Progressing toward goal Week 2:  OT Short Term Goal 1 (Week 2): Pt will consistently transfer to toilet/BSC wiht MIN A OT Short Term Goal 2 (Week 2): pt will thread BUE into shirt wiht S OT Short Term Goal 3 (Week 2): Pt will recall hemi techniques iwht min VC OT Short Term Goal 4 (Week 2): Pt will thread 1LE into pants wiht VC   Leroy Libman 03/02/2019, 6:53 AM

## 2019-03-03 ENCOUNTER — Inpatient Hospital Stay (HOSPITAL_COMMUNITY): Payer: Medicare Other | Admitting: Speech Pathology

## 2019-03-03 ENCOUNTER — Inpatient Hospital Stay (HOSPITAL_COMMUNITY): Payer: Medicare Other | Admitting: Occupational Therapy

## 2019-03-03 DIAGNOSIS — R4587 Impulsiveness: Secondary | ICD-10-CM

## 2019-03-03 NOTE — Progress Notes (Signed)
Speech Language Pathology Daily Session Note  Patient Details  Name: Connie Oconnor MRN: [id number] Date of Birth: February 19, 1941  Today's Date: 03/03/2019 SLP Individual Time: 1300-1340 SLP Individual Time Calculation (min): 40 min  Short Term Goals: Week 2: SLP Short Term Goal 1 (Week 2): Patient will demonstrate functional problem solving for basic tasks with Min A verbal cues.  SLP Short Term Goal 2 (Week 2): Patient will demonstrate sustained attention to functional tasks for 15 minutes with Mod A verbal cues for redirection.  SLP Short Term Goal 3 (Week 2): Patient will recall new, daily information with Min A multimodal cues.   Skilled Therapeutic Interventions: Skilled treatment session focused on dysphagia and cognitive goals. Upon arrival, patient was consuming her lunch meal of Dys. 3 textures with thin liquids.  Patient reported difficulty masticating the hamburger due to the dry meat and had to eventually orally expectorate it from her oral cavity. However, during mastication, patient did have her dentures out and lying on her tray and declined to put them back in for the remainder of the meal. Throughout meal, patient required Max A verbal cues for attention and problem solving with self-feeding. Patient would not ask for assistance and would perseverate on tasks that were not efficient or effective. Because of this, patient only ate ~3 bites of food within 30 minutes. Patient left upright in bed with alarm on and all needs within reach. Continue with current plan of care.      Pain No/Denies Pain   Therapy/Group: Individual Therapy  Yalda Herd 03/03/2019, 2:16 PM

## 2019-03-03 NOTE — Progress Notes (Signed)
[place] PHYSICAL MEDICINE & REHABILITATION PROGRESS NOTE   Subjective/Complaints: Patient seen laying in bed this morning.  She states she slept fairly overnight due to not being able to get comfortable.  ROS: Denies CP SOB N/V/D  Objective:   No results found. Recent Labs    03/01/19 1854  WBC 8.5  HGB 11.2*  HCT 32.5*  PLT 347   No results for input(s): NA, K, CL, CO2, GLUCOSE, BUN, CREATININE, CALCIUM in the last 72 hours.  Intake/Output Summary (Last 24 hours) at 03/03/2019 1303 Last data filed at 03/03/2019 5400 Gross per 24 hour  Intake 578 ml  Output -  Net 578 ml     Physical Exam: Vital Signs Blood pressure 140/61, pulse 86, temperature 98.6 F (37 C), temperature source Oral, resp. rate 20, height 5\' 5"  (1.651 m), weight 68.5 kg, SpO2 96 %. Constitutional: No distress . Vital signs reviewed. HENT: Normocephalic.  Atraumatic. Eyes: EOMI.  No discharge. Cardiovascular: No JVD. Respiratory: Normal effort. GI: Non-distended. Musc: No edema or tenderness in extremities. Neurologic: Alert and oriented Motor:  Left upper extremity: Shoulder abduction 3/5, distally 4/5, stable Left lower extremity: 4 +/5 proximal to distal, stable Emerging tone Skin: Warm and dry.  Intact.  Assessment/Plan: 1. Functional deficits secondary to right corona radiata infarct which require 3+ hours per day of interdisciplinary therapy in a comprehensive inpatient rehab setting.  Physiatrist is providing close team supervision and 24 hour management of active medical problems listed below.  Physiatrist and rehab team continue to assess barriers to discharge/monitor patient progress toward functional and medical goals  Care Tool:  Bathing  Bathing activity did not occur: Refused Body parts bathed by patient: Left arm, Abdomen, Chest, Front perineal area, Right upper leg, Left upper leg, Face   Body parts bathed by helper: Right arm, Buttocks, Right lower leg, Left lower leg      Bathing assist Assist Level: Moderate Assistance - Patient 50 - 74%     Upper Body Dressing/Undressing Upper body dressing   What is the patient wearing?: Pull over shirt    Upper body assist Assist Level: Moderate Assistance - Patient 50 - 74%    Lower Body Dressing/Undressing Lower body dressing      What is the patient wearing?: Pants, Incontinence brief     Lower body assist Assist for lower body dressing: Maximal Assistance - Patient 25 - 49%     Toileting Toileting    Toileting assist Assist for toileting: Maximal Assistance - Patient 25 - 49%     Transfers Chair/bed transfer  Transfers assist     Chair/bed transfer assist level: Maximal Assistance - Patient 25 - 49%     Locomotion Ambulation   Ambulation assist   Ambulation activity did not occur: Safety/medical concerns(fatigue/pain )  Assist level: Moderate Assistance - Patient 50 - 74% Assistive device: Walker-rolling(and L hand splint) Max distance: 5'   Walk 10 feet activity   Assist  Walk 10 feet activity did not occur: Safety/medical concerns(fatigue/pain )        Walk 50 feet activity   Assist Walk 50 feet with 2 turns activity did not occur: Safety/medical concerns(fatigue/pain )         Walk 150 feet activity   Assist Walk 150 feet activity did not occur: Safety/medical concerns(fatigue/pain )         Walk 10 feet on uneven surface  activity   Assist Walk 10 feet on uneven surfaces activity did not occur: Safety/medical concerns(fatigue/pain )  Wheelchair     Assist Will patient use wheelchair at discharge?: Yes Type of Wheelchair: Manual    Wheelchair assist level: Supervision/Verbal cueing Max wheelchair distance: 15'    Wheelchair 50 feet with 2 turns activity    Assist    Wheelchair 50 feet with 2 turns activity did not occur: Safety/medical concerns(fatigue/pain )       Wheelchair 150 feet activity     Assist Wheelchair 150  feet activity did not occur: Safety/medical concerns(fatigue/pain )        Medical Problem List and Plan: 1.Left side weaknesssecondary to 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  Cont CIR Plan outpatient coiling of aneurysm per interventional radiology  Will consider anti-spasticity medications if necessary 2. Antithrombotics: -DVT/anticoagulation:SCDs. -antiplatelet therapy: aspirin 81 mg daily Plavix 75 mg daily 3 weeks and Plavix alone 3. Pain Management/migraine headaches:Tramadolasneeded Scheduled Flexeril (home med) for right hand cramps/back Added kpad for low back 4. Mood:Provide emotional support -antipsychotic agents: N/A 5. Neuropsych: This patientis capable of making decisions on herown behalf.  Continue tele-sitter for safety 6. Skin/Wound Care:Routine skin checks 7. Fluids/Electrolytes/Nutrition:Routine in and outs  D3 thins, advance diet as tolerated 8. [facility] Patient seen in the past by Dr. Conchita ParisNundkumar 9. Hypertension.  Vitals:   03/02/19 1951 03/03/19 0410  BP: (!) 146/69 140/61  Pulse: 79 86  Resp: 20 20  Temp: 98.7 F (37.1 C) 98.6 F (37 C)  SpO2: 92% 96%   Norvasc 10 mg daily, decreased to 5 mg on 4/26  Remains elevated, but?  Improving on 5/2 10. Hyperlipidemia. Lipitor 11. Tobacco abuse. Counseling 12.  Hyperglycemia  Hemoglobin A1c 5.5 on 01/2019  P.o. intake remains inconsistent on 5/2  Continue to monitor with increased activity 13.  Acute blood loss anemia  Hemoglobin 11.2 on 4/30, labs ordered for Monday  Continue to monitor 14.  Hypoalbuminemia  Supplement initiated on 4/27   LOS: 8 days A FACE TO FACE EVALUATION WAS PERFORMED  Charle Clear Karis Jubanil Shayle Donahoo 03/03/2019, 1:03 PM

## 2019-03-03 NOTE — Plan of Care (Signed)
  Problem: RH BLADDER ELIMINATION Goal: RH STG MANAGE BLADDER WITH ASSISTANCE Description STG Manage Bladder With Assistance Min  Outcome: Not Progressing; incontinence   Problem: RH BOWEL ELIMINATION Goal: RH STG MANAGE BOWEL W/MEDICATION W/ASSISTANCE Description STG Manage Bowel with Medication with Assistance. min  Outcome: Not Progressing; constipation LBM 4/29

## 2019-03-03 NOTE — Progress Notes (Signed)
Occupational Therapy Session Note  Patient Details  Name: Connie Oconnor MRN: [id number] Date of Birth: 1941-10-14  Today's Date: 03/03/2019 OT Individual Time: 1655-3748 OT Individual Time Calculation (min): 26 min   Short Term Goals: Week 2:  OT Short Term Goal 1 (Week 2): Pt will consistently transfer to toilet/BSC wiht MIN A OT Short Term Goal 2 (Week 2): pt will thread BUE into shirt wiht S OT Short Term Goal 3 (Week 2): Pt will recall hemi techniques iwht min VC OT Short Term Goal 4 (Week 2): Pt will thread 1LE into pants wiht VC  Skilled Therapeutic Interventions/Progress Updates:    Pt greeted in bed, refusing to get OOB or participate in most self care tasks suggested. She was agreeable to apply lotion to UEs. OT provided her with sanitizer for hand hygiene, and she required active assist to incorporate Lt during task. Active assist for using L UE as gross stabilizer/assist while opening bottle and applying lotion to Rt hand and forearm. Vcs also required for sustained attention to task, as pt exhibited external distractibility. Active assist for B incorporation of L UE when drinking water and also when stabilizing phone during a short phone call. Pt was then repositioned in bed for comfort and improved alignment due to Lt lean. She was left in bed with all needs within reach and bed alarm set. Tx focus placed on Lt NMR and Lt attention.   Therapy Documentation Precautions:  Precautions Precautions: Fall Precaution Comments: L hemi, back pain, needs time to initiate movements  Restrictions Weight Bearing Restrictions: No Vital Signs: Therapy Vitals Temp: 99.3 F (37.4 C) Temp Source: Oral Pulse Rate: 78 Resp: 19 BP: 99/89 Patient Position (if appropriate): Sitting Oxygen Therapy SpO2: 98 % O2 Device: Room Air Pain: Pt reported no pain. She stated feeling surprised about this because she "always has back pain."   ADL:        Therapy/Group: Individual  Therapy  Juelz Claar A Nahshon Reich 03/03/2019, 4:58 PM

## 2019-03-04 ENCOUNTER — Inpatient Hospital Stay (HOSPITAL_COMMUNITY): Payer: Medicare Other

## 2019-03-04 MED ORDER — POLYETHYLENE GLYCOL 3350 17 G PO PACK
17.0000 g | PACK | Freq: Two times a day (BID) | ORAL | Status: DC
  Administered 2019-03-04 – 2019-03-24 (×30): 17 g via ORAL
  Filled 2019-03-04 (×37): qty 1
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
## 2019-03-04 NOTE — Plan of Care (Signed)
  Problem: RH BOWEL ELIMINATION Goal: RH STG MANAGE BOWEL W/MEDICATION W/ASSISTANCE Description STG Manage Bowel with Medication with Assistance. min  Outcome: Not Progressing; LBM 4/29; MD notified laxatives given; poor appetite Problem: RH BLADDER ELIMINATION Goal: RH STG MANAGE BLADDER WITH ASSISTANCE Description STG Manage Bladder With Assistance Min  Outcome: Not Progressing; incontinence Problem: RH SAFETY Goal: RH STG ADHERE TO SAFETY PRECAUTIONS W/ASSISTANCE/DEVICE Description STG Adhere to Safety Precautions With Assistance/Device. Min  Outcome: Not Progressing; on telemonitor due to fall

## 2019-03-04 NOTE — Progress Notes (Signed)
Physical Therapy Weekly Progress Note  Patient Details  Name: Connie Oconnor MRN: [id number] Date of Birth: 1941/01/01  Beginning of progress report period:  End of progress report period: 03/04/19  Today's Date: 03/04/2019 PT Individual Time: 1030-1135 PT Individual Time Calculation (min): 65 min   Patient has met 0 of 4 short term goals.  She partially met sitting balance goal.  Pt is most limited recently by R groin pain, which has steadily increased since admission to CIR.  She has tenderness and pain with movement at procedural site.   Patient continues to demonstrate the following deficits muscle joint tightness, decreased cardiorespiratoy endurance, impaired timing and sequencing and decreased motor planning, decreased attention, decreased awareness, decreased problem solving, decreased memory and delayed processing and decreased sitting balance, decreased standing balance, decreased postural control, hemiplegia and decreased balance strategies and therefore will continue to benefit from skilled PT intervention to increase functional independence with mobility.  Patient progressing toward long term goals..  Continue plan of care.  PT Short Term Goals Week 1:  PT Short Term Goal 1 (Week 1): Patient to be able to maintain dynamic seated balance with no more than min guard  PT Short Term Goal 1 - Progress (Week 1): Partly met PT Short Term Goal 2 (Week 1): Patient to be able to perform sit to stand/stand pivot  transfers with minA and LRAD consistently  PT Short Term Goal 2 - Progress (Week 1): Not met PT Short Term Goal 3 (Week 1): Patient to initiate gait training  PT Short Term Goal 3 - Progress (Week 1): Not met PT Short Term Goal 4 (Week 1): Patient to be able to maintain dynamic standing balance with MinA  PT Short Term Goal 4 - Progress (Week 1): Not met Week 2:     Skilled Therapeutic Interventions/Progress Updates:   Pt resting in bed.  She stated that her R groin area is  still sore and tender.  She did not rate pain, but grimaced and rolled to L often.  PT noted that indurated area is larger today than 4/30 , date of previous appt with this PT.  Pt also having R hamstring spasms with any movement; slightly decreased with gentle hip PROM ABD/adduction with flexed knee ( small excursions).   PT notified Marjorie Smolder, RN who examined area indurated area at R groin.   Pt unable to tolerate sitting up or OOB this session due to pain.  Using hand- out and multimodal cues, neuro re-ed for L straight leg raises, R small arc knee extension, bil ankle circles, cervical flexion, heel slides.  Pt quite somnolent, falling asleep often during session.  Pt left resting in L side lying with pillows supporting RLE and keeping heel off of bed.  Bed alarm set and needs left at hand.  Plan to check with PA regarding possible use of K pad to address tender site.         Therapy Documentation Precautions:  Precautions Precautions: Fall Precaution Comments: L hemi, back pain, needs time to initiate movements  Restrictions Weight Bearing Restrictions: No General:   Vital Signs: Therapy Vitals Temp: 98.9 F (37.2 C) Temp Source: Oral Pulse Rate: 78 Resp: 18 BP: (!) 135/48 Patient Position (if appropriate): Lying Oxygen Therapy SpO2: 98 % O2 Device: Room Air Pain:   Vision/Perception     Mobility:   Locomotion :    Trunk/Postural Assessment :    Balance:   Exercises:   Other Treatments:     Therapy/Group: Individual  Therapy  Alano Blasco 03/04/2019, 4:32 PM

## 2019-03-05 ENCOUNTER — Inpatient Hospital Stay (HOSPITAL_COMMUNITY): Payer: Medicare Other | Admitting: Occupational Therapy

## 2019-03-05 ENCOUNTER — Inpatient Hospital Stay (HOSPITAL_COMMUNITY): Payer: Medicare Other

## 2019-03-05 ENCOUNTER — Inpatient Hospital Stay (HOSPITAL_COMMUNITY): Payer: Medicare Other | Admitting: Speech Pathology

## 2019-03-05 DIAGNOSIS — K5901 Slow transit constipation: Secondary | ICD-10-CM

## 2019-03-05 DIAGNOSIS — M79662 Pain in left lower leg: Secondary | ICD-10-CM

## 2019-03-05 DIAGNOSIS — I82402 Acute embolism and thrombosis of unspecified deep veins of left lower extremity: Secondary | ICD-10-CM

## 2019-03-05 LAB — CBC WITH DIFFERENTIAL/PLATELET
Abs Immature Granulocytes: 0.08 10*3/uL — ABNORMAL HIGH (ref 0.00–0.07)
Basophils Absolute: 0 10*3/uL (ref 0.0–0.1)
Basophils Relative: 0 %
Eosinophils Absolute: 0 10*3/uL (ref 0.0–0.5)
Eosinophils Relative: 0 %
HCT: 33.1 % — ABNORMAL LOW (ref 36.0–46.0)
Hemoglobin: 11.2 g/dL — ABNORMAL LOW (ref 12.0–15.0)
Immature Granulocytes: 1 %
Lymphocytes Relative: 4 %
Lymphs Abs: 0.6 10*3/uL — ABNORMAL LOW (ref 0.7–4.0)
MCH: 27.9 pg (ref 26.0–34.0)
MCHC: 33.8 g/dL (ref 30.0–36.0)
MCV: 82.5 fL (ref 80.0–100.0)
Monocytes Absolute: 1.6 10*3/uL — ABNORMAL HIGH (ref 0.1–1.0)
Monocytes Relative: 12 %
Neutro Abs: 11.1 10*3/uL — ABNORMAL HIGH (ref 1.7–7.7)
Neutrophils Relative %: 83 %
Platelets: 522 10*3/uL — ABNORMAL HIGH (ref 150–400)
RBC: 4.01 MIL/uL (ref 3.87–5.11)
RDW: 12.9 % (ref 11.5–15.5)
WBC: 13.4 10*3/uL — ABNORMAL HIGH (ref 4.0–10.5)
nRBC: 0 % (ref 0.0–0.2)

## 2019-03-05 MED ORDER — SENNOSIDES-DOCUSATE SODIUM 8.6-50 MG PO TABS
2.0000 | ORAL_TABLET | Freq: Every day | ORAL | Status: DC
  Administered 2019-03-05 – 2019-03-17 (×13): 2 via ORAL
  Filled 2019-03-05 (×17): qty 2

## 2019-03-05 NOTE — Progress Notes (Signed)
Occupational Therapy Session Note  Patient Details  Name: Connie Oconnor MRN: [id number] Date of Birth: Jun 14, 1941  Today's Date: 03/05/2019 OT Individual Time: 1638-4665 OT Individual Time Calculation (min): 28 min   Short Term Goals: Week 2:  OT Short Term Goal 1 (Week 2): Pt will consistently transfer to toilet/BSC wiht MIN A OT Short Term Goal 2 (Week 2): pt will thread BUE into shirt wiht S OT Short Term Goal 3 (Week 2): Pt will recall hemi techniques iwht min VC OT Short Term Goal 4 (Week 2): Pt will thread 1LE into pants wiht VC  Skilled Therapeutic Interventions/Progress Updates:    Pt greeted in w/c, agreeable to complete grooming tasks at the sink. Worked on [employer] and Lt visual field scanning when cleaning dentures, washing hands, and combing hair. She required max vcs and increased time to locate needed items placed on Lt side of sink. L UE facilitated as gross stabilizer to hold dentures while pt used toothbrush to clean them. Cues for sequencing required at this time, and also during handwashing. Due to hypertonicity, OT assisted with extending Lt elbow so that her hand could reach the water. At end of session pt remained in the w/c with safety belt fastened and half lap tray. Turned on the TV and encouraged pt to watch the program to promote cervical and thoracic extension for back pain relief.    Therapy Documentation Precautions:  Precautions Precautions: Fall Precaution Comments: L hemi, back pain, needs time to initiate movements  Restrictions Weight Bearing Restrictions: No Pain: Back pain. RN made aware.  Pain Assessment Faces Pain Scale: Hurts whole lot(pt unable to rate pain) Pain Type: Acute pain Pain Location: Groin Pain Orientation: Right Pain Descriptors / Indicators: Sharp Pain Onset: With Activity Pain Intervention(s): Repositioned ADL:       Therapy/Group: Individual Therapy  Uyen Eichholz A Tattianna Schnarr 03/05/2019, 12:36 PM

## 2019-03-05 NOTE — Progress Notes (Signed)
Occupational Therapy Session Note  Patient Details  Name: Connie Oconnor MRN: [id number] Date of Birth: 16-May-1941  Today's Date: 03/05/2019 OT Individual Time: 4098-1191 OT Individual Time Calculation (min): 75 min    Short Term Goals: Week 2:  OT Short Term Goal 1 (Week 2): Pt will consistently transfer to toilet/BSC wiht MIN A OT Short Term Goal 2 (Week 2): pt will thread BUE into shirt wiht S OT Short Term Goal 3 (Week 2): Pt will recall hemi techniques iwht min VC OT Short Term Goal 4 (Week 2): Pt will thread 1LE into pants wiht VC  Skilled Therapeutic Interventions/Progress Updates:    Upon arrival in room, pt in bed in a bright mood showing me how much more she can move her left arm.  Pt was worried about how much her R leg was going to hurt with movement, so she said she wanted to scoot to EOB herself. Pt was able to go about 90% of the way herself and then needed A to fully bring hips forward as she was afraid of moving R hip.  At EOB,pt leaning L and had her place R hand on foot board to counterbalance.   It took pt a few trials, but she was able to push to stand to pivot to w/c with mod A.   Pt taken to bathroom and pivoted to [facility] over toilet with mod A using R hand on bar.  She rose to stand for therapist to doff her brief. Pt did void urine and self cleansed.   Mod A stand pivot onto tub bench. Once pt was on bench she had great difficulty with her posture, leaning forward.  Used verbal and strong tactile cues to have pt lean back on tub bench back support. She would do that for a second or too and then lean forward again. Pt bathed with mod A and mod A to maintain sit balance as she kept leaning forward.  Max A st pivot back to w/c.  Once in w/c, pt kept leaning forward and could not maintain upright sitting.  Donned shirt with max A due to balance, LB total A with +2 support when pt had to stand.   NT applied belt alarm, ;ap board and pillows in front of pt to avoid forward lean.    telesitter on.   Therapy Documentation Precautions:  Precautions Precautions: Fall Precaution Comments: L hemi, back pain, needs time to initiate movements  Restrictions Weight Bearing Restrictions: No     Pain: Pain Assessment Faces Pain Scale: Hurts whole lot(pt unable to rate pain) Pain Type: Acute pain Pain Location: Groin Pain Orientation: Right Pain Descriptors / Indicators: Sharp Pain Onset: With Activity Pain Intervention(s): Repositioned   Therapy/Group: Individual Therapy  Taray Normoyle 03/05/2019, 10:47 AM

## 2019-03-05 NOTE — Progress Notes (Deleted)
Physical Therapy Session Note  Patient Details  Name: Jonique Kulig MRN: [id number] Date of Birth: Sep 02, 1941  Today's Date: 03/05/2019 PT Individual Time: 1505-1600 PT Individual Time Calculation (min): 55 min   Short Term Goals: Week 1:  PT Short Term Goal 1 (Week 1): Patient to be able to maintain dynamic seated balance with no more than min guard  PT Short Term Goal 1 - Progress (Week 1): Partly met PT Short Term Goal 2 (Week 1): Patient to be able to perform sit to stand/stand pivot  transfers with minA and LRAD consistently  PT Short Term Goal 2 - Progress (Week 1): Not met PT Short Term Goal 3 (Week 1): Patient to initiate gait training  PT Short Term Goal 3 - Progress (Week 1): Not met PT Short Term Goal 4 (Week 1): Patient to be able to maintain dynamic standing balance with MinA  PT Short Term Goal 4 - Progress (Week 1): Not met  Skilled Therapeutic Interventions/Progress Updates:         Therapy Documentation Precautions:  Precautions Precautions: Fall Precaution Comments: L hemi, back pain, needs time to initiate movements  Restrictions Weight Bearing Restrictions: No General:   Vital Signs: Therapy Vitals Temp: 99 F (37.2 C) Temp Source: Oral Pulse Rate: 88 Resp: 18 BP: (!) 148/61 Patient Position (if appropriate): Lying Oxygen Therapy SpO2: 96 % Pain:   Mobility:   Locomotion :    Trunk/Postural Assessment :    Balance:   Exercises:   Other Treatments:      Therapy/Group: Individual Therapy  Paydon Carll 03/05/2019, 3:41 PM

## 2019-03-05 NOTE — Progress Notes (Addendum)
[facility] PHYSICAL MEDICINE & REHABILITATION PROGRESS NOTE   Subjective/Complaints: Patient seen sitting up in bed this morning.  She states she slept well overnight.  States she has some abdominal pain.  Per nursing, patient complains of calf pain, when asked patient denies, however tender to palpation.  Patient noted to be constipated yesterday-discussed with nursing.  Discussed with therapies today weakness/motivation.  ROS: + Right lower quadrant pain.  Denies CP SOB N/V/D  Objective:   No results found. No results for input(s): WBC, HGB, HCT, PLT in the last 72 hours. No results for input(s): NA, K, CL, CO2, GLUCOSE, BUN, CREATININE, CALCIUM in the last 72 hours.  Intake/Output Summary (Last 24 hours) at 03/05/2019 0840 Last data filed at 03/05/2019 0835 Gross per 24 hour  Intake 966 ml  Output -  Net 966 ml     Physical Exam: Vital Signs Blood pressure (!) 143/64, pulse 78, temperature 98.5 F (36.9 C), temperature source Oral, resp. rate 16, height 5\' 5"  (1.651 m), weight 68.5 kg, SpO2 95 %. Constitutional: No distress . Vital signs reviewed. HENT: Normocephalic.  Atraumatic. Eyes: EOMI.  No discharge. Cardiovascular: No JVD. Respiratory: Normal effort. GI: Non-distended.  No tenderness to palpation in the right lower quadrant. Musc: + TTP left calf. Neurologic: Alert and oriented Motor:  Left upper extremity: Shoulder abduction 3/5, distally 4/5, stable Left lower extremity: 4 +/5 proximal to distal, unchanged Right lower extremity: 2/5 hip flexion, knee extension (?  Pain versus motivation) Emerging tone Skin: Warm and dry.  Intact.  Assessment/Plan: 1. Functional deficits secondary to right corona radiata infarct which require 3+ hours per day of interdisciplinary therapy in a comprehensive inpatient rehab setting.  Physiatrist is providing close team supervision and 24 hour management of active medical problems listed below.  Physiatrist and rehab team continue  to assess barriers to discharge/monitor patient progress toward functional and medical goals  Care Tool:  Bathing  Bathing activity did not occur: Refused Body parts bathed by patient: Left arm, Abdomen, Chest, Front perineal area, Right upper leg, Left upper leg, Face   Body parts bathed by helper: Right arm, Buttocks, Right lower leg, Left lower leg     Bathing assist Assist Level: Moderate Assistance - Patient 50 - 74%     Upper Body Dressing/Undressing Upper body dressing   What is the patient wearing?: Pull over shirt    Upper body assist Assist Level: Moderate Assistance - Patient 50 - 74%    Lower Body Dressing/Undressing Lower body dressing      What is the patient wearing?: Pants, Incontinence brief     Lower body assist Assist for lower body dressing: Maximal Assistance - Patient 25 - 49%     Toileting Toileting    Toileting assist Assist for toileting: Maximal Assistance - Patient 25 - 49%     Transfers Chair/bed transfer  Transfers assist     Chair/bed transfer assist level: Maximal Assistance - Patient 25 - 49%     Locomotion Ambulation   Ambulation assist   Ambulation activity did not occur: Safety/medical concerns(fatigue/pain )  Assist level: Moderate Assistance - Patient 50 - 74% Assistive device: Walker-rolling(and L hand splint) Max distance: 5'   Walk 10 feet activity   Assist  Walk 10 feet activity did not occur: Safety/medical concerns(fatigue/pain )        Walk 50 feet activity   Assist Walk 50 feet with 2 turns activity did not occur: Safety/medical concerns(fatigue/pain )  Walk 150 feet activity   Assist Walk 150 feet activity did not occur: Safety/medical concerns(fatigue/pain )         Walk 10 feet on uneven surface  activity   Assist Walk 10 feet on uneven surfaces activity did not occur: Safety/medical concerns(fatigue/pain )         Wheelchair     Assist Will patient use wheelchair  at discharge?: Yes Type of Wheelchair: Manual    Wheelchair assist level: Supervision/Verbal cueing Max wheelchair distance: 3115'    Wheelchair 50 feet with 2 turns activity    Assist    Wheelchair 50 feet with 2 turns activity did not occur: Safety/medical concerns(fatigue/pain )       Wheelchair 150 feet activity     Assist Wheelchair 150 feet activity did not occur: Safety/medical concerns(fatigue/pain )        Medical Problem List and Plan: 1.Left side weaknesssecondary to 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  Cont CIR Plan outpatient coiling of aneurysm per interventional radiology  Will consider anti-spasticity medications if necessary  Will continue to monitor right lower extremity weakness. 2. Antithrombotics: -DVT/anticoagulation:SCDs.  Lower extremity Dopplers ordered to rule out DVT -antiplatelet therapy: aspirin 81 mg daily Plavix 75 mg daily 3 weeks and Plavix alone 3. Pain Management/migraine headaches:Tramadolasneeded Scheduled Flexeril (home med) for right hand cramps/back Added kpad for low back 4. Mood:Provide emotional support -antipsychotic agents: N/A 5. Neuropsych: This patientis capable of making decisions on herown behalf.  Continue tele-sitter for safety  Needs significant encouragement 6. Skin/Wound Care:Routine skin checks 7. Fluids/Electrolytes/Nutrition:Routine in and outs  D3 thins, advance diet as tolerated 8. [facility] Patient seen in the past by Dr. Conchita ParisNundkumar 9. Hypertension.  Vitals:   03/05/19 0444 03/05/19 0445  BP: (!) 143/64   Pulse: 78   Resp: 16   Temp: 98.5 F (36.9 C)   SpO2: (!) 87% 95%   Norvasc 10 mg daily, decreased to 5 mg on 4/26  Relatively controlled on 5/4 10.  Hyperlipidemia. Lipitor 11. Tobacco abuse. Counseling 12.  Hyperglycemia  Hemoglobin A1c 5.5 on 01/2019  P.o. intake remains inconsistent on 5/4  Continue to monitor with increased activity 13.  Acute blood loss anemia  Hemoglobin 11.2 on 4/30, labs pending  Continue to monitor 14.  Hypoalbuminemia  Supplement initiated on 4/27 15.  Slow transit constipation  Bowel regimen increased on 5/3, increased again on 5/4  Likely source of right lower quadrant discomfort   LOS: 10 days A FACE TO FACE EVALUATION WAS PERFORMED  Cora Stetson Karis JubaAnil Skyley Grandmaison 03/05/2019, 8:40 AM

## 2019-03-05 NOTE — Progress Notes (Addendum)
Physical Therapy Session Note  Patient Details  Name: Connie Oconnor MRN: [id number] Date of Birth: Mar 08, 1941  Today's Date: 03/05/2019 PT Individual Time: 1505-1600 PT Individual Time Calculation (min): 55 min   Short Term Goals:  Week 2:  PT Short Term Goal 1 (Week 2): pt will perform stand pivot transfers with mod assist consistently PT Short Term Goal 2 (Week 2): pt will initiate gait training PT Short Term Goal 3 (Week 2): pt will balance in sitting wiht feet supported, reaching within BOS , wiht min assist  Skilled Therapeutic Interventions/Progress Updates:  Patient in bed upon PT arrival. Patient alert and agreeable to PT session.  She stated that it was April, and a Monday.  PT re-oriented pt to day, month and time.  She continues to c/o R groin pain at procedure site.  Bruising and tenderness noted.  This PT spoke with Dr. Allena Katz earlier today and he gave verbal OK for heat for pain relief.  PT placed hot pack on R thigh/groin area, with pt in supine, x 20 minutes.  Pt reported "it feels good; makes the pain a little better"    Pt reported she needed to use bedpan; last documented BM=  4/29, confirmed by LPN.  Elita Quick , PA wrote an order for K pad.  PT educated pt on asking for K pad for R groin pain, and that it needs to be addressed to increase pt's tolerance to OOB. Pt voiced understanding.  Therapeutic Activity: Bed Mobility: Patient rolled L with mod assist for placement of bed pan +2, using rails in flat bed.  Wheelchair Mobility:  18 x 18 tilt in space w/c placed in room; footrests shortened.  PT demonstrated to pt how this w/c worked, after she asked for a recliner.   Neuromuscular Re-ed: Patient performed 20 alternating ankle pumps in supine.   Patient in bed, on bed pan at end of session. Bed alarm set and needs at hand.  PT informed Helmut Muster, NT of pt's status.       Therapy Documentation Precautions:  Precautions Precautions: Fall Precaution Comments: L hemi,  back pain, needs time to initiate movements  Restrictions Weight Bearing Restrictions: No   Pain: "right much" R groin area.  Heat pack during session, and requested a K pad for daily use.          Therapy/Group: Individual Therapy  Connie Oconnor 03/05/2019, 3:50 PM

## 2019-03-05 NOTE — Progress Notes (Signed)
Speech Language Pathology Daily Session Note  Patient Details  Name: Connie Oconnor MRN: [id number] Date of Birth: [birth year]  Today's Date: 03/05/2019 SLP Individual Time: 1230-1330 SLP Individual Time Calculation (min): 60 min  Short Term Goals: Week 2: SLP Short Term Goal 1 (Week 2): Patient will demonstrate functional problem solving for basic tasks with Min A verbal cues.  SLP Short Term Goal 2 (Week 2): Patient will demonstrate sustained attention to functional tasks for 15 minutes with Mod A verbal cues for redirection.  SLP Short Term Goal 3 (Week 2): Patient will recall new, daily information with Min A multimodal cues.   Skilled Therapeutic Interventions:  Skilled treatment session focused on dysphagia and cognition goals. SLP facilitated session by providing skilled observation of pt consuming dysphagia 3 lunch tray with thin liquids via straw. Pt with general trace oral residue but no further overt s/s of aspiration. Pt's cognitive deficits impede her ability to sustain attention to task, initiate task and consume meal within timely manner. However having someone present (I.e., increasing supervision to full supervision during meals) is more distracting to pt. Will continue intermittent unless weight or nutrition tends downward. Overall pt required Mod A cues to initiate task, maintain task, decrease perseveration on internal distractions/topics as well as no insight into deficits. Education provided to pt's nurse on decreased distractions during meals. Nursing aware.      Pain Pain Assessment Faces Pain Scale: Hurts whole lot(pt unable to rate pain) Pain Type: Acute pain Pain Location: Groin Pain Orientation: Right Pain Descriptors / Indicators: Sharp Pain Onset: With Activity Pain Intervention(s): Repositioned  Therapy/Group: Individual Therapy  Savio Albrecht 03/05/2019, 1:29 PM

## 2019-03-05 NOTE — Progress Notes (Signed)
LLE venous duplex       has been completed. Preliminary results can be found under CV proc through chart review. Fender Herder, BS, RDMS, RVT    

## 2019-03-06 ENCOUNTER — Inpatient Hospital Stay (HOSPITAL_COMMUNITY): Payer: Medicare Other

## 2019-03-06 ENCOUNTER — Inpatient Hospital Stay (HOSPITAL_COMMUNITY): Payer: Medicare Other | Admitting: Occupational Therapy

## 2019-03-06 DIAGNOSIS — D72829 Elevated white blood cell count, unspecified: Secondary | ICD-10-CM

## 2019-03-06 LAB — URINALYSIS, COMPLETE (UACMP) WITH MICROSCOPIC
Bilirubin Urine: NEGATIVE
Glucose, UA: NEGATIVE mg/dL
Ketones, ur: NEGATIVE mg/dL
Nitrite: POSITIVE — AB
Protein, ur: 30 mg/dL — AB
Specific Gravity, Urine: 1.018 (ref 1.005–1.030)
pH: 6 (ref 5.0–8.0)

## 2019-03-06 MED ORDER — MUSCLE RUB 10-15 % EX CREA
TOPICAL_CREAM | CUTANEOUS | Status: DC | PRN
  Administered 2019-03-08 – 2019-03-24 (×9): via TOPICAL
  Administered 2019-03-25: 1 via TOPICAL
  Administered 2019-03-25 (×2): via TOPICAL
  Filled 2019-03-06: qty 85

## 2019-03-06 NOTE — Progress Notes (Signed)
Occupational Therapy Session Note  Patient Details  Name: Connie Oconnor MRN: [id number] Date of Birth: 01/14/1941  Today's Date: 03/06/2019 OT Individual Time: 1100-1133 OT Individual Time Calculation (min): 33 min    Short Term Goals: Week 2:  OT Short Term Goal 1 (Week 2): Pt will consistently transfer to toilet/BSC wiht MIN A OT Short Term Goal 2 (Week 2): pt will thread BUE into shirt wiht S OT Short Term Goal 3 (Week 2): Pt will recall hemi techniques iwht min VC OT Short Term Goal 4 (Week 2): Pt will thread 1LE into pants wiht VC  Skilled Therapeutic Interventions/Progress Updates:    Pt received sitting in TIS w/c reporting need to use bathroom, no c/o pain. Pt required heavy cueing throughout session for initiation of movement, often saying to OT "you keep rushing me" following 5+ min of cueing. Pt completed SPT to BSC with mod A, cueing needed for hand placement. Pt voided urine and required max A to manage clothing in standing. Pt requested to return to bed, completed squat pivot with mod A. Pt left supine with all needs met, bed alarm set.   Therapy Documentation Precautions:  Precautions Precautions: Fall Precaution Comments: L hemi, back pain, needs time to initiate movements  Restrictions Weight Bearing Restrictions: No   Therapy/Group: Individual Therapy   H  03/06/2019, 11:55 AM 

## 2019-03-06 NOTE — Progress Notes (Signed)
Occupational Therapy Session Note  Patient Details  Name: Connie Oconnor MRN: [id number] Date of Birth: 08-13-1941  Today's Date: 03/06/2019 OT Individual Time: 2694-8546 OT Individual Time Calculation (min): 59 min    Short Term Goals: Week 2:  OT Short Term Goal 1 (Week 2): Pt will consistently transfer to toilet/BSC wiht MIN A OT Short Term Goal 2 (Week 2): pt will thread BUE into shirt wiht S OT Short Term Goal 3 (Week 2): Pt will recall hemi techniques iwht min VC OT Short Term Goal 4 (Week 2): Pt will thread 1LE into pants wiht VC  Skilled Therapeutic Interventions/Progress Updates:    Pt in bed to start session, agreed to transfer OOB to work on eating lunch.  Max assist for supine to sit EOB on the left side.  Increased lean to the left initially secondary to pushing with the sound RUE.   She needed total assist for donning her shoes in supine prior to sitting up.  Increased pain reported in the right hip when therapist attempted to help place her heel in the shoe.  She required mod assist for sitting balance with progression to min guard for safety.  Attempted to have pt lean down on the right forearm to help decrease pushing, however she was not comfortable with this position and continued pushing back up onto her hand.  She next completed stand pivot transfer to the right with mod assist.  Increased fear noted with pt squatting down after standing in order to reach the arm rest of the chair to hold during transfer.  Once in the chair, she focused on self feeding her lunch for the remainder of the session.  She used the LUE with built up handle on the fork and the spoon to each her ice cream, peaches, and magic cup, all with max facilitation.  Encouraged upright head posture as she tends to keep cervical flexion at rest and with eating and drinking.  She was able to use the RUE to drink from her cup, with mod instructional cueing for small sips.  Finished session with pt in the wheelchair,  tilted back for comfort, with call button and phone in reach.  Safety belt in place as well and nurse tech made aware of pt being up.    Therapy Documentation Precautions:  Precautions Precautions: Fall Precaution Comments: L hemi, back pain, needs time to initiate movements  Restrictions Weight Bearing Restrictions: No  Pain: Pain Assessment Pain Scale: Faces Pain Score: 0-No pain Faces Pain Scale: Hurts a little bit Pain Type: Acute pain Pain Location: Leg Pain Orientation: Right Pain Descriptors / Indicators: Discomfort Pain Onset: With Activity Pain Intervention(s): Repositioned ADL: See Care Tool for some details of ADL  Therapy/Group: Individual Therapy  Quatavious Rossa OTR/L 03/06/2019, 3:47 PM

## 2019-03-06 NOTE — Progress Notes (Signed)
Physical Therapy Session Note  Patient Details  Name: Connie Oconnor MRN: [id number] Date of Birth: [birth year]  Today's Date: 03/06/2019 PT Individual Time: 0815-0900 PT Individual Time Calculation (min): 45 min   Short Term Goals: Week 2:  PT Short Term Goal 1 (Week 2): pt will perform stand pivot transfers with mod assist consistently PT Short Term Goal 2 (Week 2): pt will initiate gait training PT Short Term Goal 3 (Week 2): pt will balance in sitting wiht feet supported, reaching within BOS , wiht min assist  Skilled Therapeutic Interventions/Progress Updates:     Patient in bed eating breakfast upon PT arrival. Patient requested to finish breakfast before starting PT session. Missed 15 minutes skilled PT therapy for patient to finish breakfast. Patient alert and agreeable to PT session. B TED hose and tennis shoes were donned with total A prior to mobility. PT assist patient with eating her yogurt while having her attend to the L and using hand over hand to promote holding the yogurt cup in her L hand at beginning of session.   Therapeutic Activity: Bed Mobility: Patient performed supine to/from sit with min-mod A x2. Provided verbal cues for rolling to L side and pushing up from the bed with the bed slightly elevated. Sat EOB for 2 minutes with CGA before fatiguing and asking to lay down to rest before second trial.  Transfers: Patient performed sit to/from stand x1 and stand pivot to the TIS w/c x1 with mod A. Provided verbal cues for hand placement and erect posture, patient leans very far forward. Requires increased time with all mobility.   Patient in TIS w/c tilted back to reduce forward lean at end of session with breaks locked, seat belt alarm set, L arm on tray on armrest with a pillow, a pillow behind her head and all needs within reach. Educated patient on TIS w/c and benefit for improved posture and safety.    Therapy Documentation Precautions:  Precautions Precautions:  Fall Precaution Comments: L hemi, back pain, needs time to initiate movements  Restrictions Weight Bearing Restrictions: No General: PT Amount of Missed Time (min): 15 Minutes PT Missed Treatment Reason: Patient unwilling to participate(due to eating breakfast) Pain: Pain Assessment Pain Score: 0-No pain   Therapy/Group: Individual Therapy  Jadi Deyarmin L Mitsy Owen PT, DPT  03/06/2019, 1:09 PM

## 2019-03-06 NOTE — Progress Notes (Signed)
[place] PHYSICAL MEDICINE & REHABILITATION PROGRESS NOTE   Subjective/Complaints: Patient seen sitting up in bed this AM.  She states she slept well overnight.  She denies abdominal pain after having a good BM yesterday.   ROS: Denies CP, SOB, abdominal pain, N/V/D  Objective:   Vas [place] Lower Extremity Venous (dvt)  Result Date: 03/06/2019  Lower Venous Study Indications: Edema.  Performing Technologist: Jeb Levering RDMS, RVT  Examination Guidelines: A complete evaluation includes B-mode imaging, spectral Doppler, color Doppler, and power Doppler as needed of all accessible portions of each vessel. Bilateral testing is considered an integral part of a complete examination. Limited examinations for reoccurring indications may be performed as noted.  +-----+---------------+---------+-----------+----------+-------+ RIGHTCompressibilityPhasicitySpontaneityPropertiesSummary +-----+---------------+---------+-----------+----------+-------+ CFV  Full           Yes      Yes                          +-----+---------------+---------+-----------+----------+-------+   +---------+---------------+---------+-----------+----------+-------+ LEFT     CompressibilityPhasicitySpontaneityPropertiesSummary +---------+---------------+---------+-----------+----------+-------+ CFV      Full           Yes      Yes                          +---------+---------------+---------+-----------+----------+-------+ SFJ      Full                                                 +---------+---------------+---------+-----------+----------+-------+ FV Prox  Full                                                 +---------+---------------+---------+-----------+----------+-------+ FV Mid   Full                                                 +---------+---------------+---------+-----------+----------+-------+ FV DistalFull                                                  +---------+---------------+---------+-----------+----------+-------+ PFV      Full                                                 +---------+---------------+---------+-----------+----------+-------+ POP      Full           Yes      Yes                          +---------+---------------+---------+-----------+----------+-------+ PTV      Full                                                 +---------+---------------+---------+-----------+----------+-------+  PERO     Full                                                 +---------+---------------+---------+-----------+----------+-------+     Summary: Right: No evidence of common femoral vein obstruction. Left: There is no evidence of deep vein thrombosis in the lower extremity. No cystic structure found in the popliteal fossa.  *See table(s) above for measurements and observations. Electronically signed by Fabienne Bruns MD on 03/06/2019 at 2:38:57 AM.    Final    Recent Labs    03/05/19 0947  WBC 13.4*  HGB 11.2*  HCT 33.1*  PLT 522*   No results for input(s): NA, K, CL, CO2, GLUCOSE, BUN, CREATININE, CALCIUM in the last 72 hours.  Intake/Output Summary (Last 24 hours) at 03/06/2019 0920 Last data filed at 03/05/2019 1700 Gross per 24 hour  Intake 240 ml  Output -  Net 240 ml     Physical Exam: Vital Signs Blood pressure (!) 141/59, pulse 62, temperature 98.9 F (37.2 C), temperature source Oral, resp. rate 16, height  (1.651 m), weight 68.5 kg, SpO2 96 %. Constitutional: No distress . Vital signs reviewed. HENT: Normocephalic. Atraumatic.  Eyes: EOMI. No discharge. Cardiovascular: No JVD. Respiratory: Normal effort. GI: Non-distended.  No tenderness to palpation in the right lower quadrant. Musc: +TTP left calf. Neurologic: Alert and oriented Motor:  Left upper extremity: Shoulder abduction 3/5, distally 4/5, unchanged Left lower extremity: 4 +/5 proximal to distal, unchanged Right lower extremity: 2/5  hip flexion, knee extension (?pain inhibition) Emerging tone Skin: Warm and dry.  Intact.  Assessment/Plan: 1. Functional deficits secondary to right corona radiata infarct which require 3+ hours per day of interdisciplinary therapy in a comprehensive inpatient rehab setting.  Physiatrist is providing close team supervision and 24 hour management of active medical problems listed below.  Physiatrist and rehab team continue to assess barriers to discharge/monitor patient progress toward functional and medical goals  Care Tool:  Bathing  Bathing activity did not occur: Refused Body parts bathed by patient: Left arm, Abdomen, Chest, Front perineal area, Right upper leg, Left upper leg, Face   Body parts bathed by helper: Right arm, Buttocks, Right lower leg, Left lower leg     Bathing assist Assist Level: Moderate Assistance - Patient 50 - 74%     Upper Body Dressing/Undressing Upper body dressing   What is the patient wearing?: Pull over shirt    Upper body assist Assist Level: Maximal Assistance - Patient 25 - 49%    Lower Body Dressing/Undressing Lower body dressing      What is the patient wearing?: Pants, Incontinence brief     Lower body assist Assist for lower body dressing: 2 Helpers     Toileting Toileting    Toileting assist Assist for toileting: Maximal Assistance - Patient 25 - 49%     Transfers Chair/bed transfer  Transfers assist     Chair/bed transfer assist level: Moderate Assistance - Patient 50 - 74%     Locomotion Ambulation   Ambulation assist   Ambulation activity did not occur: Safety/medical concerns(fatigue/pain )  Assist level: Moderate Assistance - Patient 50 - 74% Assistive device: Walker-rolling(and L hand splint) Max distance: 5'   Walk 10 feet activity   Assist  Walk 10 feet activity did not occur: Safety/medical concerns(fatigue/pain )  Walk 50 feet activity   Assist Walk 50 feet with 2 turns activity did  not occur: Safety/medical concerns(fatigue/pain )         Walk 150 feet activity   Assist Walk 150 feet activity did not occur: Safety/medical concerns(fatigue/pain )         Walk 10 feet on uneven surface  activity   Assist Walk 10 feet on uneven surfaces activity did not occur: Safety/medical concerns(fatigue/pain )         Wheelchair     Assist Will patient use wheelchair at discharge?: Yes Type of Wheelchair: Manual    Wheelchair assist level: Supervision/Verbal cueing Max wheelchair distance: 15'    Wheelchair 50 feet with 2 turns activity    Assist    Wheelchair 50 feet with 2 turns activity did not occur: Safety/medical concerns(fatigue/pain )       Wheelchair 150 feet activity     Assist Wheelchair 150 feet activity did not occur: Safety/medical concerns(fatigue/pain )        Medical Problem List and Plan: 1.Left side weaknesssecondary to 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  Cont CIR Plan outpatient coiling of aneurysm per interventional radiology  Will consider anti-spasticity medications if necessary  Discussed with therapies - motivation/pain likely limiting RLE 2. Antithrombotics: -DVT/anticoagulation:SCDs.  Lower extremity Dopplers negative DVT -antiplatelet therapy: aspirin 81 mg daily Plavix 75 mg daily 3 weeks and Plavix alone 3. Pain Management/migraine headaches:Tramadolasneeded Scheduled Flexeril (home med) for right hand cramps/back Added kpad for low back  Muscle rub ordered for left calf 4. Mood:Provide emotional support -antipsychotic agents: N/A 5. Neuropsych: This patientis capable of making decisions on herown behalf.  Continue tele-sitter for safety  Needs significant encouragement 6. Skin/Wound  Care:Routine skin checks 7. Fluids/Electrolytes/Nutrition:Routine in and outs  D3 thins, advance diet as tolerated 8. [facility] Patient seen in the past by Dr. Conchita ParisNundkumar 9. Hypertension.  Vitals:   03/06/19 0458 03/06/19 0631  BP: (!) 141/59   Pulse: 62   Resp: 16   Temp: 100.1 F (37.8 C) 98.9 F (37.2 C)  SpO2: 96%    Norvasc 10 mg daily, decreased to 5 mg on 4/26  Slightly elevated on 5/5  10. Hyperlipidemia. Lipitor 11. Tobacco abuse. Counseling 12.  Hyperglycemia  Hemoglobin A1c 5.5 on 01/2019  P.o. intake remains inconsistent on 5/4  Continue to monitor with increased activity 13.  Acute blood loss anemia  Hemoglobin 11.2 on 5/4  Continue to monitor 14.  Hypoalbuminemia  Supplement initiated on 4/27 15.  Slow transit constipation  Bowel regimen increased on 5/3, increased again on 5/4  Improving on 5/5 16. Low grade fevers, trending up  Patient also appears more lethargic today  WBCs 13.4 on 5/4  UA/Ucx ordered  Will consider further workup   LOS: 11 days A FACE TO FACE EVALUATION WAS PERFORMED  Ankit Karis JubaAnil Patel 03/06/2019, 9:20 AM

## 2019-03-06 NOTE — Progress Notes (Signed)
Speech Language Pathology Daily Session Note  Patient Details  Name: Connie Oconnor MRN: [id number] Date of Birth: 1941-09-17  Today's Date: 03/06/2019 SLP Individual Time: 1001-1059 SLP Individual Time Calculation (min): 58 min  Short Term Goals: Week 2: SLP Short Term Goal 1 (Week 2): Patient will demonstrate functional problem solving for basic tasks with Min A verbal cues.  SLP Short Term Goal 2 (Week 2): Patient will demonstrate sustained attention to functional tasks for 15 minutes with Mod A verbal cues for redirection.  SLP Short Term Goal 3 (Week 2): Patient will recall new, daily information with Min A multimodal cues.   Skilled Therapeutic Interventions:Skilled ST services focused on cognitive skills. SLP facilitated sustained attention, basic problem solving and recall during card sorting task, pt demonstrated ability to sort card deck by color mod I and sort by shape with min A verbal cues for recall and min A verbal cues for problem solving with sustained attention to task for 15 minutes with cuing increasing from min to mod A verbal cues for redirection after 10 minute interval. SLP facilitated basic problem solving skills in novel card task played at simplest level, pt required mod A verbal cues for functional problem solving. Pt required max A verbal cues for verbal problem solving (listing how cards matched) and min A verbal cues for recall of three rules with visual aid. Pt continued to experience episodes of "being lost in thought" pausing and required cuing for redirection, upon questioning pt stated she was having trouble problem solving verse recall and attention. Pt was left with call bell within reach and chair alarm set. SLP recommends to continue ST services.     Pain Pain Assessment Pain Score: 0-No pain  Therapy/Group: Individual Therapy  Add Dinapoli  [facility] 03/06/2019, 12:33 PM

## 2019-03-07 ENCOUNTER — Inpatient Hospital Stay (HOSPITAL_COMMUNITY): Payer: Medicare Other

## 2019-03-07 ENCOUNTER — Inpatient Hospital Stay (HOSPITAL_COMMUNITY): Payer: Medicare Other | Admitting: Speech Pathology

## 2019-03-07 DIAGNOSIS — N390 Urinary tract infection, site not specified: Secondary | ICD-10-CM

## 2019-03-07 LAB — BASIC METABOLIC PANEL
Anion gap: 14 (ref 5–15)
BUN: 20 mg/dL (ref 8–23)
CO2: 23 mmol/L (ref 22–32)
Calcium: 8.7 mg/dL — ABNORMAL LOW (ref 8.9–10.3)
Chloride: 94 mmol/L — ABNORMAL LOW (ref 98–111)
Creatinine, Ser: 0.89 mg/dL (ref 0.44–1.00)
GFR calc Af Amer: >60 mL/min (ref >60)
GFR calc non Af Amer: >60 mL/min (ref >60)
Glucose, Bld: 158 mg/dL — ABNORMAL HIGH (ref 70–99)
Potassium: 3.8 mmol/L (ref 3.5–5.1)
Sodium: 131 mmol/L — ABNORMAL LOW (ref 135–145)

## 2019-03-07 LAB — GLUCOSE, CAPILLARY: Glucose-Capillary: 125 mg/dL — ABNORMAL HIGH (ref 70–99)

## 2019-03-07 MED ORDER — ENSURE ENLIVE PO LIQD
237.0000 mL | Freq: Three times a day (TID) | ORAL | Status: DC
  Administered 2019-03-07 – 2019-03-26 (×38): 237 mL via ORAL

## 2019-03-07 MED ORDER — NITROFURANTOIN MONOHYD MACRO 100 MG PO CAPS
100.0000 mg | ORAL_CAPSULE | Freq: Two times a day (BID) | ORAL | Status: AC
  Administered 2019-03-07 – 2019-03-13 (×14): 100 mg via ORAL
  Filled 2019-03-07 (×14): qty 1

## 2019-03-07 NOTE — Progress Notes (Signed)
Occupational Therapy Session Note  Patient Details  Name: Connie Oconnor MRN: [id number] Date of Birth: 1941/10/31  Today's Date: 03/07/2019 OT Individual Time: 1130-1155 OT Individual Time Calculation (min): 25 min    Short Term Goals: Week 2:  OT Short Term Goal 1 (Week 2): Pt will consistently transfer to toilet/BSC wiht MIN A OT Short Term Goal 2 (Week 2): pt will thread BUE into shirt wiht S OT Short Term Goal 3 (Week 2): Pt will recall hemi techniques iwht min VC OT Short Term Goal 4 (Week 2): Pt will thread 1LE into pants wiht VC  Skilled Therapeutic Interventions/Progress Updates:    Pt resting in bed upon arrival. Pt required max verbal cues for orientation to place or date.  Attempted to engage pt in LUE functional tasks and LUE NMR.  Pt unable to follow commands and required max verbal cues to keep her eyes open. Pt's responses to questions were mumbled and unintelligible. Pt required max verbal cues to engaged in therapy.  Pt remained in bed with all needs within reach and bed alarm activated.   Therapy Documentation Precautions:  Precautions Precautions: Fall Precaution Comments: L hemi, back pain, needs time to initiate movements  Restrictions Weight Bearing Restrictions: No Pain: Pain Assessment Pain Scale: (P) 0-10 Pain Score: (P) 0-No pain   Therapy/Group: Individual Therapy  Rich Brave 03/07/2019, 12:01 PM

## 2019-03-07 NOTE — Progress Notes (Signed)
Nutrition Follow-up  RD working remotely.  DOCUMENTATION CODES:   Not applicable  INTERVENTION:   - Please obtain new weight, last weight is from 4/25  Pt is at risk for acute protein-calorie malnutrition given continued poor PO intake.  - Increase Ensure Enlive po to TID, each supplement provides 350 kcal and 20 grams of protein  - Continue Magic Cup TID with meals, each supplement provides 290 kcal and 9 grams of protein  - Continue MVI with minerals daily  - Encourage adequate PO intake  NUTRITION DIAGNOSIS:   Increased nutrient needs related to acute illness as evidenced by estimated needs.  Ongoing, being addressed via oral nutrition supplements  GOAL:   Patient will meet greater than or equal to 90% of their needs  Progressing  MONITOR:   PO intake, Supplement acceptance, Labs, Weight trends, Skin  REASON FOR ASSESSMENT:   Malnutrition Screening Tool    ASSESSMENT:   78 y.o. female with history of PVC, hypothyroidism, arthritis, migraines presenting with L sided weakness to [place] on 4/21. MRI small R CR infarct, 49mm R MCA aneurysm. Admitted to [facility] on 4/24.  Reviewed SLP notes. Per notes, PO intake limited by cognitive deficits which impede pt's ability to sustain attention to task, initiate task, and consume meal within a timely manner. Per SLP note on 5/04, increasing supervision to full is more distracting to pt so will continue with intermittent supervision at this time.  Pt's nutrition remains suboptimal. Please obtain new weight so that RD can assess for acute malnutrition. If pt has experienced significant weight loss for timeframe, RD can diagnose pt with acute malnutrition based on based on PO intake and weight loss. If pt continues to be unable to meet nutritional needs via PO intake alone, can consider short-term enteral nutrition support via [employer]rak NGT.  Attempted to speak with pt via phone call to room but pt did not  answer.  Will increase Ensure Enlive from BID to TID as it appears pt is accepting supplements per [facility] documentation. Will continue with Magic Cup with meals.  Meal Completion: 5-60% x last 8 recorded meals (averaging 33%)  Medications reviewed and include: Ensure Enlive BID (pt accepting >90%), MVI with minerals daily, Miralax, Senna  Labs reviewed: sodium 131 (L), chloride 94 (L)  UOP: 575 ml x 24 hours I/O's: +4.2 L since admit  Diet Order:   Diet Order            DIET DYS 3 Room service appropriate? Yes; Fluid consistency: Thin  Diet effective now              EDUCATION NEEDS:   Not appropriate for education at this time  Skin:  Skin Assessment: Reviewed RN Assessment  Last BM:  03/05/19  Height:   Ht Readings from Last 1 Encounters:  02/23/19 5\' 5"  (1.651 m)    Weight:   Wt Readings from Last 1 Encounters:  02/24/19 68.5 kg    Ideal Body Weight:  56.8 kg  BMI:  Body mass index is 25.13 kg/m.  Estimated Nutritional Needs:   Kcal:  1700-1900 kcals   Protein:  80-90 g  Fluid:  >/= 1.7 L    Earma Reading, MS, RD, LDN Inpatient Clinical Dietitian Pager: [phone] Weekend/After Hours: [phone]

## 2019-03-07 NOTE — Progress Notes (Signed)
Connie Oconnor PHYSICAL MEDICINE & REHABILITATION PROGRESS NOTE   Subjective/Complaints: Patient seen sitting up in her chair working with therapies this morning.  She states she slept well overnight.  Discussed with therapies again right lower extremity weakness.  Patient notes that weakness is related to pain.  Function improving per therapies.  ROS: + Right thigh pain.  Denies CP, SOB, abdominal pain, N/V/D  Objective:   Vas [place] Lower Extremity Venous (dvt)  Result Date: 03/06/2019  Lower Venous Study Indications: Edema.  Performing Technologist: Jeb LeveringJill Parker RDMS, RVT  Examination Guidelines: A complete evaluation includes B-mode imaging, spectral Doppler, color Doppler, and power Doppler as needed of all accessible portions of each vessel. Bilateral testing is considered an integral part of a complete examination. Limited examinations for reoccurring indications may be performed as noted.  +-----+---------------+---------+-----------+----------+-------+ RIGHTCompressibilityPhasicitySpontaneityPropertiesSummary +-----+---------------+---------+-----------+----------+-------+ CFV  Full           Yes      Yes                          +-----+---------------+---------+-----------+----------+-------+   +---------+---------------+---------+-----------+----------+-------+ LEFT     CompressibilityPhasicitySpontaneityPropertiesSummary +---------+---------------+---------+-----------+----------+-------+ CFV      Full           Yes      Yes                          +---------+---------------+---------+-----------+----------+-------+ SFJ      Full                                                 +---------+---------------+---------+-----------+----------+-------+ FV Prox  Full                                                 +---------+---------------+---------+-----------+----------+-------+ FV Mid   Full                                                  +---------+---------------+---------+-----------+----------+-------+ FV DistalFull                                                 +---------+---------------+---------+-----------+----------+-------+ PFV      Full                                                 +---------+---------------+---------+-----------+----------+-------+ POP      Full           Yes      Yes                          +---------+---------------+---------+-----------+----------+-------+ PTV      Full                                                 +---------+---------------+---------+-----------+----------+-------+  PERO     Full                                                 +---------+---------------+---------+-----------+----------+-------+     Summary: Right: No evidence of common femoral vein obstruction. Left: There is no evidence of deep vein thrombosis in the lower extremity. No cystic structure found in the popliteal fossa.  *See table(s) above for measurements and observations. Electronically signed by Fabienne Bruns MD on 03/06/2019 at 2:38:57 AM.    Final    Recent Labs    03/05/19 0947  WBC 13.4*  HGB 11.2*  HCT 33.1*  PLT 522*   No results for input(s): NA, K, CL, CO2, GLUCOSE, BUN, CREATININE, CALCIUM in the last 72 hours.  Intake/Output Summary (Last 24 hours) at 03/07/2019 0843 Last data filed at 03/07/2019 0012 Gross per 24 hour  Intake 290 ml  Output 575 ml  Net -285 ml     Physical Exam: Vital Signs Blood pressure (!) 130/56, pulse 81, temperature 98.7 F (37.1 C), resp. rate 15, height  (1.651 m), weight 68.5 kg, SpO2 94 %. Constitutional: No distress . Vital signs reviewed. HENT: Normocephalic.  Atraumatic. Eyes: EOMI.  No discharge. Cardiovascular: No JVD. Respiratory: Normal effort. GI: Non-distended.  No tenderness to palpation in the right lower quadrant. Musc: + TTP right thigh Neurologic: Alert and oriented Motor:  Left upper extremity: Shoulder  abduction 3/5, distally 4-4+/5 Left lower extremity: 4 +/5 proximal to distal, stable Right lower extremity: 2/5 hip flexion, knee extension (pain inhibition) Skin: Warm and dry.  Intact.  Assessment/Plan: 1. Functional deficits secondary to right corona radiata infarct which require 3+ hours per day of interdisciplinary therapy in a comprehensive inpatient rehab setting.  Physiatrist is providing close team supervision and 24 hour management of active medical problems listed below.  Physiatrist and rehab team continue to assess barriers to discharge/monitor patient progress toward functional and medical goals  Care Tool:  Bathing  Bathing activity did not occur: Refused Body parts bathed by patient: Left arm, Abdomen, Chest, Front perineal area, Right upper leg, Left upper leg, Face   Body parts bathed by helper: Right arm, Buttocks, Right lower leg, Left lower leg     Bathing assist Assist Level: Moderate Assistance - Patient 50 - 74%     Upper Body Dressing/Undressing Upper body dressing   What is the patient wearing?: Pull over shirt    Upper body assist Assist Level: Maximal Assistance - Patient 25 - 49%    Lower Body Dressing/Undressing Lower body dressing      What is the patient wearing?: Pants, Incontinence brief     Lower body assist Assist for lower body dressing: 2 Helpers     Toileting Toileting    Toileting assist Assist for toileting: Maximal Assistance - Patient 25 - 49%     Transfers Chair/bed transfer  Transfers assist     Chair/bed transfer assist level: Moderate Assistance - Patient 50 - 74%     Locomotion Ambulation   Ambulation assist   Ambulation activity did not occur: Safety/medical concerns(fatigue/pain )  Assist level: Moderate Assistance - Patient 50 - 74% Assistive device: Walker-rolling(and L hand splint) Max distance: 5'   Walk 10 feet activity   Assist  Walk 10 feet activity did not occur: Safety/medical  concerns(fatigue/pain )  Walk 50 feet activity   Assist Walk 50 feet with 2 turns activity did not occur: Safety/medical concerns(fatigue/pain )         Walk 150 feet activity   Assist Walk 150 feet activity did not occur: Safety/medical concerns(fatigue/pain )         Walk 10 feet on uneven surface  activity   Assist Walk 10 feet on uneven surfaces activity did not occur: Safety/medical concerns(fatigue/pain )         Wheelchair     Assist Will patient use wheelchair at discharge?: Yes Type of Wheelchair: Manual    Wheelchair assist level: Supervision/Verbal cueing Max wheelchair distance: 15'    Wheelchair 50 feet with 2 turns activity    Assist    Wheelchair 50 feet with 2 turns activity did not occur: Safety/medical concerns(fatigue/pain )       Wheelchair 150 feet activity     Assist Wheelchair 150 feet activity did not occur: Safety/medical concerns(fatigue/pain )        Medical Problem List and Plan: 1.Left side weaknesssecondary to 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  Cont CIR Plan outpatient coiling of aneurysm per interventional radiology  Will consider anti-spasticity medications if necessary, not appropriate at this point  Discussed with therapies - motivation/pain likely limiting RLE  Team conference today to discuss current and goals and coordination of care, home and environmental barriers, and discharge planning with nursing, case manager, and therapies.  2. Antithrombotics: -DVT/anticoagulation:SCDs.  Lower extremity Dopplers negative DVT -antiplatelet therapy: aspirin 81 mg daily Plavix 75 mg daily 3 weeks and Plavix alone 3. Pain Management/migraine headaches:Tramadolasneeded Scheduled Flexeril (home med) for right hand  cramps/back Added kpad for low back  Muscle rub ordered for left calf 4. Mood:Provide emotional support -antipsychotic agents: N/A 5. Neuropsych: This patientis capable of making decisions on herown behalf.  Continue tele-sitter for safety, will likely DC today  Needs significant encouragement 6. Skin/Wound Care:Routine skin checks 7. Fluids/Electrolytes/Nutrition:Routine in and outs  D3 thins, advance diet as tolerated 8. History of traumatic [facility] May 2018. Patient seen in the past by Dr. Conchita Paris 9. Hypertension.  Vitals:   03/06/19 1939 03/07/19 0443  BP: 109/62 (!) 130/56  Pulse: 79 81  Resp: 17 15  Temp: 99.1 F (37.3 C) 98.7 F (37.1 C)  SpO2: 98% 94%   Norvasc 10 mg daily, decreased to 5 mg on 4/26  Labile on 5/6 10. Hyperlipidemia. Lipitor 11. Tobacco abuse. Counseling 12.  Hyperglycemia  Hemoglobin A1c 5.5 on 01/2019  P.o. intake remains inconsistent on 5/4  Continue to monitor with increased activity 13.  Acute blood loss anemia  Hemoglobin 11.2 on 5/4  Continue to monitor 14.  Hypoalbuminemia  Supplement initiated on 4/27 15.  Slow transit constipation  Bowel regimen increased on 5/3, increased again on 5/4  Improving on 5/5 16. Low grade fevers: Improving  WBCs 13.4 on 5/4, labs ordered for tomorrow  UA appears to be positive, urine culture pending  Empiric Macrobid started on 5/6  Will consider further workup if necessary   LOS: 12 days A FACE TO FACE EVALUATION WAS PERFORMED  Ankit Karis Juba 03/07/2019, 8:43 AM

## 2019-03-07 NOTE — Progress Notes (Signed)
Occupational Therapy Session Note  Patient Details  Name: Connie Oconnor MRN: [id number] Date of Birth: Mar 20, 1941  Today's Date: 03/07/2019 OT Individual Time: 0700-0800 OT Individual Time Calculation (min): 60 min    Short Term Goals: Week 2:  OT Short Term Goal 1 (Week 2): Pt will consistently transfer to toilet/BSC wiht MIN A OT Short Term Goal 2 (Week 2): pt will thread BUE into shirt wiht S OT Short Term Goal 3 (Week 2): Pt will recall hemi techniques iwht min VC OT Short Term Goal 4 (Week 2): Pt will thread 1LE into pants wiht VC  Skilled Therapeutic Interventions/Progress Updates:    Pt resting in bed upon arrival.  Pt required max verbal cues for orientation to place and date.  OT intervention with focus on bed mobility, sitting balance, functional transfers, sit<>stand, dressing with sit<>stand at sink, task initiation, attention to task, and safety awareness to increase independence with BADLs. Pt required mod A for supine>sit EOB.  Pt required max A for sitting balance EOB with max verbal cues.  Pt states she knows she is falling to her L but is unable to self correct.  Pt required max A for squat pivot transfer to w/c.  Pt required max A for UB dressing and tot A for LB dressing.  Pt requires max A for sit<>stand from w/c at sink and is unable to participate in pulling pants over hips. Pt required assistance removing dentures from container and cleaning them before inserting.  Pt required max verbal cues for task initiation, sequencing, and attention to task.  Pt remained in TIS w/c (reclined) with belt alarm activated and half lap tray in place.  All needs within reach.   Therapy Documentation Precautions:  Precautions Precautions: Fall Precaution Comments: L hemi, back pain, needs time to initiate movements  Restrictions Weight Bearing Restrictions: No  Pain:  Pt c/o RLE inguinal pain with movement and sensitive to touch (unrated); MD aware and attended to pt, repositioned,  emotional support   Therapy/Group: Individual Therapy  Rich Brave 03/07/2019, 9:05 AM

## 2019-03-07 NOTE — Progress Notes (Signed)
Physical Therapy Session Note  Patient Details  Name: Connie Oconnor MRN: [id number] Date of Birth: 12/10/1940  Today's Date: 03/07/2019 PT Individual Time:Session1: 1040-1105; [place]: 1856-3149 PT Individual Time Calculation (min): Session1: 25 min; Session2: 58 min  Short Term Goals: Week 2:  PT Short Term Goal 1 (Week 2): pt will perform stand pivot transfers with mod assist consistently PT Short Term Goal 2 (Week 2): pt will initiate gait training PT Short Term Goal 3 (Week 2): pt will balance in sitting wiht feet supported, reaching within BOS , wiht min assist  Skilled Therapeutic Interventions/Progress Updates:    Session1:  Patient in TIS w/c upon arrival and c/o fatigue.  Patient sit to stand at sink mod to max A but c/o pain R groin and refused to stand longer.  Seated in w/c to brush teeth with max A hand over hand on L to hold dentures while pt brushed with R hand and assist to put toothpaste on toothbrush.  Patient transferred to bed to L mod A with increased time and max cues to lean forward and to R.  Sit to supine mod to max A due to pt laying across bed and c/o pain R groin with transitions.  Positioned with mod to max A and max cues.  Left with HOB elevated, call button in reach and bed alarm activated.  Session2:  Patient in supine half asleep with lunch plate in her lap and soup spilled in bed with her.  Assisted up to sit EOB and worked on eating lunch about 10 min with mod to max A for sitting balance and mod cues for attention to task, able to self feed with R hand but cues to avoid dropping items trying to use L hand.  Patient removed denture due to report of painful and continued eating with improved comfort.  Assisted to transfer to TIS w/c to finish lunch due to leaning to R and flexed posture  @ EOB preventing getting food and straw to her mouth.  Pivot to R with mod A.  Seated for remainder of meal and help to hold cup of ice cream closer to her.  Patient assisted in  w/c to gym and performed sit to stand in parallel x 2 and able to stand about 10 sec prior to sitting with continued c/o R groin pain.  Patient assisted to room and transferred to bed mod A to L.  Sit to supine mod to max A for positioning.  Left in supine HOB elevated with call bell in reach and bed alarm activated. Therapy Documentation Precautions:  Precautions Precautions: Fall Precaution Comments: L hemi, back pain, needs time to initiate movements  Restrictions Weight Bearing Restrictions: No Pain: Pain Assessment Faces Pain Scale: Hurts even more Pain Type: Acute pain Pain Location: Hip Pain Orientation: Right Pain Descriptors / Indicators: Discomfort;Sharp Pain Onset: With Activity Pain Intervention(s): Repositioned    Therapy/Group: Individual Therapy  Elray Mcgregor  [name], PT 03/07/2019, 4:34 PM

## 2019-03-07 NOTE — Progress Notes (Signed)
Social Work Patient ID: Connie Oconnor, female   DOB: 1941-10-01, 79 y.o.   MRN: [id number] Spoke with Cindy-daughter via telephone to discuss team conference goals and her UTI and not doing as well functionally. Both daughter's feel she may ned to go to a NH once rehab finished due to her need for more rehab and care. They both work and can not provide 24 hr care. Pt seems to think her 28 yo granddaughter can do her care. Discussed pt is not aware of her deficits and this makes her a safety risk when up moving. Her goals have been downgraded and now are min assist level. Will send daughter and NH list so she can begin researching them.

## 2019-03-07 NOTE — Progress Notes (Signed)
Speech Language Pathology Daily Session Note  Patient Details  Name: Connie Oconnor MRN: [id number] Date of Birth: 01-May-1941  Today's Date: 03/07/2019 SLP Individual Time: 2585-2778 SLP Individual Time Calculation (min): 45 min  Short Term Goals: Week 2: SLP Short Term Goal 1 (Week 2): Patient will demonstrate functional problem solving for basic tasks with Min A verbal cues.  SLP Short Term Goal 2 (Week 2): Patient will demonstrate sustained attention to functional tasks for 15 minutes with Mod A verbal cues for redirection.  SLP Short Term Goal 3 (Week 2): Patient will recall new, daily information with Min A multimodal cues.   Skilled Therapeutic Interventions: Skilled treatment session focused on cognitive goals. Upon arrival, patient was self-feeding her breakfast meal of Dys.3 textures with thin liquids. Patient consumed meal without overt s/s of aspiration but required Max A multimodal cues for functional problem solving and sustained attention to self-feeding. Therefore, recommend patient have full supervision with meals to maximize PO intake. Patient appeared lethargic and fell asleep while eating with decreased speech intelligibility and language of confusion. Vitals taken, BP: 143/127 and HR: 106. RN aware with patient being worked-up for possible UTI. Patient missed remaining 15 minutes of session due to fatigue.  Patient left upright reclined in tilt-in-space wheelchair with alarm on and all needs within reach. Continue with current plan of care.      Pain Pain Assessment Pain Scale: (P) 0-10 Pain Score: (P) 0-No pain  Therapy/Group: Individual Therapy  Anuhea Gassner 03/07/2019, 2:19 PM

## 2019-03-08 ENCOUNTER — Inpatient Hospital Stay (HOSPITAL_COMMUNITY): Payer: Medicare Other | Admitting: Speech Pathology

## 2019-03-08 ENCOUNTER — Inpatient Hospital Stay (HOSPITAL_COMMUNITY): Payer: Medicare Other

## 2019-03-08 ENCOUNTER — Inpatient Hospital Stay (HOSPITAL_COMMUNITY): Payer: Medicare Other | Admitting: Occupational Therapy

## 2019-03-08 DIAGNOSIS — R410 Disorientation, unspecified: Secondary | ICD-10-CM

## 2019-03-08 DIAGNOSIS — R5383 Other fatigue: Secondary | ICD-10-CM

## 2019-03-08 LAB — GLUCOSE, CAPILLARY
Glucose-Capillary: 118 mg/dL — ABNORMAL HIGH (ref 70–99)
Glucose-Capillary: 114 mg/dL — ABNORMAL HIGH (ref 70–99)
Glucose-Capillary: 112 mg/dL — ABNORMAL HIGH (ref 70–99)

## 2019-03-08 LAB — CBC
HCT: 31.3 % — ABNORMAL LOW (ref 36.0–46.0)
Hemoglobin: 10.3 g/dL — ABNORMAL LOW (ref 12.0–15.0)
MCH: 27.3 pg (ref 26.0–34.0)
MCHC: 32.9 g/dL (ref 30.0–36.0)
MCV: 83 fL (ref 80.0–100.0)
Platelets: 509 10*3/uL — ABNORMAL HIGH (ref 150–400)
RBC: 3.77 MIL/uL — ABNORMAL LOW (ref 3.87–5.11)
RDW: 13.4 % (ref 11.5–15.5)
WBC: 15.7 10*3/uL — ABNORMAL HIGH (ref 4.0–10.5)
nRBC: 0 % (ref 0.0–0.2)

## 2019-03-08 LAB — URINE CULTURE: Culture: >=100000 — AB

## 2019-03-08 NOTE — Progress Notes (Signed)
Occupational Therapy Session Note  Patient Details  Name: Connie Oconnor MRN: [id number] Date of Birth: 06-08-1941  Today's Date: 03/08/2019 OT Individual Time: 0835-0900 OT Individual Time Calculation (min): 25 min    Short Term Goals: Week 2:  OT Short Term Goal 1 (Week 2): Pt will consistently transfer to toilet/BSC wiht MIN A OT Short Term Goal 2 (Week 2): pt will thread BUE into shirt wiht S OT Short Term Goal 3 (Week 2): Pt will recall hemi techniques iwht min VC OT Short Term Goal 4 (Week 2): Pt will thread 1LE into pants wiht VC  Skilled Therapeutic Interventions/Progress Updates:    Pt greeted semi-reclined in bed with nursing present administering medications via applesauce. Pt agreeable to OT, but declined getting OOB 2/2 R LE pain. OT issued yellow thera-putty and pink foam block. Worked on L attention and grip strength. Pt needed constant cues to use L hand only with exercises. Pt left semi-reclined in bed with bed alarm on and needs met.   Therapy Documentation Precautions:  Precautions Precautions: Fall Precaution Comments: L hemi, back pain, needs time to initiate movements  Restrictions Weight Bearing Restrictions: No Pain: Pain Assessment Pain Scale: Faces Pain Location: Leg Pain Orientation: Right Pain Descriptors / Indicators: Discomfort Pain Onset: On-going Pain Intervention(s): Repositioned; nursing applied gel   Therapy/Group: Individual Therapy  Valma Cava 03/08/2019, 9:13 AM

## 2019-03-08 NOTE — Patient Care Conference (Signed)
Inpatient RehabilitationTeam Conference and Plan of Care Update Date: 03/07/2019   Time: 2:15 PM    Patient Name: Nancy MarusRebecca Fangman      Medical Record Number: [id number]  Date of Birth: 1941/02/05 Sex: Female         Room/Bed: 4M05C/4M05C-01 Payor Info: Payor: [employer] MEDICARE / Plan: [employer] MEDICARE / Product Type: *No Product type* /    Admitting Diagnosis: CVA  Admit Date/Time:  02/23/2019  5:02 PM Admission Comments: No comment available   Primary Diagnosis:  <principal problem not specified> Principal Problem: <principal problem not specified>  Patient Active Problem List   Diagnosis Date Noted  . Acute lower UTI   . Leukocytosis   . Slow transit constipation   . Pain of left calf   . Impulsiveness   . Dysphagia, post-stroke   . Labile blood pressure   . Hyperglycemia   . Hypotension   . Hypoalbuminemia due to protein-calorie malnutrition (HCC)   . Acute blood loss anemia   . Hypoglycemia   . Essential hypertension   . History of traumatic brain injury   . Lacunar infarct, acute (HCC) 02/23/2019  . 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 Hypothyroidism (acquired) 02/20/2019  . Closed fracture of medial portion of right tibial plateau 01/11/2018  . Acute medial meniscal tear, right, initial encounter 01/11/2018  . SAH (subarachnoid hemorrhage) (HCC) 03/29/2017    Expected Discharge Date: Expected Discharge Date: 03/17/19  Team Members Present: Physician leading conference: Dr. 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    Improve mobility, safety, ?UTI, pain  See above   Bowel/Bladder   Pt is incontinent of bladder and continent of bowel. LBM 03/05/2019.  Encourage timed toileting.  Assist with toileting needs PRN.   Swallow/Nutrition/ Hydration   Dys. 3 textures with thin liquids, Mod A  Mod I  tolerance of current diet    ADL's   mod assist for bathing with max assist for UB dressing.  Total +2 for LB dressing sit to stand.  Mod assist for squat pivot/stand pivot transfers.  Still with decreased awareness and decreased LUE function, requiring max hand over hand assist for self feeding  supervision overall  transfer training, balance retraining, neuromuscular re-education, percetual retraining, LUE therapeutic exercise   Mobility   bed mobility min-mod A; stand pivot and sit<> stand trnasfers mod A with RW  Supervision for mobility, gait 100' with LRAD, mod I w/c mobility 150'  strengthening, balance, midline orientation-leans L and forward, functional mobility, w/c mobility   Communication   Supervision  Mod I  use of speech intelligibility strategies    Safety/Cognition/ Behavioral Observations  Mod-Max A   Supervision-Min A   attention, problem solving, recall    Pain   No complaints of pain.  Remain pain free.  Assess pain Q shift and PRN.   Skin   No skin issues.  Maintain skin integrity and prevent skin breakdown.  Assess skin Q shift and PRN.      *See Care Plan and  progress notes for long and short-term goals.     Barriers to Discharge  Current Status/Progress Possible Resolutions Date Resolved   Physician    Medical stability;Decreased caregiver support;Lack of/limited family support     See above  Therapies, optimize pain meds, empiric abx      Nursing                  PT  Decreased caregiver support;Incontinence  Pt will need 24/7 supervision and physical assist upon d/c               OT                  SLP                SW                Discharge Planning/Teaching Needs:  Family is trying to come up with a DC plan for pt but may not be able to provide 24 hr physical care and may need to pursue NHP. Pt not on board currently with this plan      Team Discussion:  Pt has declined in function-according to MD has UTI started treating. Decreased awareness of her deficits. Pain issues with her groin where incision is. Telesitter in place for safety. Downgraded goals to min assist level due to slow progress in therapies. Timed toileting to assist with continence. Daughter's discussing care at DC  Revisions to Treatment Plan:  DC 5/16 versus NHP    Continued Need for Acute Rehabilitation Level of Care: The patient requires daily medical management by a physician with specialized training in physical medicine and rehabilitation for the following conditions: Daily direction of a multidisciplinary physical rehabilitation program to ensure safe treatment while eliciting the highest outcome that is of practical value to the patient.: Yes Daily medical management of patient stability for increased activity during participation in an intensive rehabilitation regime.: Yes Daily analysis of laboratory values and/or radiology reports with any subsequent need for medication adjustment of medical intervention for : Neurological problems;Diabetes problems;Blood pressure problems;Urological problems   I attest that I was present, lead the team conference, and concur with the assessment and plan of the team.   Lucy Chris 03/08/2019, 8:31 AM

## 2019-03-08 NOTE — Progress Notes (Signed)
Speech Language Pathology Daily Session Note  Patient Details  Name: Connie Oconnor MRN: [id number] Date of Birth: April 22, 1941  Today's Date: 03/08/2019 SLP Individual Time: 0735-0830 SLP Individual Time Calculation (min): 55 min  Short Term Goals: Week 2: SLP Short Term Goal 1 (Week 2): Patient will demonstrate functional problem solving for basic tasks with Min A verbal cues.  SLP Short Term Goal 2 (Week 2): Patient will demonstrate sustained attention to functional tasks for 15 minutes with Mod A verbal cues for redirection.  SLP Short Term Goal 3 (Week 2): Patient will recall new, daily information with Min A multimodal cues.   Skilled Therapeutic Interventions: Skilled treatment session focused on cognitive goals. SLP facilitated session by providing Max A verbal, visual and environmental cues for orientation to time of day, date, place and situation. Patient with intermittent language of confusion throughout session with Max A verbal cues required for sustained attention to functional and basic tasks. SLP also facilitated session by providing Max A verbal cues for problem solving when donning dentures and during self-feeding. Patient left upright in bed with all needs within reach. Continue with current plan of care.      Pain Pain Assessment Pain Scale: Faces Pain Score: 0-No pain  Therapy/Group: Individual Therapy  Cherilyn Sautter 03/08/2019, 2:45 PM

## 2019-03-08 NOTE — Progress Notes (Signed)
[place] PHYSICAL MEDICINE & REHABILITATION PROGRESS NOTE   Subjective/Complaints: Patient seen sitting up in bed this morning working with SLP.  She states she slept well overnight.  He is more confused this morning.  Discussed with SLP as well.  ROS: + Right thigh pain.  Denies CP, SOB, abdominal pain, N/V/D  Objective:   No results found. No results for input(s): WBC, HGB, HCT, PLT in the last 72 hours. Recent Labs    03/07/19 0927  NA 131*  K 3.8  CL 94*  CO2 23  GLUCOSE 158*  BUN 20  CREATININE 0.89  CALCIUM 8.7*    Intake/Output Summary (Last 24 hours) at 03/08/2019 1109 Last data filed at 03/08/2019 6333 Gross per 24 hour  Intake 240 ml  Output 800 ml  Net -560 ml     Physical Exam: Vital Signs Blood pressure (!) 144/68, pulse 86, temperature 98.3 F (36.8 C), temperature source Oral, resp. rate 18, height 5\' 5"  (1.651 m), weight 68.5 kg, SpO2 94 %. Constitutional: No distress . Vital signs reviewed. HENT: Normocephalic.  Atraumatic. Eyes: EOMI.  No discharge. Cardiovascular: No JVD. Respiratory: Normal effort. GI: Non-distended.  No tenderness to palpation in the right lower quadrant. Musc: + TTP right thigh Neurologic: Alert and and oriented x1 Motor:  Left upper extremity: Shoulder abduction 3/5, distally 4-4+/5 Left lower extremity: 4 +/5 proximal to distal, unchanged Right lower extremity: 2/5 hip flexion, knee extension (pain inhibition, unchanged) Skin: Warm and dry.  Intact.  Assessment/Plan: 1. Functional deficits secondary to right corona radiata infarct which require 3+ hours per day of interdisciplinary therapy in a comprehensive inpatient rehab setting.  Physiatrist is providing close team supervision and 24 hour management of active medical problems listed below.  Physiatrist and rehab team continue to assess barriers to discharge/monitor patient progress toward functional and medical goals  Care Tool:  Bathing  Bathing activity did not  occur: Safety/medical concerns Body parts bathed by patient: Left arm, Abdomen, Chest, Front perineal area, Right upper leg, Left upper leg, Face   Body parts bathed by helper: Right arm, Buttocks, Right lower leg, Left lower leg     Bathing assist Assist Level: Moderate Assistance - Patient 50 - 74%     Upper Body Dressing/Undressing Upper body dressing   What is the patient wearing?: Pull over shirt    Upper body assist Assist Level: Maximal Assistance - Patient 25 - 49%    Lower Body Dressing/Undressing Lower body dressing      What is the patient wearing?: Pants, Incontinence brief     Lower body assist Assist for lower body dressing: Total Assistance - Patient < 25%     Toileting Toileting    Toileting assist Assist for toileting: Maximal Assistance - Patient 25 - 49%     Transfers Chair/bed transfer  Transfers assist     Chair/bed transfer assist level: Moderate Assistance - Patient 50 - 74%     Locomotion Ambulation   Ambulation assist   Ambulation activity did not occur: Safety/medical concerns(fatigue/pain )  Assist level: Moderate Assistance - Patient 50 - 74% Assistive device: Walker-rolling(and L hand splint) Max distance: 5'   Walk 10 feet activity   Assist  Walk 10 feet activity did not occur: Safety/medical concerns(fatigue/pain )        Walk 50 feet activity   Assist Walk 50 feet with 2 turns activity did not occur: Safety/medical concerns(fatigue/pain )         Walk 150 feet activity  Assist Walk 150 feet activity did not occur: Safety/medical concerns(fatigue/pain )         Walk 10 feet on uneven surface  activity   Assist Walk 10 feet on uneven surfaces activity did not occur: Safety/medical concerns(fatigue/pain )         Wheelchair     Assist Will patient use wheelchair at discharge?: Yes Type of Wheelchair: Manual    Wheelchair assist level: Supervision/Verbal cueing Max wheelchair distance: 5215'     Wheelchair 50 feet with 2 turns activity    Assist    Wheelchair 50 feet with 2 turns activity did not occur: Safety/medical concerns(fatigue/pain )       Wheelchair 150 feet activity     Assist Wheelchair 150 feet activity did not occur: Safety/medical concerns(fatigue/pain )        Medical Problem List and Plan: 1.Left side weaknesssecondary to 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  Cont CIR Plan outpatient coiling of aneurysm per interventional radiology  Will consider anti-spasticity medications if necessary, not appropriate at this point  Discussed with therapies - motivation/pain likely limiting RLE 2. Antithrombotics: -DVT/anticoagulation:SCDs.  Lower extremity Dopplers negative DVT -antiplatelet therapy: aspirin 81 mg daily Plavix 75 mg daily 3 weeks and Plavix alone 3. Pain Management/migraine headaches:Tramadolasneeded Scheduled Flexeril (home med) for right hand cramps/back Added kpad for low back  Muscle rub ordered for left calf 4. Mood:Provide emotional support -antipsychotic agents: N/A 5. Neuropsych: This patientis capable of making decisions on herown behalf.  Continue tele-sitter for safety  Needs significant encouragement 6. Skin/Wound Care:Routine skin checks 7. Fluids/Electrolytes/Nutrition:Routine in and outs  D3 thins, advance diet as tolerated 8. [facility] Patient seen in the past by Dr. Conchita ParisNundkumar 9. Hypertension.  Vitals:   03/08/19 0449 03/08/19 0836  BP: (!) 147/64 (!) 144/68  Pulse: 86   Resp: 18   Temp: 98.3 F (36.8 C)   SpO2: 94%    Norvasc 10 mg daily, decreased to 5 mg on 4/26  Remains slightly elevated, will consider further increase in medications tomorrow if persistently elevated 10.  Hyperlipidemia. Lipitor 11. Tobacco abuse. Counseling 12.  Hyperglycemia  Hemoglobin A1c 5.5 on 01/2019  P.o. intake remains inconsistent on 5/7  Continue to monitor with increased activity 13.  Acute blood loss anemia  Hemoglobin 11.2 on 5/4  Continue to monitor 14.  Hypoalbuminemia  Supplement initiated on 4/27 15.  Slow transit constipation  Bowel regimen increased on 5/3, increased again on 5/4  Improving on 5/5 16. Low grade fevers: Improving overall  WBCs 13.4 on 5/4, labs pending  UA appears to be positive, urine culture with MRSA  Continue Macrobid started on 5/6  Likely source of lethargy and confusion, however if no improvement tomorrow will consider repeat CT scan as well given right lower extremity weakness  Will consider further workup if necessary   LOS: 13 days A FACE TO FACE EVALUATION WAS PERFORMED  Ankit Karis JubaAnil Patel 03/08/2019, 11:09 AM

## 2019-03-08 NOTE — Progress Notes (Signed)
Occupational Therapy Session Note  Patient Details  Name: Connie Oconnor MRN: [id number] Date of Birth: 1941-08-27  Today's Date: 03/08/2019 OT Individual Time: 6269-4854 OT Individual Time Calculation (min): 55 min    Short Term Goals: Week 2:  OT Short Term Goal 1 (Week 2): Pt will consistently transfer to toilet/BSC wiht MIN A OT Short Term Goal 2 (Week 2): pt will thread BUE into shirt wiht S OT Short Term Goal 3 (Week 2): Pt will recall hemi techniques iwht min VC OT Short Term Goal 4 (Week 2): Pt will thread 1LE into pants wiht VC  Skilled Therapeutic Interventions/Progress Updates:    Pt resting in bed upon arrival, no covers and lying diagonally in bed.  Pt's dentures lying on bed beside her.  Pt questioned on location of dentures and pt stated she took them out but was unable to locate them on bed. Pt incontinent of bladder but was unaware.  Pt required max A for toileting at bed level.  Pt required mod a with max verbal cues for rolling R<>L to facilitate placement of brief.  Pt required tot A for donning pants at bed level.  Pt required mod A for supine>sit EOB and max A for sitting balance EOB.  Pt continues to exhibit significant lean to L requiring min/mod A to correct. Pt required max A for stand pivot transfer to w/c but was able to reposition in w/c with min A.  Pt required min A for grooming tasks at sink.  RN notified that pt on night bath to allow pt to focus on dressing tasks.  Pt requires increased time to initiate tasks and is limited by RLE groin pain with all transitional movements. Pt remained in TIS w/c with belt alarm activated and half lap tray in place.  Pt instructed on use of TV controls but pt persistently pushed staff call button instead.   Therapy Documentation Precautions:  Precautions Precautions: Fall Precaution Comments: L hemi, back pain, needs time to initiate movements  Restrictions Weight Bearing Restrictions: No Pain: Pain Assessment Pain Scale:  Faces Pain Score: 0-No pain Pain Location: Leg Pain Orientation: Right Pain Descriptors / Indicators: Discomfort Pain Onset: On-going Pain Intervention(s): Repositioned   Therapy/Group: Individual Therapy  Rich Brave 03/08/2019, 11:34 AM

## 2019-03-08 NOTE — Progress Notes (Signed)
Physical Therapy Session Note  Patient Details  Name: Connie Oconnor MRN: [id number] Date of Birth: [birth year]  Today's Date: 03/08/2019 PT Individual Time: 1330-1445 PT Individual Time Calculation (min): 75 min   Short Term Goals: Week 2:  PT Short Term Goal 1 (Week 2): pt will perform stand pivot transfers with mod assist consistently PT Short Term Goal 2 (Week 2): pt will initiate gait training PT Short Term Goal 3 (Week 2): pt will balance in sitting wiht feet supported, reaching within BOS , wiht min assist  Skilled Therapeutic Interventions/Progress Updates:  Pt sitting in tilt in space w/c, dozing.  She denies pain.  Therapeutic activity seated in w/c, folding pillow cases and washcloths using bil hands (mod /maxassist for L) on table in front of her. Activity in room was to pt's L; she frequently turned her had L to follow noises.  With strap holding L hand on 1# weighted bar, beach ball volley x 20.  Scooting forward in upright w/c with mod assist, at pt's slow requested speed.  Pull to stand in parallel bars, with min assist.  Pt tolerated <10 seconds in standing, due to RLE pain, described as cramping.  neuromuscular re-education via forced use, multimodal cues for alternating reciprocal movement bil LEs using KInetron from w/c level, resistance 60 cm/sec, x 20 cycles x 2.    At end of session, pt sitting in tilt in space w/c, tilted back, with LUE supported by 1/2 lap tray, needs at hand an seat belt alarm set.     Therapy Documentation Precautions:  Precautions Precautions: Fall Precaution Comments: L hemi, back pain, needs time to initiate movements  Restrictions Weight Bearing Restrictions: No   Vital Signs: Therapy Vitals Pulse Rate: 76 BP: (!) 137/57 Patient Position (if appropriate): Sitting Pain: Pain Assessment Pain Scale: Faces Pain Score: 0-No pain at rest       Therapy/Group: Individual Therapy  Tytionna Cloyd 03/08/2019, 2:50 PM

## 2019-03-09 ENCOUNTER — Inpatient Hospital Stay (HOSPITAL_COMMUNITY): Payer: Medicare Other

## 2019-03-09 ENCOUNTER — Inpatient Hospital Stay (HOSPITAL_COMMUNITY): Payer: Medicare Other | Admitting: Speech Pathology

## 2019-03-09 NOTE — Progress Notes (Signed)
Speech Language Pathology Weekly Progress and Session Note  Patient Details  Name: Connie Oconnor MRN: [id number] Date of Birth: [birth year]  Beginning of progress report period: Mar 02, 2019 End of progress report period: Mar 09, 2019  Today's Date: 03/09/2019 SLP Individual Time: 0730-0800 SLP Individual Time Calculation (min): 30 min  Short Term Goals: Week 2: SLP Short Term Goal 1 (Week 2): Patient will demonstrate functional problem solving for basic tasks with Min A verbal cues.  SLP Short Term Goal 1 - Progress (Week 2): Not met SLP Short Term Goal 2 (Week 2): Patient will demonstrate sustained attention to functional tasks for 15 minutes with Mod A verbal cues for redirection.  SLP Short Term Goal 2 - Progress (Week 2): Not met SLP Short Term Goal 3 (Week 2): Patient will recall new, daily information with Min A multimodal cues.  SLP Short Term Goal 3 - Progress (Week 2): Not met    New Short Term Goals: Week 3: SLP Short Term Goal 1 (Week 3): Patient will demonstrate functional problem solving for basic tasks with Mod A verbal cues.  SLP Short Term Goal 2 (Week 3): Patient will demonstrate sustained attention to functional tasks for 15 minutes with Mod A verbal cues for redirection.  SLP Short Term Goal 3 (Week 3): Patient will recall new, daily information with Mod A multimodal cues.   Weekly Progress Updates: Patient continues to make limited progress and has not met any STGs this reporting period. However, suspect function has been impacted by UTI that is currently being treated which has caused increased fatigue and confusion. Currently, patient requires overall Max A multimodal cues for functional problem solving, sustained attention and recall with use of compensatory strategies. Patient and family education ongoing. Patient would benefit from continued skilled SLP intervention to maximize her cognitive functioning prior to discharge.      Intensity: Minumum of 1-2 x/day, 30  to 90 minutes Frequency: 3 to 5 out of 7 days Duration/Length of Stay: TBD due to possible SNF placement  Treatment/Interventions: Cognitive remediation/compensation;Environmental controls;Internal/external aids;Speech/Language facilitation;Therapeutic Activities;Patient/family education;Functional tasks;Cueing hierarchy;Dysphagia/aspiration precaution training   Daily Session  Skilled Therapeutic Interventions: Skilled treatment session focused on cognitive goals. Upon arrival, patient was awake while eating breakfast. Patient appeared more alert this session but continues to demonstrate intermittent confusion. Patient required Max A verbal and visual cues for orientation to place, situation and time and total A to recall that she had children. However, once told she had 2 daughters, she was able to independently verbalize their names. Patient requested to call her daughter and SLP provided extra time and eventual total A to dial number after patient attempted 3 times without success due to poor attention to task. Patient left upright in bed with alarm on and all needs within reach. Continue with current plan of care.        Pain No/Denies Pain   Therapy/Group: Individual Therapy  Yatziri Wainwright 03/09/2019, 6:42 AM

## 2019-03-09 NOTE — Progress Notes (Signed)
[place] PHYSICAL MEDICINE & REHABILITATION PROGRESS NOTE   Subjective/Complaints: Patient seen sitting up in bed this morning.  She states she slept well overnight.  Discussed with SLP patient's ongoing confusion, but some improvement.  ROS: Denies CP, SOB, abdominal pain, N/V/D  Objective:   No results found. Recent Labs    03/08/19 1153  WBC 15.7*  HGB 10.3*  HCT 31.3*  PLT 509*   Recent Labs    03/07/19 0927  NA 131*  K 3.8  CL 94*  CO2 23  GLUCOSE 158*  BUN 20  CREATININE 0.89  CALCIUM 8.7*    Intake/Output Summary (Last 24 hours) at 03/09/2019 1027 Last data filed at 03/09/2019 0052 Gross per 24 hour  Intake 120 ml  Output 625 ml  Net -505 ml     Physical Exam: Vital Signs Blood pressure 106/72, pulse 77, temperature 98.6 F (37 C), resp. rate 17, height 5\' 5"  (1.651 m), weight 68.5 kg, SpO2 98 %. Constitutional: No distress . Vital signs reviewed. HENT: Normocephalic.  Atraumatic. Eyes: EOMI.  No discharge. Cardiovascular: No JVD. Respiratory: Normal effort. GI: Nondistended. Musc: +TTP right thigh Neurologic: Alert and and oriented x2, improved from yesterday Motor:  Left upper extremity: Shoulder abduction 3/5, distally 4-4+/5 Left lower extremity: 4 +/5 proximal to distal, stable Right lower extremity: 2/5 hip flexion, knee extension (pain inhibition, stable) Skin: Warm and dry.  Intact.  Assessment/Plan: 1. Functional deficits secondary to right corona radiata infarct which require 3+ hours per day of interdisciplinary therapy in a comprehensive inpatient rehab setting.  Physiatrist is providing close team supervision and 24 hour management of active medical problems listed below.  Physiatrist and rehab team continue to assess barriers to discharge/monitor patient progress toward functional and medical goals  Care Tool:  Bathing  Bathing activity did not occur: Safety/medical concerns Body parts bathed by patient: Left arm, Abdomen,  Chest, Front perineal area, Right upper leg, Left upper leg, Face   Body parts bathed by helper: Right arm, Buttocks, Right lower leg, Left lower leg     Bathing assist Assist Level: Moderate Assistance - Patient 50 - 74%     Upper Body Dressing/Undressing Upper body dressing   What is the patient wearing?: Pull over shirt    Upper body assist Assist Level: Maximal Assistance - Patient 25 - 49%    Lower Body Dressing/Undressing Lower body dressing      What is the patient wearing?: Pants, Incontinence brief     Lower body assist Assist for lower body dressing: Total Assistance - Patient < 25%     Toileting Toileting    Toileting assist Assist for toileting: Maximal Assistance - Patient 25 - 49%     Transfers Chair/bed transfer  Transfers assist     Chair/bed transfer assist level: Moderate Assistance - Patient 50 - 74%     Locomotion Ambulation   Ambulation assist   Ambulation activity did not occur: Safety/medical concerns(fatigue/pain )  Assist level: Moderate Assistance - Patient 50 - 74% Assistive device: Walker-rolling(and L hand splint) Max distance: 5'   Walk 10 feet activity   Assist  Walk 10 feet activity did not occur: Safety/medical concerns(fatigue/pain )        Walk 50 feet activity   Assist Walk 50 feet with 2 turns activity did not occur: Safety/medical concerns(fatigue/pain )         Walk 150 feet activity   Assist Walk 150 feet activity did not occur: Safety/medical concerns(fatigue/pain )  Walk 10 feet on uneven surface  activity   Assist Walk 10 feet on uneven surfaces activity did not occur: Safety/medical concerns(fatigue/pain )         Wheelchair     Assist Will patient use wheelchair at discharge?: Yes Type of Wheelchair: Manual    Wheelchair assist level: Supervision/Verbal cueing Max wheelchair distance: 27'    Wheelchair 50 feet with 2 turns activity    Assist    Wheelchair 50  feet with 2 turns activity did not occur: Safety/medical concerns(fatigue/pain )       Wheelchair 150 feet activity     Assist Wheelchair 150 feet activity did not occur: Safety/medical concerns(fatigue/pain )        Medical Problem List and Plan: 1.Left side weaknesssecondary to 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  Cont CIR Plan outpatient coiling of aneurysm per interventional radiology  Will consider anti-spasticity medications if necessary, not appropriate at this point  Discussed with therapies previously- motivation/pain likely limiting RLE 2. Antithrombotics: -DVT/anticoagulation:SCDs.  Lower extremity Dopplers negative DVT -antiplatelet therapy: aspirin 81 mg daily Plavix 75 mg daily 3 weeks and Plavix alone 3. Pain Management/migraine headaches:Tramadolasneeded Scheduled Flexeril (home med) for right hand cramps/back Added kpad for low back  Muscle rub ordered for left calf- improved  Will add muscle rub to thigh 4. Mood:Provide emotional support -antipsychotic agents: N/A 5. Neuropsych: This patientis capable of making decisions on herown behalf.  Continue tele-sitter for safety  Needs significant encouragement 6. Skin/Wound Care:Routine skin checks 7. Fluids/Electrolytes/Nutrition:Routine in and outs  D3 thins, advance diet as tolerated 8. History of traumatic [facility] May 2018. Patient seen in the past by Dr. Conchita Paris 9. Hypertension.  Vitals:   03/08/19 1927 03/09/19 0509  BP: (!) 140/53 106/72  Pulse: 82 77  Resp: 18 17  Temp: 98 F (36.7 C) 98.6 F (37 C)  SpO2: 98%    Norvasc 10 mg daily, decreased to 5 mg on 4/26  Extremely labile on 5/8, monitor for trend 10. Hyperlipidemia. Lipitor 11. Tobacco abuse. Counseling 12.   Hyperglycemia  Hemoglobin A1c 5.5 on 01/2019  P.o. intake remains inconsistent on 5/8  Patient needs significant time and cueing to encourage intake.  Continue to monitor with increased activity 13.  Acute blood loss anemia  Hemoglobin 10.3 on 5/7, labs ordered for Monday  Hemoccult ordered  Continue to monitor 14.  Hypoalbuminemia  Supplement initiated on 4/27 15.  Slow transit constipation  Bowel regimen increased on 5/3, increased again on 5/4  Improving on 5/8 16. Low grade fevers: Improving  WBCs 15.7 on 5/7, labs ordered for Monday  Urine culture with MRSA  Continue Macrobid started on 5/6  Likely source of lethargy and confusion, which appear to be improving  Will consider further workup if necessary   LOS: 14 days A FACE TO FACE EVALUATION WAS PERFORMED  Esty Ahuja Karis Juba 03/09/2019, 10:27 AM

## 2019-03-09 NOTE — Progress Notes (Signed)
Physical Therapy Note  Patient Details  Name: Connie Oconnor MRN: [id number] Date of Birth: Mar 09, 1941 Today's Date: 03/09/2019    Patient complaining of sever R hip pain, unable to provide a number rating this afternoon. PT examined R hip and patient crying out in pain with minimal movement of R LE and to palpation of R hip. Patient refused to attempt to get out of bed or participate in bed level exercises due to pain. Patient was repositioned for comfort. Patient missed 60 minutes of skilled PT due to R hip pain, RN notified.   Erielle Gawronski L Chieko Neises PT, DPT  03/09/2019, 4:35 PM

## 2019-03-09 NOTE — Progress Notes (Signed)
Occupational Therapy Weekly Progress Note  Patient Details  Name: Connie Oconnor MRN: [id number] Date of Birth: [birth year]  Beginning of progress report period: Mar 02, 2019 End of progress report period: Mar 09, 2019  Patient has met 1 of 4 short term goals.  Pt made minimal and inconsistent progress during the past week.  Pt continues to require max A for bathing and dressing tasks.  Pt requires mod A for supine>sit and mod/max A for sitting balance EOB.  Pt requires mod/max A for stand pivot tranfsers EOB>w/c.  Pt requires max A to initiate all bathing/dressing tasks and sit<>stand from w/c at sink to engaged in LB dressing tasks.  Pt requires mod/max verbal cues for orientation.  Pt's discharge plan has changed to SNF since family states they are unable to provided 24 hour care/assistance.   Patient continues to demonstrate the following deficits: muscle weakness, decreased cardiorespiratoy endurance, impaired timing and sequencing, unbalanced muscle activation, motor apraxia and decreased coordination, decreased initiation, decreased attention, decreased awareness, decreased problem solving, decreased safety awareness and decreased memory and decreased sitting balance, decreased standing balance, decreased postural control, hemiplegia and decreased balance strategies and therefore will continue to benefit from skilled OT intervention to enhance overall performance with BADL and Reduce care partner burden.  Patient progressing toward long term goals..  Continue plan of care.  OT Short Term Goals Week 2:  OT Short Term Goal 1 (Week 2): Pt will consistently transfer to toilet/BSC wiht MIN A OT Short Term Goal 1 - Progress (Week 2): Progressing toward goal OT Short Term Goal 2 (Week 2): pt will thread BUE into shirt wiht S OT Short Term Goal 2 - Progress (Week 2): Progressing toward goal OT Short Term Goal 3 (Week 2): Pt will recall hemi techniques iwht min VC OT Short Term Goal 3 - Progress  (Week 2): Progressing toward goal OT Short Term Goal 4 (Week 2): Pt will thread 1LE into pants wiht VC OT Short Term Goal 4 - Progress (Week 2): Met Week 3:  OT Short Term Goal 1 (Week 3): STG=LTG 2/2 ELOS (LTG downgraded 5/7)   Leotis Shames [facility] 03/09/2019, 6:44 AM

## 2019-03-09 NOTE — Progress Notes (Signed)
Occupational Therapy Session Note  Patient Details  Name: Connie Oconnor MRN: [id number] Date of Birth: 1940-12-16  Today's Date: 03/09/2019 OT Individual Time: 3818-4037 OT Individual Time Calculation (min): 85 min    Short Term Goals: Week 3:  OT Short Term Goal 1 (Week 3): STG=LTG 2/2 ELOS  Skilled Therapeutic Interventions/Progress Updates:    Pt asleep in bed upon arrival with coffee cup in hand.  Pt had spilled coffee and brief needed to be changed.  Attempted to change brief from sitting position with sit<>stand.  Pt unable to maintain sitting balance EOB to facilitate threading of brief and pants and returned to supine.  Pt followed directions for rolling this morning with min verbal cues and mod A to R and min A to L.  Pt required tot A for changing brief but was able to bathe front perineal area. Pt required max A for donning pants at bed level.  Pt required mod A for supine>sit EOB in preparation for transfer to TIS w/c. Pt required mod A for sitting balance EOB.  Pt acknowledges that she is leaning L but unable to self correct.  Pt required max A for stand pivot transfer to w/c. Pt engaged in UB bathing tasks seated in w/c at sink.  Pt initiates use of LUE in bathing/dressing tasks but requires assistance with completion. Pt able to thread RUE into shirt sleeve and initiated threading LUE into shirt sleeve after donned over head. Pt transitioned to gym and engaged in LUE NMR forced use and reaching for and grasping objects with LUE. Pt initially engaged in tasks but became lethargic and required max multimodal cues to keep eyes open and engage.  Pt returned to room and remained seated in TIS with belt alarm activated and half lap tray in place. All needs within reach.   Therapy Documentation Precautions:  Precautions Precautions: Fall Precaution Comments: L hemi, back pain, needs time to initiate movements  Restrictions Weight Bearing Restrictions: No   Pain:  Pt with ongoing c/o  RLE (groin) discomfort with movment; emotional support and repositioned   Therapy/Group: Individual Therapy  Rich Brave 03/09/2019, 10:46 AM

## 2019-03-09 NOTE — Progress Notes (Signed)
Occupational Therapy Session Note  Patient Details  Name: Alyse Schnautz MRN: [id number] Date of Birth: 1941-04-29  Today's Date: 03/09/2019 OT Individual Time: 1100-1130 OT Individual Time Calculation (min): 30 min    Short Term Goals: Week 3:  OT Short Term Goal 1 (Week 3): STG=LTG 2/2 ELOS  Skilled Therapeutic Interventions/Progress Updates:    Pt resting in w/c upon arrival.  Pt's eyes closed but aroused with min verbal cues.  OT intervention with focus on forced used of LUE for functional grasp release of cup.  Pt requires HOH assist with max multimodal cues.  Pt required max verbal cues to attend to task and keep eyes open.  Pt remained in w/c with belt alarm activated and half lap tray in place. All needs within reach.   Therapy Documentation Precautions:  Precautions Precautions: Fall Precaution Comments: L hemi, back pain, needs time to initiate movements  Restrictions Weight Bearing Restrictions: No   Pain:  Pt with no c/o pain while seated in w/c   Therapy/Group: Individual Therapy  Rich Brave 03/09/2019, 11:37 AM

## 2019-03-10 ENCOUNTER — Inpatient Hospital Stay (HOSPITAL_COMMUNITY): Payer: Medicare Other

## 2019-03-10 LAB — GLUCOSE, CAPILLARY: Glucose-Capillary: 122 mg/dL — ABNORMAL HIGH (ref 70–99)

## 2019-03-10 LAB — CBC WITH DIFFERENTIAL/PLATELET
Abs Immature Granulocytes: 0.06 10*3/uL (ref 0.00–0.07)
Basophils Absolute: 0 10*3/uL (ref 0.0–0.1)
Basophils Relative: 0 %
Eosinophils Absolute: 0.1 10*3/uL (ref 0.0–0.5)
Eosinophils Relative: 1 %
HCT: 27.5 % — ABNORMAL LOW (ref 36.0–46.0)
Hemoglobin: 9 g/dL — ABNORMAL LOW (ref 12.0–15.0)
Immature Granulocytes: 1 %
Lymphocytes Relative: 7 %
Lymphs Abs: 0.9 10*3/uL (ref 0.7–4.0)
MCH: 27.2 pg (ref 26.0–34.0)
MCHC: 32.7 g/dL (ref 30.0–36.0)
MCV: 83.1 fL (ref 80.0–100.0)
Monocytes Absolute: 0.9 10*3/uL (ref 0.1–1.0)
Monocytes Relative: 8 %
Neutro Abs: 9.7 10*3/uL — ABNORMAL HIGH (ref 1.7–7.7)
Neutrophils Relative %: 83 %
Platelets: 461 10*3/uL — ABNORMAL HIGH (ref 150–400)
RBC: 3.31 MIL/uL — ABNORMAL LOW (ref 3.87–5.11)
RDW: 13.4 % (ref 11.5–15.5)
WBC: 11.7 10*3/uL — ABNORMAL HIGH (ref 4.0–10.5)
nRBC: 0 % (ref 0.0–0.2)

## 2019-03-10 LAB — OCCULT BLOOD X 1 CARD TO LAB, STOOL: Fecal Occult Bld: POSITIVE — AB

## 2019-03-10 NOTE — Plan of Care (Signed)
  Problem: RH BLADDER ELIMINATION Goal: RH STG MANAGE BLADDER WITH ASSISTANCE Description STG Manage Bladder With Assistance Min  Outcome: Not Progressing; in and out cath   Problem: RH SAFETY Goal: RH STG ADHERE TO SAFETY PRECAUTIONS W/ASSISTANCE/DEVICE Description STG Adhere to Safety Precautions With Assistance/Device. Min  Outcome: Not Progressing; telesitter

## 2019-03-10 NOTE — Progress Notes (Signed)
[facility] PHYSICAL MEDICINE & REHABILITATION PROGRESS NOTE   Subjective/Complaints: Patient had a reasonable night.  No complaints this morning including pain.  ROS: Limited due to cognitive/behavioral   Objective:   No results found. Recent Labs    03/08/19 1153 03/10/19 0527  WBC 15.7* 11.7*  HGB 10.3* 9.0*  HCT 31.3* 27.5*  PLT 509* 461*   Recent Labs    03/07/19 0927  NA 131*  K 3.8  CL 94*  CO2 23  GLUCOSE 158*  BUN 20  CREATININE 0.89  CALCIUM 8.7*    Intake/Output Summary (Last 24 hours) at 03/10/2019 0848 Last data filed at 03/10/2019 0753 Gross per 24 hour  Intake 500 ml  Output 842 ml  Net -342 ml     Physical Exam: Vital Signs Blood pressure (!) 138/55, pulse 72, temperature 99.7 F (37.6 C), temperature source Oral, resp. rate 20, height 5\' 5"  (1.651 m), weight 68.5 kg, SpO2 94 %. Constitutional: No distress . Vital signs reviewed. HEENT: EOMI, oral membranes moist Neck: supple Cardiovascular: RRR without murmur. No JVD    Respiratory: CTA Bilaterally without wheezes or rales. Normal effort    GI: BS +, non-tender, non-distended  Musc: Remains +TTP right thigh Neurologic: Alert and and oriented x2 and follows basic commands Motor:  Left upper extremity: Shoulder abduction 3/5, distally 4-4+/5 Left lower extremity: 4 +/5 proximal to distal, stable Right lower extremity: 2/5 hip flexion, knee extension (pain inhibition, stable) Skin: Warm and dry.  Intact.  Assessment/Plan: 1. Functional deficits secondary to right corona radiata infarct which require 3+ hours per day of interdisciplinary therapy in a comprehensive inpatient rehab setting.  Physiatrist is providing close team supervision and 24 hour management of active medical problems listed below.  Physiatrist and rehab team continue to assess barriers to discharge/monitor patient progress toward functional and medical goals  Care Tool:  Bathing  Bathing activity did not occur:  Safety/medical concerns Body parts bathed by patient: Left arm, Abdomen, Chest, Front perineal area, Right upper leg, Left upper leg, Face   Body parts bathed by helper: Right arm, Buttocks, Right lower leg, Left lower leg     Bathing assist Assist Level: Moderate Assistance - Patient 50 - 74%     Upper Body Dressing/Undressing Upper body dressing   What is the patient wearing?: Pull over shirt    Upper body assist Assist Level: Moderate Assistance - Patient 50 - 74%    Lower Body Dressing/Undressing Lower body dressing      What is the patient wearing?: Pants, Incontinence brief     Lower body assist Assist for lower body dressing: Maximal Assistance - Patient 25 - 49%     Toileting Toileting    Toileting assist Assist for toileting: Maximal Assistance - Patient 25 - 49%     Transfers Chair/bed transfer  Transfers assist     Chair/bed transfer assist level: Moderate Assistance - Patient 50 - 74%     Locomotion Ambulation   Ambulation assist   Ambulation activity did not occur: Safety/medical concerns(fatigue/pain )  Assist level: Moderate Assistance - Patient 50 - 74% Assistive device: Walker-rolling(and L hand splint) Max distance: 5'   Walk 10 feet activity   Assist  Walk 10 feet activity did not occur: Safety/medical concerns(fatigue/pain )        Walk 50 feet activity   Assist Walk 50 feet with 2 turns activity did not occur: Safety/medical concerns(fatigue/pain )         Walk 150 feet  activity   Assist Walk 150 feet activity did not occur: Safety/medical concerns(fatigue/pain )         Walk 10 feet on uneven surface  activity   Assist Walk 10 feet on uneven surfaces activity did not occur: Safety/medical concerns(fatigue/pain )         Wheelchair     Assist Will patient use wheelchair at discharge?: Yes Type of Wheelchair: Manual    Wheelchair assist level: Supervision/Verbal cueing Max wheelchair distance: 9615'     Wheelchair 50 feet with 2 turns activity    Assist    Wheelchair 50 feet with 2 turns activity did not occur: Safety/medical concerns(fatigue/pain )       Wheelchair 150 feet activity     Assist Wheelchair 150 feet activity did not occur: Safety/medical concerns(fatigue/pain )        Medical Problem List and Plan: 1.Left side weaknesssecondary to 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  Cont CIR Plan outpatient coiling of aneurysm per interventional radiology  Will consider anti-spasticity medications if necessary, not appropriate at this point  Pain and motivation can be limiting at times 2. Antithrombotics: -DVT/anticoagulation:SCDs.  Lower extremity Dopplers negative DVT -antiplatelet therapy: aspirin 81 mg daily Plavix 75 mg daily 3 weeks and Plavix alone 3. Pain Management/migraine headaches:Tramadolasneeded Scheduled Flexeril (home med) for right hand cramps/back Added kpad for low back  Muscle rub ordered for left calf and right thigh- improved    4. Mood:Provide emotional support -antipsychotic agents: N/A 5. Neuropsych: This patientis capable of making decisions on herown behalf.  Continue tele-sitter for safety  Needs significant encouragement 6. Skin/Wound Care:Routine skin checks 7. Fluids/Electrolytes/Nutrition:Routine in and outs  D3 thins, advance diet as tolerated 8. [facility] Patient seen in the past by Dr. Conchita ParisNundkumar 9. Hypertension.  Vitals:   03/09/19 1943 03/10/19 0506  BP: (!) 126/51 (!) 138/55  Pulse: 65 72  Resp:  20  Temp:  99.7 F (37.6 C)  SpO2:  94%   Norvasc 10 mg daily, decreased to 5 mg on 4/26  Some improvement 5/9 10. Hyperlipidemia. Lipitor 11. Tobacco abuse. Counseling 12.   Hyperglycemia  Hemoglobin A1c 5.5 on 01/2019  P.o. intake remains sporadic 5/9  Patient needs significant time and cueing to encourage intake.  Sugars under reasonable control at present 13.  Acute blood loss anemia  Hemoglobin 10.3 on 5/7, labs ordered for Monday  Hemoccult ordered and pending  Continue to monitor 14.  Hypoalbuminemia  Supplement initiated on 4/27 15.  Slow transit constipation  Bowel regimen increased on 5/3, increased again on 5/4  Improving on 5/8 16. Low grade fevers: Improving  WBCs 15.7 on 5/7, labs ordered for Monday  Urine culture with MRSA  Continue Macrobid started on 5/6 (sensitive)        LOS: 15 days A FACE TO FACE EVALUATION WAS PERFORMED  Ranelle OysterZachary T Franklyn Cafaro 03/10/2019, 8:48 AM

## 2019-03-10 NOTE — Progress Notes (Signed)
Occupational Therapy Session Note  Patient Details  Name: Connie Oconnor MRN: [id number] Date of Birth: October 05, 1941  Today's Date: 03/10/2019 OT Individual Time: 0900-1000 OT Individual Time Calculation (min): 60 min    Short Term Goals: Week 3:  OT Short Term Goal 1 (Week 3): STG=LTG 2/2 ELOS  Skilled Therapeutic Interventions/Progress Updates:    Pt resting in bed upon arrival and greeted therapist by name.  Pt stated she needed to use toilet. Pt required mod A for supine>sit EOB in preparation for transfer to [facility].  Pt completed washing her face and donning clean shirt while seated on BSC.  Pt continues to c/o increased pain in RLE/groin with any movement or repositioning. Pt hesitant to put weight through RLE which impairs ability to assist with transfers.  Pt required +2 for clothing management during toileting.  Pt initiating LUE use in functional tasks but requires assistance to complete tasks when using LUE.  Pt requires increased time to initiate tasks when given directions. Pt remained seated in TIS w/c with belt alarm activated and half lap tray in place.  All needs within reach.   Therapy Documentation Precautions:  Precautions Precautions: Fall Precaution Comments: L hemi, back pain, needs time to initiate movements  Restrictions Weight Bearing Restrictions: No  Pain:  Pt c/o increased pain in RLE/groin with any movement; RN aware   Therapy/Group: Individual Therapy  Rich Brave 03/10/2019, 12:23 PM

## 2019-03-11 LAB — OCCULT BLOOD X 1 CARD TO LAB, STOOL: Fecal Occult Bld: NEGATIVE

## 2019-03-11 MED ORDER — POLYSACCHARIDE IRON COMPLEX 150 MG PO CAPS
150.0000 mg | ORAL_CAPSULE | Freq: Every day | ORAL | Status: DC
  Administered 2019-03-11 – 2019-03-27 (×17): 150 mg via ORAL
  Filled 2019-03-11 (×17): qty 1

## 2019-03-11 NOTE — Progress Notes (Signed)
Connie Oconnor PHYSICAL MEDICINE & REHABILITATION PROGRESS NOTE   Subjective/Complaints: Pt denies any issues overnight. RN informed me that she had a heme + stool.   ROS: Limited due to cognitive/behavioral   Objective:   No results found. Recent Labs    03/08/19 1153 03/10/19 0527  WBC 15.7* 11.7*  HGB 10.3* 9.0*  HCT 31.3* 27.5*  PLT 509* 461*   No results for input(s): NA, K, CL, CO2, GLUCOSE, BUN, CREATININE, CALCIUM in the last 72 hours.  Intake/Output Summary (Last 24 hours) at 03/11/2019 1101 Last data filed at 03/11/2019 0819 Gross per 24 hour  Intake 840 ml  Output 350 ml  Net 490 ml     Physical Exam: Vital Signs Blood pressure (!) 123/41, pulse 79, temperature 99.5 F (37.5 C), temperature source Oral, resp. rate 20, height 5\' 5"  (1.651 m), weight 68.5 kg, SpO2 93 %. Constitutional: No distress . Vital signs reviewed. Constitutional: No distress . Vital signs reviewed. HEENT: EOMI, oral membranes moist Neck: supple Cardiovascular: RRR without murmur. No JVD    Respiratory: CTA Bilaterally without wheezes or rales. Normal effort    GI: BS +, non-tender, non-distended   Musc: Remains somewhat +TTP right thigh Neurologic: Alert and and oriented x2 and follows basic commands Motor:  Left upper extremity: Shoulder abduction 3/5, distally 4-4+/5 Left lower extremity: 4 +/5 proximal to distal---stable motor exam Right lower extremity: 2/5 hip flexion, knee extension (pain inhibition, no change) Skin: Warm and dry.  Intact.  Assessment/Plan: 1. Functional deficits secondary to right corona radiata infarct which require 3+ hours per day of interdisciplinary therapy in a comprehensive inpatient rehab setting.  Physiatrist is providing close team supervision and 24 hour management of active medical problems listed below.  Physiatrist and rehab team continue to assess barriers to discharge/monitor patient progress toward functional and medical goals  Care  Tool:  Bathing  Bathing activity did not occur: Safety/medical concerns Body parts bathed by patient: Left arm, Abdomen, Chest, Front perineal area, Right upper leg, Left upper leg, Face   Body parts bathed by helper: Right arm, Buttocks, Right lower leg, Left lower leg     Bathing assist Assist Level: Moderate Assistance - Patient 50 - 74%     Upper Body Dressing/Undressing Upper body dressing   What is the patient wearing?: Pull over shirt    Upper body assist Assist Level: Moderate Assistance - Patient 50 - 74%    Lower Body Dressing/Undressing Lower body dressing      What is the patient wearing?: Pants, Incontinence brief     Lower body assist Assist for lower body dressing: Maximal Assistance - Patient 25 - 49%     Toileting Toileting    Toileting assist Assist for toileting: 2 Helpers     Transfers Chair/bed transfer  Transfers assist     Chair/bed transfer assist level: Moderate Assistance - Patient 50 - 74%     Locomotion Ambulation   Ambulation assist   Ambulation activity did not occur: Safety/medical concerns(fatigue/pain )  Assist level: Moderate Assistance - Patient 50 - 74% Assistive device: Walker-rolling(and L hand splint) Max distance: 5'   Walk 10 feet activity   Assist  Walk 10 feet activity did not occur: Safety/medical concerns(fatigue/pain )        Walk 50 feet activity   Assist Walk 50 feet with 2 turns activity did not occur: Safety/medical concerns(fatigue/pain )         Walk 150 feet activity   Assist Walk 150  feet activity did not occur: Safety/medical concerns(fatigue/pain )         Walk 10 feet on uneven surface  activity   Assist Walk 10 feet on uneven surfaces activity did not occur: Safety/medical concerns(fatigue/pain )         Wheelchair     Assist Will patient use wheelchair at discharge?: Yes Type of Wheelchair: Manual    Wheelchair assist level: Supervision/Verbal cueing Max  wheelchair distance: 15'    Wheelchair 50 feet with 2 turns activity    Assist    Wheelchair 50 feet with 2 turns activity did not occur: Safety/medical concerns(fatigue/pain )       Wheelchair 150 feet activity     Assist Wheelchair 150 feet activity did not occur: Safety/medical concerns(fatigue/pain )        Medical Problem List and Plan: 1.Left side weaknesssecondary to 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  Cont CIR Plan outpatient coiling of aneurysm per interventional radiology  Will consider anti-spasticity medications if necessary, not appropriate at this point  Pain and motivation remain limiting at times  2. Antithrombotics: -DVT/anticoagulation:SCDs.  Lower extremity Dopplers negative DVT -antiplatelet therapy: aspirin 81 mg daily Plavix 75 mg daily 3 weeks and Plavix alone 3. Pain Management/migraine headaches:Tramadolasneeded Scheduled Flexeril (home med) for right hand cramps/back Added kpad for low back  Muscle rub ordered for left calf and right thigh- improved    4. Mood:Provide emotional support -antipsychotic agents: N/A 5. Neuropsych: This patientis capable of making decisions on herown behalf.  Continue tele-sitter for safety  Needs significant encouragement at times 6. Skin/Wound Care:Routine skin checks 7. Fluids/Electrolytes/Nutrition:Routine in and outs  D3 thins, advance diet as tolerated 8. History of traumatic [facility] May 2018. Patient seen in the past by Dr. Conchita Paris 9. Hypertension.  Vitals:   03/10/19 2016 03/11/19 0503  BP: (!) 128/51 (!) 123/41  Pulse: 83 79  Resp: 20 20  Temp: 99.5 F (37.5 C) 99.5 F (37.5 C)  SpO2: 94% 93%   Norvasc 10 mg daily, decreased to 5 mg on 4/26  BP well controlled 5/10 10. Hyperlipidemia.  Lipitor 11. Tobacco abuse. Counseling 12.  Hyperglycemia  Hemoglobin A1c 5.5 on 01/2019  P.o. intake remains sporadic 5/9  Patient needs significant time and cueing to encourage intake.  Sugars under reasonable control at present 13.  Acute blood loss anemia   Hemoccult ordered and positive, repeat another today  -no gross GI bleeding  Hgb down to 9.0 5/9 and has been slowly trending down since admit  -on plavix and 81 ECASA for cva proph  -recheck cbc in AM, order iron panel  -add Fe++ supp  -consider GI consult 14.  Hypoalbuminemia  Supplement initiated on 4/27 15.  Slow transit constipation  Bowel regimen increased on 5/3, increased again on 5/4  Improving on 5/8 16. Low grade fevers: Improving  WBCs 15.7 on 5/7, labs ordered for Monday  Urine culture with MRSA  Continue Macrobid started on 5/6 (sensitive)        LOS: 16 days A FACE TO FACE EVALUATION WAS PERFORMED  Ranelle Oyster 03/11/2019, 11:01 AM

## 2019-03-11 NOTE — Plan of Care (Signed)
  Problem: RH BLADDER ELIMINATION Goal: RH STG MANAGE BLADDER WITH ASSISTANCE Description STG Manage Bladder With Assistance Min  Outcome: Not Progressing; I and o cath   Problem: RH SAFETY Goal: RH STG ADHERE TO SAFETY PRECAUTIONS W/ASSISTANCE/DEVICE Description STG Adhere to Safety Precautions With Assistance/Device. Min  Outcome: Not Progressing; telesitter

## 2019-03-12 ENCOUNTER — Inpatient Hospital Stay (HOSPITAL_COMMUNITY): Payer: Medicare Other

## 2019-03-12 ENCOUNTER — Inpatient Hospital Stay (HOSPITAL_COMMUNITY): Payer: Medicare Other | Admitting: Occupational Therapy

## 2019-03-12 ENCOUNTER — Inpatient Hospital Stay (HOSPITAL_COMMUNITY): Payer: Medicare Other | Admitting: Speech Pathology

## 2019-03-12 LAB — CBC WITH DIFFERENTIAL/PLATELET
Abs Immature Granulocytes: 0.11 10*3/uL — ABNORMAL HIGH (ref 0.00–0.07)
Basophils Absolute: 0 10*3/uL (ref 0.0–0.1)
Basophils Relative: 0 %
Eosinophils Absolute: 0.1 10*3/uL (ref 0.0–0.5)
Eosinophils Relative: 1 %
HCT: 27.6 % — ABNORMAL LOW (ref 36.0–46.0)
Hemoglobin: 8.9 g/dL — ABNORMAL LOW (ref 12.0–15.0)
Immature Granulocytes: 1 %
Lymphocytes Relative: 5 %
Lymphs Abs: 0.7 10*3/uL (ref 0.7–4.0)
MCH: 27.1 pg (ref 26.0–34.0)
MCHC: 32.2 g/dL (ref 30.0–36.0)
MCV: 84.1 fL (ref 80.0–100.0)
Monocytes Absolute: 1.3 10*3/uL — ABNORMAL HIGH (ref 0.1–1.0)
Monocytes Relative: 10 %
Neutro Abs: 10.9 10*3/uL — ABNORMAL HIGH (ref 1.7–7.7)
Neutrophils Relative %: 83 %
Platelets: 566 10*3/uL — ABNORMAL HIGH (ref 150–400)
RBC: 3.28 MIL/uL — ABNORMAL LOW (ref 3.87–5.11)
RDW: 13.5 % (ref 11.5–15.5)
WBC: 13.1 10*3/uL — ABNORMAL HIGH (ref 4.0–10.5)
nRBC: 0 % (ref 0.0–0.2)

## 2019-03-12 LAB — BASIC METABOLIC PANEL
Anion gap: 16 — ABNORMAL HIGH (ref 5–15)
BUN: 21 mg/dL (ref 8–23)
CO2: 24 mmol/L (ref 22–32)
Calcium: 8.5 mg/dL — ABNORMAL LOW (ref 8.9–10.3)
Chloride: 92 mmol/L — ABNORMAL LOW (ref 98–111)
Creatinine, Ser: 0.69 mg/dL (ref 0.44–1.00)
GFR calc Af Amer: >60 mL/min (ref >60)
GFR calc non Af Amer: >60 mL/min (ref >60)
Glucose, Bld: 115 mg/dL — ABNORMAL HIGH (ref 70–99)
Potassium: 3.9 mmol/L (ref 3.5–5.1)
Sodium: 132 mmol/L — ABNORMAL LOW (ref 135–145)

## 2019-03-12 LAB — OCCULT BLOOD X 1 CARD TO LAB, STOOL: Fecal Occult Bld: POSITIVE — AB

## 2019-03-12 LAB — IRON AND TIBC
Iron: 12 ug/dL — ABNORMAL LOW (ref 28–170)
Saturation Ratios: 6 % — ABNORMAL LOW (ref 10.4–31.8)
TIBC: 217 ug/dL — ABNORMAL LOW (ref 250–450)
UIBC: 205 ug/dL

## 2019-03-12 LAB — FERRITIN: Ferritin: 357 ng/mL — ABNORMAL HIGH (ref 11–307)

## 2019-03-12 NOTE — Progress Notes (Signed)
Nutrition Follow-up  DOCUMENTATION CODES:   Not applicable  INTERVENTION:  - Please obtain new weight, last weight is from 4/25  - Recommend considering Cortrak NGT and short-term enteral nutrition given prolonged poor PO intake since admission  - Continue Ensure Enlive po TID, each supplement provides 350 kcal and 20 grams of protein  -ContinueMagicCup TID with meals, each supplement provides 290 kcal and 9 grams of protein  - Continue MVI with minerals daily  - Encourage adequate PO intake  NUTRITION DIAGNOSIS:   Increased nutrient needs related to acute illness as evidenced by estimated needs.  Ongoing, being addressed via oral nutrition supplements  GOAL:   Patient will meet greater than or equal to 90% of their needs  Progressing  MONITOR:   PO intake, Supplement acceptance, Labs, Weight trends, Skin  REASON FOR ASSESSMENT:   Malnutrition Screening Tool    ASSESSMENT:   78 y.o. female with history of PVC, hypothyroidism, arthritis, migraines presenting with L sided weakness to [place] on 4/21. MRI small R CR infarct, 40mm R MCA aneurysm. Admitted to [facility] on 4/24.  Discussed pt with RN and NT. NT reports pt had bites of Magic Cup and applesauce for lunch. RN reports pt had applesauce for breakfast with medications. Both RN and NT confirm pt has continued poor PO intake.  Attempted to speak with pt at bedside but pt as asleep and did not awaken to RD voice.  There are no new weights since 4/25.  RD initially recommended Cortrak NGT for short-term enteral nutrition support on 5/06. Pt's average meal completion has improved only slightly from 33% to 39%. At this time, continue to recommend Cortrak NGT and initiation of enteral nutrition. Pt likely meets criteria for acute malnutrition at this time.  Meal Completion: 10-85% x last 8 recorded meals (averaging 39%)  Medications reviewed and include: Ensure Enlive TID, Niferex, MVI with  minerals daily, Miralax, Senna  Labs reviewed: sodium 132 (L), chloride 92 (L), hemoglobin 8.9 (L), iron 12 (L)  Diet Order:   Diet Order            DIET DYS 3 Room service appropriate? Yes; Fluid consistency: Thin  Diet effective now              EDUCATION NEEDS:   Not appropriate for education at this time  Skin:  Skin Assessment: Reviewed RN Assessment  Last BM:  03/11/19 medium type 6  Height:   Ht Readings from Last 1 Encounters:  02/23/19 5\' 5"  (1.651 m)    Weight:   Wt Readings from Last 1 Encounters:  02/24/19 68.5 kg    Ideal Body Weight:  56.8 kg  BMI:  Body mass index is 25.13 kg/m.  Estimated Nutritional Needs:   Kcal:  1700-1900 kcals   Protein:  80-90 g  Fluid:  >/= 1.7 L    Earma Reading, MS, RD, LDN Inpatient Clinical Dietitian Pager: [phone] Weekend/After Hours: [phone]

## 2019-03-12 NOTE — Progress Notes (Signed)
Speech Language Pathology Daily Session Note  Patient Details  Name: Connie Oconnor MRN: [id number] Date of Birth: October 19, 1941  Today's Date: 03/12/2019 SLP Individual Time: 1115-1130 and 1200-1230 SLP Individual Time Calculation (min): 15 min and 30 minutes  Short Term Goals: Week 3: SLP Short Term Goal 1 (Week 3): Patient will demonstrate functional problem solving for basic tasks with Mod A verbal cues.  SLP Short Term Goal 2 (Week 3): Patient will demonstrate sustained attention to functional tasks for 15 minutes with Mod A verbal cues for redirection.  SLP Short Term Goal 3 (Week 3): Patient will recall new, daily information with Mod A multimodal cues.   Skilled Therapeutic Interventions:  Skilled treatment session #1 focused on cognitive ability to maintain alertness. Despite holding cards in her hand, pt unable to keep eyes open. With Max A verbal cues, pt able to answer yes/no questions and then provide 1 sentence about picture card. Session stopped and will re attempt.   Skilled treatment session #2 focused on cognition goals. Pt received in bed and was awake upon SLP entering room. Pt able to maintin alertness for 30 minutes. SLP facilitiated sessio by having pt describe functional actions within pictures. Pt's ability was much improved over previous session.       Therapy/Group: Individual Therapy  Parnell Spieler 03/12/2019, 12:29 PM

## 2019-03-12 NOTE — Progress Notes (Signed)
[facility] PHYSICAL MEDICINE & REHABILITATION PROGRESS NOTE   Subjective/Complaints: Patient seen sitting up in bed this morning eating breakfast.  She states she did not sleep well overnight.  She states she did not have a good weekend.  She is much more alert and interactive and joking with staff.  ROS: Limited due to cognition, but appears to deny CP, shortness of breath, nausea, vomiting, diarrhea.  Objective:   No results found. Recent Labs    03/10/19 0527  WBC 11.7*  HGB 9.0*  HCT 27.5*  PLT 461*   No results for input(s): NA, K, CL, CO2, GLUCOSE, BUN, CREATININE, CALCIUM in the last 72 hours.  Intake/Output Summary (Last 24 hours) at 03/12/2019 0935 Last data filed at 03/12/2019 0819 Gross per 24 hour  Intake 620 ml  Output 108 ml  Net 512 ml     Physical Exam: Vital Signs Blood pressure (!) 144/65, pulse 80, temperature 99.3 F (37.4 C), temperature source Oral, resp. rate 18, height 5\' 5"  (1.651 m), weight 68.5 kg, SpO2 93 %. Constitutional: No distress . Vital signs reviewed. HENT: Normocephalic.  Atraumatic. Eyes: EOMI. No discharge. Cardiovascular: No JVD. Respiratory: Normal effort. GI: Non-distended. Musc: + TTP right thigh Neurologic: Alert and and oriented x to Motor:  Left upper extremity: Shoulder abduction 3/5, distally 4+/5 Left lower extremity: 4 +/5 proximal to distal---stable motor exam Right lower extremity: 2/5 hip flexion, knee extension (pain inhibition, stable) Skin: Warm and dry.  Intact.  Assessment/Plan: 1. Functional deficits secondary to right corona radiata infarct which require 3+ hours per day of interdisciplinary therapy in a comprehensive inpatient rehab setting.  Physiatrist is providing close team supervision and 24 hour management of active medical problems listed below.  Physiatrist and rehab team continue to assess barriers to discharge/monitor patient progress toward functional and medical goals  Care Tool:  Bathing   Bathing activity did not occur: Safety/medical concerns Body parts bathed by patient: Left arm, Abdomen, Chest, Front perineal area, Right upper leg, Left upper leg, Face   Body parts bathed by helper: Right arm, Buttocks, Right lower leg, Left lower leg     Bathing assist Assist Level: Moderate Assistance - Patient 50 - 74%     Upper Body Dressing/Undressing Upper body dressing   What is the patient wearing?: Pull over shirt    Upper body assist Assist Level: Moderate Assistance - Patient 50 - 74%    Lower Body Dressing/Undressing Lower body dressing      What is the patient wearing?: Pants, Incontinence brief     Lower body assist Assist for lower body dressing: Maximal Assistance - Patient 25 - 49%     Toileting Toileting    Toileting assist Assist for toileting: 2 Helpers     Transfers Chair/bed transfer  Transfers assist     Chair/bed transfer assist level: Moderate Assistance - Patient 50 - 74%     Locomotion Ambulation   Ambulation assist   Ambulation activity did not occur: Safety/medical concerns(fatigue/pain )  Assist level: Moderate Assistance - Patient 50 - 74% Assistive device: Walker-rolling(and L hand splint) Max distance: 5'   Walk 10 feet activity   Assist  Walk 10 feet activity did not occur: Safety/medical concerns(fatigue/pain )        Walk 50 feet activity   Assist Walk 50 feet with 2 turns activity did not occur: Safety/medical concerns(fatigue/pain )         Walk 150 feet activity   Assist Walk 150 feet activity  did not occur: Safety/medical concerns(fatigue/pain )         Walk 10 feet on uneven surface  activity   Assist Walk 10 feet on uneven surfaces activity did not occur: Safety/medical concerns(fatigue/pain )         Wheelchair     Assist Will patient use wheelchair at discharge?: Yes Type of Wheelchair: Manual    Wheelchair assist level: Supervision/Verbal cueing Max wheelchair distance:  15'    Wheelchair 50 feet with 2 turns activity    Assist    Wheelchair 50 feet with 2 turns activity did not occur: Safety/medical concerns(fatigue/pain )       Wheelchair 150 feet activity     Assist Wheelchair 150 feet activity did not occur: Safety/medical concerns(fatigue/pain )        Medical Problem List and Plan: 1.Left side weaknesssecondary to 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  Cont CIR Plan outpatient coiling of aneurysm per interventional radiology  Will consider anti-spasticity medications if necessary, not appropriate at this point  Weekend notes reviewed-stable 2. Antithrombotics: -DVT/anticoagulation:SCDs.  Lower extremity Dopplers negative DVT -antiplatelet therapy: aspirin 81 mg daily Plavix 75 mg daily 3 weeks and Plavix alone 3. Pain Management/migraine headaches:Tramadolasneeded Scheduled Flexeril (home med) for right hand cramps/back Added kpad for low back  Muscle rub ordered for left calf and right thigh- improved 4. Mood:Provide emotional support -antipsychotic agents: N/A 5. Neuropsych: This patientis capable of making decisions on herown behalf.  Continue tele-sitter for safety  Needs significant encouragement 6. Skin/Wound Care:Routine skin checks 7. Fluids/Electrolytes/Nutrition:Routine in and outs  D3 thins, advance diet as tolerated 8. History of traumatic [facility] May 2018. Patient seen in the past by Dr. Conchita Paris 9. Hypertension.  Vitals:   03/11/19 2107 03/12/19 0622  BP: (!) 158/69 (!) 144/65  Pulse: 87 80  Resp: 20 18  Temp: 98.1 F (36.7 C) 99.3 F (37.4 C)  SpO2: 95% 93%   Norvasc 10 mg daily, decreased to 5 mg on 4/26  Labile on 5/11 10. Hyperlipidemia. Lipitor 11. Tobacco abuse. Counseling 12.   Hyperglycemia  Hemoglobin A1c 5.5 on 01/2019  P.o. intake remains inconsistent on 5/11  Patient needs significant time and cueing to encourage intake. 13.  Acute blood loss anemia  Hemoccult ordered and positive, repeat negative  Hgb 9.0 on 5/9, CBC pending for this a.m.  Added Fe++ supp 14.  Hypoalbuminemia  Supplement initiated on 4/27 15.  Slow transit constipation  Bowel regimen increased on 5/3, increased again on 5/4  Improving 16. Low grade fevers: Improved  WBCs 11.7 on 5/9, labs pending  Urine culture with MRSA  Continue Macrobid started on 5/6-5/13     LOS: 17 days A FACE TO FACE EVALUATION WAS PERFORMED  Dillan Lunden Karis Juba 03/12/2019, 9:35 AM

## 2019-03-12 NOTE — Progress Notes (Signed)
Physical Therapy Session Note  Patient Details  Name: Connie Oconnor MRN: [id number] Date of Birth: Mar 26, 1941  Today's Date: 03/12/2019 PT Individual Time: 1330-1445 PT Individual Time Calculation (min): 75 min   Short Term Goals:  Week 2:  PT Short Term Goal 1 (Week 2): pt will perform stand pivot transfers with mod assist consistently PT Short Term Goal 2 (Week 2): pt will initiate gait training PT Short Term Goal 3 (Week 2): pt will balance in sitting wiht feet supported, reaching within BOS , wiht min assist     Skilled Therapeutic Interventions/Progress Updates:   Pt asleep in bed.  Easily awakened and denied pain.  Supine> sit with significant extra time, min assist, max cues for technique.  Pt c/o R shoulder tenderness to touch and R groin pain with movement.  PT notified RN.   Pt sat EOB x 5 minutes, mod assist for balance due to L lean.  Pt unable to reach and lean R to reach midline due to R groin pain.  +2 Stedy for bed> w/c transfer.    Sitting up in tilt in space w/c, pt fell asleep.  She would occasionally open her eyes to her name, but then fall back to sleep.   Tilted back, BP 138/56, HR 68.  Use of Stedy to prepare for standing activity; pt reported she needed to have BM.  Stedy to return to bed and get onto bedpan, +2 assist.  Pt left in care of [place], LPN.      Therapy Documentation Precautions:  Precautions Precautions: Fall Precaution Comments: L hemi, back pain, needs time to initiate movements  Restrictions Weight Bearing Restrictions: No  Pain: Pain Assessment Pain Scale: 0-10 Pain Score: 0-No pain at rest; grimaces with R LE movement and tender to touch R anterior shoulder.   Individual tx  Fisher Hargadon 03/12/2019, 4:14 PM

## 2019-03-12 NOTE — Progress Notes (Signed)
Occupational Therapy Session Note  Patient Details  Name: Connie Oconnor MRN: [id number] Date of Birth: 1941-01-20  Today's Date: 03/12/2019 OT Individual Time: 4098-1191 OT Individual Time Calculation (min): 55 min    Short Term Goals: Week 3:  OT Short Term Goal 1 (Week 3): STG=LTG 2/2 ELOS  Skilled Therapeutic Interventions/Progress Updates:    Pt resting in TIS upon arrival.  Pt bathed and dressed from earlier OT session.  Focus on LUE functional use (grasp/release and elbow/shoulder flexion and shoulder. Pt requested pieces of candy (mini Reeses and mini Kit Kat). Pt required assistance with support at elbow but pt was able to weakly grasp candy and partially bring to mouth.  Pt required more than a reasonable amount of time to bring 4 pieces of candy to her mouth with min A. Pt requested to return to bed and performed squat pivot transfer with mod A.  Pt assisted with repositioning by pushing with BLE to push up in bed.  Pt remained in bed with bed alarm activated and all needs within reach.   Therapy Documentation Precautions:  Precautions Precautions: Fall Precaution Comments: L hemi, back pain, needs time to initiate movements  Restrictions Weight Bearing Restrictions: No  Pain:  Pt c/o increased RLE (groin) discomfort with movement; repositioned   Therapy/Group: Individual Therapy  Leroy Libman 03/12/2019, 10:29 AM

## 2019-03-12 NOTE — Plan of Care (Signed)
  Problem: RH BOWEL ELIMINATION Goal: RH STG MANAGE BOWEL W/MEDICATION W/ASSISTANCE Description STG Manage Bowel with Medication with Assistance. min  Outcome: Progressing   Problem: RH BLADDER ELIMINATION Goal: RH STG MANAGE BLADDER WITH ASSISTANCE Description STG Manage Bladder With Assistance Min  Outcome: Progressing

## 2019-03-12 NOTE — Progress Notes (Signed)
Occupational Therapy Session Note  Patient Details  Name: Connie Oconnor MRN: [id number] Date of Birth: 10/26/1941  Today's Date: 03/12/2019 OT Individual Time: 9021-1155 OT Individual Time Calculation (min): 50 min    Short Term Goals: Week 2:  OT Short Term Goal 1 (Week 2): Pt will consistently transfer to toilet/BSC wiht MIN A OT Short Term Goal 1 - Progress (Week 2): Progressing toward goal OT Short Term Goal 2 (Week 2): pt will thread BUE into shirt wiht S OT Short Term Goal 2 - Progress (Week 2): Progressing toward goal OT Short Term Goal 3 (Week 2): Pt will recall hemi techniques iwht min VC OT Short Term Goal 3 - Progress (Week 2): Progressing toward goal OT Short Term Goal 4 (Week 2): Pt will thread 1LE into pants wiht VC OT Short Term Goal 4 - Progress (Week 2): Met  Skilled Therapeutic Interventions/Progress Updates:    Patient in bed upon arrival finishing breakfast - using left hand to hold objects with min difficulty during spontaneous tasks.  Supine to SSP with max A and increased time.  Patient continues with lean to left and flexed posture at times t/o session.  SPT bed to w/c with mod A.  Patient requested to use commode - SPT with max A and max encouragement.  Small BM completed.  Patient is dependent for clothing management and hygiene - able to stand with walker during this activity with mod A.  SPT back to w/c with max A.  LB dressing is dependent at w/c level.  Patient completes oral care seated at sink with mod/max a and hand over hand for denture care.  Patient remained in [place] w/c in reclined position at close of session with seatbelt alarm set, tele-sitter and call bell in reach.    Therapy Documentation Precautions:  Precautions Precautions: Fall Precaution Comments: L hemi, back pain, needs time to initiate movements  Restrictions Weight Bearing Restrictions: No General:   Vital Signs: Therapy Vitals Temp: 98.4 F (36.9 C) Temp Source: Oral Pulse Rate:  75 Resp: 18 BP: (!) 140/53 Patient Position (if appropriate): Lying Oxygen Therapy SpO2: 98 % O2 Device: Room Air Pain: Pain Assessment Pain Scale: 0-10 Pain Score: 0-No pain   Other Treatments:     Therapy/Group: Individual Therapy  Carlos Levering 03/12/2019, 3:31 PM

## 2019-03-13 ENCOUNTER — Inpatient Hospital Stay (HOSPITAL_COMMUNITY): Payer: Medicare Other

## 2019-03-13 ENCOUNTER — Inpatient Hospital Stay (HOSPITAL_COMMUNITY): Payer: Medicare Other | Admitting: Physical Therapy

## 2019-03-13 ENCOUNTER — Inpatient Hospital Stay (HOSPITAL_COMMUNITY): Payer: Medicare Other | Admitting: Speech Pathology

## 2019-03-13 LAB — URINALYSIS, COMPLETE (UACMP) WITH MICROSCOPIC
Bilirubin Urine: NEGATIVE
Glucose, UA: NEGATIVE mg/dL
Hgb urine dipstick: NEGATIVE
Ketones, ur: NEGATIVE mg/dL
Leukocytes,Ua: NEGATIVE
Nitrite: NEGATIVE
Protein, ur: NEGATIVE mg/dL
Specific Gravity, Urine: 1.014 (ref 1.005–1.030)
pH: 7 (ref 5.0–8.0)

## 2019-03-13 LAB — SARS CORONAVIRUS 2 BY RT PCR (HOSPITAL ORDER, PERFORMED IN ~~LOC~~ HOSPITAL LAB): SARS Coronavirus 2: NEGATIVE

## 2019-03-13 IMAGING — CR CHEST - 2 VIEW
2 series · 2 of 2 positions shown · non-contrast
Comparison: None.

CLINICAL DATA: Leukocytosis

EXAM:
CHEST - 2 VIEW

[chest lat]
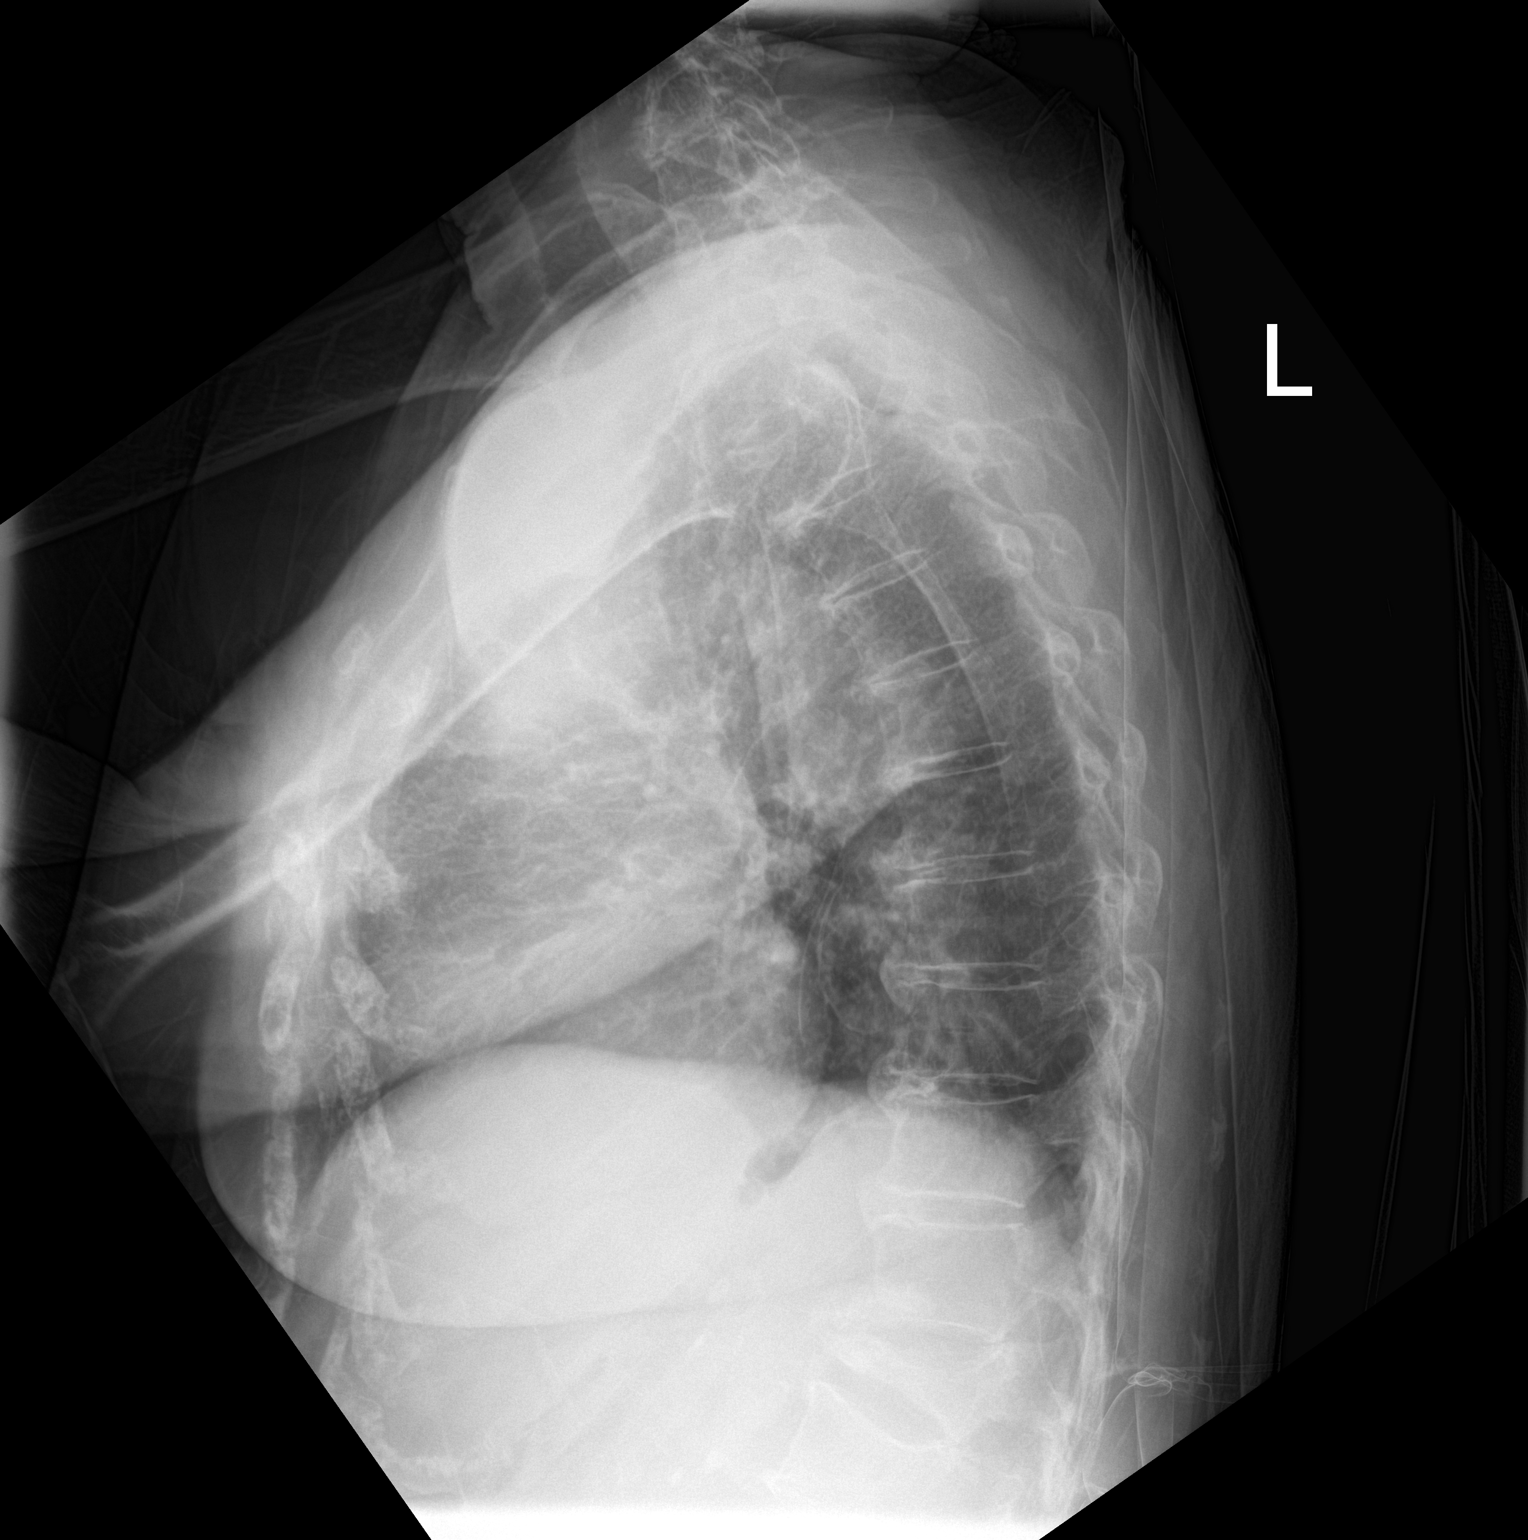

[chest ap]
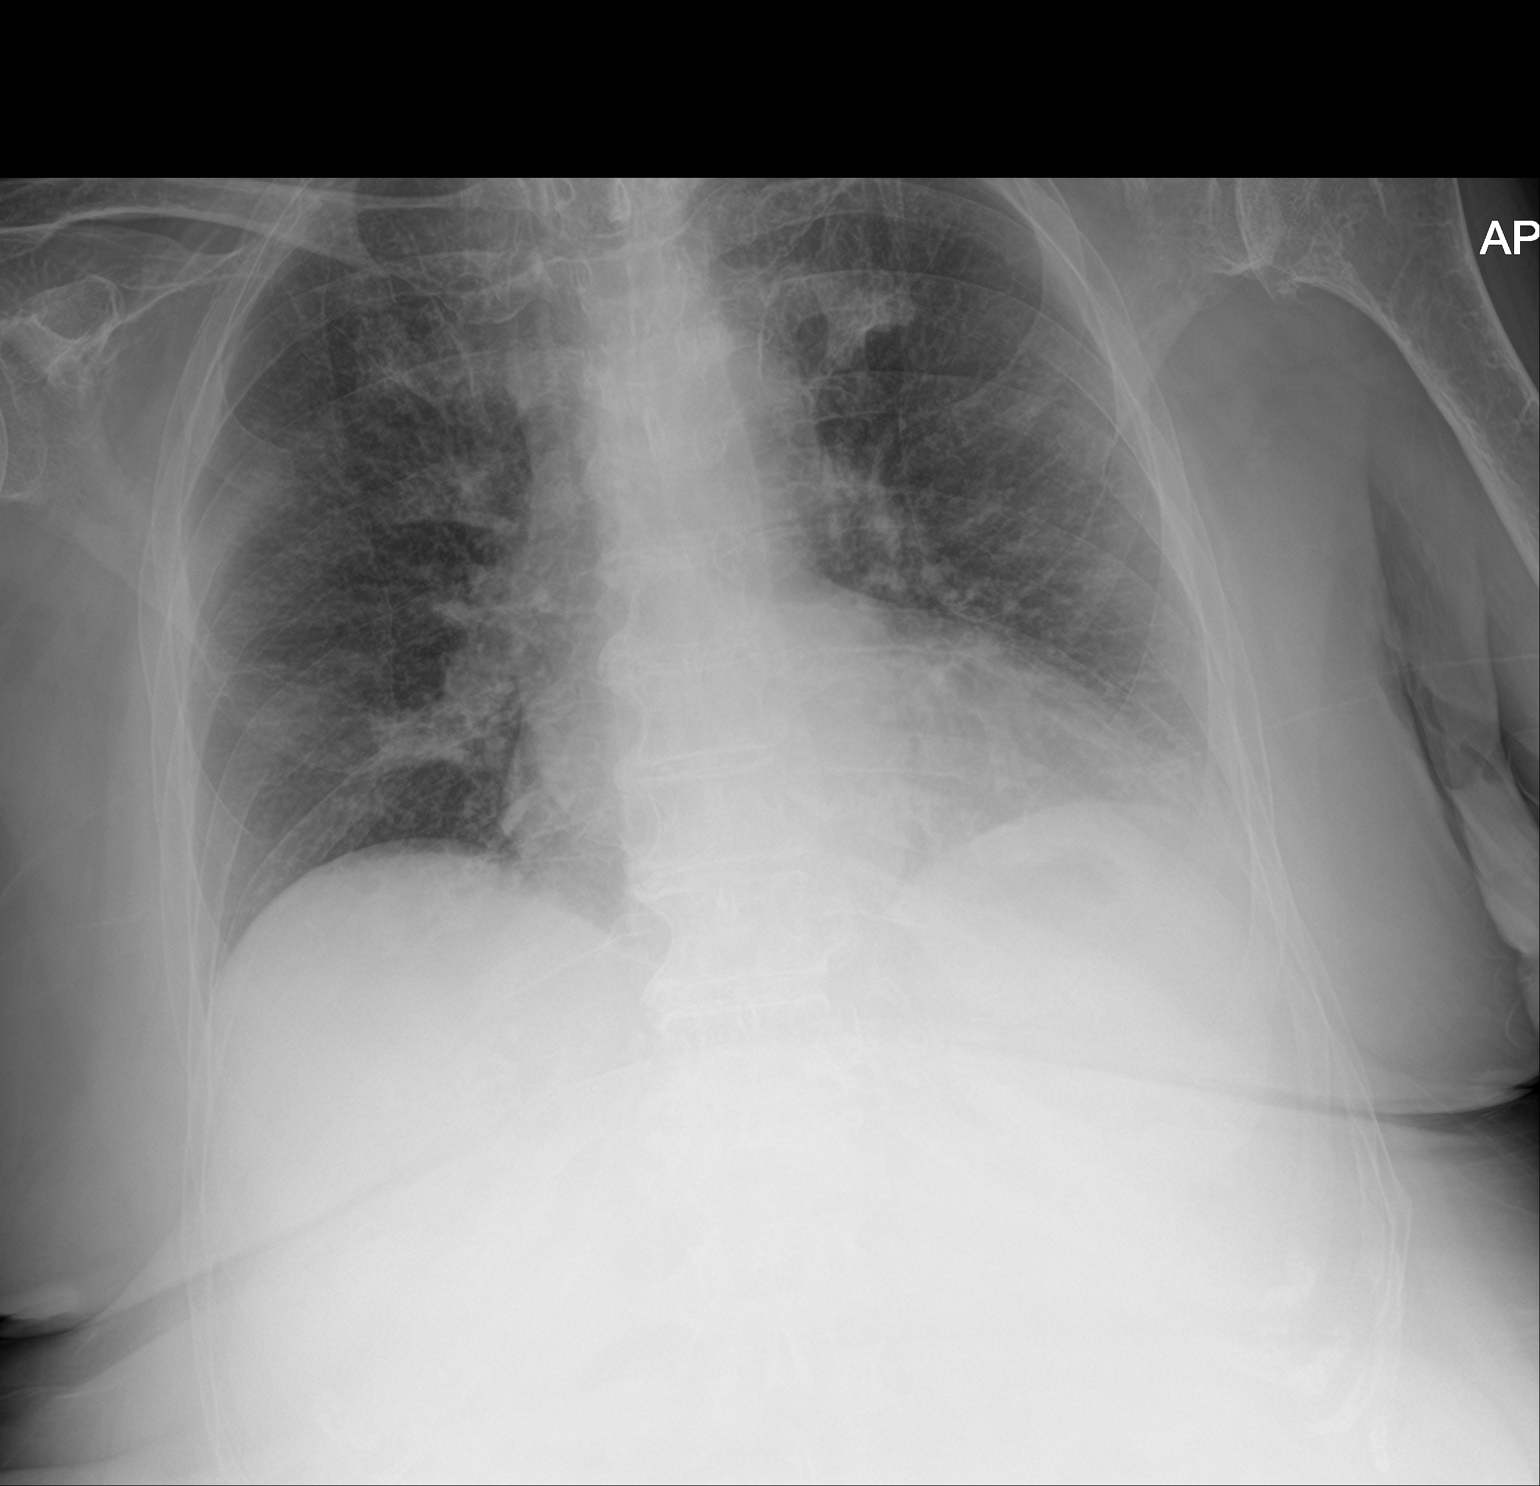

[2 of 2 positions shown; findings below may reference images not displayed]

FINDINGS: The heart size and mediastinal contours are within normal limits.
There is mild, diffuse bilateral interstitial pulmonary opacity.
Disc degenerative disease and osteophytosis of the thoracic spine.
There is a high-grade wedge deformity of a lower thoracic or upper
lumbar vertebral body.
IMPRESSION: There is mild, diffuse bilateral interstitial pulmonary opacity,
which may reflect edema or atypical/viral infection. No evident
focal airspace opacity.

## 2019-03-13 NOTE — Progress Notes (Signed)
Physical Therapy Session Note  Patient Details  Name: Lisset Packard MRN: [id number] Date of Birth: [birth year]  Today's Date: 03/13/2019 PT Missed Time: 45 Minutes Missed Time Reason: Xray  Skilled Therapeutic Interventions/Progress Updates:   Upon therapist arrival to room pt lying supine in bed with nursing staff and transport services present to take pt to X-ray. Missed 45 minutes.   Ginny Forth, PT, DPT 03/13/2019, 7:49 AM

## 2019-03-13 NOTE — Progress Notes (Signed)
Speech Language Pathology Daily Session Note  Patient Details  Name: Connie Oconnor MRN: [id number] Date of Birth: [birth year]  Today's Date: 03/13/2019 SLP Individual Time: 1100-1130 SLP Individual Time Calculation (min): 30 min  Short Term Goals: Week 3: SLP Short Term Goal 1 (Week 3): Patient will demonstrate functional problem solving for basic tasks with Mod A verbal cues.  SLP Short Term Goal 2 (Week 3): Patient will demonstrate sustained attention to functional tasks for 15 minutes with Mod A verbal cues for redirection.  SLP Short Term Goal 3 (Week 3): Patient will recall new, daily information with Mod A multimodal cues.   Skilled Therapeutic Interventions: Pt was seen for skilled ST intervention targeting goals for improved cognitive functioning. Pt required mod+ verbal and tactile stim (cold wet cloth) to maintain alertness and participation in treatment. Pt required max cues and repeated instruction during simple sorting task using Blink deck. By end of session, pt required mod cues to verbalize what was being matched. Pt was left in bed with alarm on, all needs within reach. Continue per current plan of care.   Pain Pain Assessment Pain Scale: 0-10 Pain Score: 0-No pain  Therapy/Group: Individual Therapy   Celia B. Murvin Natal, [facility], CCC-SLP Speech Language Pathologist  Leigh Aurora 03/13/2019, 12:42 PM

## 2019-03-13 NOTE — Progress Notes (Signed)
Physical Therapy Note  Patient Details  Name: Connie Oconnor MRN: [id number] Date of Birth: 11-04-1940 Today's Date: 03/13/2019    Attempted to make up missed therapy time this afternoon. Patient reported she was having 10/10 R hip and back pain and declined PT this afternoon due to pain. PT assisted patient with calling RN for pain medicine and repositioned patient in bed for comfort.   Helayne Semin[place], PT, DPT  03/13/2019, 3:05 PM

## 2019-03-13 NOTE — Progress Notes (Signed)
Physical Therapy Weekly Progress Note  Patient Details  Name: Connie Oconnor MRN: [id number] Date of Birth: [birth year]  Beginning of progress report period: February 24, 2019 End of progress report period: Mar 13, 2019  Today's Date: 03/13/2019 PT Individual Time: 0800-0900 PT Individual Time Calculation (min): 60 min   Patient has met 1 of 3 short term goals.  Patient is progressing toward consistently transferring with mod A, she is limited by fatigue and R hip pain needing up to +2 with the [facility] for transfers late in the afternoon at times. She has initiated some stepping during transfers with the RW with a hand splint, however is also limited by R hip pain, fatigue, and significantly forward flexed trunk in standing. Patient did go down for an x-ray of her R hip today.   Patient continues to demonstrate the following deficits muscle weakness and muscle joint tightness, decreased cardiorespiratoy endurance, impaired timing and sequencing, abnormal tone and decreased coordination, decreased initiation and decreased problem solving and decreased sitting balance, decreased standing balance, decreased postural control, hemiplegia and decreased balance strategies and therefore will continue to benefit from skilled PT intervention to increase functional independence with mobility.  Patient progressing toward long term goals..  Continue plan of care.  PT Short Term Goals Week 2:  PT Short Term Goal 1 (Week 2): pt will perform stand pivot transfers with mod assist consistently PT Short Term Goal 1 - Progress (Week 2): Progressing toward goal PT Short Term Goal 2 (Week 2): pt will initiate gait training PT Short Term Goal 2 - Progress (Week 2): Progressing toward goal PT Short Term Goal 3 (Week 2): pt will balance in sitting wiht feet supported, reaching within BOS , wiht min assist PT Short Term Goal 3 - Progress (Week 2): Met Week 3:  PT Short Term Goal 1 (Week 3): pt will balance in sitting wiht  feet supported, reaching within BOS , with supervision PT Short Term Goal 2 (Week 3): pt will initiate gait training PT Short Term Goal 3 (Week 3): pt will perform stand pivot transfers with mod-min assist consistently PT Short Term Goal 4 (Week 3): Patient will propel a hemi-hieght w/c 25' with use of up to 4 extremities.   Skilled Therapeutic Interventions/Progress Updates:     Patient in bed eating with assistance from NT upon PT arrival. Patient alert and agreeable to PT session. Patient able to eat 1/2 of a banana with supervision form PT at beginning of session before stating that she needed to use the bathroom.  Therapeutic Activity: Bed Mobility: Patient performed supine to sit with min A with use of hospital bed features. Provided verbal cues for rolling to the L before sitting up. Transfers: Patient performed a stand pivot transfer to the [facility] using a RW with hand splint with mod A for physical assistance. Provided verbal cues for sequencing, erect posture, and stepping during transfer. She performed sit to/from stand x3 with mod A using a RW from the [facility], with total A for peri-care and LB dressing, reuqired sitting rest breaks in between due to fatigue and R hip pain. She performed a squat pivot transfer from the [facility] to the TIS w/c with mod A of 2 due to fatigue.  Patient in [place] w/c at end of session with breaks locked, seat belt alarm set, and all needs within reach.   Therapy Documentation Precautions:  Precautions Precautions: Fall Precaution Comments: L hemi, back pain, needs time to initiate movements  Restrictions Weight Bearing Restrictions:  No Pain: Pain Assessment Pain Scale: Faces Faces Pain Scale: Hurts even more Pain Type: Acute pain Pain Location: Hip Pain Orientation: Right Pain Descriptors / Indicators: Grimacing;Guarding Pain Frequency: Intermittent Pain Onset: With Activity Pain Intervention(s): Repositioned;Distraction   Therapy/Group: Individual  Therapy  Lesa Vandall L Isak Sotomayor PT, DPT  03/13/2019, 12:08 PM

## 2019-03-13 NOTE — Progress Notes (Signed)
Social Work Patient ID: Connie Oconnor, female   DOB: 12-25-1940, 78 y.o.   MRN: [id number] Spoke with daughter who is concerned regarding mom's mental status and her complaints of hip pain. She feels an x-ray needs to be done and labs for her confusion and back slide. Will let MD know and ask about an x-ray.

## 2019-03-13 NOTE — Progress Notes (Signed)
Occupational Therapy Session Note  Patient Details  Name: Connie Oconnor MRN: [id number] Date of Birth: 09-Feb-1941  Today's Date: 03/13/2019 OT Individual Time: 1230-1300 OT Individual Time Calculation (min): 30 min  and Today's Date: 03/13/2019 OT Missed Time: 30 Minutes Missed Time Reason: Patient fatigue   Short Term Goals: Week 3:  OT Short Term Goal 1 (Week 3): STG=LTG 2/2 ELOS  Skilled Therapeutic Interventions/Progress Updates:    OT intervention with focus on self feeding, functional use of LUE, attention to task, and activity tolerance to increase independence with BADLs. Pt in bed eating lunch upon arrival with NT present.  OTA relieved NT.  Pt initiates use of LUE to hold ice cream cup but requires HOH assistance to grasp sufficiently to use RUE to scoop ice cream.  Pt required min verbal cues to redirect to task at hand.  Pt requires more than a reasonable amount of time to complete tasks. Pt started falling asleep while feeding and session terminated for safety. Pt unable to keep eyes open. Pt remained in bed with all needs within reach and bed alarm activated.   Therapy Documentation Precautions:  Precautions Precautions: Fall Precaution Comments: L hemi, back pain, needs time to initiate movements  Restrictions Weight Bearing Restrictions: No General: General OT Amount of Missed Time: 30 Minutes :  Pain: Pain Assessment Pain Scale: 0-10 Pain Score: 0-No pain  Therapy/Group: Individual Therapy  Rich Brave 03/13/2019, 1:25 PM

## 2019-03-13 NOTE — Progress Notes (Signed)
[facility] PHYSICAL MEDICINE & REHABILITATION PROGRESS NOTE   Subjective/Complaints: Patient seen sitting up in bed this morning eating breakfas consult t supervision.  She states she slept well overnight.  ROS: Limited due to cognition, but appears to deny CP, shortness of breath, nausea, vomiting, diarrhea.  Objective:   No results found. Recent Labs    03/12/19 1048  WBC 13.1*  HGB 8.9*  HCT 27.6*  PLT 566*   Recent Labs    03/12/19 1048  NA 132*  K 3.9  CL 92*  CO2 24  GLUCOSE 115*  BUN 21  CREATININE 0.69  CALCIUM 8.5*    Intake/Output Summary (Last 24 hours) at 03/13/2019 0903 Last data filed at 03/13/2019 0745 Gross per 24 hour  Intake 900 ml  Output 750 ml  Net 150 ml     Physical Exam: Vital Signs Blood pressure (!) 147/74, pulse 73, temperature 99.1 F (37.3 C), temperature source Oral, resp. rate 19, height 5\' 5"  (1.651 m), weight 68.5 kg, SpO2 96 %. Constitutional: No distress . Vital signs reviewed. HENT: Normocephalic.  Atraumatic. Eyes: EOMI.  No discharge. Cardiovascular: No JVD. Respiratory: Normal effort. GI: Non-distended. Musc: + TTP right thigh Neurologic: Alert and and oriented x1 Motor:  Left upper extremity: Shoulder abduction 3/5, distally 4+/5, unchanged Left lower extremity: 4+/5 proximal to distal, unchanged Right lower extremity: 4/5 hip flexion, knee extension, ankle dorsiflexion Skin: Warm and dry.  Intact.  Assessment/Plan: 1. Functional deficits secondary to right corona radiata infarct which require 3+ hours per day of interdisciplinary therapy in a comprehensive inpatient rehab setting.  Physiatrist is providing close team supervision and 24 hour management of active medical problems listed below.  Physiatrist and rehab team continue to assess barriers to discharge/monitor patient progress toward functional and medical goals  Care Tool:  Bathing  Bathing activity did not occur: Safety/medical concerns Body parts  bathed by patient: Left arm, Abdomen, Chest, Front perineal area, Right upper leg, Left upper leg, Face   Body parts bathed by helper: Right arm, Buttocks, Right lower leg, Left lower leg     Bathing assist Assist Level: Moderate Assistance - Patient 50 - 74%     Upper Body Dressing/Undressing Upper body dressing   What is the patient wearing?: Pull over shirt    Upper body assist Assist Level: Moderate Assistance - Patient 50 - 74%    Lower Body Dressing/Undressing Lower body dressing      What is the patient wearing?: Pants, Incontinence brief     Lower body assist Assist for lower body dressing: Maximal Assistance - Patient 25 - 49%     Toileting Toileting    Toileting assist Assist for toileting: Dependent - Patient 0%     Transfers Chair/bed transfer  Transfers assist     Chair/bed transfer assist level: Moderate Assistance - Patient 50 - 74%     Locomotion Ambulation   Ambulation assist   Ambulation activity did not occur: Safety/medical concerns(fatigue/pain )  Assist level: Moderate Assistance - Patient 50 - 74% Assistive device: Walker-rolling(and L hand splint) Max distance: 5'   Walk 10 feet activity   Assist  Walk 10 feet activity did not occur: Safety/medical concerns(fatigue/pain )        Walk 50 feet activity   Assist Walk 50 feet with 2 turns activity did not occur: Safety/medical concerns(fatigue/pain )         Walk 150 feet activity   Assist Walk 150 feet activity did not occur: Safety/medical concerns(fatigue/pain )  Walk 10 feet on uneven surface  activity   Assist Walk 10 feet on uneven surfaces activity did not occur: Safety/medical concerns(fatigue/pain )         Wheelchair     Assist Will patient use wheelchair at discharge?: Yes Type of Wheelchair: Manual    Wheelchair assist level: Supervision/Verbal cueing Max wheelchair distance: 4115'    Wheelchair 50 feet with 2 turns  activity    Assist    Wheelchair 50 feet with 2 turns activity did not occur: Safety/medical concerns(fatigue/pain )       Wheelchair 150 feet activity     Assist Wheelchair 150 feet activity did not occur: Safety/medical concerns(fatigue/pain )        Medical Problem List and Plan: 1.Left side weaknesssecondary to 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  Cont CIR Plan outpatient coiling of aneurysm per interventional radiology  Will consider anti-spasticity medications if necessary, not appropriate at this point 2. Antithrombotics: -DVT/anticoagulation:SCDs.  Lower extremity Dopplers negative DVT -antiplatelet therapy: aspirin 81 mg daily Plavix 75 mg daily 3 weeks and Plavix alone 3. Pain Management/migraine headaches:Tramadolasneeded Scheduled Flexeril (home med) for right hand cramps/back Added kpad for low back  Muscle rub ordered for left calf and right thigh- improved 4. Mood:Provide emotional support -antipsychotic agents: N/A 5. Neuropsych: This patientis not capable of making decisions on herown behalf.  Continue tele-sitter for safety  Needs significant encouragement 6. Skin/Wound Care:Routine skin checks 7. Fluids/Electrolytes/Nutrition:Routine in and outs  D3 thins, advance diet as tolerated 8. [facility] Patient seen in the past by Dr. Conchita ParisNundkumar 9. Hypertension.  Vitals:   03/12/19 1950 03/13/19 0258  BP: (!) 138/57 (!) 147/74  Pulse: 75 73  Resp: (!) 22 19  Temp: 99.1 F (37.3 C) 99.1 F (37.3 C)  SpO2: 96% 96%   Norvasc 10 mg daily, decreased to 5 mg on 4/26  Relatively controlled on 5/12 10. Hyperlipidemia. Lipitor 11. Tobacco abuse. Counseling 12.  Hyperglycemia  Hemoglobin A1c 5.5 on 01/2019  P.o. intake remains  inconsistent on 5/12  Patient needs significant time and cueing to encourage intake. 13.  Acute blood loss anemia  Hemoccult ordered and positive, repeat negative, positive again on 5/11, CBC ordered for tomorrow  Hgb 8.9 on 5/11  Added Fe++ supp 14.  Hypoalbuminemia  Supplement initiated on 4/27 15.  Slow transit constipation  Bowel regimen increased on 5/3, increased again on 5/4  Improving 16. Low grade fevers: Improved  WBCs 13.1 on 5/11, trending up, labs ordered for tomorrow  Urine culture with MRSA  Continue Macrobid started on 5/6-5/13  Repeat UA/urine culture ordered  Chest x-ray ordered on 5/12  Alertness has improved, but remains confused,?  Delirium versus baseline dementia     LOS: 18 days A FACE TO FACE EVALUATION WAS PERFORMED  Connie Oconnor 03/13/2019, 9:03 AM

## 2019-03-14 ENCOUNTER — Inpatient Hospital Stay (HOSPITAL_COMMUNITY): Payer: Medicare Other

## 2019-03-14 LAB — CBC WITH DIFFERENTIAL/PLATELET
Abs Immature Granulocytes: 0.07 10*3/uL (ref 0.00–0.07)
Basophils Absolute: 0 10*3/uL (ref 0.0–0.1)
Basophils Relative: 0 %
Eosinophils Absolute: 0.2 10*3/uL (ref 0.0–0.5)
Eosinophils Relative: 1 %
HCT: 27.2 % — ABNORMAL LOW (ref 36.0–46.0)
Hemoglobin: 9 g/dL — ABNORMAL LOW (ref 12.0–15.0)
Immature Granulocytes: 1 %
Lymphocytes Relative: 6 %
Lymphs Abs: 0.7 10*3/uL (ref 0.7–4.0)
MCH: 27.4 pg (ref 26.0–34.0)
MCHC: 33.1 g/dL (ref 30.0–36.0)
MCV: 82.7 fL (ref 80.0–100.0)
Monocytes Absolute: 1.1 10*3/uL — ABNORMAL HIGH (ref 0.1–1.0)
Monocytes Relative: 10 %
Neutro Abs: 9 10*3/uL — ABNORMAL HIGH (ref 1.7–7.7)
Neutrophils Relative %: 82 %
Platelets: 546 10*3/uL — ABNORMAL HIGH (ref 150–400)
RBC: 3.29 MIL/uL — ABNORMAL LOW (ref 3.87–5.11)
RDW: 13.6 % (ref 11.5–15.5)
WBC: 11 10*3/uL — ABNORMAL HIGH (ref 4.0–10.5)
nRBC: 0 % (ref 0.0–0.2)

## 2019-03-14 LAB — URINE CULTURE: Culture: NO GROWTH

## 2019-03-14 IMAGING — DX DG HIP (WITH OR WITHOUT PELVIS) 2-3V LEFT
3 series · 3 of 3 positions shown · non-contrast
Comparison: Chest

CLINICAL DATA: Left hip pain for 1 month

EXAM:
DG HIP (WITH OR WITHOUT PELVIS) 2-3V LEFT

[pelvis ap]
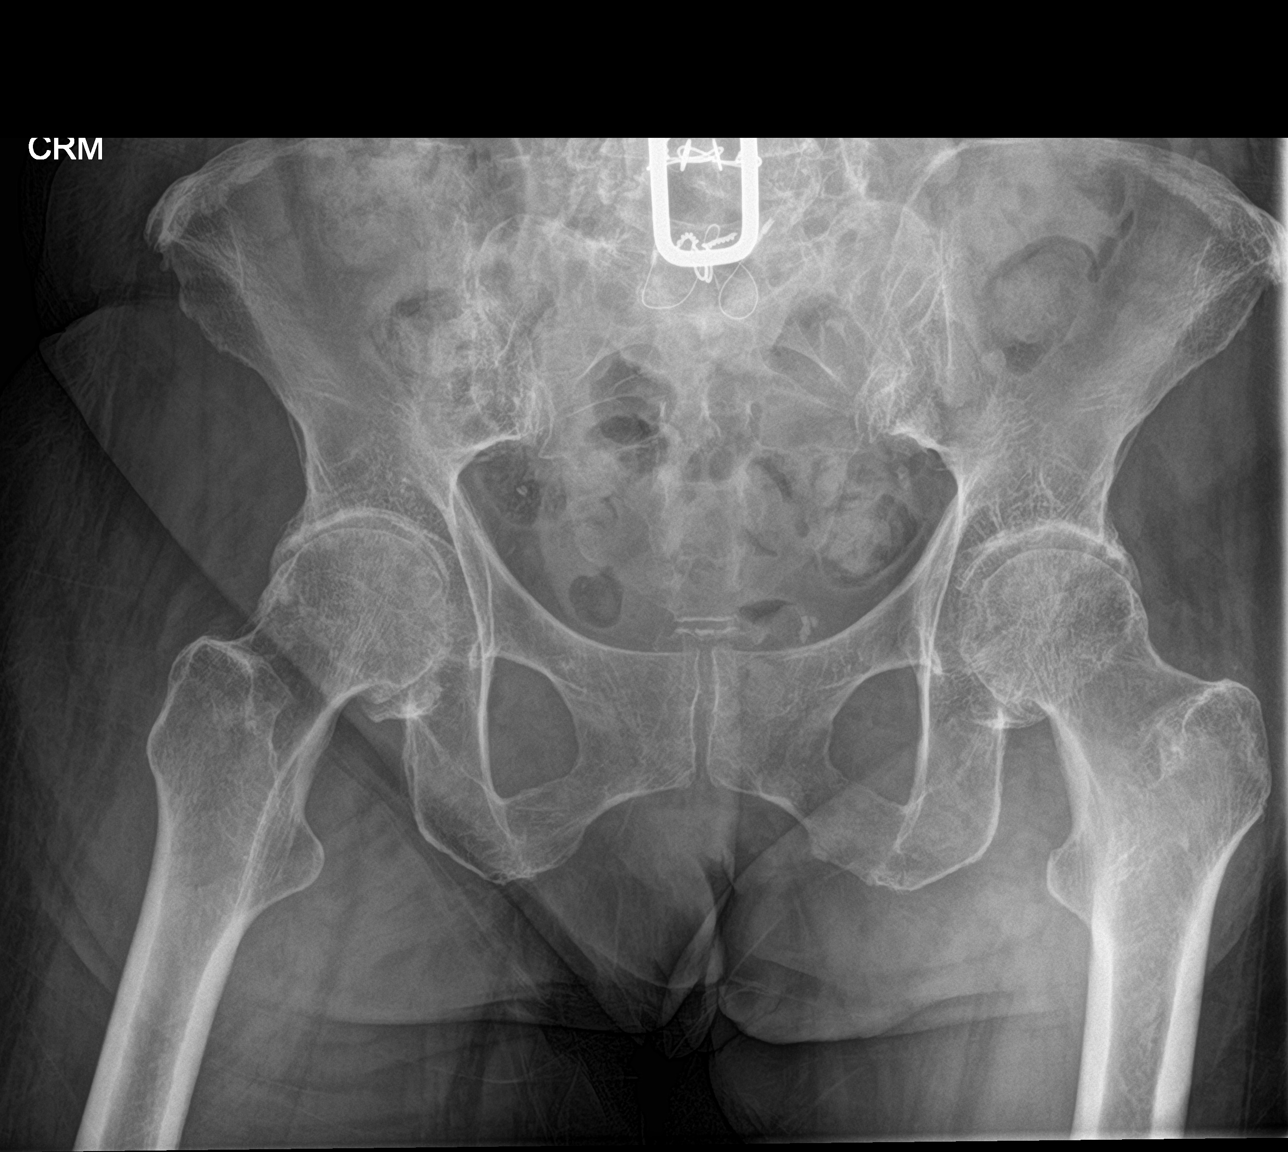

[hip ap]
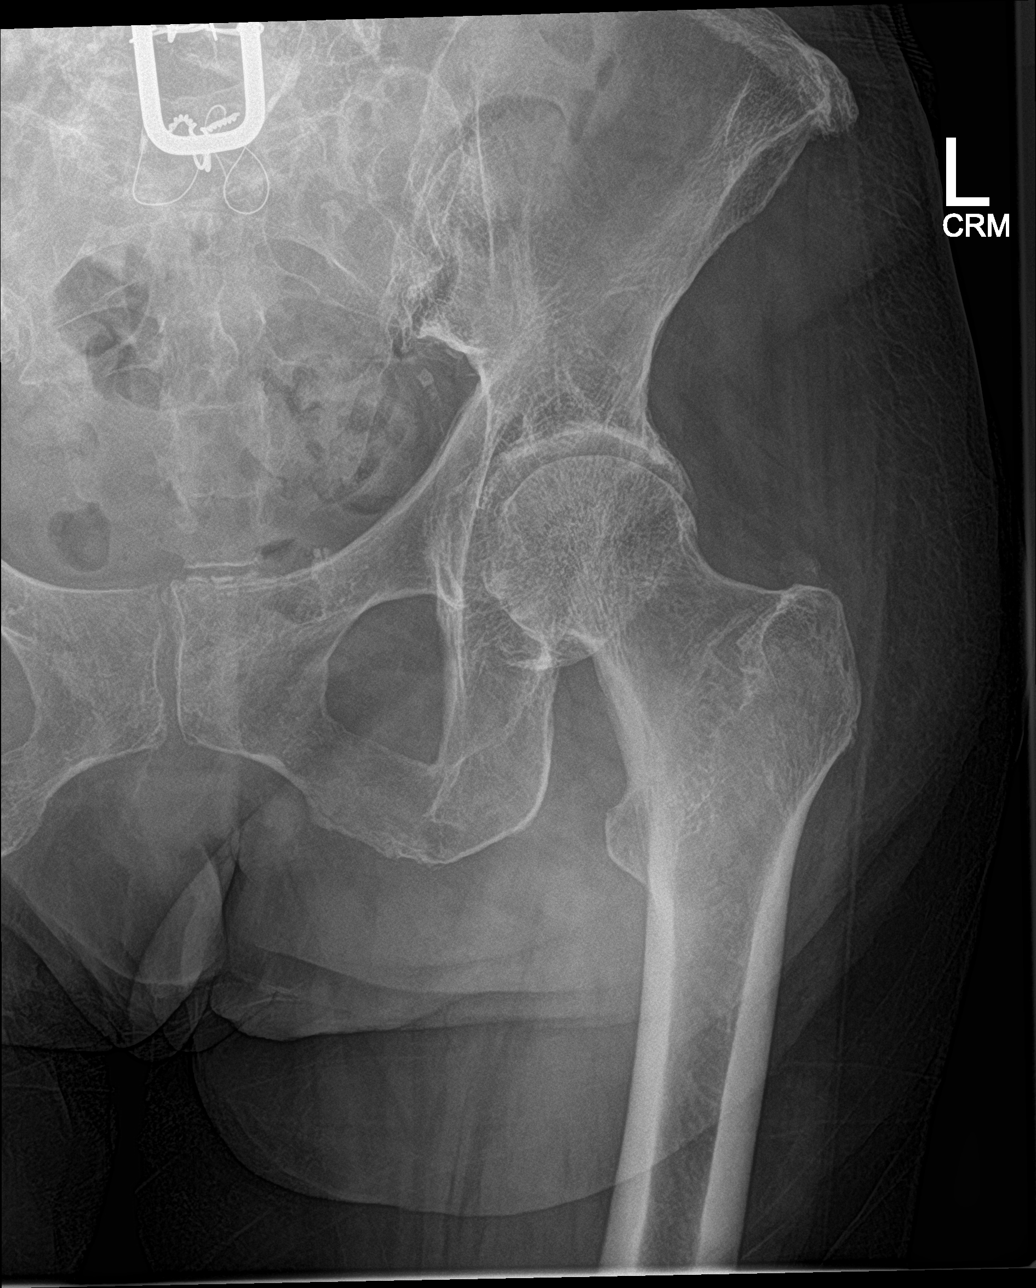

[hip lat]
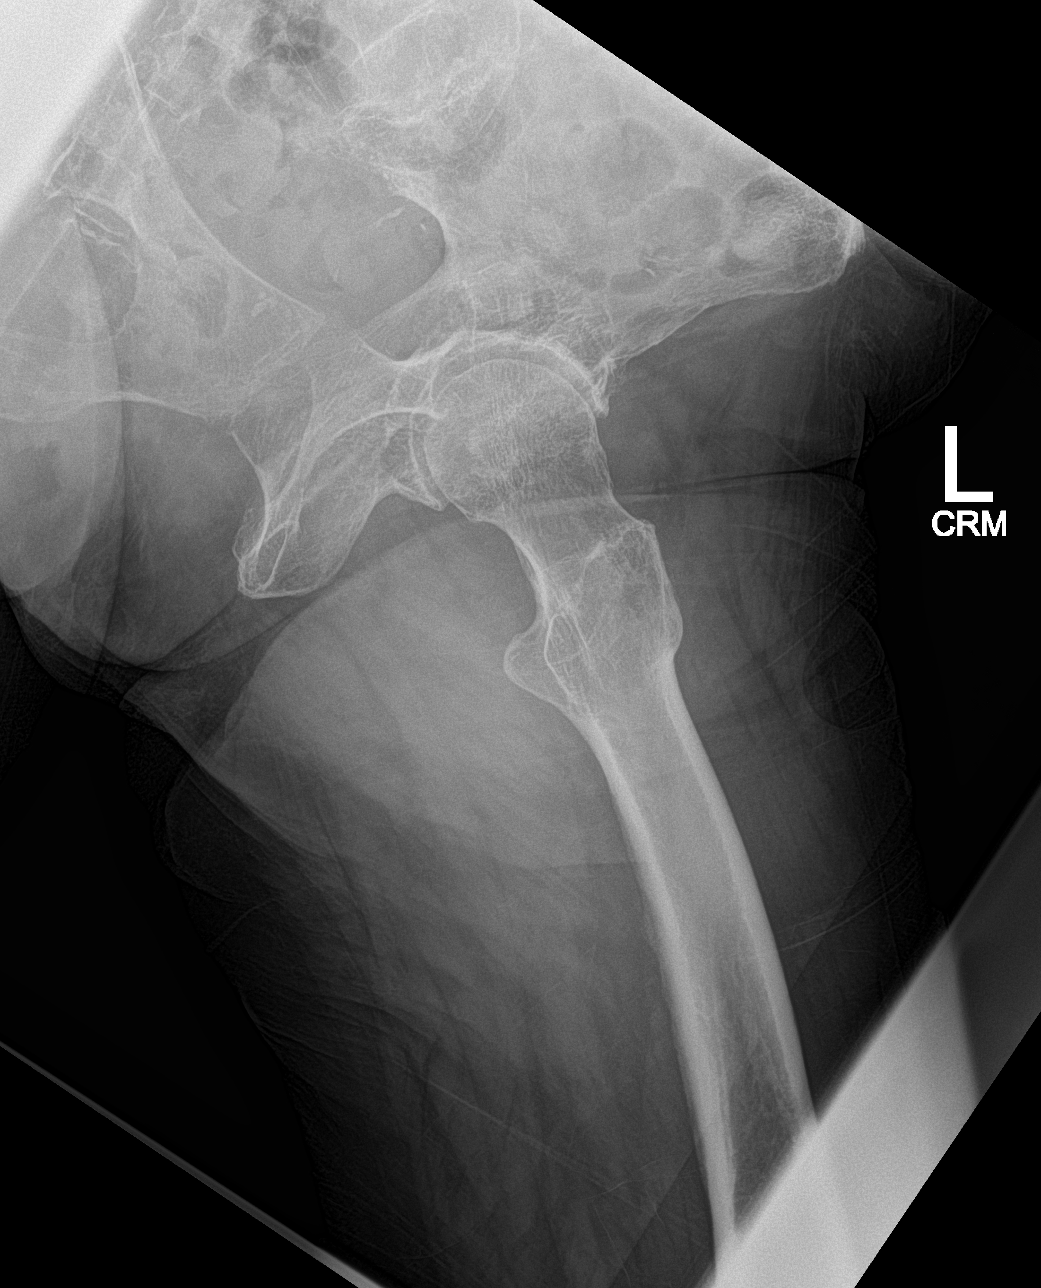

[3 of 3 positions shown; findings below may reference images not displayed]

FINDINGS: Moderate osteoarthritis changes in the hips bilaterally with joint
space narrowing and spurring. SI joints symmetric and unremarkable.
Postoperative changes in the lower lumbar spine. No acute bony
abnormality. Specifically, no fracture, subluxation, or dislocation.
IMPRESSION: Moderate osteoarthritis in the hips bilaterally. No acute bony
abnormality.

## 2019-03-14 IMAGING — CT CT CHEST WITHOUT CONTRAST
2 of 3 series · 15 of 36 positions shown, 18 images · non-contrast
Comparison: None.

CLINICAL DATA: Shortness of breath.

EXAM:
CT CHEST WITHOUT CONTRAST
TECHNIQUE: Multidetector CT imaging of the chest was performed following the
standard protocol without IV contrast.

[Series 4: chest w/o 2mm st · axial · non-contrast · 0.69mm/px · z∈[+1029,+1261]mm · 12 of 138 slices shown, 15 images]
[im 11/138  mediastinal]
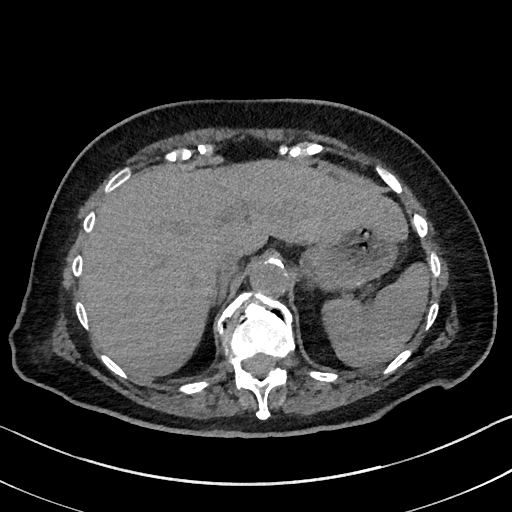
[im 11/138  lung]
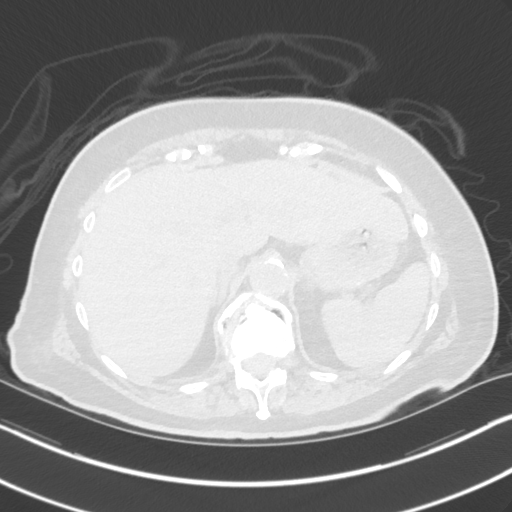
[im 21/138  lung]
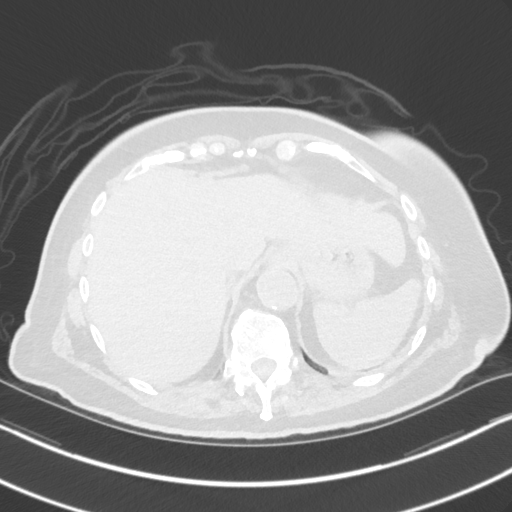
[im 31/138  lung]
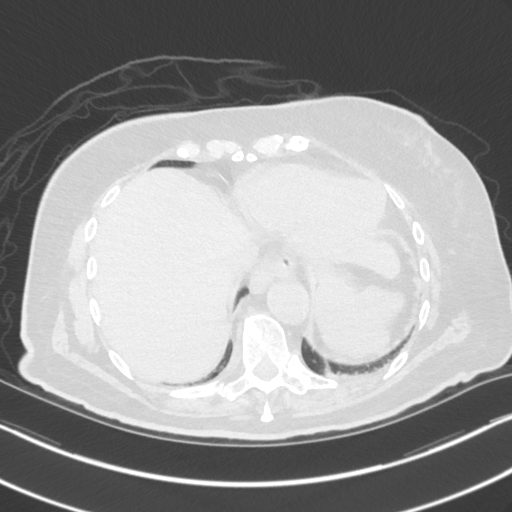
[im 41/138  lung]
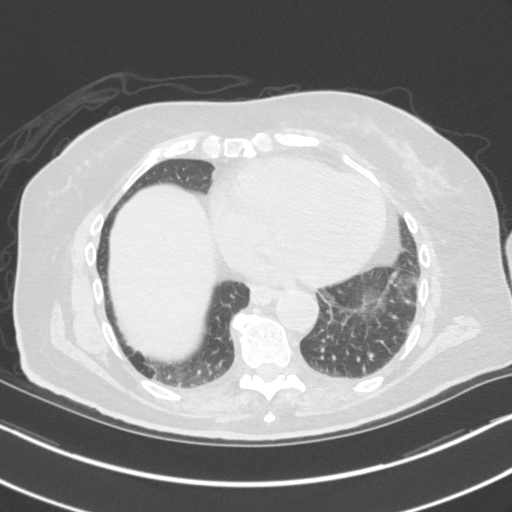
[im 51/138  mediastinal]
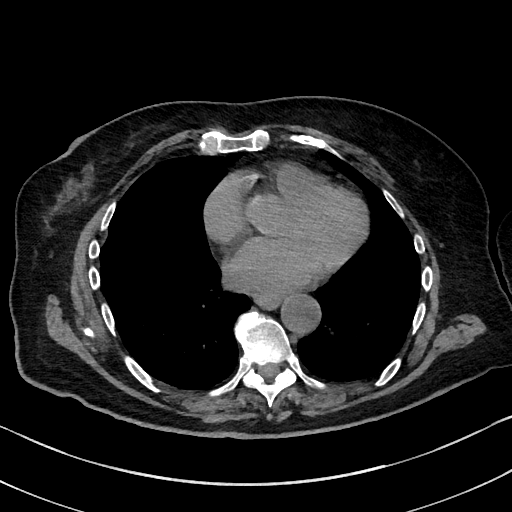
[im 51/138  lung]
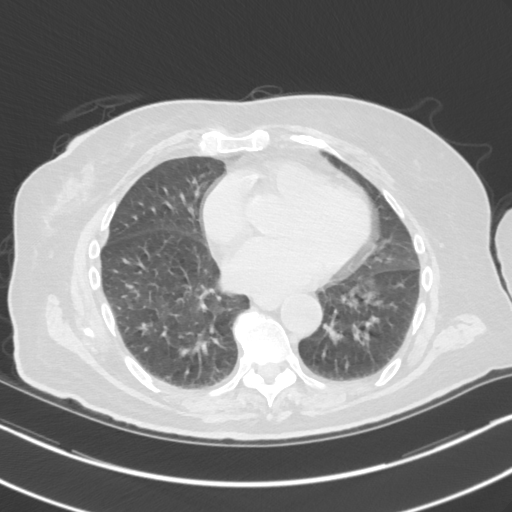
[im 61/138  lung]
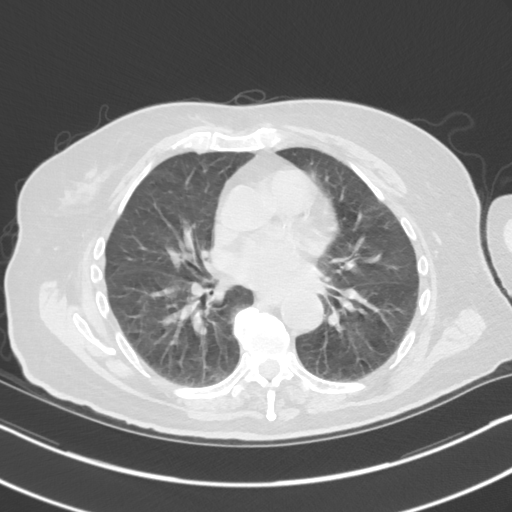
[im 77/138  lung]
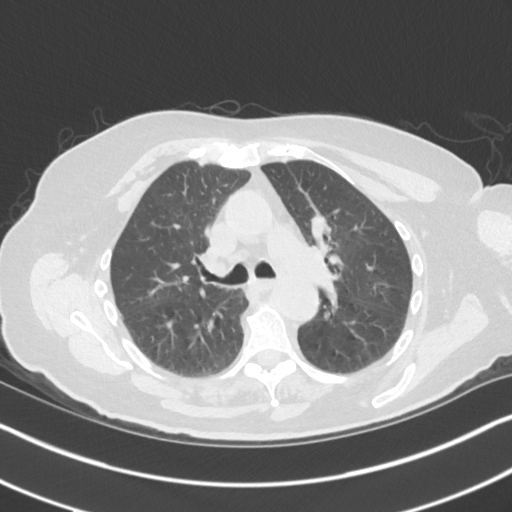
[im 87/138  lung]
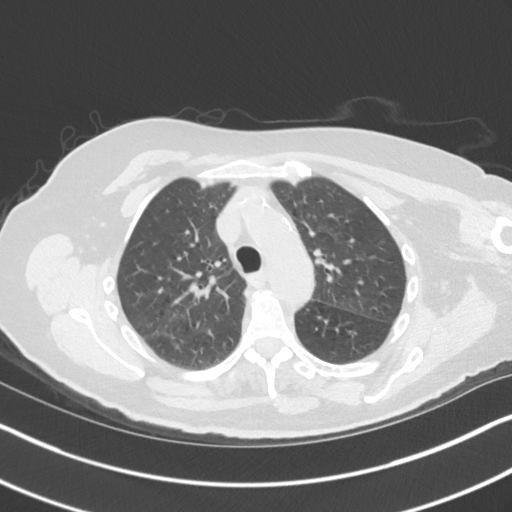
[im 97/138  mediastinal]
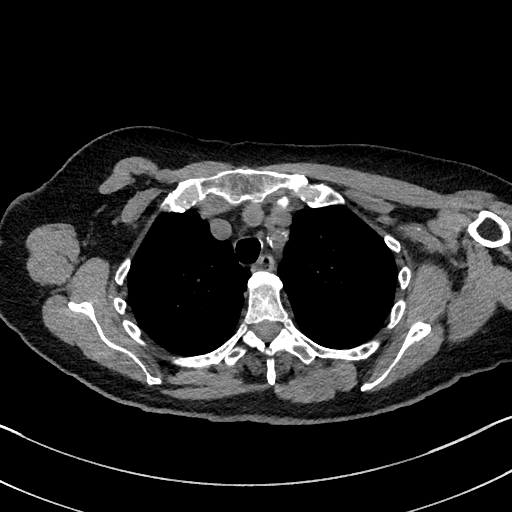
[im 97/138  lung]
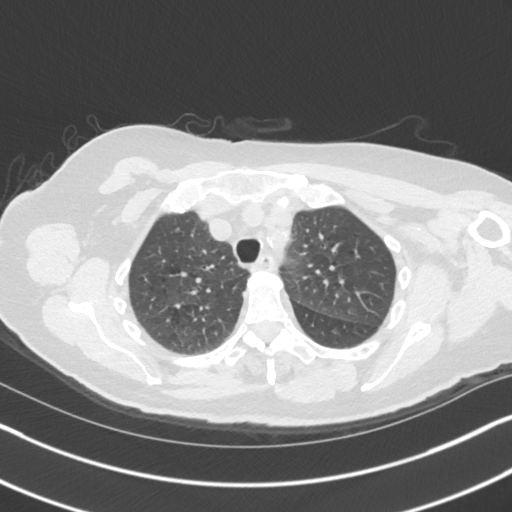
[im 107/138  lung]
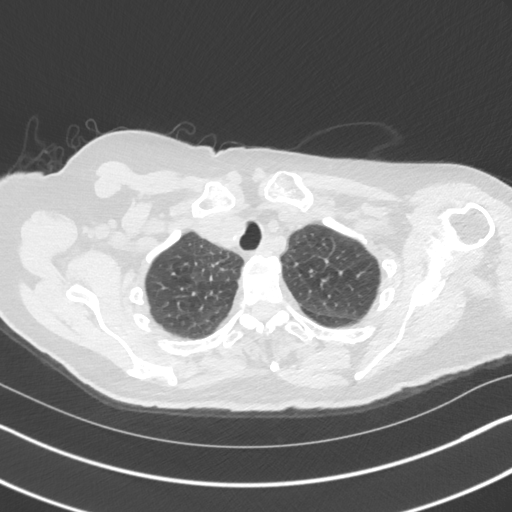
[im 117/138  lung]
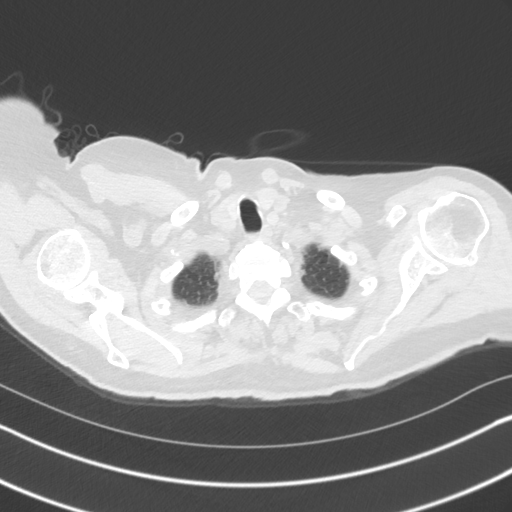
[im 127/138  lung]
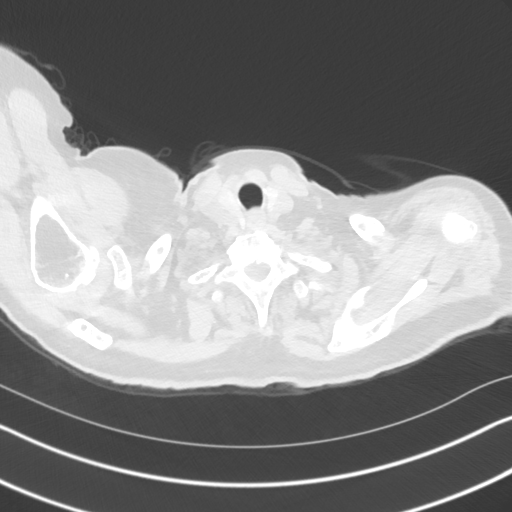

[Series 6: chest w/o 3mm st cor · coronal · non-contrast · 0.55mm/px · 3 of 73 slices shown]
[im 15/73  lung]
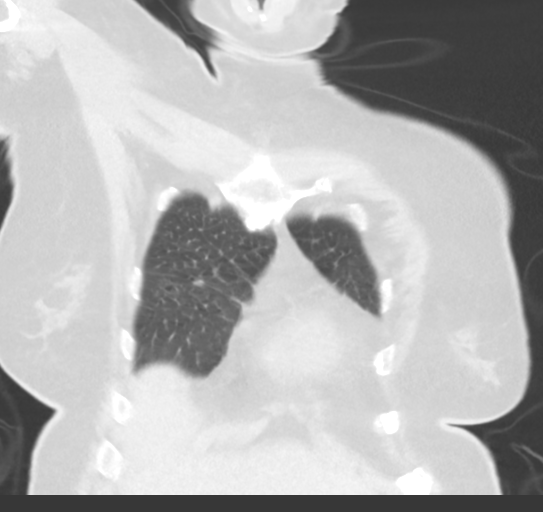
[im 29/73  lung]
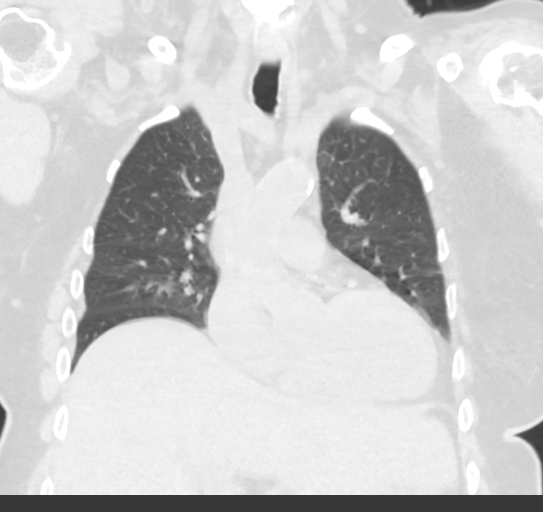
[im 44/73  lung]
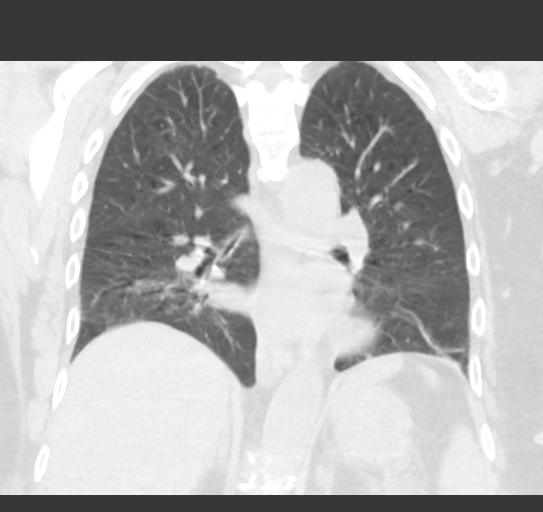

[15 of 36 positions shown; findings below may reference images not displayed]

FINDINGS: Cardiovascular: The heart size is normal. No substantial pericardial
effusion. Coronary artery calcification is evident. Atherosclerotic
calcification is noted in the wall of the thoracic aorta.

Mediastinum/Nodes: No mediastinal lymphadenopathy. No evidence for
gross hilar lymphadenopathy although assessment is limited by the
lack of intravenous contrast on today's study. Tiny hiatal hernia
evident. The esophagus has normal imaging features. There is no
axillary lymphadenopathy.

Lungs/Pleura: The central tracheobronchial airways are patent.
Centrilobular emphsyema noted. 5 mm right upper lobe nodule visible
on 48/3. 6 mm right middle lobe nodule visible on 80/3. 4 mm right
lower lobe nodule visible on 96/3. 4 mm left upper lobe nodule
visible on 59/3. Additional scattered tiny pulmonary nodules are
evident. Small airway impaction noted in the left lower lobe.
Scattered areas of mosaic attenuation are nonspecific but likely
reflect air trapping secondary to small airways disease. No pleural
effusion.

Upper Abdomen: Unremarkable.

Musculoskeletal: No worrisome lytic or sclerotic osseous
abnormality.
IMPRESSION: Emphysema with scattered bilateral pulmonary nodules measuring up to
6 mm. Non-contrast chest CT at 3-6 months is recommended. If the
nodules are stable at time of repeat CT, then future CT at 18-24
months (from today's scan) is considered optional for low-risk
patients, but is recommended for high-risk patients. This
recommendation follows the consensus statement: Guidelines for
Management of Incidental Pulmonary Nodules Detected on CT Images:

No focal airspace consolidation.

Aortic Atherosclerois ([NV]-170.0)

## 2019-03-14 NOTE — Progress Notes (Signed)
Physical Therapy Session Note  Patient Details  Name: Connie Oconnor MRN: [id number] Date of Birth: October 08, 1941  Today's Date: 03/14/2019 PT Individual Time: 1002-1045 PT Individual Time Calculation (min): 43 min   Short Term Goals: Week 3:  PT Short Term Goal 1 (Week 3): pt will balance in sitting wiht feet supported, reaching within BOS , with supervision PT Short Term Goal 2 (Week 3): pt will initiate gait training PT Short Term Goal 3 (Week 3): pt will perform stand pivot transfers with mod-min assist consistently PT Short Term Goal 4 (Week 3): Patient will propel a hemi-hieght w/c 25' with use of up to 4 extremities.   Skilled Therapeutic Interventions/Progress Updates:    Patient in supine RN in room to deliver medication.  Elevated HOB for meds, then assisted to scoot to EOB and to sit with mod to max A and increased time with max cues for technique and attention.  Patient c/o pain R LE each time moving with spasm like pain ("grabbing"). Patient seated EOB for donning pants with max A and increased time, max A for sitting balance as leaning L and forward at times especially when in pain on R hip.  Patient returned to supine with mod A and pulled up pants with max A rolling to R and L.  Rolled L with mod A and performed sit to sit max A.  Transfer to hemi height w/c max A squat pivot pt c/o pain with movement.  In w/c propelled with mod to max A with feet forward about 20' into hallway with max cues for maintaining focus on activity.  Patient sit to stand at wall rail in hallway with +2 A to change w/c to TIS.  Assisted to room in TIS and left with alarm belt activated and call bell in reach.   Therapy Documentation Precautions:  Precautions Precautions: Fall Precaution Comments: L hemi, back pain, needs time to initiate movements  Restrictions Weight Bearing Restrictions: No    Therapy/Group: Individual Therapy  Elray Mcgregor  [name], PT 03/14/2019, 10:28 AM

## 2019-03-14 NOTE — Progress Notes (Addendum)
[facility] PHYSICAL MEDICINE & REHABILITATION PROGRESS NOTE   Subjective/Complaints: Patient seen sitting up in bed this morning eating breakfast.  Nurse tech present.  She states she slept well overnight.  She remains confused.  ROS: Limited due to cognition, but appears to deny CP, shortness of breath, nausea, vomiting, diarrhea.  Objective:   Dg Chest 2 View  Result Date: 03/13/2019 CLINICAL DATA:  Leukocytosis EXAM: CHEST - 2 VIEW COMPARISON:  None. FINDINGS: The heart size and mediastinal contours are within normal limits. There is mild, diffuse bilateral interstitial pulmonary opacity. Disc degenerative disease and osteophytosis of the thoracic spine. There is a high-grade wedge deformity of a lower thoracic or upper lumbar vertebral body. IMPRESSION: There is mild, diffuse bilateral interstitial pulmonary opacity, which may reflect edema or atypical/viral infection. No evident focal airspace opacity. Electronically Signed   By: Lauralyn PrimesAlex  Bibbey M.D.   On: 03/13/2019 11:05   Recent Labs    03/12/19 1048 03/14/19 0537  WBC 13.1* 11.0*  HGB 8.9* 9.0*  HCT 27.6* 27.2*  PLT 566* 546*   Recent Labs    03/12/19 1048  NA 132*  K 3.9  CL 92*  CO2 24  GLUCOSE 115*  BUN 21  CREATININE 0.69  CALCIUM 8.5*    Intake/Output Summary (Last 24 hours) at 03/14/2019 [id number] Last data filed at 03/14/2019 0844 Gross per 24 hour  Intake 540 ml  Output 1150 ml  Net -610 ml     Physical Exam: Vital Signs Blood pressure (!) 152/60, pulse 79, temperature 97.8 F (36.6 C), resp. rate 16, height 5\' 5"  (1.651 m), weight 68.5 kg, SpO2 95 %. Constitutional: No distress . Vital signs reviewed. HENT: Normocephalic.  Atraumatic. Eyes: EOMI. No discharge. Cardiovascular: No JVD. Respiratory: Normal effort. GI: Non-distended. Musc: No edema or tenderness in extremities. Neurologic: Alert and and oriented x1, improved from yesterday Motor:  Left upper extremity: Shoulder abduction 3/5, distally  4+/5, stable Left lower extremity: 4+/5 proximal to distal, unchanged Right lower extremity: 4/5 hip flexion, knee extension, ankle dorsiflexion, stable Skin: Warm and dry.  Intact.  Assessment/Plan: 1. Functional deficits secondary to right corona radiata infarct which require 3+ hours per day of interdisciplinary therapy in a comprehensive inpatient rehab setting.  Physiatrist is providing close team supervision and 24 hour management of active medical problems listed below.  Physiatrist and rehab team continue to assess barriers to discharge/monitor patient progress toward functional and medical goals  Care Tool:  Bathing  Bathing activity did not occur: Safety/medical concerns Body parts bathed by patient: Left arm, Abdomen, Chest, Front perineal area, Right upper leg, Left upper leg, Face   Body parts bathed by helper: Right arm, Buttocks, Right lower leg, Left lower leg     Bathing assist Assist Level: Moderate Assistance - Patient 50 - 74%     Upper Body Dressing/Undressing Upper body dressing   What is the patient wearing?: Pull over shirt    Upper body assist Assist Level: Moderate Assistance - Patient 50 - 74%    Lower Body Dressing/Undressing Lower body dressing      What is the patient wearing?: Pants, Incontinence brief     Lower body assist Assist for lower body dressing: Maximal Assistance - Patient 25 - 49%     Toileting Toileting    Toileting assist Assist for toileting: Dependent - Patient 0%     Transfers Chair/bed transfer  Transfers assist     Chair/bed transfer assist level: Moderate Assistance - Patient 50 - 74%  Locomotion Ambulation   Ambulation assist   Ambulation activity did not occur: Safety/medical concerns(fatigue/pain )  Assist level: Moderate Assistance - Patient 50 - 74% Assistive device: Walker-rolling(and L hand splint) Max distance: 5'   Walk 10 feet activity   Assist  Walk 10 feet activity did not occur:  Safety/medical concerns(fatigue/pain )        Walk 50 feet activity   Assist Walk 50 feet with 2 turns activity did not occur: Safety/medical concerns(fatigue/pain )         Walk 150 feet activity   Assist Walk 150 feet activity did not occur: Safety/medical concerns(fatigue/pain )         Walk 10 feet on uneven surface  activity   Assist Walk 10 feet on uneven surfaces activity did not occur: Safety/medical concerns(fatigue/pain )         Wheelchair     Assist Will patient use wheelchair at discharge?: Yes Type of Wheelchair: Manual    Wheelchair assist level: Supervision/Verbal cueing Max wheelchair distance: 15'    Wheelchair 50 feet with 2 turns activity    Assist    Wheelchair 50 feet with 2 turns activity did not occur: Safety/medical concerns(fatigue/pain )       Wheelchair 150 feet activity     Assist Wheelchair 150 feet activity did not occur: Safety/medical concerns(fatigue/pain )        Medical Problem List and Plan: 1.Left side weaknesssecondary to 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  Cont CIR Plan outpatient coiling of aneurysm per interventional radiology  Will consider anti-spasticity medications if necessary, not appropriate at this point  Team conference today to discuss current and goals and coordination of care, home and environmental barriers, and discharge planning with nursing, case manager, and therapies.  2. Antithrombotics: -DVT/anticoagulation:SCDs.  Lower extremity Dopplers negative DVT -antiplatelet therapy: aspirin 81 mg daily Plavix 75 mg daily 3 weeks and Plavix alone 3. Pain Management/migraine headaches:Tramadolasneeded Scheduled Flexeril (home med) for right hand cramps/back Added kpad for low back  Muscle rub  ordered for left calf and right thigh- improved 4. Mood:Provide emotional support -antipsychotic agents: N/A 5. Neuropsych: This patientis not capable of making decisions on herown behalf.  Continue tele-sitter for safety  Needs significant encouragement 6. Skin/Wound Care:Routine skin checks 7. Fluids/Electrolytes/Nutrition:Routine in and outs  D3 thins, advance diet as tolerated 8. History of traumatic [facility] May 2018. Patient seen in the past by Dr. Conchita Paris 9. Hypertension.  Vitals:   03/13/19 2034 03/14/19 0429  BP: 136/69 (!) 152/60  Pulse: 75 79  Resp: 18 16  Temp: 98.1 F (36.7 C) 97.8 F (36.6 C)  SpO2: 94% 95%   Norvasc 10 mg daily, decreased to 5 mg on 4/26  Labile on 5/13 10. Hyperlipidemia. Lipitor 11. Tobacco abuse. Counseling 12.  Hyperglycemia  Hemoglobin A1c 5.5 on 01/2019  P.o. intake remains inconsistent on 5/13  Patient needs significant time and cueing to encourage intake. 13.  Acute blood loss anemia  Hemoccult ordered and positive, repeat negative, positive again on 5/11  Hgb 9.0 on 5/13  Added Fe++ supp 14.  Hypoalbuminemia  Supplement initiated on 4/27 15.  Slow transit constipation  Bowel regimen increased on 5/3, increased again on 5/4  Improving 16. Low grade fevers: Improved  WBCs 11.0 on 5/13  Urine culture with MRSA  Completed Macrobid started on 5/6-5/13  Repeat UA relatively unremarkable,  repeat urine culture pending  Chest x-ray reviewed showing?  Abnormalities.  Will consider further work-up after discussion with Pulm. Discussed with PA.  Alertness has improved, but remains confused,?  Delirium versus baseline dementia     LOS: 19 days A FACE TO FACE EVALUATION WAS PERFORMED  Zhara Gieske Karis Juba 03/14/2019, 9:07 AM

## 2019-03-14 NOTE — Progress Notes (Signed)
During the Covid 19 public health emergency, the inpatient rehabilitation unit of the [facility] will use acute care beds on another unit to provide each patient with a private room. This effort is to assure proper infection control and to optimize care management during this public health emergency.     

## 2019-03-14 NOTE — Progress Notes (Signed)
Patient restful during the shift, w/o complaints, oriented x2 Tele-sitter remains in place,and fall precaution ,Denies pain and discomfort. Closely monitor

## 2019-03-14 NOTE — Progress Notes (Addendum)
Occupational Therapy Session Note  Patient Details  Name: Connie Oconnor MRN: [id number] Date of Birth: Apr 26, 1941  Today's Date: 03/14/2019 OT Individual Time: 7867-5449 OT Individual Time Calculation (min): 60 min   Session 2:  OT Individual Time: 1330-1445 OT Individual Time Calculation (min): 75 min   Short Term Goals: Week 3:  OT Short Term Goal 1 (Week 3): STG=LTG 2/2 ELOS  Skilled Therapeutic Interventions/Progress Updates:    Pt received bed level with c/o pain in back, NT finishing up cath. Pt required more than reasonable time for initiating all movement this session with moderate cueing required. Pt rolled R and L with min A and brief was donned max A. Attempted to have pt complete posterior peri hygiene but pt refusing. Pt transitioned to EOB with mod A with manual facilitation for LE and lifting assistance. Pt had L lateral lean EOB and required verbal and tactile cueing to maintain midline orientation. Pt took several bites of her ice cream with HOH assistance to hold fork in LUE. Pt declined any OOB mobility and suddenly returned to supine. From supine pt completed oral hygiene with set up assistance. With bed in trend position pt able to scoot up with moderate cueing. Pt held towel in both hands and completed closed chain functional reaching for L UE NMR. Pt left supine with all needs met and bed alarm set.   Session 2: Session focused on task initiation, attention, sitting balance, and ADLs. Pt received supine with no c/o pain. Pt required more than reasonable time for initiating all movement this session with moderate-maximal cueing required. Pt transferred to EOB with mod A, several times upon initiation of task, pt would yell out in pain and abruptly return back to supine. When questioned where pain was pt would answer "back" and requested no intervention. Pt required manual facilitation, verbal and tactile cueing to initiate every movement to EOB. Once EOB, pt unable to  maintain sitting balance without mod-max A. Pt demonstrated strong posterior and L lateral lean. Pt required mod A to doff shirt with moderate cueing for technique. Pt able to wash under LUE and apply deodorant. Min manual facilitation provided for LUE to reach under RUE. Pt required frequent redirection to task throughout session. Several times pt was given time to attempt and allow her to initiate herself but pt would either fall asleep or start conversation on different subject. Pt required max A to don shirt sitting EOB. Pt was assisted in consuming magic up with no overt s/s of aspiration. Pt left supine with all needs met, bed alarm set.  Therapy Documentation Precautions:  Precautions Precautions: Fall Precaution Comments: L hemi, back pain, needs time to initiate movements  Restrictions Weight Bearing Restrictions: No   Therapy/Group: Individual Therapy  Curtis Sites 03/14/2019, 7:19 AM

## 2019-03-14 NOTE — Progress Notes (Signed)
  Met with pt and spoke with Cindy-daughter per telephone to inform of team conference goals min-mod level and that they have been downgraded due to lack of progress. Pt complaining of hip pain and MD has ordered an x-ray and also an x-ray of her chest to check due to pt's confusion has not cleared after treating UTI. Daughter's concerned about her confusion and decline since admission to rehab. Best decision at this time is to pursue NHP to continue rehab and be managed medically. Will begin process.

## 2019-03-14 NOTE — Patient Care Conference (Signed)
Inpatient RehabilitationTeam Conference and Plan of Care Update Date: 03/14/2019   Time: 2:20 PM    Patient Name: Connie Oconnor      Medical Record Number: [id number]  Date of Birth: 25-Feb-1941 Sex: Female         Room/Bed: 5N20C/5N20C-01 Payor Info: Payor: [employer] MEDICARE / Plan: [employer] MEDICARE / Product Type: *No Product type* /    Admitting Diagnosis: Rt CVA; 22-24days  Admit Date/Time:  02/23/2019  5:02 PM Admission Comments: No comment available   Primary Diagnosis:  <principal problem not specified> Principal Problem: <principal problem not specified>  Patient Active Problem List   Diagnosis Date Noted  . Lethargy   . Confusion, postoperative   . Acute lower UTI   . Leukocytosis   . Slow transit constipation   . Pain of left calf   . Impulsiveness   . Dysphagia, post-stroke   . Labile blood pressure   . Hyperglycemia   . Hypotension   . Hypoalbuminemia due to protein-calorie malnutrition ([place])   . Acute blood loss anemia   . Hypoglycemia   . Essential hypertension   . History of traumatic brain injury   . Lacunar infarct, acute ([place]) 02/23/2019  . 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 Right-sided lacunar infarction ([place]) 02/20/2019  . Hypothyroidism (acquired) 02/20/2019  . Closed fracture of medial portion of right tibial plateau 01/11/2018  . Acute medial meniscal tear, right, initial encounter 01/11/2018  . SAH (subarachnoid hemorrhage) ([place]) 03/29/2017    Expected Discharge Date: Expected Discharge Date: 03/17/19  Team Members Present: Physician leading conference: Dr. 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   Improve mobility, safety, BP, confusion  See above   Bowel/Bladder   Remain continent/Incontient B/B, LBM 03/13/19   Encourage timed toileting.  QS assess toileting needs, prn   Swallow/Nutrition/ Hydration   Dys. 3 textures with thin liquids, Min A   Mod I  tolerance of current diet    ADL's   mod A bathing with max A for UB dressing; tot A LB dressing; squat pivot transfers-mod A; decreased awareness and decreased LUE function  downgraded-bathing-min A; UB dressing-min A; LB dressing-mod A; toilet transfers/toileting-mod A  functional transfers, sitting balance, LUE NMR, activity tolerance, increase awareness/attention   Mobility   max assist bed mobility due to R hip pain, mod assist squat pivot transfers, +2 sit>< stand with wall bar, max assist w/c x 20' using bil LEs  LTGs downgraded to mod assist for 50% of bed mobility, mod assist consistently basic transfers, mod assist w/c propulsion x 50'. No standing balance or gait goals.  activity tolerance, transfers, midline orientation, leans L and forward, w/c propulsion in hemi ht wc/   Communication   Mod I   Mod I  Goals Met    Safety/Cognition/ Behavioral Observations  Mod-Max A   Mod A   attention, problem solving    Pain      Denies Pain  QS/PRN assess   Skin   No skin issues,   Maintain skin integrity and prevent skin  breakdown.  Assess QS/PRN       *See Care Plan and progress notes for long and short-term goals.     Barriers to Discharge  Current Status/Progress Possible Resolutions Date Resolved   Physician    Medical stability;Decreased caregiver support;Lack of/limited family support     See above  Therapies, follow vitals, infectious workup      Nursing                  PT                    OT                  SLP                SW                Discharge Planning/Teaching  Needs:  Daughter's feel they can not provide the 24 hr care will need to pursue NHP from rehab.      Team Discussion:  Downgraded goals to min-mod level. Medical issues-chest x-ray and CT of lung today. Also ordered hip x-ray due to hip pain has been complaining of for a week.Intermittently having to I & O cath. Adjusting BP meds. Treated UTI. Speech max attention and problem solving. Daughter' want to pursue NHP can't manage her at this level.  Revisions to Treatment Plan:  Plan changed to NHP    Continued Need for Acute Rehabilitation Level of Care: The patient requires daily medical management by a physician with specialized training in physical medicine and rehabilitation for the following conditions: Daily direction of a multidisciplinary physical rehabilitation program to ensure safe treatment while eliciting the highest outcome that is of practical value to the patient.: Yes Daily medical management of patient stability for increased activity during participation in an intensive rehabilitation regime.: Yes Daily analysis of laboratory values and/or radiology reports with any subsequent need for medication adjustment of medical intervention for : Neurological problems;Diabetes problems;Blood pressure problems   I attest that I was present, lead the team conference, and concur with the assessment and plan of the team. Teleconference held due to [place], [name] 03/15/2019, 8:50 AM

## 2019-03-15 ENCOUNTER — Inpatient Hospital Stay (HOSPITAL_COMMUNITY): Payer: Medicare Other

## 2019-03-15 ENCOUNTER — Inpatient Hospital Stay (HOSPITAL_COMMUNITY): Payer: Medicare Other | Admitting: Occupational Therapy

## 2019-03-15 ENCOUNTER — Inpatient Hospital Stay (HOSPITAL_COMMUNITY): Payer: Medicare Other | Admitting: Speech Pathology

## 2019-03-15 DIAGNOSIS — F05 Delirium due to known physiological condition: Secondary | ICD-10-CM

## 2019-03-15 DIAGNOSIS — R410 Disorientation, unspecified: Secondary | ICD-10-CM

## 2019-03-15 DIAGNOSIS — J439 Emphysema, unspecified: Secondary | ICD-10-CM

## 2019-03-15 DIAGNOSIS — M1611 Unilateral primary osteoarthritis, right hip: Secondary | ICD-10-CM

## 2019-03-15 DIAGNOSIS — R918 Other nonspecific abnormal finding of lung field: Secondary | ICD-10-CM

## 2019-03-15 LAB — BLOOD GAS, ARTERIAL
Acid-Base Excess: 2.2 mmol/L — ABNORMAL HIGH (ref 0.0–2.0)
Bicarbonate: 25.2 mmol/L (ref 20.0–28.0)
Drawn by: 213381
FIO2: 21
O2 Saturation: 93.9 %
Patient temperature: 98.7
pCO2 arterial: 32.8 mmHg (ref 32.0–48.0)
pH, Arterial: 7.498 — ABNORMAL HIGH (ref 7.350–7.450)
pO2, Arterial: 68.1 mmHg — ABNORMAL LOW (ref 83.0–108.0)

## 2019-03-15 IMAGING — CT CT HEAD WITHOUT CONTRAST
4 series · 17 of 47 positions shown, 19 images · non-contrast
Comparison: Brain MRI [DATE].  Head CT [DATE].

CLINICAL DATA: Altered level of consciousness today.

EXAM:
CT HEAD WITHOUT CONTRAST
TECHNIQUE: Contiguous axial images were obtained from the base of the skull
through the vertex without intravenous contrast.

[Series 3: head wo · axial · 0.41mm/px · z∈[-117,+13]mm · 7 of 36 slices shown, 9 images]
[im 5/36  brain]
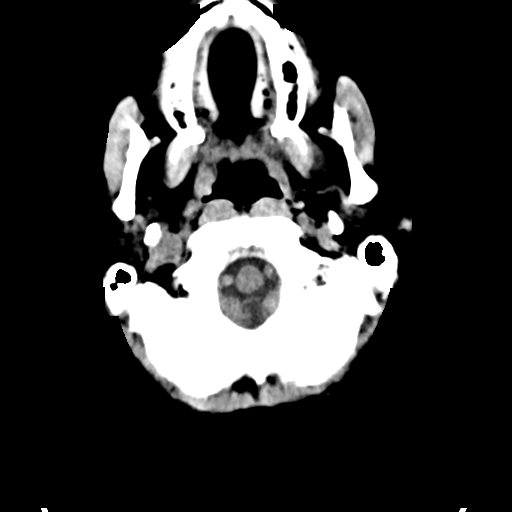
[im 5/36  bone]
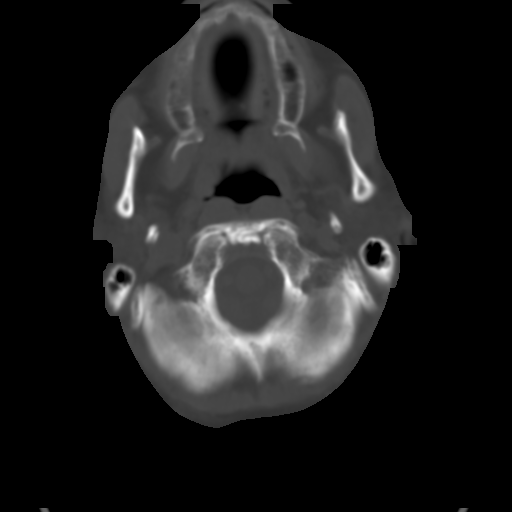
[im 9/36  brain]
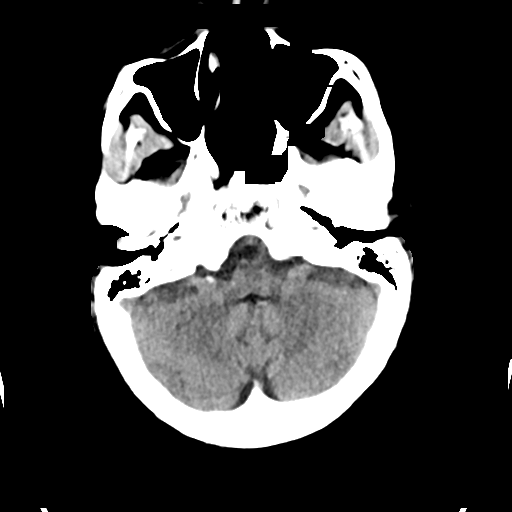
[im 14/36  brain]
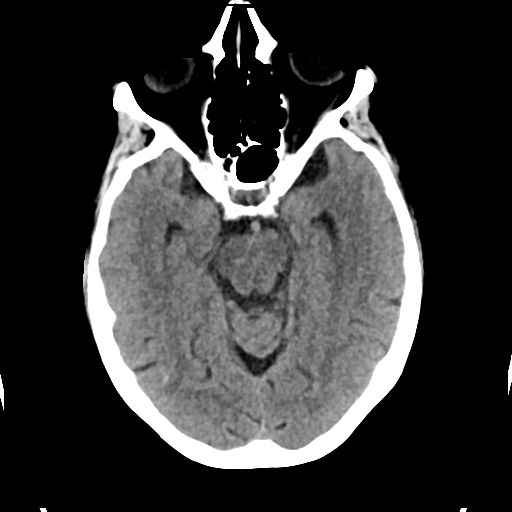
[im 18/36  brain]
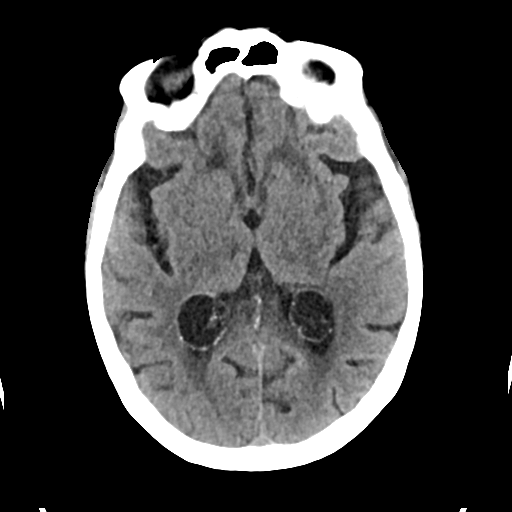
[im 22/36  brain]
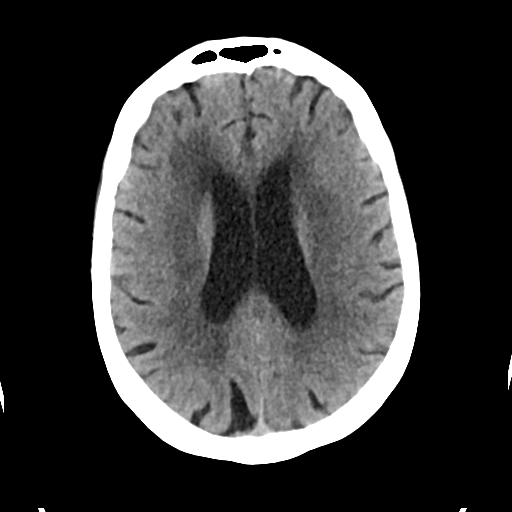
[im 22/36  bone]
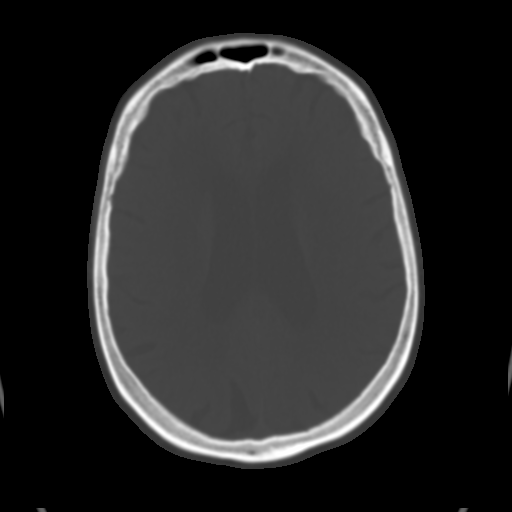
[im 27/36  brain]
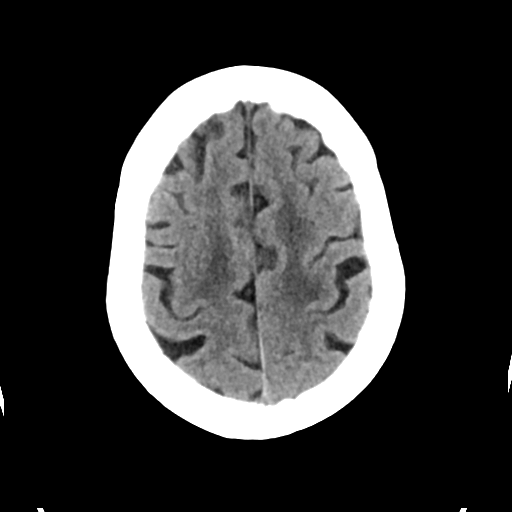
[im 31/36  brain]
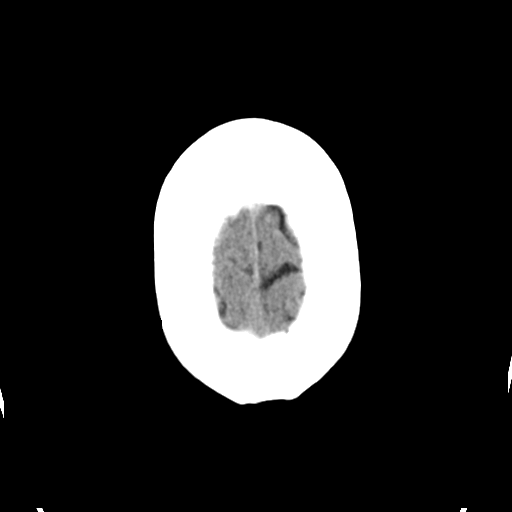

[Series 4: head bone · axial · 0.41mm/px · z∈[-121,-57]mm · 4 of 90 slices shown]
[im 9/90  bone]
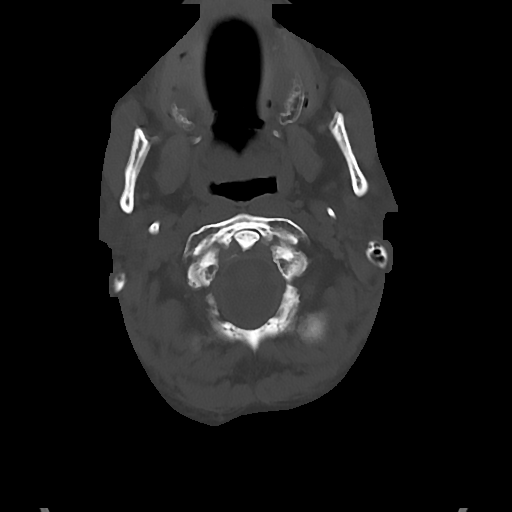
[im 18/90  bone]
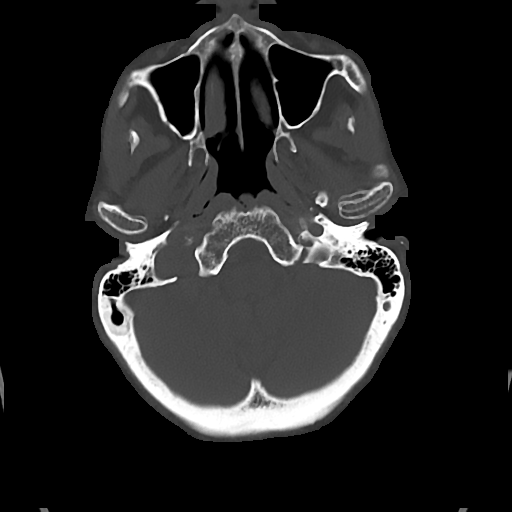
[im 27/90  bone]
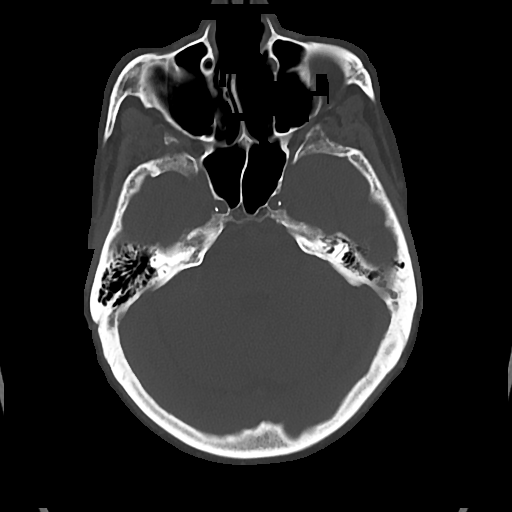
[im 41/90  bone]
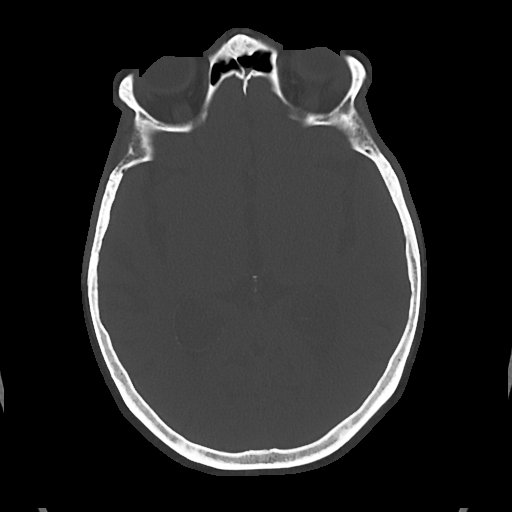

[Series 5: cor soft · coronal · 0.35mm/px · 3 of 71 slices shown]
[im 24/71  brain]
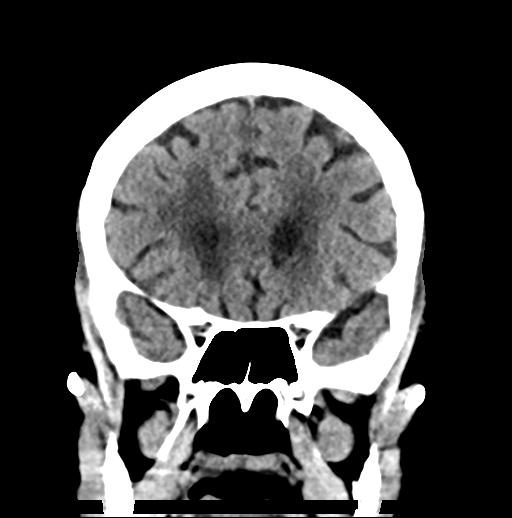
[im 32/71  brain]
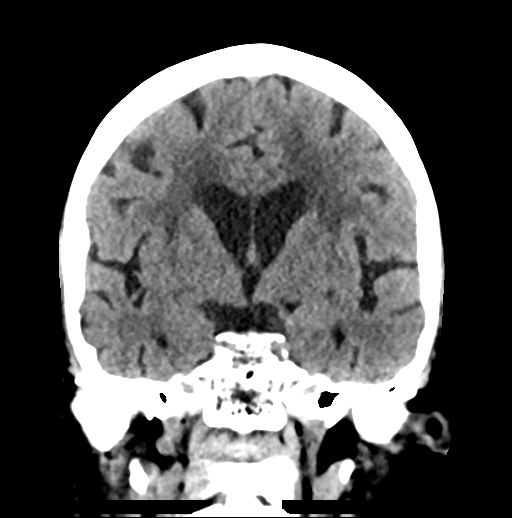
[im 39/71  brain]
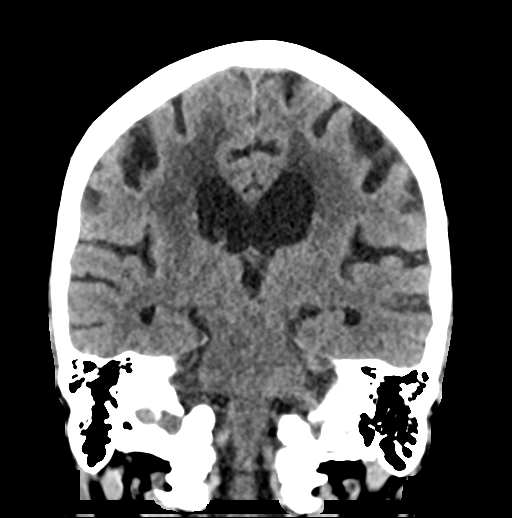

[Series 6: sag soft · sagittal · 0.35mm/px · 3 of 58 slices shown]
[im 20/58  brain]
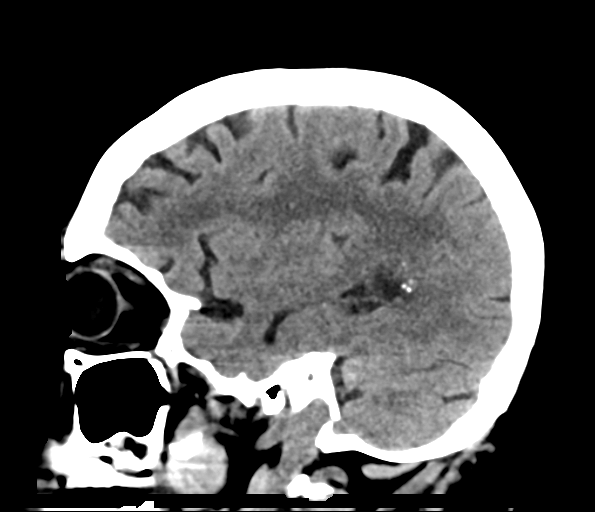
[im 29/58  brain]
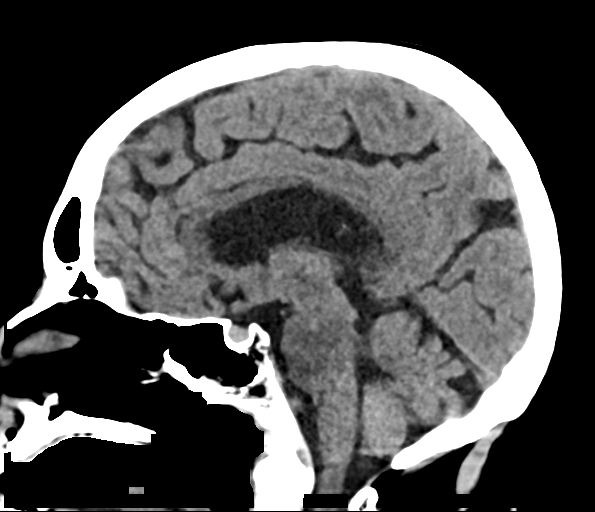
[im 39/58  brain]
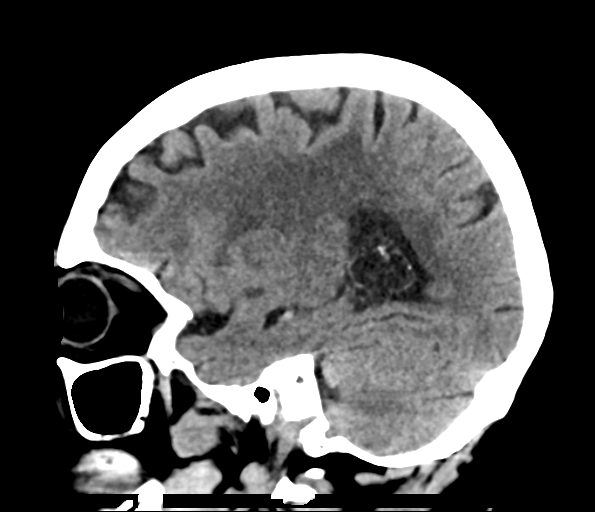

[17 of 47 positions shown; findings below may reference images not displayed]

FINDINGS: Brain: No evidence of acute infarction, hemorrhage, hydrocephalus,
extra-axial collection or mass lesion/mass effect. Extensive chronic
microvascular ischemic change noted.

Vascular: No hyperdense vessel or unexpected calcification.

Skull: Intact.  No focal lesion.

Sinuses/Orbits: Negative.

Other: None.
IMPRESSION: No acute abnormality.

Extensive chronic microvascular ischemic change.

## 2019-03-15 MED ORDER — DICLOFENAC SODIUM 1 % TD GEL
2.0000 g | Freq: Four times a day (QID) | TRANSDERMAL | Status: DC
  Administered 2019-03-15 – 2019-03-27 (×40): 2 g via TOPICAL
  Filled 2019-03-15 (×2): qty 100

## 2019-03-15 MED ORDER — NICOTINE 14 MG/24HR TD PT24
14.0000 mg | MEDICATED_PATCH | Freq: Every day | TRANSDERMAL | Status: DC
  Administered 2019-03-15 – 2019-03-27 (×13): 14 mg via TRANSDERMAL
  Filled 2019-03-15 (×13): qty 1

## 2019-03-15 NOTE — [place] (Signed)
[place] PHYSICAL MEDICINE & REHABILITATION PROGRESS NOTE   Subjective/Complaints: Patient seen laying in bed this morning.  He states he slept well overnight.  She remains confused.  ROS: Limited due to cognition, but appears to deny CP, shortness of breath, nausea, vomiting, diarrhea.  Objective:   Ct Chest Wo Contrast  Result Date: 03/14/2019 CLINICAL DATA:  Shortness of breath. EXAM: CT CHEST WITHOUT CONTRAST TECHNIQUE: Multidetector CT imaging of the chest was performed following the standard protocol without IV contrast. COMPARISON:  None. FINDINGS: Cardiovascular: The heart size is normal. No substantial pericardial effusion. Coronary artery calcification is evident. Atherosclerotic calcification is noted in the wall of the thoracic aorta. Mediastinum/Nodes: No mediastinal lymphadenopathy. No evidence for gross hilar lymphadenopathy although assessment is limited by the lack of intravenous contrast on today's study. Tiny hiatal hernia evident. The esophagus has normal imaging features. There is no axillary lymphadenopathy. Lungs/Pleura: The central tracheobronchial airways are patent. Centrilobular emphsyema noted. 5 mm right upper lobe nodule visible on 48/3. 6 mm right middle lobe nodule visible on 80/3. 4 mm right lower lobe nodule visible on 96/3. 4 mm left upper lobe nodule visible on 59/3. Additional scattered tiny pulmonary nodules are evident. Small airway impaction noted in the left lower lobe. Scattered areas of mosaic attenuation are nonspecific but likely reflect air trapping secondary to small airways disease. No pleural effusion. Upper Abdomen: Unremarkable. Musculoskeletal: No worrisome lytic or sclerotic osseous abnormality. IMPRESSION: Emphysema with scattered bilateral pulmonary nodules measuring up to 6 mm. Non-contrast chest CT at 3-6 months is recommended. If the nodules are stable at time of repeat CT, then future CT at 18-24 months (from today's scan) is considered  optional for low-risk patients, but is recommended for high-risk patients. This recommendation follows the consensus statement: Guidelines for Management of Incidental Pulmonary Nodules Detected on CT Images: From the Fleischner Society 2017; Radiology 2017; 284:228-243. No focal airspace consolidation. Aortic Atherosclerois (ICD10-170.0) Electronically Signed   By: Kennith CenterEric  Mansell M.D.   On: 03/14/2019 12:25   Dg Hip Unilat With Pelvis 2-3 Views Left  Result Date: 03/14/2019 CLINICAL DATA:  Left hip pain for 1 month EXAM: DG HIP (WITH OR WITHOUT PELVIS) 2-3V LEFT COMPARISON:  Chest FINDINGS: Moderate osteoarthritis changes in the hips bilaterally with joint space narrowing and spurring. SI joints symmetric and unremarkable. Postoperative changes in the lower lumbar spine. No acute bony abnormality. Specifically, no fracture, subluxation, or dislocation. 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 No acute bony abnormality. Electronically Signed   By: Charlett NoseKevin  Dover M.D.   On: 03/14/2019 15:44   Recent Labs    03/14/19 0537  WBC 11.0*  HGB 9.0*  HCT 27.2*  PLT 546*   No results for input(s): NA, K, CL, CO2, GLUCOSE, BUN, CREATININE, CALCIUM in the last 72 hours.  Intake/Output Summary (Last 24 hours) at 03/15/2019 1155 Last data filed at 03/15/2019 0904 Gross per 24 hour  Intake 340 ml  Output 775 ml  Net -435 ml     Physical Exam: Vital Signs Blood pressure (!) 138/56, pulse 79, temperature 98.4 F (36.9 C), temperature source Oral, resp. rate 19, height 5\' 5"  (1.651 m), weight 68.5 kg, SpO2 94 %. Constitutional: No distress . Vital signs reviewed. HENT: Normocephalic.  Atraumatic. Eyes: EOMI. No discharge. Cardiovascular: No JVD. Respiratory: Normal effort. GI: Non-distended. Neurologic: Alert and and oriented x1 Motor:  Left upper extremity: Shoulder abduction 3/5, distally 4+/5, unchanged Left lower extremity: 4+/5 proximal to distal, unchanged Right lower  extremity: 4/5  hip flexion, knee extension, ankle dorsiflexion, (variable with participation) Skin: Warm and dry.  Intact.  Assessment/Plan: 1. Functional deficits secondary to right corona radiata infarct which require 3+ hours per day of interdisciplinary therapy in a comprehensive inpatient rehab setting.  Physiatrist is providing close team supervision and 24 hour management of active medical problems listed below.  Physiatrist and rehab team continue to assess barriers to discharge/monitor patient progress toward functional and medical goals  Care Tool:  Bathing  Bathing activity did not occur: Safety/medical concerns Body parts bathed by patient: Right arm, Left arm, Chest, Abdomen   Body parts bathed by helper: Right arm, Buttocks, Right lower leg, Left lower leg     Bathing assist Assist Level: Moderate Assistance - Patient 50 - 74%     Upper Body Dressing/Undressing Upper body dressing   What is the patient wearing?: Pull over shirt    Upper body assist Assist Level: Maximal Assistance - Patient 25 - 49%    Lower Body Dressing/Undressing Lower body dressing      What is the patient wearing?: Pants, Incontinence brief     Lower body assist Assist for lower body dressing: Maximal Assistance - Patient 25 - 49%     Toileting Toileting    Toileting assist Assist for toileting: Dependent - Patient 0%     Transfers Chair/bed transfer  Transfers assist     Chair/bed transfer assist level: Moderate Assistance - Patient 50 - 74%     Locomotion Ambulation   Ambulation assist   Ambulation activity did not occur: Safety/medical concerns(fatigue/pain )  Assist level: Moderate Assistance - Patient 50 - 74% Assistive device: Walker-rolling(and L hand splint) Max distance: 5'   Walk 10 feet activity   Assist  Walk 10 feet activity did not occur: Safety/medical concerns(fatigue/pain )        Walk 50 feet activity   Assist Walk 50 feet with 2 turns  activity did not occur: Safety/medical concerns(fatigue/pain )         Walk 150 feet activity   Assist Walk 150 feet activity did not occur: Safety/medical concerns(fatigue/pain )         Walk 10 feet on uneven surface  activity   Assist Walk 10 feet on uneven surfaces activity did not occur: Safety/medical concerns(fatigue/pain )         Wheelchair     Assist Will patient use wheelchair at discharge?: Yes Type of Wheelchair: Manual    Wheelchair assist level: Supervision/Verbal cueing Max wheelchair distance: 15'    Wheelchair 50 feet with 2 turns activity    Assist    Wheelchair 50 feet with 2 turns activity did not occur: Safety/medical concerns(fatigue/pain )       Wheelchair 150 feet activity     Assist Wheelchair 150 feet activity did not occur: Safety/medical concerns(fatigue/pain )        Medical Problem List and Plan: 1.Left side weaknesssecondary to 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  Cont CIR Plan outpatient coiling of aneurysm per interventional radiology  Will consider anti-spasticity medications if necessary, not appropriate at this point  Attempted to call Ainsley Spinner, however phone number not in service 2. Antithrombotics: -DVT/anticoagulation:SCDs.  Lower extremity Dopplers negative DVT -antiplatelet therapy: aspirin 81 mg daily Plavix 75 mg daily 3 weeks and Plavix alone 3. Pain Management/migraine headaches:Tramadol and Flexeril DC'd-monitor for improvement  Added kpad for  low back  Muscle rub ordered for left calf and right thigh- improved 4. Mood:Provide emotional support -antipsychotic agents: N/A 5. Neuropsych: This patientis not capable of making decisions on herown behalf.  Continue tele-sitter for safety  Needs significant  encouragement 6. Skin/Wound Care:Routine skin checks 7. Fluids/Electrolytes/Nutrition:Routine in and outs  D3 thins, advance diet as tolerated 8. History of traumatic [facility]. Patient seen in the past by Dr. Conchita Paris 9. Hypertension.  Vitals:   03/14/19 1928 03/15/19 0630  BP: (!) 146/55 (!) 138/56  Pulse: 81 79  Resp: 19   Temp: 99.8 F (37.7 C) 98.4 F (36.9 C)  SpO2: 95% 94%   Norvasc 10 mg daily, decreased to 5 mg on 4/26  Slightly labile on 5/14 10. Hyperlipidemia. Lipitor 11. Tobacco abuse. Counseling 12.  Hyperglycemia  Hemoglobin A1c 5.5 on 01/2019  P.o. intake remains inconsistent on 5/14  Patient needs significant time and cueing to encourage intake. 13.  Acute blood loss anemia  Hemoccult ordered and positive, repeat negative, positive again on 5/11  Hgb 9.0 on 5/13  Added Fe++ supp 14.  Hypoalbuminemia  Supplement initiated on 4/27 15.  Slow transit constipation  Bowel regimen increased on 5/3, increased again on 5/4  Improving 16.  Confusion  WBCs 11.0 on 5/13  Urine culture with MRSA  Completed Macrobid started on 5/6-5/13  Repeat UA relatively unremarkable, repeat urine culture no growth  CT chest reviewed showing emphysema with scattered pulmonary nodules with outpatient follow-up scan.  Discussed with PA.  Alertness has improved, but remains confused,?  Delirium versus baseline dementia  CMP ordered for tomorrow  Ammonia ordered for tomorrow 17.  Hip OA  Bilaterally noted on x-ray, personally reviewed.  Voltaren gel ordered 18.  Emphysema with scattered pulmonary nodules  Repeat chest CT as outpatient  Noted on CT  ABG ordered     LOS: 20 days A FACE TO FACE EVALUATION WAS PERFORMED  Connie Oconnor Karis Juba 03/15/2019, 11:55 AM

## 2019-03-15 NOTE — [place] (Signed)
Speech Language Pathology Daily Session Note  Patient Details  Name: Elka Streif MRN: [id number] Date of Birth: July 18, 1941  Today's Date: 03/15/2019 SLP Individual Time: 1400-1455 SLP Individual Time Calculation (min): 55 min  Short Term Goals: Week 3: SLP Short Term Goal 1 (Week 3): Patient will demonstrate functional problem solving for basic tasks with Mod A verbal cues.  SLP Short Term Goal 2 (Week 3): Patient will demonstrate sustained attention to functional tasks for 15 minutes with Mod A verbal cues for redirection.  SLP Short Term Goal 3 (Week 3): Patient will recall new, daily information with Mod A multimodal cues.   Skilled Therapeutic Interventions: Skilled treatment session focused on cognitive goals. Upon arrival, patient was lethargic while upright in the wheelchair.  Patient was disoriented to place and situation and reported she was at the "horse's potootie." Clinician attempted to generate a visual aid but patient read Redge Gainer as "[place]."  Patient required constant cueing for arousal and Max multimodal cues to initiate and attend to basic tasks throughout session (straw stuck to lips and pulled out of cup without awareness or attempts to fix). Patient with increased language of confusion this session. PA and RN aware. Patient left upright in wheelchair with all needs within reach and alarm on. Continue with current plan of care.      Pain No/Denies Pain   Therapy/Group: Individual Therapy  Demetria Lightsey 03/15/2019, 3:58 PM

## 2019-03-15 NOTE — Plan of Care (Signed)
  Problem: RH BOWEL ELIMINATION Goal: RH STG MANAGE BOWEL W/MEDICATION W/ASSISTANCE Description STG Manage Bowel with Medication with Assistance. min  Outcome: Progressing   Problem: RH BLADDER ELIMINATION Goal: RH STG MANAGE BLADDER WITH ASSISTANCE Description STG Manage Bladder With Assistance Min  Outcome: Progressing   Problem: RH SAFETY Goal: RH STG ADHERE TO SAFETY PRECAUTIONS W/ASSISTANCE/DEVICE Description STG Adhere to Safety Precautions With Assistance/Device. Min  Outcome: Progressing   Problem: Consults Goal: RH STROKE PATIENT EDUCATION Description See Patient Education module for education specifics  Outcome: Progressing   

## 2019-03-15 NOTE — [place] (Signed)
Occupational Therapy Session Note  Patient Details  Name: Deetya Kautzer MRN: [id number] Date of Birth: 18-Dec-1940  Today's Date: 03/15/2019 OT Individual Time: 1105-1200 OT Individual Time Calculation (min): 55 min    Skilled Therapeutic Interventions/Progress Updates:    1:1 Engaged in sit to stand with three attempts before requesting a 2nd person to assist with  Standing in the STEDY. Pt with c/o pain in lower back . Sitting on perched seat, taken in to the bathroom to engage in washing face at sink with more than reasonable amt of time to attend to task and follow through with directions. Pt maintain a flexed posture and requiring max A to come into a more neurtral position due to not following through with command despite increase time and encouragement. Pt able to come off perched seat into standing with mod A to maintain while paddles are moved and pt seated into w/c. Pt unable to relax back into the chair- maintaining a forward position on the STEDY even while sitting in the w/c.- even after 5 min of waiting to sit back requiring A to sit back into chair. Went back into bathroom to engage in brushing teeth and putting in dentures with focus on attention to task and bilateral hand use with min to mod A. Pt not oriented to time, place or situation - attempted to use visual/ environmental cues outside window but didn't associate. Left sitting up in prep for lunch.   Therapy Documentation Precautions:  Precautions Precautions: Fall Precaution Comments: L hemi, back pain, needs time to initiate movements  Restrictions Weight Bearing Restrictions: No Pain:  ongoing c/o pain in lower back with movement - allowed for rest breaks as needed.    Therapy/Group: Individual Therapy  Roney Mans [facility] 03/15/2019, 4:26 PM

## 2019-03-15 NOTE — Plan of Care (Signed)
  Problem: RH BOWEL ELIMINATION Goal: RH STG MANAGE BOWEL W/MEDICATION W/ASSISTANCE Description STG Manage Bowel with Medication with Assistance. min  Outcome: Progressing   Problem: RH BLADDER ELIMINATION Goal: RH STG MANAGE BLADDER WITH ASSISTANCE Description STG Manage Bladder With Assistance Min  Outcome: Progressing   Problem: RH SAFETY Goal: RH STG ADHERE TO SAFETY PRECAUTIONS W/ASSISTANCE/DEVICE Description STG Adhere to Safety Precautions With Assistance/Device. Min  Outcome: Progressing   Problem: Consults Goal: RH STROKE PATIENT EDUCATION Description See Patient Education module for education specifics  Outcome: Progressing

## 2019-03-15 NOTE — [place] FL2 (Signed)
[place] MEDICAID FL2 LEVEL OF CARE SCREENING TOOL     IDENTIFICATION  Patient Name: Connie Oconnor Birthdate: Jul 20, 1941 Sex: female Admission Date (Current Location): 02/23/2019  [facility] and [place] Number:  Producer, television/film/video and Address:  [facility] [place], [address], [place], [place] [address]      Provider Number: [id number]  Attending Physician Name and Address:  Marcello Fennel, MD  Relative Name and Phone Number:  Riesa Pope [phone]  Arline Asp Light-daughter [phone]    Current Level of Care: Other (Comment)(rehab) Recommended Level of Care: Skilled Nursing Facility Prior Approval Number:    Date Approved/Denied:   PASRR Number: [id number]  Discharge Plan: SNF    Current Diagnoses: Patient Active Problem List   Diagnosis Date Noted  . Pulmonary emphysema (HCC)   . Pulmonary nodules   . Primary osteoarthritis of right hip   . Subacute confusional state   . Lethargy   . Confusion, postoperative   . Acute lower UTI   . Leukocytosis   . Slow transit constipation   . Pain of left calf   . Impulsiveness   . Dysphagia, post-stroke   . Labile blood pressure   . Hyperglycemia   . Hypotension   . Hypoalbuminemia due to protein-calorie malnutrition (HCC)   . Acute blood loss anemia   . Hypoglycemia   . Essential hypertension   . History of traumatic brain injury   . Lacunar infarct, acute (HCC) 02/23/2019  . 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 Hypothyroidism (acquired) 02/20/2019  . Closed fracture of medial portion of right tibial plateau 01/11/2018  . Acute medial meniscal tear, right, initial encounter 01/11/2018  . SAH (subarachnoid hemorrhage) (HCC) 03/29/2017    Orientation RESPIRATION BLADDER Height & Weight     Self, Situation, Place  Normal Incontinent(has to be cath at times-intermittently) Weight: 151 lb 0.2 oz (68.5  kg) Height:  5\' 5"  (165.1 cm)  BEHAVIORAL SYMPTOMS/MOOD NEUROLOGICAL BOWEL NUTRITION STATUS      Continent (Dys 3 thin liquids)  AMBULATORY STATUS COMMUNICATION OF NEEDS Skin   Extensive Assist Verbally Normal                       Personal Care Assistance Level of Assistance  Bathing, Dressing, Feeding Bathing Assistance: Limited assistance Feeding assistance: Limited assistance Dressing Assistance: Limited assistance     Functional Limitations Info  Speech Sight Info: Adequate   Speech Info: Impaired    SPECIAL CARE FACTORS FREQUENCY  PT (By licensed PT), OT (By licensed OT), Speech therapy, Bowel and bladder program     PT Frequency: 5x week OT Frequency: 5x week Bowel and Bladder Program Frequency: Timed toileting for bladder retraining   Speech Therapy Frequency: 5x week      Contractures Contractures Info: Not present    Additional Factors Info  Code Status, Allergies, Isolation Precautions Code Status Info: DNR Allergies Info: Aspirin, pencillins     Isolation Precautions Info: MRSA     Current Medications (03/15/2019):  This is the current hospital active medication list Current Facility-Administered Medications  Medication Dose Route Frequency Provider Last Rate Last Dose  . acetaminophen (TYLENOL) tablet 650 mg  650 mg Oral Q4H PRN Charlton Amor, PA-C   650 mg at 03/13/19 4462   Or  . acetaminophen (TYLENOL) solution 650 mg  650 mg Per Tube Q4H PRN Charlton Amor, PA-C  Or  . acetaminophen (TYLENOL) suppository 650 mg  650 mg Rectal Q4H PRN Angiulli, Mcarthur Rossettianiel J, PA-C      . amLODipine (NORVASC) tablet 5 mg  5 mg Oral Daily Kirsteins, Victorino SparrowAndrew E, MD   5 mg at 03/15/19 1103  . aspirin EC tablet 81 mg  81 mg Oral Daily Charlton Amorngiulli, Daniel J, PA-C   81 mg at 03/15/19 1102  . atorvastatin (LIPITOR) tablet 40 mg  40 mg Oral q1800 Charlton Amorngiulli, Daniel J, PA-C   40 mg at 03/14/19 1803  . clopidogrel (PLAVIX) tablet 75 mg  75 mg Oral Daily Charlton Amorngiulli,  Daniel J, PA-C   75 mg at 03/15/19 1103  . diclofenac sodium (VOLTAREN) 1 % transdermal gel 2 g  2 g Topical QID Marcello FennelPatel, Ankit Anil, MD      . feeding supplement (ENSURE ENLIVE) (ENSURE ENLIVE) liquid 237 mL  237 mL Oral TID BM Marcello FennelPatel, Ankit Anil, MD   237 mL at 03/15/19 1357  . iron polysaccharides (NIFEREX) capsule 150 mg  150 mg Oral Daily Ranelle OysterSwartz, Zachary T, MD   150 mg at 03/15/19 1103  . multivitamin with minerals tablet 1 tablet  1 tablet Oral Daily Marcello FennelPatel, Ankit Anil, MD   1 tablet at 03/15/19 1103  . Muscle Rub CREA   Topical PRN Marcello FennelPatel, Ankit Anil, MD      . nicotine (NICODERM CQ - dosed in mg/24 hours) patch 14 mg  14 mg Transdermal Daily Patel, Maryln GottronAnkit Anil, MD      . polyethylene glycol (MIRALAX / GLYCOLAX) packet 17 g  17 g Oral BID Marcello FennelPatel, Ankit Anil, MD   17 g at 03/15/19 1102  . senna-docusate (Senokot-S) tablet 2 tablet  2 tablet Oral QHS Marcello FennelPatel, Ankit Anil, MD   2 tablet at 03/14/19 2122     Discharge Medications: Please see discharge summary for a list of discharge medications.  Relevant Imaging Results:  Relevant Lab Results:   Additional Information SSN:[id number]  Lucy ChrisDupree, Mirabelle G, LCSW

## 2019-03-15 NOTE — Progress Notes (Signed)
Physical Therapy Session Note  Patient Details  Name: Connie Oconnor MRN: [id number] Date of Birth: 02-14-1941  Today's Date: 03/15/2019 PT Individual Time: 0935-1030 PT Individual Time Calculation (min): 55 min   Short Term Goals: Week 3:  PT Short Term Goal 1 (Week 3): pt will balance in sitting wiht feet supported, reaching within BOS , with supervision PT Short Term Goal 2 (Week 3): pt will initiate gait training PT Short Term Goal 3 (Week 3): pt will perform stand pivot transfers with mod-min assist consistently PT Short Term Goal 4 (Week 3): Patient will propel a hemi-hieght w/c 25' with use of up to 4 extremities.   Skilled Therapeutic Interventions/Progress Updates:    Patient in supine and assisted to don pants in supine with max to total A.  Pt. C/o pain in hips bilaterally today and increased time for all movement due to pain.  Rolling for pulling up pants with mod A and max cues greatly increased time.  Patient took several attempts to come to sitting due to pain finally giving max A for lifting trunk and moving legs off bed.  Sitting balance with S initially with UE support.  Bed to chair to L with mod A pt participating after increased time in squat/scoot pivot to L.  Patient assisted in TIS w/c to hallway outside treatment room.  Sit to stand with mirror for feedback on posture, alignment.  Again greatly increased time, multiple cues and encouragement to stand due to patient reporting severe pain in hips. Stood x 1 about 10 seconds with wall rail on R and mod A, but unable to extend hips/knees even after time.  Patient sit to squat x 1 mod A to remove towel under hips in chair.  Assisted in TIS back to room and left tilted with alarm belt on and call bell in reach.   Therapy Documentation Precautions:  Precautions Precautions: Fall Precaution Comments: L hemi, back pain, needs time to initiate movements  Restrictions Weight Bearing Restrictions: No Pain: Pain  Assessment Faces Pain Scale: Hurts whole lot Pain Type: Chronic pain Pain Location: Hip Pain Orientation: Right Pain Descriptors / Indicators: Sharp Pain Onset: With Activity Pain Intervention(s): Repositioned;Rest    Therapy/Group: Individual Therapy  Elray Mcgregor  [name],. PT 03/15/2019, 9:54 AM

## 2019-03-16 ENCOUNTER — Inpatient Hospital Stay (HOSPITAL_COMMUNITY): Payer: Medicare Other | Admitting: Speech Pathology

## 2019-03-16 ENCOUNTER — Inpatient Hospital Stay (HOSPITAL_COMMUNITY): Payer: Medicare Other

## 2019-03-16 DIAGNOSIS — Z720 Tobacco use: Secondary | ICD-10-CM

## 2019-03-16 DIAGNOSIS — E871 Hypo-osmolality and hyponatremia: Secondary | ICD-10-CM

## 2019-03-16 DIAGNOSIS — J9601 Acute respiratory failure with hypoxia: Secondary | ICD-10-CM

## 2019-03-16 LAB — COMPREHENSIVE METABOLIC PANEL
ALT: 99 U/L — ABNORMAL HIGH (ref 0–44)
AST: 62 U/L — ABNORMAL HIGH (ref 15–41)
Albumin: 1.6 g/dL — ABNORMAL LOW (ref 3.5–5.0)
Alkaline Phosphatase: 79 U/L (ref 38–126)
Anion gap: 8 (ref 5–15)
BUN: 23 mg/dL (ref 8–23)
CO2: 28 mmol/L (ref 22–32)
Calcium: 8.3 mg/dL — ABNORMAL LOW (ref 8.9–10.3)
Chloride: 96 mmol/L — ABNORMAL LOW (ref 98–111)
Creatinine, Ser: 0.65 mg/dL (ref 0.44–1.00)
GFR calc Af Amer: >60 mL/min (ref >60)
GFR calc non Af Amer: >60 mL/min (ref >60)
Glucose, Bld: 127 mg/dL — ABNORMAL HIGH (ref 70–99)
Potassium: 4.1 mmol/L (ref 3.5–5.1)
Sodium: 132 mmol/L — ABNORMAL LOW (ref 135–145)
Total Bilirubin: 0.5 mg/dL (ref 0.3–1.2)
Total Protein: 5.8 g/dL — ABNORMAL LOW (ref 6.5–8.1)

## 2019-03-16 LAB — AMMONIA: Ammonia: 12 umol/L (ref 9–35)

## 2019-03-16 MED ORDER — LOSARTAN POTASSIUM 25 MG PO TABS
12.5000 mg | ORAL_TABLET | Freq: Every day | ORAL | Status: DC
  Administered 2019-03-17 – 2019-03-19 (×3): 12.5 mg via ORAL
  Filled 2019-03-16 (×3): qty 0.5

## 2019-03-16 NOTE — Progress Notes (Signed)
Occupational Therapy Weekly Progress Note  Patient Details  Name: Connie Oconnor MRN: [id number] Date of Birth: 05-25-1941  Beginning of progress report period: Mar 09, 2019 End of progress report period: Mar 16, 2019   Patient has met 0 long term goals.  Short term goals not set due to estimated length of stay. Pt has made improvements in self feeding and in bed mobility. Pt remains limited by below deficits, the most limiting being her decreased initiation. Pt continues to participate in therapy to the best of her ability and OT will continue POC to decrease burden of care at next venue of care.   Patient continues to demonstrate the following deficits: muscle weakness, decreased cardiorespiratoy endurance, motor apraxia, decreased coordination and decreased motor planning, decreased attention to left and decreased motor planning, decreased initiation, decreased attention, decreased awareness, decreased problem solving, decreased safety awareness, decreased memory and delayed processing and decreased sitting balance, decreased standing balance, decreased postural control, hemiplegia and decreased balance strategies and therefore will continue to benefit from skilled OT intervention to enhance overall performance with BADL.  See Patient's Care Plan for progression toward long term goals.  Patient not progressing toward long term goals.  See goal revision..  Plan of care revisions: Several goals downgraded further to max A.     Therapy Documentation Precautions:  Precautions Precautions: Fall Precaution Comments: L hemi, back pain, needs time to initiate movements  Restrictions Weight Bearing Restrictions: No   Therapy/Group: Individual Therapy  Curtis Sites 03/16/2019, 12:09 PM

## 2019-03-16 NOTE — Progress Notes (Signed)
[facility] PHYSICAL MEDICINE & REHABILITATION PROGRESS NOTE   Subjective/Complaints: Patient seen sitting up in bed this morning working with therapies.  Discussed with SLP increased confusion yesterday.  Head CT ordered.  This morning, patient is more alert and oriented with increased time.  She has better contextual awareness.  Prolonged discussion with daughter yesterday updating events.  She notes improvement in hip pain with Voltaren gel.  ROS: Limited due to cognition, but appears to deny CP, shortness of breath, nausea, vomiting, diarrhea.  Objective:   Ct Head Wo Contrast  Result Date: 03/15/2019 CLINICAL DATA:  Altered level of consciousness today. EXAM: CT HEAD WITHOUT CONTRAST TECHNIQUE: Contiguous axial images were obtained from the base of the skull through the vertex without intravenous contrast. COMPARISON:  Brain MRI 02/20/2019.  Head CT 03/29/2017. FINDINGS: Brain: No evidence of acute infarction, hemorrhage, hydrocephalus, extra-axial collection or mass lesion/mass effect. Extensive chronic microvascular ischemic change noted. Vascular: No hyperdense vessel or unexpected calcification. Skull: Intact.  No focal lesion. Sinuses/Orbits: Negative. Other: None. IMPRESSION: No acute abnormality. Extensive chronic microvascular ischemic change. Electronically Signed   By: Drusilla Kanner M.D.   On: 03/15/2019 18:20   Dg Hip Unilat With Pelvis 2-3 Views Left  Result Date: 03/14/2019 CLINICAL DATA:  Left hip pain for 1 month EXAM: DG HIP (WITH OR WITHOUT PELVIS) 2-3V LEFT COMPARISON:  Chest FINDINGS: Moderate osteoarthritis changes in the hips bilaterally with joint space narrowing and spurring. SI joints symmetric and unremarkable. Postoperative changes in the lower lumbar spine. No acute bony abnormality. Specifically, no fracture, subluxation, or dislocation. 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 No acute bony abnormality. Electronically Signed   By: Charlett Nose  M.D.   On: 03/14/2019 15:44   Recent Labs    03/14/19 0537  WBC 11.0*  HGB 9.0*  HCT 27.2*  PLT 546*   Recent Labs    03/16/19 0238  NA 132*  K 4.1  CL 96*  CO2 28  GLUCOSE 127*  BUN 23  CREATININE 0.65  CALCIUM 8.3*    Intake/Output Summary (Last 24 hours) at 03/16/2019 1205 Last data filed at 03/16/2019 0900 Gross per 24 hour  Intake 1320 ml  Output -  Net 1320 ml     Physical Exam: Vital Signs Blood pressure (!) 127/57, pulse 78, temperature (!) 97.4 F (36.3 C), resp. rate 20, height  (1.651 m), weight 68.5 kg, SpO2 97 %. Constitutional: No distress . Vital signs reviewed. HENT: Normocephalic.  Atraumatic. Eyes: EOMI. No discharge. Cardiovascular: No JVD. Respiratory: Normal effort. GI: Non-distended. Musc: No edema or tenderness in extremities. Neurologic: Alert and and oriented x 3 with increased time Motor:  Left upper extremity: Shoulder abduction 3/5, distally 4+/5, stable Left lower extremity: 4+/5 proximal to distal, stable Right lower extremity: 4/5 hip flexion, knee extension, ankle dorsiflexion, (variable with participation) Skin: Warm and dry.  Intact.  Assessment/Plan: 1. Functional deficits secondary to right corona radiata infarct which require 3+ hours per day of interdisciplinary therapy in a comprehensive inpatient rehab setting.  Physiatrist is providing close team supervision and 24 hour management of active medical problems listed below.  Physiatrist and rehab team continue to assess barriers to discharge/monitor patient progress toward functional and medical goals  Care Tool:  Bathing  Bathing activity did not occur: Safety/medical concerns Body parts bathed by patient: Right arm, Left arm, Chest, Abdomen   Body parts bathed by helper: Right arm, Buttocks, Right lower leg, Left lower leg     Bathing  assist Assist Level: Moderate Assistance - Patient 50 - 74%     Upper Body Dressing/Undressing Upper body dressing   What  is the patient wearing?: Pull over shirt    Upper body assist Assist Level: Maximal Assistance - Patient 25 - 49%    Lower Body Dressing/Undressing Lower body dressing      What is the patient wearing?: Pants, Incontinence brief     Lower body assist Assist for lower body dressing: Maximal Assistance - Patient 25 - 49%     Toileting Toileting    Toileting assist Assist for toileting: Dependent - Patient 0%     Transfers Chair/bed transfer  Transfers assist     Chair/bed transfer assist level: Moderate Assistance - Patient 50 - 74%     Locomotion Ambulation   Ambulation assist   Ambulation activity did not occur: Safety/medical concerns(fatigue/pain )  Assist level: Moderate Assistance - Patient 50 - 74% Assistive device: Walker-rolling(and L hand splint) Max distance: 5'   Walk 10 feet activity   Assist  Walk 10 feet activity did not occur: Safety/medical concerns(fatigue/pain )        Walk 50 feet activity   Assist Walk 50 feet with 2 turns activity did not occur: Safety/medical concerns(fatigue/pain )         Walk 150 feet activity   Assist Walk 150 feet activity did not occur: Safety/medical concerns(fatigue/pain )         Walk 10 feet on uneven surface  activity   Assist Walk 10 feet on uneven surfaces activity did not occur: Safety/medical concerns(fatigue/pain )         Wheelchair     Assist Will patient use wheelchair at discharge?: Yes Type of Wheelchair: Manual    Wheelchair assist level: Supervision/Verbal cueing Max wheelchair distance: 15'    Wheelchair 50 feet with 2 turns activity    Assist    Wheelchair 50 feet with 2 turns activity did not occur: Safety/medical concerns(fatigue/pain )       Wheelchair 150 feet activity     Assist Wheelchair 150 feet activity did not occur: Safety/medical concerns(fatigue/pain )        Medical Problem List and Plan: 1.Left side weaknesssecondary to  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  Cont CIR Plan outpatient coiling of aneurysm per interventional radiology  Will consider anti-spasticity medications if necessary, not appropriate at this point  Prolonged discussion with daughter yesterday, discussing current medical status and functional needs as well as addressing all questions, including medication side effect profiles.  CT head reviewed, unremarkable for acute intracranial process, but showing signs of microvascular changes 2. Antithrombotics: -DVT/anticoagulation:SCDs.  Lower extremity Dopplers negative DVT -antiplatelet therapy: aspirin 81 mg daily Plavix 75 mg daily 3 weeks and Plavix alone 3. Pain Management/migraine headaches:Tramadol and Flexeril DC'd  Added kpad for low back  Muscle rub ordered for left calf and right thigh- improved 4. Mood:Provide emotional support -antipsychotic agents: N/A 5. Neuropsych/vascular dementia: This patientis not capable of making decisions on herown behalf.  Continue tele-sitter for safety  Needs significant encouragement 6. Skin/Wound Care:Routine skin checks 7. Fluids/Electrolytes/Nutrition:Routine in and outs  D3 thins, advance diet as tolerated 8. History of traumatic [facility] May 2018. Patient seen in the past by Dr. Conchita Paris 9. Hypertension.  Vitals:   03/15/19 2034 03/16/19 0556  BP: (!) 128/51 (!) 127/57  Pulse: 89 78  Resp: (!) 21 20  Temp: 98 F (36.7 C) (!) 97.4 F (36.3 C)  SpO2: 95% 97%   Norvasc 10 mg daily, decreased to 5 mg on 4/26, DC'd on 5/14 due to daughter's concern of side effects  Losartan 12.5 daily started on 5/16 (daughter states she personally is able to tolerate this)  Relatively controlled on 5/15 10. Hyperlipidemia. Lipitor 11. Tobacco abuse. Counseling 12.   Hyperglycemia  Hemoglobin A1c 5.5 on 01/2019  P.o. intake remains inconsistent on 5/14  Patient needs significant time and cueing to encourage intake. 13.  Acute blood loss anemia  Hemoccult ordered and positive, repeat negative, positive again on 5/11  Labs ordered for Monday  Hgb 9.0 on 5/13  Added Fe++ supp 14.  Hypoalbuminemia  Supplement initiated on 4/27 15.  Slow transit constipation  Bowel regimen increased on 5/3, increased again on 5/4  Improving 16.  Confusion-multifactorial  Vascular dementia  WBCs 11.0 on 5/13, labs ordered for tomorrow  Labs ordered for Monday  Urine culture with MRSA  Completed Macrobid started on 5/6-5/13  Repeat UA relatively unremarkable, repeat urine culture no growth  CT chest reviewed showing emphysema with scattered pulmonary nodules with outpatient follow-up scan.  Ammonia within normal limits  Lipitor, amlodipine DC'd per daughter request [id number].  Hip OA  Bilaterally noted on x-ray, personally reviewed.  Voltaren gel ordered with improvement 18.  Emphysema with scattered pulmonary nodules  Repeat chest CT as outpatient  Noted on CT  ABG reviewed showing decreased PO2  Supplemental oxygen ordered 19.  Transaminitis  LFTs elevated on 5/15  Lipitor DC'd  Labs ordered for Monday 20.  Hyponatremia  Sodium 132 on 5/15  Continue to monitor 21.  Tobacco abuse  Patient heavy smoker at baseline  Nicotine patch ordered on 5/14     LOS: 21 days A FACE TO FACE EVALUATION WAS PERFORMED  Ankit Karis Jubanil Patel 03/16/2019, 12:05 PM

## 2019-03-16 NOTE — Plan of Care (Signed)
  Problem: RH BOWEL ELIMINATION Goal: RH STG MANAGE BOWEL W/MEDICATION W/ASSISTANCE Description STG Manage Bowel with Medication with Assistance. min  Outcome: Progressing   Problem: RH BLADDER ELIMINATION Goal: RH STG MANAGE BLADDER WITH ASSISTANCE Description STG Manage Bladder With Assistance Min  Outcome: Progressing   Problem: RH SAFETY Goal: RH STG ADHERE TO SAFETY PRECAUTIONS W/ASSISTANCE/DEVICE Description STG Adhere to Safety Precautions With Assistance/Device. Min  Outcome: Progressing   Problem: Consults Goal: RH STROKE PATIENT EDUCATION Description See Patient Education module for education specifics  Outcome: Progressing   

## 2019-03-16 NOTE — Progress Notes (Signed)
Occupational Therapy Session Note  Patient Details  Name: Connie Oconnor MRN: [id number] Date of Birth: Sep 28, 1941  Today's Date: 03/16/2019 OT Individual Time: 0160-1093 OT Individual Time Calculation (min): 69 min    Short Term Goals: Week 3:  OT Short Term Goal 1 (Week 3): STG=LTG 2/2 ELOS  Skilled Therapeutic Interventions/Progress Updates:    Pt received supine with c/o pain in B hips and back. Pt able to use external cues and report correct orientation to place and date. Pt set up to eat breakfast at bed level and most upright posture possible positioned. With manual facilitation for set up, pt able to hold magic cup in L hand and scoop with R hand and bring to mouth. Cueing required for bite size/sip management and pacing. Pt had one instance of holding liquid in her mouth while using a straw and expelled all liquid out. Pt used fork to spear several pieces of fruit. Increased time provided for pt to problem solve through navigating meal, as well as more than reasonable time required for pt to initiate all movement. Pt then reported BM in brief. Pt rolled R with min A and required total A for hygiene and brief management. NT entered room to assist with changing bed linens and to make pt more comfortable d/t pain in B hips/groin. Pt was left with NT finishing care and all needs met.   Therapy Documentation Precautions:  Precautions Precautions: Fall Precaution Comments: L hemi, back pain, needs time to initiate movements  Restrictions Weight Bearing Restrictions: No   Therapy/Group: Individual Therapy  Curtis Sites 03/16/2019, 8:37 AM

## 2019-03-16 NOTE — Progress Notes (Signed)
Physical Therapy Session Note  Patient Details  Name: Connie Oconnor MRN: [id number] Date of Birth: [birth year]  Today's Date: 03/16/2019 PT Individual Time: 1300-1358 PT Individual Time Calculation (min): 58 min   Short Term Goals: Week 2:  PT Short Term Goal 1 (Week 3): pt will balance in sitting wiht feet supported, reaching within BOS , with supervision PT Short Term Goal 2 (Week 3): pt will initiate gait training PT Short Term Goal 3 (Week 3): pt will perform stand pivot transfers with mod-min assist consistently PT Short Term Goal 4 (Week 3): Patient will propel a hemi-hieght w/c 25' with use of up to 4 extremities.   Skilled Therapeutic Interventions/Progress Updates:    Patient in supine reports just talked with family and able to use video to talk with them in the evenings.  Performed supine SAQ x 10 each leg with min cues for focus/attention.  Patient rolling to R and side to sit mod A increased time and c/o pain L leg especially.  Patient seated EOB with min A.  Asking about her dentures.  Pivot to L to w/c max A cues for leaning to R and pt needing lifting and lowering assistance.  In w/c reaching at sink for her denture cup and opened with min A.  Assisted to rinse them and pt placed in her mouth after rinsing mouth at sink.  Patient assisted in w/c to bathroom and used mirror to comb hair on both sides with S.  Patient assisted to hallway railing with mirror for feedback on posture, alignment, performed sit to partial stand x 3 with max cues and mod A, but unable to come fully to standing despite assistance and cues for anterior weight shift and trunk elevation.  Patient assisted in w/c to room and set up for lunch tray with handoff to NT for feeding.  Left with alarm belt on and activated.   Therapy Documentation Precautions:  Precautions Precautions: Fall Precaution Comments: L hemi, back pain, needs time to initiate movements  Restrictions Weight Bearing Restrictions:  No Pain: Pain Assessment Faces Pain Scale: Hurts even more Pain Type: Chronic pain Pain Location: Leg Pain Orientation: Right;Left Pain Descriptors / Indicators: Aching Pain Onset: On-going Pain Intervention(s): Repositioned;Distraction    Therapy/Group: Individual Therapy  Elray Mcgregor  [name], PT 03/16/2019, 4:46 PM

## 2019-03-16 NOTE — Progress Notes (Signed)
Speech Language Pathology Weekly Progress and Session Note  Patient Details  Name: Connie Oconnor MRN: [id number] Date of Birth: 1941-10-04  Beginning of progress report period: Mar 09, 2019 End of progress report period: Mar 16, 2019  Today's Date: 03/16/2019 SLP Individual Time: 1030-1110 SLP Individual Time Calculation (min): 40 min  Short Term Goals: Week 3: SLP Short Term Goal 1 (Week 3): Patient will demonstrate functional problem solving for basic tasks with Mod A verbal cues.  SLP Short Term Goal 1 - Progress (Week 3): Not met SLP Short Term Goal 2 (Week 3): Patient will demonstrate sustained attention to functional tasks for 15 minutes with Mod A verbal cues for redirection.  SLP Short Term Goal 2 - Progress (Week 3): Not met SLP Short Term Goal 3 (Week 3): Patient will recall new, daily information with Mod A multimodal cues.  SLP Short Term Goal 3 - Progress (Week 3): Not met    New Short Term Goals: Week 4: SLP Short Term Goal 1 (Week 4): Patient will orient to place, situation and month with Mod A multimodal cues.  SLP Short Term Goal 2 (Week 4): Patient will demonstrate sustained attention to tasks for ~5 minutes with Mod A verbal cues for redirection.  SLP Short Term Goal 3 (Week 4): Patient will initiate tasks with Mod A verbal cues.  SLP Short Term Goal 4 (Week 4): Patient will demonstrate functional problem solving with Max A mulitmodal cues.   Weekly Progress Updates: Patient continues to make minimal progress and has not met any STGs this reporting period. Patient's overall cognitive function and alertness continue to be inconsistent but patient requires overall Max-Total A for sustained attention, functional problem solving, initiation and recall of daily information like orientation to time, place and situation. Patient also continues to demonstrate language of confusion. Patient is consuming Dys. 3 textures with thin liquids with minimal overt s/s of aspiration with  full supervision needed for encouragement, attention and problem solving with self-feeding. Patient and family education ongoing. Patient's family is unable to provide the necessary physical and cognitive assistance at this time, therefore, patient's d/c plan has changed to SNF. Patient would benefit from continued skilled SLP intervention to maximize her cognitive and swallowing function prior to discharge.      Intensity: Minumum of 1-2 x/day, 30 to 90 minutes Frequency: 3 to 5 out of 7 days Duration/Length of Stay: TBD due to possible SNF placement  Treatment/Interventions: Cognitive remediation/compensation;Environmental controls;Internal/external aids;Speech/Language facilitation;Therapeutic Activities;Patient/family education;Functional tasks;Cueing hierarchy;Dysphagia/aspiration precaution training   Daily Session  Skilled Therapeutic Interventions: Skilled treatment session focused on cognitive goals. Upon arrival, patient was awake and alert. Patient appeared brighter this session and was independently oriented to place, time and situation. Patient participated in a functional conversation that focused on current deficits and required Max-Total A multimodal cues for anticipatory awareness for d/c planning. Patient reporting she feels she can still live by herself but also reported she "probably shouldn't have been living alone" prior to stroke. Patient received a phone call and required Max A verbal cues for problem solving in regards to using the telephone. Patient left upright in bed with alarm on and all needs within reach. Continue with current plan of care.      Pain No/Denies Pain  Therapy/Group: Individual Therapy  Alexie Lanni 03/16/2019, 6:40 AM

## 2019-03-17 ENCOUNTER — Inpatient Hospital Stay (HOSPITAL_COMMUNITY): Payer: Medicare Other

## 2019-03-17 DIAGNOSIS — J9601 Acute respiratory failure with hypoxia: Secondary | ICD-10-CM

## 2019-03-17 DIAGNOSIS — E871 Hypo-osmolality and hyponatremia: Secondary | ICD-10-CM

## 2019-03-17 LAB — CBC WITH DIFFERENTIAL/PLATELET
Abs Immature Granulocytes: 0.06 10*3/uL (ref 0.00–0.07)
Basophils Absolute: 0 10*3/uL (ref 0.0–0.1)
Basophils Relative: 0 %
Eosinophils Absolute: 0.1 10*3/uL (ref 0.0–0.5)
Eosinophils Relative: 1 %
HCT: 24.4 % — ABNORMAL LOW (ref 36.0–46.0)
Hemoglobin: 8 g/dL — ABNORMAL LOW (ref 12.0–15.0)
Immature Granulocytes: 1 %
Lymphocytes Relative: 5 %
Lymphs Abs: 0.6 10*3/uL — ABNORMAL LOW (ref 0.7–4.0)
MCH: 27 pg (ref 26.0–34.0)
MCHC: 32.8 g/dL (ref 30.0–36.0)
MCV: 82.4 fL (ref 80.0–100.0)
Monocytes Absolute: 0.9 10*3/uL (ref 0.1–1.0)
Monocytes Relative: 7 %
Neutro Abs: 10.1 10*3/uL — ABNORMAL HIGH (ref 1.7–7.7)
Neutrophils Relative %: 86 %
Platelets: 516 10*3/uL — ABNORMAL HIGH (ref 150–400)
RBC: 2.96 MIL/uL — ABNORMAL LOW (ref 3.87–5.11)
RDW: 13.8 % (ref 11.5–15.5)
WBC: 11.8 10*3/uL — ABNORMAL HIGH (ref 4.0–10.5)
nRBC: 0 % (ref 0.0–0.2)

## 2019-03-17 NOTE — Progress Notes (Signed)
Connie MarusRebecca Chiles is a 78 y.o. female [birth year] [id number]  Subjective: No new complaints. No new problems. Slept well. Feeling OK.  Objective: Vital signs in last 24 hours: Temp:  [98 F (36.7 C)-98.8 F (37.1 C)] 98.3 F (36.8 C) (05/16 0707) Pulse Rate:  [75-78] 75 (05/16 0707) Resp:  [18-20] 18 (05/16 0707) BP: (129-140)/(50-53) 140/53 (05/16 0707) SpO2:  [95 %-99 %] 95 % (05/16 0707) Weight change:  Last BM Date: 03/16/19  Intake/Output from previous day: 05/15 0701 - 05/16 0700 In: 480 [P.O.:480] Out: 1 [Urine:1] Last cbgs: CBG (last 3)  No results for input(s): GLUCAP in the last 72 hours.   Physical Exam General: No apparent distress   HEENT: not dry Lungs: Normal effort. Lungs clear to auscultation, no crackles or wheezes. Cardiovascular: Regular rate and rhythm, no edema Abdomen: S/NT/ND; BS(+) Musculoskeletal:  unchanged Neurological: No new neurological deficits Wounds: N/A    Skin: clear  Aging changes Mental state: Alert,  Cooperative. Flat affect    Lab Results: BMET    Component Value Date/Time   NA 132 (L) 03/16/2019 0238   K 4.1 03/16/2019 0238   CL 96 (L) 03/16/2019 0238   CO2 28 03/16/2019 0238   GLUCOSE 127 (H) 03/16/2019 0238   BUN 23 03/16/2019 0238   CREATININE 0.65 03/16/2019 0238   CALCIUM 8.3 (L) 03/16/2019 0238   GFRNONAA >60 03/16/2019 0238   GFRAA >60 03/16/2019 0238   CBC    Component Value Date/Time   WBC 11.8 (H) 03/17/2019 0558   RBC 2.96 (L) 03/17/2019 0558   HGB 8.0 (L) 03/17/2019 0558   HCT 24.4 (L) 03/17/2019 0558   PLT 516 (H) 03/17/2019 0558   MCV 82.4 03/17/2019 0558   MCH 27.0 03/17/2019 0558   MCHC 32.8 03/17/2019 0558   RDW 13.8 03/17/2019 0558   LYMPHSABS 0.6 (L) 03/17/2019 0558   MONOABS 0.9 03/17/2019 0558   EOSABS 0.1 03/17/2019 0558   BASOSABS 0.0 03/17/2019 0558    Studies/Results: Ct Head Wo Contrast  Result Date: 03/15/2019 CLINICAL DATA:  Altered level of consciousness today. EXAM: CT HEAD  WITHOUT CONTRAST TECHNIQUE: Contiguous axial images were obtained from the base of the skull through the vertex without intravenous contrast. COMPARISON:  Brain MRI 02/20/2019.  Head CT 03/29/2017. FINDINGS: Brain: No evidence of acute infarction, hemorrhage, hydrocephalus, extra-axial collection or mass lesion/mass effect. Extensive chronic microvascular ischemic change noted. Vascular: No hyperdense vessel or unexpected calcification. Skull: Intact.  No focal lesion. Sinuses/Orbits: Negative. Other: None. IMPRESSION: No acute abnormality. Extensive chronic microvascular ischemic change. Electronically Signed   By: Drusilla Kannerhomas  Dalessio M.D.   On: 03/15/2019 18:20    Medications: I have reviewed the patient's current medications.  Assessment/Plan:  1.  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  Continue CIR.  Outpatient follow-up with an interventional radiology 2.  DVT prophylaxis with SCDs.  Antiplatelet therapy with aspirin and Plavix.  Discontinue aspirin in 3 months. 3.  Pain control with tramadol and Tylenol PRN 4.  Emotional support 5.  Routine skin care 6.  Vascular dementia.  The patient is not capable of making decisions on her own behalf 7.  History of traumatic subdural hematoma in 2018 8.  Hypertension.  She is on Norvasc 9.  Hyperglycemia.  On diet 10.  Acute blood loss anemia.  Monitor hemoglobin 11.  Slow transit constipation.  On bowel regimen 12.  Hypoalbuminemia.  On supplements 13.  Confusion-multifactorial.  The patient has underlying vascular dementia 14.  Emphysema with scattered pulmonary nodules.  Repeat chest CT as an outpatient 15.  Elevated liver tests.  Will continue to monitor liver tests 16.  Hyponatremia.  Continue to monitor sodium/potassium 17.  Hip osteoarthritis.  On Voltaren gel 18.  Tobacco abuse.  Heavy  smoker at baseline.  On nicotine patch                    Length of stay, days: 22  Sonda Primes , MD 03/17/2019, 2:16 PM

## 2019-03-17 NOTE — Progress Notes (Signed)
Physical Therapy Session Note  Patient Details  Name: Connie Oconnor MRN: [id number] Date of Birth: Jan 04, 1941  Today's Date: 03/17/2019 PT Individual Time: 1300-1345 PT Individual Time Calculation (min): 45 min   Short Term Goals: Week 3:  PT Short Term Goal 1 (Week 3): pt will balance in sitting wiht feet supported, reaching within BOS , with supervision PT Short Term Goal 2 (Week 3): pt will initiate gait training PT Short Term Goal 3 (Week 3): pt will perform stand pivot transfers with mod-min assist consistently PT Short Term Goal 4 (Week 3): Patient will propel a hemi-hieght w/c 25' with use of up to 4 extremities.   Skilled Therapeutic Interventions/Progress Updates:    Patient in TIS w/c in room asleep upon entry.  Aroused slowly and pt used wash cloth on face with cueing to assist to awaken.  Assisted in w/c to Nu Step in hallway.  Attempted to engage pt in activity and transfer to Nu Step seat to work joints and loosen tightness/painful back and hips.  Patient easily distracted and not interested in transfer despite several cues/encouragement.  Participated in forward leans AAROM in chair with feet on floor, then AAROM hip flexion and LAQ AROM on R, AAROM on L.  Patient requesting back to bed and despite lunch waiting for her continued to request back to bed due to fatigue and wished to eat later ain afternoon.  Patient then requesting to toilet.  Attempted +2 stand to [facility] to get to 3:1, but pt resistant with c/o pain in hips/back.  So pivot to bed with +2 A to L and sit to supine +2 A.  Patient rolled with mod A to place bedpan.  Left in supine on bed pain with NT in room.   Therapy Documentation Precautions:  Precautions Precautions: Fall Precaution Comments: L hemi, back pain, needs time to initiate movements  Restrictions Weight Bearing Restrictions: No Pain: Pain Assessment Faces Pain Scale: Hurts even more Pain Type: Chronic pain Pain Location: Hip Pain Orientation:  Right;Left Pain Onset: On-going Pain Intervention(s): Repositioned    Therapy/Group: Individual Therapy  Elray Mcgregor  [name], PT 03/17/2019, 4:49 PM

## 2019-03-18 LAB — OCCULT BLOOD X 1 CARD TO LAB, STOOL: Fecal Occult Bld: NEGATIVE

## 2019-03-18 NOTE — Progress Notes (Signed)
Connie Oconnor is a 78 y.o. female [birth year] [id number]  Subjective: No new complaints. No new problems. Slept well. Feeling OK.  Objective: Vital signs in last 24 hours: Temp:  [98 F (36.7 C)-98.7 F (37.1 C)] 98 F (36.7 C) (05/17 0537) Pulse Rate:  [73-83] 73 (05/17 0537) Resp:  [16-20] 16 (05/17 0537) BP: (113-141)/(43-55) 113/48 (05/17 0537) SpO2:  [96 %-100 %] 96 % (05/17 0537) Weight change:  Last BM Date: 03/18/19  Intake/Output from previous day: 05/16 0701 - 05/17 0700 In: 240 [P.O.:240] Out: 800 [Urine:800] Last cbgs: CBG (last 3)  No results for input(s): GLUCAP in the last 72 hours.   Physical Exam General: No apparent distress   HEENT: not dry Lungs: Normal effort. Lungs clear to auscultation, no crackles or wheezes. Cardiovascular: Regular rate and rhythm, no edema Abdomen: S/NT/ND; BS(+) Musculoskeletal:  unchanged Neurological: No new neurological deficits Wounds: N/A    Skin: clear  Aging changes Mental state: Alert, cooperative    Lab Results: BMET    Component Value Date/Time   NA 132 (L) 03/16/2019 0238   K 4.1 03/16/2019 0238   CL 96 (L) 03/16/2019 0238   CO2 28 03/16/2019 0238   GLUCOSE 127 (H) 03/16/2019 0238   BUN 23 03/16/2019 0238   CREATININE 0.65 03/16/2019 0238   CALCIUM 8.3 (L) 03/16/2019 0238   GFRNONAA >60 03/16/2019 0238   GFRAA >60 03/16/2019 0238   CBC    Component Value Date/Time   WBC 11.8 (H) 03/17/2019 0558   RBC 2.96 (L) 03/17/2019 0558   HGB 8.0 (L) 03/17/2019 0558   HCT 24.4 (L) 03/17/2019 0558   PLT 516 (H) 03/17/2019 0558   MCV 82.4 03/17/2019 0558   MCH 27.0 03/17/2019 0558   MCHC 32.8 03/17/2019 0558   RDW 13.8 03/17/2019 0558   LYMPHSABS 0.6 (L) 03/17/2019 0558   MONOABS 0.9 03/17/2019 0558   EOSABS 0.1 03/17/2019 0558   BASOSABS 0.0 03/17/2019 0558    Studies/Results: No results found.  Medications: I have reviewed the patient's current medications.  Assessment/Plan:  1.  Left  hemiparesis secondary to right corona radiata infarction with dysarthria.  The patient is having 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  Continue CIR.  Outpatient follow-up with Interventional radiology 2.  DVT prophylaxis with SCDs.  Antiplatelet therapy with aspirin and Plavix.  Discontinue aspirin 3 months 3.  Pain control with tramadol and Tylenol PRN 4.  Emotional support 5.  Routine skin care 6.  Vascular dementia.  Patient is not capable of making decisions on her own behalf 7.  History of traumatic subdural hematoma in 2018 8.  Hypertension.  She is on Norvasc 9.  Hyper glycemia.  On diet 10.  Acute blood loss anemia.  Monitor hemoglobin 11.  Slow transit constipation.  On bowel regimen 12.  Hypoalbuminemia.  On supplements 13.  Confusion-multifactorial.  The patient has underlying vascular dementia. 14.  Emphysema with scattered pulmonary nodules.  Repeat chest CT as an outpatient 15.  Elevated liver enzymes.  We will continue to monitor liver enzymes 16.  Hyponatremia.  Continue to monitor sodium/potassium 17.  He points to arthritis.  On Voltaren gel 18.  Tobacco abuse.  The patient was a heavy smoker at baseline.  On nicotine patch       Length of stay, days: 23  Sonda Primes , MD 03/18/2019, 2:19 PM

## 2019-03-18 NOTE — Progress Notes (Signed)
Patient had several loose stools today, which are black in color (O.B. negative ) and becoming more and more loose.  Suggest holding ordered laxatives until this symptom subsides.  She has had very little appetite lately and requires much encouragement to eat.

## 2019-03-19 ENCOUNTER — Inpatient Hospital Stay (HOSPITAL_COMMUNITY): Payer: Medicare Other

## 2019-03-19 ENCOUNTER — Inpatient Hospital Stay (HOSPITAL_COMMUNITY): Payer: Medicare Other | Admitting: Speech Pathology

## 2019-03-19 DIAGNOSIS — K922 Gastrointestinal hemorrhage, unspecified: Secondary | ICD-10-CM

## 2019-03-19 LAB — CBC WITH DIFFERENTIAL/PLATELET
Abs Immature Granulocytes: 0.09 10*3/uL — ABNORMAL HIGH (ref 0.00–0.07)
Basophils Absolute: 0 10*3/uL (ref 0.0–0.1)
Basophils Relative: 0 %
Eosinophils Absolute: 0 10*3/uL (ref 0.0–0.5)
Eosinophils Relative: 0 %
HCT: 25.2 % — ABNORMAL LOW (ref 36.0–46.0)
Hemoglobin: 8.2 g/dL — ABNORMAL LOW (ref 12.0–15.0)
Immature Granulocytes: 1 %
Lymphocytes Relative: 6 %
Lymphs Abs: 0.8 10*3/uL (ref 0.7–4.0)
MCH: 26.9 pg (ref 26.0–34.0)
MCHC: 32.5 g/dL (ref 30.0–36.0)
MCV: 82.6 fL (ref 80.0–100.0)
Monocytes Absolute: 1.1 10*3/uL — ABNORMAL HIGH (ref 0.1–1.0)
Monocytes Relative: 8 %
Neutro Abs: 11.7 10*3/uL — ABNORMAL HIGH (ref 1.7–7.7)
Neutrophils Relative %: 85 %
Platelets: 618 10*3/uL — ABNORMAL HIGH (ref 150–400)
RBC: 3.05 MIL/uL — ABNORMAL LOW (ref 3.87–5.11)
RDW: 13.9 % (ref 11.5–15.5)
WBC: 13.8 10*3/uL — ABNORMAL HIGH (ref 4.0–10.5)
nRBC: 0 % (ref 0.0–0.2)

## 2019-03-19 LAB — COMPREHENSIVE METABOLIC PANEL
ALT: 75 U/L — ABNORMAL HIGH (ref 0–44)
AST: 44 U/L — ABNORMAL HIGH (ref 15–41)
Albumin: 1.4 g/dL — ABNORMAL LOW (ref 3.5–5.0)
Alkaline Phosphatase: 71 U/L (ref 38–126)
Anion gap: 13 (ref 5–15)
BUN: 17 mg/dL (ref 8–23)
CO2: 24 mmol/L (ref 22–32)
Calcium: 8.3 mg/dL — ABNORMAL LOW (ref 8.9–10.3)
Chloride: 93 mmol/L — ABNORMAL LOW (ref 98–111)
Creatinine, Ser: 0.63 mg/dL (ref 0.44–1.00)
GFR calc Af Amer: >60 mL/min (ref >60)
GFR calc non Af Amer: >60 mL/min (ref >60)
Glucose, Bld: 129 mg/dL — ABNORMAL HIGH (ref 70–99)
Potassium: 4 mmol/L (ref 3.5–5.1)
Sodium: 130 mmol/L — ABNORMAL LOW (ref 135–145)
Total Bilirubin: 0.4 mg/dL (ref 0.3–1.2)
Total Protein: 5.9 g/dL — ABNORMAL LOW (ref 6.5–8.1)

## 2019-03-19 MED ORDER — LOSARTAN POTASSIUM 25 MG PO TABS
25.0000 mg | ORAL_TABLET | Freq: Every day | ORAL | Status: DC
  Administered 2019-03-20 – 2019-03-27 (×8): 25 mg via ORAL
  Filled 2019-03-19 (×8): qty 1

## 2019-03-19 MED ORDER — PANTOPRAZOLE SODIUM 40 MG PO TBEC
40.0000 mg | DELAYED_RELEASE_TABLET | Freq: Every day | ORAL | Status: DC
  Administered 2019-03-19 – 2019-03-21 (×3): 40 mg via ORAL
  Filled 2019-03-19 (×3): qty 1

## 2019-03-19 NOTE — H&P (View-Only) (Signed)
Referring Provider:    Maryla Morrow, MD      Primary Care Physician:  Heide Scales, PA-C Primary Gastroenterologist:  Previously Dr. Jarold Motto           Reason for Consultation:   Anemia , heme + stool         ASSESSMENT / PLAN:    1. 78 yo female with new [place] anemia / heme positive stools on ASA + Plavix. Baseline hgb ~14 with slow drift this admission to 8.2. Reports "tarry" stool over weekend but oral iron started a week ago and no corresponding drop in hgb over weekend.  -Reports recent N/V, ? Brown colored emesis but poor historian. CTAP w/ contrast for N/V on 03/16/19 was unrevealing. Will probably need EGD (on plavix) at some point. She is > 10 years out from last colonoscopy but with recent CVA and inability to hold Plavix this can probably be deferred to outpatient setting when medically stable.  -NPO after MN for probable EGD tomorrow.   2. 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 Has left sided weakness. On Plavix + ASA  3. Chronic constipation. Currently doing well on Senna and MIralax  4. Mildly abnormal liver tests (new and transient).  Yesterday AST in 60s / ALT 99 and improved overnight.  -monitor for now.  -steatosis on CTAP w/ contrast 03/16/19.    HPI:   Connie Oconnor is a 78 y.o. female with hypothyroidism, SAH from a fall and tobacco abuse admitted late April with CVA. She didn't receive TPA. Cerebral arteriogram showed a right MCA aneurysm. She was started on a daily baby aspirin + plavix for 3 weeks followed then by just Plavix. Plan was for eventual outpatient coiling of the aneurysm by IR.   Patient started on iron one week ago. Over weekend there were reports of had tarry , formed stools. She reports chronic constipation but BMs moving okay not on Senna and Miralax. No abdominal pain. She has had some N/V earlier in the admission. No hematemesis but she thinks emesis may have been brown in color. At home prior to admission she wasn't  taking any NSAIDs.    GI studies:  Colonoscopy 2009 for diarrhea. Complete exam with excellent prep. Normal test. Biopsies for evaluation of diarrhea - results not in EPIC  PERTINENT LABS:   Hgb 02/22/19    14 Hgb 03/05/19      11.2 Hgb 03/10/19        9.0 Hgb 03/17/19     8.0 Hgb 03/20/19      8.2   Ferritin 357, TIBC 217, 6% sat   MOST RECENT GI STUDIES:    Past Medical History:  Diagnosis Date  . Anemia    hx of  . Arthritis   . Dislocation of right knee with medial meniscus tear   . Headache    hx of migraines none since 1990's  . Hypothyroidism    1978, no problems since  . Pneumonia 1990's none since  . PVC (premature ventricular contraction)    199'0's none since  . SAH (subarachnoid hemorrhage) (HCC) 2016   On the right    Past Surgical History:  Procedure Laterality Date  . BACK SURGERY    . CERVICAL LAMINECTOMY  1986  . DILATION AND CURETTAGE OF UTERUS  1970  . ELBOW SURGERY Right 1993  . IR ANGIO INTRA EXTRACRAN SEL COM CAROTID INNOMINATE BILAT MOD SED  02/22/2019  . IR ANGIO VERTEBRAL  SEL SUBCLAVIAN INNOMINATE UNI L MOD SED  02/22/2019  . IR ANGIO VERTEBRAL SEL VERTEBRAL UNI R MOD SED  02/22/2019  . KNEE ARTHROSCOPY WITH SUBCHONDROPLASTY Right 01/11/2018   Procedure: KNEE ARTHROSCOPY WITH SUBCHONDROPLASTY, PARTIAL MEDIAL MENISCECTOMY, CHONDROPLASTY OF THE MEDIAL PATELLA FEMORAL CHONDYL, MEDIAL PLICA  EXCISION;  Surgeon: Jodi Geralds, MD;  Location: [facility];  Service: Orthopedics;  Laterality: Right;  . LUMBAR LAMINECTOMY  1993   rods placed bone generated removed one year later   . MULTIPLE TOOTH EXTRACTIONS    . SHOULDER SURGERY Left    arthritis clean out and shortened collar bone dr Renae Fickle  . TONSILLECTOMY  1970  . VAGINAL HYSTERECTOMY  1971   partial dr Dewaine Conger  . WISDOM TOOTH EXTRACTION      Prior to Admission medications   Medication Sig Start Date End Date Taking? Authorizing Provider  amLODipine (NORVASC) 10 MG tablet Take 1 tablet (10 mg total) by  mouth daily. 02/24/19   Azucena Fallen, MD  atorvastatin (LIPITOR) 40 MG tablet Take 1 tablet (40 mg total) by mouth daily at 6 PM. 02/23/19   Azucena Fallen, MD  clopidogrel (PLAVIX) 75 MG tablet Take 1 tablet (75 mg total) by mouth daily. 02/24/19   Azucena Fallen, MD  cyclobenzaprine (FLEXERIL) 10 MG tablet Take 10 mg by mouth 3 (three) times daily as needed for muscle spasms.    [provider]  diphenhydrAMINE (BENADRYL) 25 MG tablet Take 25 mg by mouth at bedtime as needed for allergies.     [provider]  feeding supplement, ENSURE ENLIVE, (ENSURE ENLIVE) LIQD Take 237 mLs by mouth 2 (two) times daily between meals. 02/23/19   Azucena Fallen, MD  oxyCODONE-acetaminophen (PERCOCET/ROXICET) 5-325 MG tablet Take 1-2 tablets by mouth every 6 (six) hours as needed for severe pain. Patient not taking: Reported on 02/20/2019 01/11/18   Marshia Ly, PA-C  traMADol (ULTRAM) 50 MG tablet Take 50 mg by mouth every 8 (eight) hours as needed for moderate pain.    [provider]    Current Facility-Administered Medications  Medication Dose Route Frequency Provider Last Rate Last Dose  . acetaminophen (TYLENOL) tablet 650 mg  650 mg Oral Q4H PRN Charlton Amor, PA-C   650 mg at 03/19/19 3009   Or  . acetaminophen (TYLENOL) solution 650 mg  650 mg Per Tube Q4H PRN Angiulli, Mcarthur Rossetti, PA-C       Or  . acetaminophen (TYLENOL) suppository 650 mg  650 mg Rectal Q4H PRN Angiulli, Mcarthur Rossetti, PA-C      . aspirin EC tablet 81 mg  81 mg Oral Daily Charlton Amor, PA-C   81 mg at 03/19/19 1030  . clopidogrel (PLAVIX) tablet 75 mg  75 mg Oral Daily Charlton Amor, PA-C   75 mg at 03/19/19 1029  . diclofenac sodium (VOLTAREN) 1 % transdermal gel 2 g  2 g Topical QID Marcello Fennel, MD   2 g at 03/19/19 1030  . feeding supplement (ENSURE ENLIVE) (ENSURE ENLIVE) liquid 237 mL  237 mL Oral TID BM Marcello Fennel, MD   237 mL at 03/18/19 2001  . iron  polysaccharides (NIFEREX) capsule 150 mg  150 mg Oral Daily Faith Rogue T, MD   150 mg at 03/19/19 1029  . [START ON 03/20/2019] losartan (COZAAR) tablet 25 mg  25 mg Oral Daily Patel, Maryln Gottron, MD      . multivitamin with minerals tablet 1 tablet  1  tablet Oral Daily Marcello FennelPatel, Ankit Anil, MD   1 tablet at 03/19/19 1029  . Muscle Rub CREA   Topical PRN Marcello FennelPatel, Ankit Anil, MD      . nicotine (NICODERM CQ - dosed in mg/24 hours) patch 14 mg  14 mg Transdermal Daily Marcello FennelPatel, Ankit Anil, MD   14 mg at 03/19/19 1031  . polyethylene glycol (MIRALAX / GLYCOLAX) packet 17 g  17 g Oral BID Marcello FennelPatel, Ankit Anil, MD   Stopped at 03/19/19 0800  . senna-docusate (Senokot-S) tablet 2 tablet  2 tablet Oral QHS Marcello FennelPatel, Ankit Anil, MD   Stopped at 03/18/19 2005    Allergies as of 02/23/2019 - Review Complete 02/23/2019  Allergen Reaction Noted  . Aspirin Other (See Comments) 09/24/2015  . Ibuprofen Other (See Comments) 03/15/2018  . Penicillins Rash and Other (See Comments) 09/24/2015    Family History  Problem Relation Age of Onset  . Stroke Mother   . Heart attack Father     Social History   Socioeconomic History  . Marital status: Widowed    Spouse name: Not on file  . Number of children: Not on file  . Years of education: Not on file  . Highest education level: Not on file  Occupational History  . Occupation: retired  Engineer, productionocial Needs  . Financial resource strain: Not on file  . Food insecurity:    Worry: Not on file    Inability: Not on file  . Transportation needs:    Medical: Not on file    Non-medical: Not on file  Tobacco Use  . Smoking status: Current Every Day Smoker    Packs/day: 1.00    Years: 72.00    Pack years: 72.00    Types: Cigarettes  . Smokeless tobacco: Never Used  Substance and Sexual Activity  . Alcohol use: No  . Drug use: No  . Sexual activity: Not on file  Lifestyle  . Physical activity:    Days per week: Not on file    Minutes per session: Not on file  .  Stress: Not on file  Relationships  . Social connections:    Talks on phone: Not on file    Gets together: Not on file    Attends religious service: Not on file    Active member of club or organization: Not on file    Attends meetings of clubs or organizations: Not on file    Relationship status: Not on file  . Intimate partner violence:    Fear of current or ex partner: Not on file    Emotionally abused: Not on file    Physically abused: Not on file    Forced sexual activity: Not on file  Other Topics Concern  . Not on file  Social History Narrative  . Not on file    Review of Systems: All systems reviewed and negative except where noted in HPI.  Physical Exam: Vital signs in last 24 hours: Temp:  [98.1 F (36.7 C)-100.1 F (37.8 C)] 98.1 F (36.7 C) (05/18 1428) Pulse Rate:  [78-85] 78 (05/18 1428) Resp:  [18-26] 18 (05/18 1428) BP: (137-149)/(50-57) 140/55 (05/18 1428) SpO2:  [97 %-98 %] 98 % (05/18 0510) Last BM Date: 03/18/19 General:   Alert, well-developed,  female in NAD Psych:  Pleasant, cooperative. Somewhat of a flat affect. Eyes:  Pupils equal, sclera clear, no icterus.   Conjunctiva pink. Ears:  Normal auditory acuity. Nose:  No deformity, discharge,  or lesions. Neck:  Supple; no  masses Lungs:  Clear throughout to auscultation.   No wheezes, crackles, or rhonchi.  Heart:  Regular rate and rhythm;  no lower extremity edema Abdomen:  Soft, non-distended, nontender, BS active Rectal:  Deferred  Msk:  Symmetrical without gross deformities. . Neurologic:  Alert and  oriented x4;  Left sided weakness neurologically. Skin:  Intact without significant lesions or rashes.   Intake/Output from previous day: 05/17 0701 - 05/18 0700 In: 520 [P.O.:520] Out: -  Intake/Output this shift: No intake/output data recorded.  Lab Results: Recent Labs    03/17/19 0558 03/19/19 1249  WBC 11.8* 13.8*  HGB 8.0* 8.2*  HCT 24.4* 25.2*  PLT 516* 618*   BMET Recent  Labs    03/19/19 1249  NA 130*  K 4.0  CL 93*  CO2 24  GLUCOSE 129*  BUN 17  CREATININE 0.63  CALCIUM 8.3*   LFT Recent Labs    03/19/19 1249  PROT 5.9*  ALBUMIN 1.4*  AST 44*  ALT 75*  ALKPHOS 71  BILITOT 0.4   PT/INR No results for input(s): LABPROT, INR in the last 72 hours. Hepatitis Panel No results for input(s): HEPBSAG, HCVAB, HEPAIGM, HEPBIGM in the last 72 hours.  CTAP w/ contrast 03/16/19:  IMPRESSION: 1. No bowel obstruction or other acute abnormality identified in the abdomen or pelvis. 2. Moderate proximal colonic stool burden. 3. Age indeterminate T12 compression fracture. 4. 3 mm right middle lobe nodule. No follow-up needed if patient is low-risk. Non-contrast chest CT can be considered in 12 months if patient is high-risk. This recommendation follows the consensus statement: Guidelines for Management of Incidental Pulmonary Nodules Detected on CT Images: From the Fleischner Society 2017; Radiology 2017; 284:228-243.  Studies/Results: No results found.   Ahijah Devery, NP-C @  03/19/2019, 3:42 PM     

## 2019-03-19 NOTE — Progress Notes (Signed)
Occupational Therapy Session Note  Patient Details  Name: Connie Oconnor MRN: [id number] Date of Birth: 07/12/1941  Today's Date: 03/19/2019 OT Individual Time: 0845-0900 OT Individual Time Calculation (min): 15 min   Session 2: OT Individual Time: 1110-1215 OT Individual Time Calculation (min): 65 min    Session 3:  OT Individual Time: 1600-1640 OT Individual Time Calculation (min): 40 min   Short Term Goals: Week 4:  OT Short Term Goal 1 (Week 4): Continue working toward established LTG  Skilled Therapeutic Interventions/Progress Updates:  Session 1: Session focused on self feeding supervision and techniques. HOB elevated and pt properly positioned to ensure reduced risk of aspiration. Pt set up for gross grasp to hold magic cup in L UE and R UE to scoop spoon. Pt required intermittent cueing for bite size management. Pt able to complete grooming task supine with set up assist. Pt left supine with all needs met, bed alarm set.   Session 2: Session focused on ADL retraining. Pt received bed level agreeable to OT. Pt had c/o pain in B legs and R shoulder during mobility. Pt completed UB bathing with mod cueing and min manual facilitation for L UE to reach under RUE. Pt donned shirt with max A. Pt reported needing to use bathroom and was excited to try and get to BSC. Pt required more than reasonable time for initiation of all tasks. Pt completed bed mobility to EOB with max A and heavy cueing. Once EOB, attempted sit > stand with pt, max-total A. Pt returned to EOB and STEDY was obtained. Pt was unable to clear hips from bed with max A. Pt returned to supine with  Max A. Bed pan obtained and pt positioned, rolling L with mod A. Pt voided urine on bed pan and was provided peri hygiene. Pt left supine with all needs met, bed alarm set.   Session 3: Session focused on increasing PO intake, self feeding, attention to task. Pt received in TIS w/c with no c/o pain. Pt's lunch sitting untouched on  counter and offered to pt with pt agreeing. Pt initially able to feed self but to increase PO intake assist provided. Mod cueing provided for attending to task. Pt required manual facilitation for tipping cup back far enough to reach liquid. Assistance also required to grasp cup in L hand. Pt left sitting up in TIS in preparation for dinner.      Therapy Documentation Precautions:  Precautions Precautions: Fall Precaution Comments: L hemi, back pain, needs time to initiate movements  Restrictions Weight Bearing Restrictions: No   Therapy/Group: Individual Therapy   H  03/19/2019, 7:15 AM 

## 2019-03-19 NOTE — Progress Notes (Signed)
Speech Language Pathology Daily Session Note  Patient Details  Name: Connie Oconnor MRN: [id number] Date of Birth: 08-04-1941  Today's Date: 03/19/2019 SLP Individual Time: 4628-6381 SLP Individual Time Calculation (min): 40 min  Short Term Goals: Week 4: SLP Short Term Goal 1 (Week 4): Patient will orient to place, situation and month with Mod A multimodal cues.  SLP Short Term Goal 2 (Week 4): Patient will demonstrate sustained attention to tasks for ~5 minutes with Mod A verbal cues for redirection.  SLP Short Term Goal 3 (Week 4): Patient will initiate tasks with Mod A verbal cues.  SLP Short Term Goal 4 (Week 4): Patient will demonstrate functional problem solving with Max A mulitmodal cues.   Skilled Therapeutic Interventions: Skilled treatment session focused on cognitive goals. SLP facilitated session by providing extra time and supervision visual cues for functional problem solving and sustained attention during a basic money management task. Patient was independently oriented to place but still demonstrated generalized confusion while reporting this hospital used to be her daughters house. Overall, patient appeared brighter and more engaged this session. Patient left upright in bed with alarm on and all needs within reach. Continue with current plan of care.      Pain No/Denies Pain   Therapy/Group: Individual Therapy  Edita Weyenberg 03/19/2019, 9:55 AM

## 2019-03-19 NOTE — Progress Notes (Signed)
[facility] PHYSICAL MEDICINE & REHABILITATION PROGRESS NOTE   Subjective/Complaints: Patient seen laying in bed this AM.  She states she slept well overnight.  She states she had a good weekend; she does not tarry stool.  She is more alert this AM.    ROS: Limited due to cognition, but appears to deny CP, shortness of breath, nausea, vomiting, diarrhea.  Objective:   No results found. Recent Labs    03/17/19 0558 03/19/19 1249  WBC 11.8* 13.8*  HGB 8.0* 8.2*  HCT 24.4* 25.2*  PLT 516* 618*   Recent Labs    03/19/19 1249  NA 130*  K 4.0  CL 93*  CO2 24  GLUCOSE 129*  BUN 17  CREATININE 0.63  CALCIUM 8.3*    Intake/Output Summary (Last 24 hours) at 03/19/2019 1441 Last data filed at 03/18/2019 1900 Gross per 24 hour  Intake 120 ml  Output -  Net 120 ml     Physical Exam: Vital Signs Blood pressure (!) 140/55, pulse 78, temperature 98.1 F (36.7 C), temperature source Oral, resp. rate 18, height  (1.651 m), weight 68.5 kg, SpO2 98 %. Constitutional: No distress . Vital signs reviewed. HENT: Normocephalic, atraumatic.  Eyes: EOMI. No discharge. Cardiovascular: No JVD. Respiratory: Normal effort. GI: Non-distended. Musc: Right hip TTP. Neurologic: Alert and and oriented x3 Motor:  Left upper extremity: Shoulder abduction 3/5, distally 4+/5, unchanged Left lower extremity: 4+/5 proximal to distal, unchanged Right lower extremity: 2/5 hip flexion (pain inhibition), knee extension, ankle dorsiflexion, (variable with participation) Skin: Warm and dry.  Intact.  Assessment/Plan: 1. Functional deficits secondary to right corona radiata infarct which require 3+ hours per day of interdisciplinary therapy in a comprehensive inpatient rehab setting.  Physiatrist is providing close team supervision and 24 hour management of active medical problems listed below.  Physiatrist and rehab team continue to assess barriers to discharge/monitor patient progress toward  functional and medical goals  Care Tool:  Bathing  Bathing activity did not occur: Safety/medical concerns Body parts bathed by patient: Left arm, Chest, Abdomen, Front perineal area, Right upper leg, Left upper leg   Body parts bathed by helper: Right arm, Buttocks, Right lower leg, Left lower leg     Bathing assist Assist Level: Moderate Assistance - Patient 50 - 74%     Upper Body Dressing/Undressing Upper body dressing   What is the patient wearing?: Pull over shirt    Upper body assist Assist Level: Maximal Assistance - Patient 25 - 49%    Lower Body Dressing/Undressing Lower body dressing      What is the patient wearing?: Pants, Incontinence brief     Lower body assist Assist for lower body dressing: Total Assistance - Patient < 25%     Toileting Toileting    Toileting assist Assist for toileting: Dependent - Patient 0%     Transfers Chair/bed transfer  Transfers assist     Chair/bed transfer assist level: 2 Helpers     Locomotion Ambulation   Ambulation assist   Ambulation activity did not occur: Safety/medical concerns(fatigue/pain )  Assist level: Moderate Assistance - Patient 50 - 74% Assistive device: Walker-rolling(and L hand splint) Max distance: 5'   Walk 10 feet activity   Assist  Walk 10 feet activity did not occur: Safety/medical concerns(fatigue/pain )        Walk 50 feet activity   Assist Walk 50 feet with 2 turns activity did not occur: Safety/medical concerns(fatigue/pain )  Walk 150 feet activity   Assist Walk 150 feet activity did not occur: Safety/medical concerns(fatigue/pain )         Walk 10 feet on uneven surface  activity   Assist Walk 10 feet on uneven surfaces activity did not occur: Safety/medical concerns(fatigue/pain )         Wheelchair     Assist Will patient use wheelchair at discharge?: Yes Type of Wheelchair: Manual    Wheelchair assist level: Supervision/Verbal  cueing Max wheelchair distance: 2115'    Wheelchair 50 feet with 2 turns activity    Assist    Wheelchair 50 feet with 2 turns activity did not occur: Safety/medical concerns(fatigue/pain )       Wheelchair 150 feet activity     Assist Wheelchair 150 feet activity did not occur: Safety/medical concerns(fatigue/pain )        Medical Problem List and Plan: 1.Left side weaknesssecondary to 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  Cont CIR Plan outpatient coiling of aneurysm per interventional radiology  Will consider anti-spasticity medications if necessary, not appropriate at this point  CT head reviewed, unremarkable for acute intracranial process, but showing signs of microvascular changes  Weekend notes reviewed - tarry stools 2. Antithrombotics: -DVT/anticoagulation:SCDs.  Lower extremity Dopplers negative DVT -antiplatelet therapy: aspirin 81 mg daily Plavix 75 mg daily 3 weeks and Plavix alone 3. Pain Management/migraine headaches:Tramadol and Flexeril DC'd  Added kpad for low back  Muscle rub ordered for left calf and right thigh- improved 4. Mood:Provide emotional support -antipsychotic agents: N/A 5. Neuropsych/vascular dementia: This patientis not capable of making decisions on herown behalf.  Cont tele-sitter for safety  Needs significant encouragement 6. Skin/Wound Care:Routine skin checks 7. Fluids/Electrolytes/Nutrition:Routine in and outs  D3 thins, advance diet as tolerated 8. [facility]. Patient seen in the past by Dr. Conchita ParisNundkumar 9. Hypertension.  Vitals:   03/19/19 0510 03/19/19 1428  BP: (!) 149/50 (!) 140/55  Pulse: 78 78  Resp: 20 18  Temp: 98.4 F (36.9 C) 98.1 F (36.7 C)  SpO2: 98%    Norvasc 10 mg daily, decreased to 5 mg on 4/26, DC'd  on 5/14 due to daughter's concern of side effects  Losartan 12.5 daily started on 5/16 (daughter states she personally is able to tolerate this), increased to 25 on 5/19 10. Hyperlipidemia. Lipitor 11. Tobacco abuse. Counseling 12.  Hyperglycemia  Hemoglobin A1c 5.5 on 01/2019  P.o. intake remains inconsistent on 5/18  Patient needs significant time and cueing to encourage intake. 13.  Acute blood loss anemia  Hemoccult ordered and positive, repeat negative, positive again on 5/11  Labs ordered for Monday  Hgb 8.2 on 5/18  Added Fe++ supp  Tarry stools, will consult GI 14.  Hypoalbuminemia  Supplement initiated on 4/27 15.  Slow transit constipation  Bowel regimen increased on 5/3, increased again on 5/4  Improving 16.  Confusion-multifactorial  Vascular dementia  WBCs 13.8 on 5/18, labs ordered for tomorrow  Improving overall  Urine culture with MRSA  Completed Macrobid started on 5/6-5/13  Repeat UA relatively unremarkable, repeat urine culture no growth  CT chest reviewed showing emphysema with scattered pulmonary nodules with outpatient follow-up scan.  Ammonia within normal limits  Lipitor, amlodipine DC'd per daughter request [id number].  Hip OA  Bilaterally noted on x-ray, personally reviewed.  Voltaren gel ordered with improvement 18.  Emphysema  with scattered pulmonary nodules  Repeat chest CT as outpatient  Noted on CT  ABG reviewed showing decreased PO2  Supplemental oxygen ordered 19.  Transaminitis  LFTs elevated, but improving on 5/18  Lipitor DC'd 20.  Hyponatremia  Sodium 130 on 5/18  Continue to monitor 21.  Tobacco abuse  Patient heavy smoker at baseline  Nicotine patch ordered on 5/14     LOS: 24 days A FACE TO FACE EVALUATION WAS PERFORMED  Hagan Maltz Karis Juba 03/19/2019, 2:41 PM

## 2019-03-19 NOTE — Progress Notes (Signed)
Nutrition Follow-up  DOCUMENTATION CODES:   Severe malnutrition in context of acute illness/injury - new diagnosis after completion of NFPE  INTERVENTION:   - Please obtain new weight, last weight is from 4/25  - Pt with severe acute malnutrition. Recommend Cortrak NGT and short-term enteral nutrition given prolonged poor PO intake since admission and mild to moderate fat and muscle depletions per NFPE. RD has communicated recommendation to PA.  -Continue Ensure Enlive poTID, each supplement provides 350 kcal and 20 grams of protein  -ContinueMagicCup TID with meals, each supplement provides 290 kcal and 9 grams of protein  -ContinueMVI with minerals daily  - Encourage adequate PO intake  NUTRITION DIAGNOSIS:   Severe Malnutrition related to acute illness (acute lacunar infarct) as evidenced by energy intake < or equal to 50% for > or equal to 5 days, mild muscle depletion, moderate muscle depletion, mild fat depletion, moderate fat depletion.  New diagnosis after completion of NFPE  GOAL:   Patient will meet greater than or equal to 90% of their needs  Progressing  MONITOR:   PO intake, Supplement acceptance, Labs, Weight trends, Skin  REASON FOR ASSESSMENT:   Malnutrition Screening Tool    ASSESSMENT:   78 y.o. female with history of PVC, hypothyroidism, arthritis, migraines presenting with L sided weakness to [place] on 4/21. MRI small R CR infarct, 67mm R MCA aneurysm. Admitted to [facility] on 4/24.  Discussed pt with RN. Poor PO intake continues. Per RN, pt is not taking Ensure Enlive because pt believes it causes her to have diarrhea.  Spoke with pt at bedside. Pt reports that she has a very poor appetite. Pt unable to answer most of RD questions at this time.  RD able to conduct NFPE. Given findings from NFPE and poor PO intake, pt now meets criteria for malnutrition.  Noted ~75% completed breakfast meal tray at bedside as well as  untouched lunch tray.  Secure messaged PA regarding recommendation for Cortrak and tube feeds.  Meal Completion: 0-50% x last 8 meals  Medications reviewed and include: Ensure Enlive TID, Niferex, MVI with minerals, Miralax, Senna  Labs reviewed: sodium 132 (L), chloride 96 (L), elevated LFTs, hemoglobin 8.0 (L)  NUTRITION - FOCUSED PHYSICAL EXAM:    Most Recent Value  Orbital Region  Moderate depletion  Upper Arm Region  No depletion  Thoracic and Lumbar Region  No depletion  Buccal Region  Mild depletion  Temple Region  Mild depletion  Clavicle Bone Region  Moderate depletion  Clavicle and Acromion Bone Region  Moderate depletion  Scapular Bone Region  Moderate depletion  Dorsal Hand  Moderate depletion  Patellar Region  Moderate depletion  Anterior Thigh Region  Moderate depletion  Posterior Calf Region  Mild depletion  Edema (RD Assessment)  None  Hair  Reviewed  Eyes  Reviewed  Mouth  Reviewed  Skin  Reviewed  Nails  Reviewed      Diet Order:   Diet Order            DIET DYS 3 Room service appropriate? Yes; Fluid consistency: Thin  Diet effective now              EDUCATION NEEDS:   Not appropriate for education at this time  Skin:  Skin Assessment: Reviewed RN Assessment (MASD to perineum)  Last BM:  03/18/19  Height:   Ht Readings from Last 1 Encounters:  02/23/19 5\' 5"  (1.651 m)    Weight:   Wt Readings from  Last 1 Encounters:  02/24/19 68.5 kg    Ideal Body Weight:  56.8 kg  BMI:  Body mass index is 25.13 kg/m.  Estimated Nutritional Needs:   Kcal:  1700-1900 kcals   Protein:  80-90 g  Fluid:  >/= 1.7 L    Earma ReadingKate Jablonski Onyinyechi Huante, MS, RD, LDN Inpatient Clinical Dietitian Pager: [phone] Weekend/After Hours: [phone]

## 2019-03-19 NOTE — Progress Notes (Signed)
Physical Therapy Session Note  Patient Details  Name: Antigone Harrold MRN: [id number] Date of Birth: 12/24/1940  Today's Date: 03/19/2019 PT Individual Time: 1300-1400 PT Individual Time Calculation (min): 60 min   Short Term Goals: Week 3:  PT Short Term Goal 1 (Week 3): pt will balance in sitting wiht feet supported, reaching within BOS , with supervision PT Short Term Goal 2 (Week 3): pt will initiate gait training PT Short Term Goal 3 (Week 3): pt will perform stand pivot transfers with mod-min assist consistently PT Short Term Goal 4 (Week 3): Patient will propel a hemi-hieght w/c 25' with use of up to 4 extremities.   Skilled Therapeutic Interventions/Progress Updates:    Patient in supine and reports fatigued from OT.  Applied THT stockings total A and pants in supine with total A pt rolling with mod A increased time.  Supine to sit max A for legs off bed and to lift trunk.  Patient seated EOB min to mod A for balance due to leaning forward and to L.  Transfer to w/c to R max A via squat pivot then scooting back in chair mod A lateral leans and scooting.  Patient in TIS w/c assisted to therapy gym and transfer via squat pivot to L with max A increased time.  Sitting balance on EOM leaning on R elbow and A to stretch L side and elongate trunk and activated L side in sitting.  Patient leaning back and wanting to lie down so transferred back to w/c max A to L squat pivot.  Assisted in TIS w/c to room and left seated with half lap tray, alarm belt and call bell in reach.  RN informed needing to return to supine at 2:30 to prep for next session at 4 pm.    Therapy Documentation Precautions:  Precautions Precautions: Fall Precaution Comments: L hemi, back pain, needs time to initiate movements  Restrictions Weight Bearing Restrictions: No Pain: Pain Assessment Faces Pain Scale: Hurts even more Pain Type: Chronic pain Pain Location: Hip Pain Orientation: Right;Left Pain Descriptors /  Indicators: Spasm Pain Onset: With Activity Pain Intervention(s): Rest    Therapy/Group: Individual Therapy  Elray Mcgregor  [name], PT 03/19/2019, 4:49 PM

## 2019-03-19 NOTE — Consult Note (Signed)
Referring Provider:    Maryla Morrow, MD      Primary Care Physician:  Heide Scales, PA-C Primary Gastroenterologist:  Previously Dr. Jarold Motto           Reason for Consultation:   Anemia , heme + stool         ASSESSMENT / PLAN:    1. 78 yo female with new [place] anemia / heme positive stools on ASA + Plavix. Baseline hgb ~14 with slow drift this admission to 8.2. Reports "tarry" stool over weekend but oral iron started a week ago and no corresponding drop in hgb over weekend.  -Reports recent N/V, ? Brown colored emesis but poor historian. CTAP w/ contrast for N/V on 03/16/19 was unrevealing. Will probably need EGD (on plavix) at some point. She is > 10 years out from last colonoscopy but with recent CVA and inability to hold Plavix this can probably be deferred to outpatient setting when medically stable.  -NPO after MN for probable EGD tomorrow.   2. 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 Has left sided weakness. On Plavix + ASA  3. Chronic constipation. Currently doing well on Senna and MIralax  4. Mildly abnormal liver tests (new and transient).  Yesterday AST in 60s / ALT 99 and improved overnight.  -monitor for now.  -steatosis on CTAP w/ contrast 03/16/19.    HPI:   Connie Oconnor is a 78 y.o. female with hypothyroidism, SAH from a fall and tobacco abuse admitted late April with CVA. She didn't receive TPA. Cerebral arteriogram showed a right MCA aneurysm. She was started on a daily baby aspirin + plavix for 3 weeks followed then by just Plavix. Plan was for eventual outpatient coiling of the aneurysm by IR.   Patient started on iron one week ago. Over weekend there were reports of had tarry , formed stools. She reports chronic constipation but BMs moving okay not on Senna and Miralax. No abdominal pain. She has had some N/V earlier in the admission. No hematemesis but she thinks emesis may have been brown in color. At home prior to admission she wasn't  taking any NSAIDs.    GI studies:  Colonoscopy 2009 for diarrhea. Complete exam with excellent prep. Normal test. Biopsies for evaluation of diarrhea - results not in EPIC  PERTINENT LABS:   Hgb 02/22/19    14 Hgb 03/05/19      11.2 Hgb 03/10/19        9.0 Hgb 03/17/19     8.0 Hgb 03/20/19      8.2   Ferritin 357, TIBC 217, 6% sat   MOST RECENT GI STUDIES:    Past Medical History:  Diagnosis Date  . Anemia    hx of  . Arthritis   . Dislocation of right knee with medial meniscus tear   . Headache    hx of migraines none since 1990's  . Hypothyroidism    1978, no problems since  . Pneumonia 1990's none since  . PVC (premature ventricular contraction)    199'0's none since  . SAH (subarachnoid hemorrhage) (HCC) 2016   On the right    Past Surgical History:  Procedure Laterality Date  . BACK SURGERY    . CERVICAL LAMINECTOMY  1986  . DILATION AND CURETTAGE OF UTERUS  1970  . ELBOW SURGERY Right 1993  . IR ANGIO INTRA EXTRACRAN SEL COM CAROTID INNOMINATE BILAT MOD SED  02/22/2019  . IR ANGIO VERTEBRAL  SEL SUBCLAVIAN INNOMINATE UNI L MOD SED  02/22/2019  . IR ANGIO VERTEBRAL SEL VERTEBRAL UNI R MOD SED  02/22/2019  . KNEE ARTHROSCOPY WITH SUBCHONDROPLASTY Right 01/11/2018   Procedure: KNEE ARTHROSCOPY WITH SUBCHONDROPLASTY, PARTIAL MEDIAL MENISCECTOMY, CHONDROPLASTY OF THE MEDIAL PATELLA FEMORAL CHONDYL, MEDIAL PLICA  EXCISION;  Surgeon: Jodi Geralds, MD;  Location: [facility];  Service: Orthopedics;  Laterality: Right;  . LUMBAR LAMINECTOMY  1993   rods placed bone generated removed one year later   . MULTIPLE TOOTH EXTRACTIONS    . SHOULDER SURGERY Left    arthritis clean out and shortened collar bone dr Renae Fickle  . TONSILLECTOMY  1970  . VAGINAL HYSTERECTOMY  1971   partial dr Dewaine Conger  . WISDOM TOOTH EXTRACTION      Prior to Admission medications   Medication Sig Start Date End Date Taking? Authorizing Provider  amLODipine (NORVASC) 10 MG tablet Take 1 tablet (10 mg total) by  mouth daily. 02/24/19   Azucena Fallen, MD  atorvastatin (LIPITOR) 40 MG tablet Take 1 tablet (40 mg total) by mouth daily at 6 PM. 02/23/19   Azucena Fallen, MD  clopidogrel (PLAVIX) 75 MG tablet Take 1 tablet (75 mg total) by mouth daily. 02/24/19   Azucena Fallen, MD  cyclobenzaprine (FLEXERIL) 10 MG tablet Take 10 mg by mouth 3 (three) times daily as needed for muscle spasms.    [provider]  diphenhydrAMINE (BENADRYL) 25 MG tablet Take 25 mg by mouth at bedtime as needed for allergies.     [provider]  feeding supplement, ENSURE ENLIVE, (ENSURE ENLIVE) LIQD Take 237 mLs by mouth 2 (two) times daily between meals. 02/23/19   Azucena Fallen, MD  oxyCODONE-acetaminophen (PERCOCET/ROXICET) 5-325 MG tablet Take 1-2 tablets by mouth every 6 (six) hours as needed for severe pain. Patient not taking: Reported on 02/20/2019 01/11/18   Marshia Ly, PA-C  traMADol (ULTRAM) 50 MG tablet Take 50 mg by mouth every 8 (eight) hours as needed for moderate pain.    [provider]    Current Facility-Administered Medications  Medication Dose Route Frequency Provider Last Rate Last Dose  . acetaminophen (TYLENOL) tablet 650 mg  650 mg Oral Q4H PRN Charlton Amor, PA-C   650 mg at 03/19/19 3009   Or  . acetaminophen (TYLENOL) solution 650 mg  650 mg Per Tube Q4H PRN Angiulli, Mcarthur Rossetti, PA-C       Or  . acetaminophen (TYLENOL) suppository 650 mg  650 mg Rectal Q4H PRN Angiulli, Mcarthur Rossetti, PA-C      . aspirin EC tablet 81 mg  81 mg Oral Daily Charlton Amor, PA-C   81 mg at 03/19/19 1030  . clopidogrel (PLAVIX) tablet 75 mg  75 mg Oral Daily Charlton Amor, PA-C   75 mg at 03/19/19 1029  . diclofenac sodium (VOLTAREN) 1 % transdermal gel 2 g  2 g Topical QID Marcello Fennel, MD   2 g at 03/19/19 1030  . feeding supplement (ENSURE ENLIVE) (ENSURE ENLIVE) liquid 237 mL  237 mL Oral TID BM Marcello Fennel, MD   237 mL at 03/18/19 2001  . iron  polysaccharides (NIFEREX) capsule 150 mg  150 mg Oral Daily Faith Rogue T, MD   150 mg at 03/19/19 1029  . [START ON 03/20/2019] losartan (COZAAR) tablet 25 mg  25 mg Oral Daily Patel, Maryln Gottron, MD      . multivitamin with minerals tablet 1 tablet  1  tablet Oral Daily Marcello FennelPatel, Ankit Anil, MD   1 tablet at 03/19/19 1029  . Muscle Rub CREA   Topical PRN Marcello FennelPatel, Ankit Anil, MD      . nicotine (NICODERM CQ - dosed in mg/24 hours) patch 14 mg  14 mg Transdermal Daily Marcello FennelPatel, Ankit Anil, MD   14 mg at 03/19/19 1031  . polyethylene glycol (MIRALAX / GLYCOLAX) packet 17 g  17 g Oral BID Marcello FennelPatel, Ankit Anil, MD   Stopped at 03/19/19 0800  . senna-docusate (Senokot-S) tablet 2 tablet  2 tablet Oral QHS Marcello FennelPatel, Ankit Anil, MD   Stopped at 03/18/19 2005    Allergies as of 02/23/2019 - Review Complete 02/23/2019  Allergen Reaction Noted  . Aspirin Other (See Comments) 09/24/2015  . Ibuprofen Other (See Comments) 03/15/2018  . Penicillins Rash and Other (See Comments) 09/24/2015    Family History  Problem Relation Age of Onset  . Stroke Mother   . Heart attack Father     Social History   Socioeconomic History  . Marital status: Widowed    Spouse name: Not on file  . Number of children: Not on file  . Years of education: Not on file  . Highest education level: Not on file  Occupational History  . Occupation: retired  Engineer, productionocial Needs  . Financial resource strain: Not on file  . Food insecurity:    Worry: Not on file    Inability: Not on file  . Transportation needs:    Medical: Not on file    Non-medical: Not on file  Tobacco Use  . Smoking status: Current Every Day Smoker    Packs/day: 1.00    Years: 72.00    Pack years: 72.00    Types: Cigarettes  . Smokeless tobacco: Never Used  Substance and Sexual Activity  . Alcohol use: No  . Drug use: No  . Sexual activity: Not on file  Lifestyle  . Physical activity:    Days per week: Not on file    Minutes per session: Not on file  .  Stress: Not on file  Relationships  . Social connections:    Talks on phone: Not on file    Gets together: Not on file    Attends religious service: Not on file    Active member of club or organization: Not on file    Attends meetings of clubs or organizations: Not on file    Relationship status: Not on file  . Intimate partner violence:    Fear of current or ex partner: Not on file    Emotionally abused: Not on file    Physically abused: Not on file    Forced sexual activity: Not on file  Other Topics Concern  . Not on file  Social History Narrative  . Not on file    Review of Systems: All systems reviewed and negative except where noted in HPI.  Physical Exam: Vital signs in last 24 hours: Temp:  [98.1 F (36.7 C)-100.1 F (37.8 C)] 98.1 F (36.7 C) (05/18 1428) Pulse Rate:  [78-85] 78 (05/18 1428) Resp:  [18-26] 18 (05/18 1428) BP: (137-149)/(50-57) 140/55 (05/18 1428) SpO2:  [97 %-98 %] 98 % (05/18 0510) Last BM Date: 03/18/19 General:   Alert, well-developed,  female in NAD Psych:  Pleasant, cooperative. Somewhat of a flat affect. Eyes:  Pupils equal, sclera clear, no icterus.   Conjunctiva pink. Ears:  Normal auditory acuity. Nose:  No deformity, discharge,  or lesions. Neck:  Supple; no  masses Lungs:  Clear throughout to auscultation.   No wheezes, crackles, or rhonchi.  Heart:  Regular rate and rhythm;  no lower extremity edema Abdomen:  Soft, non-distended, nontender, BS active Rectal:  Deferred  Msk:  Symmetrical without gross deformities. . Neurologic:  Alert and  oriented x4;  Left sided weakness neurologically. Skin:  Intact without significant lesions or rashes.   Intake/Output from previous day: 05/17 0701 - 05/18 0700 In: 520 [P.O.:520] Out: -  Intake/Output this shift: No intake/output data recorded.  Lab Results: Recent Labs    03/17/19 0558 03/19/19 1249  WBC 11.8* 13.8*  HGB 8.0* 8.2*  HCT 24.4* 25.2*  PLT 516* 618*   BMET Recent  Labs    03/19/19 1249  NA 130*  K 4.0  CL 93*  CO2 24  GLUCOSE 129*  BUN 17  CREATININE 0.63  CALCIUM 8.3*   LFT Recent Labs    03/19/19 1249  PROT 5.9*  ALBUMIN 1.4*  AST 44*  ALT 75*  ALKPHOS 71  BILITOT 0.4   PT/INR No results for input(s): LABPROT, INR in the last 72 hours. Hepatitis Panel No results for input(s): HEPBSAG, HCVAB, HEPAIGM, HEPBIGM in the last 72 hours.  CTAP w/ contrast 03/16/19:  IMPRESSION: 1. No bowel obstruction or other acute abnormality identified in the abdomen or pelvis. 2. Moderate proximal colonic stool burden. 3. Age indeterminate T12 compression fracture. 4. 3 mm right middle lobe nodule. No follow-up needed if patient is low-risk. Non-contrast chest CT can be considered in 12 months if patient is high-risk. This recommendation follows the consensus statement: Guidelines for Management of Incidental Pulmonary Nodules Detected on CT Images: From the Fleischner Society 2017; Radiology 2017; 284:228-243.  Studies/Results: No results found.   Willette Cluster, NP-C @  03/19/2019, 3:42 PM

## 2019-03-20 ENCOUNTER — Inpatient Hospital Stay (HOSPITAL_COMMUNITY): Payer: Medicare Other

## 2019-03-20 ENCOUNTER — Encounter (HOSPITAL_COMMUNITY)
Admission: RE | Disposition: A | Discharge status: Skilled Nursing Facility | Payer: Self-pay | Source: Intra-hospital | Attending: Physical Medicine & Rehabilitation

## 2019-03-20 ENCOUNTER — Inpatient Hospital Stay (HOSPITAL_COMMUNITY): Payer: Medicare Other | Admitting: Certified Registered Nurse Anesthetist

## 2019-03-20 ENCOUNTER — Inpatient Hospital Stay (HOSPITAL_COMMUNITY): Payer: Medicare Other | Admitting: Speech Pathology

## 2019-03-20 ENCOUNTER — Encounter (HOSPITAL_COMMUNITY): Payer: Self-pay | Admitting: Certified Registered Nurse Anesthetist

## 2019-03-20 DIAGNOSIS — K210 Gastro-esophageal reflux disease with esophagitis: Secondary | ICD-10-CM

## 2019-03-20 DIAGNOSIS — K257 Chronic gastric ulcer without hemorrhage or perforation: Secondary | ICD-10-CM

## 2019-03-20 DIAGNOSIS — K3189 Other diseases of stomach and duodenum: Secondary | ICD-10-CM

## 2019-03-20 DIAGNOSIS — K259 Gastric ulcer, unspecified as acute or chronic, without hemorrhage or perforation: Secondary | ICD-10-CM

## 2019-03-20 HISTORY — PX: ESOPHAGOGASTRODUODENOSCOPY: SHX5428

## 2019-03-20 SURGERY — EGD (ESOPHAGOGASTRODUODENOSCOPY)
Anesthesia: Monitor Anesthesia Care

## 2019-03-20 MED ORDER — PROPOFOL 500 MG/50ML IV EMUL
INTRAVENOUS | Status: DC | PRN
  Administered 2019-03-20: 100 ug/kg/min via INTRAVENOUS

## 2019-03-20 MED ORDER — LACTATED RINGERS IV SOLN
INTRAVENOUS | Status: DC | PRN
  Administered 2019-03-20: 12:00:00 via INTRAVENOUS

## 2019-03-20 MED ORDER — LACTATED RINGERS IV SOLN
INTRAVENOUS | Status: AC | PRN
  Administered 2019-03-20: 1000 mL via INTRAVENOUS

## 2019-03-20 NOTE — Transfer of Care (Signed)
Immediate Anesthesia Transfer of Care Note  Patient: Connie Oconnor  Procedure(s) Performed: ESOPHAGOGASTRODUODENOSCOPY (EGD) (N/A )  Patient Location: Endoscopy Unit  Anesthesia Type:MAC  Level of Consciousness: awake, alert  and oriented  Airway & Oxygen Therapy: Patient Spontanous Breathing and Patient connected to nasal cannula oxygen  Post-op Assessment: Report given to RN, Post -op Vital signs reviewed and stable and Patient moving all extremities X 4  Post vital signs: Reviewed and stable  Last Vitals:  Vitals Value Taken Time  BP 113/42 03/20/2019 12:54 PM  Temp    Pulse 74 03/20/2019 12:55 PM  Resp 27 03/20/2019 12:55 PM  SpO2 95 % 03/20/2019 12:55 PM  Vitals shown include unvalidated device data.  Last Pain:  Vitals:   03/20/19 1145  TempSrc: Oral  PainSc: 0-No pain      Patients Stated Pain Goal: 2 (45/84/83 5075)  Complications: No apparent anesthesia complications

## 2019-03-20 NOTE — Progress Notes (Signed)
Social Work Patient ID: Connie Oconnor, female   DOB: 18-Mar-1941, 78 y.o.   MRN: [id number]  Pt having medical procedure today per MD. Will continue to look for NH bed. [name]-daughter expects call today from MD. He is aware of this.

## 2019-03-20 NOTE — Anesthesia Preprocedure Evaluation (Addendum)
Anesthesia Evaluation  Patient identified by MRN, date of birth, ID band Patient awake    Reviewed: Allergy & Precautions, NPO status , Patient's Chart, lab work & pertinent test results  Airway Mallampati: II  TM Distance: >3 FB     Dental  (+) Edentulous Upper, Edentulous Lower, Dental Advisory Given   Pulmonary COPD, Current Smoker,    breath sounds clear to auscultation       Cardiovascular hypertension,  Rhythm:Regular Rate:Normal     Neuro/Psych  Headaches, CVA    GI/Hepatic Neg liver ROS, GI bleed   Endo/Other  Hypothyroidism   Renal/GU negative Renal ROS     Musculoskeletal  (+) Arthritis ,   Abdominal   Peds  Hematology  (+) anemia ,   Anesthesia Other Findings   Reproductive/Obstetrics                            Lab Results  Component Value Date   WBC 13.8 (H) 03/19/2019   HGB 8.2 (L) 03/19/2019   HCT 25.2 (L) 03/19/2019   MCV 82.6 03/19/2019   PLT 618 (H) 03/19/2019   Lab Results  Component Value Date   CREATININE 0.63 03/19/2019   BUN 17 03/19/2019   NA 130 (L) 03/19/2019   K 4.0 03/19/2019   CL 93 (L) 03/19/2019   CO2 24 03/19/2019    Anesthesia Physical Anesthesia Plan  ASA: III  Anesthesia Plan: MAC   Post-op Pain Management:    Induction: Intravenous  PONV Risk Score and Plan: 1 and Ondansetron, Propofol infusion and Treatment may vary due to age or medical condition  Airway Management Planned: Natural Airway and Nasal Cannula  Additional Equipment:   Intra-op Plan:   Post-operative Plan:   Informed Consent: I have reviewed the patients History and Physical, chart, labs and discussed the procedure including the risks, benefits and alternatives for the proposed anesthesia with the patient or authorized representative who has indicated his/her understanding and acceptance.       Plan Discussed with: CRNA  Anesthesia Plan Comments:          Anesthesia Quick Evaluation

## 2019-03-20 NOTE — Progress Notes (Signed)
Occupational Therapy Note  Patient Details  Name: Connie Oconnor MRN: [id number] Date of Birth: January 27, 1941  Today's Date: 03/20/2019 OT Missed Time: 60 Minutes Missed Time Reason: Unavailable (comment)(pt at procedure)  Pt at endoscopy during scheduled OT time. OT attempted to follow up at 1600 but pt sound asleep and falling asleep mid conversation. OT will resume tomorrow 5/20.    Crissie Reese 03/20/2019, 4:33 PM

## 2019-03-20 NOTE — Anesthesia Procedure Notes (Signed)
Procedure Name: MAC Date/Time: 03/20/2019 12:30 PM Performed by: Harden Mo, CRNA Pre-anesthesia Checklist: Patient identified, Emergency Drugs available, Suction available and Patient being monitored Patient Re-evaluated:Patient Re-evaluated prior to induction Oxygen Delivery Method: Nasal cannula Preoxygenation: Pre-oxygenation with 100% oxygen Induction Type: IV induction Placement Confirmation: positive ETCO2 and breath sounds checked- equal and bilateral Dental Injury: Teeth and Oropharynx as per pre-operative assessment

## 2019-03-20 NOTE — Anesthesia Postprocedure Evaluation (Signed)
Anesthesia Post Note  Patient: Connie Oconnor  Procedure(s) Performed: ESOPHAGOGASTRODUODENOSCOPY (EGD) (N/A )     Patient location during evaluation: PACU Anesthesia Type: MAC Level of consciousness: awake and alert Pain management: pain level controlled Vital Signs Assessment: post-procedure vital signs reviewed and stable Respiratory status: spontaneous breathing, nonlabored ventilation, respiratory function stable and patient connected to nasal cannula oxygen Cardiovascular status: stable and blood pressure returned to baseline Postop Assessment: no apparent nausea or vomiting Anesthetic complications: no    Last Vitals:  Vitals:   03/20/19 1305 03/20/19 1320  BP: 106/68 (!) 142/61  Pulse: 70 90  Resp: 17 (!) 22  Temp:    SpO2: 100% 100%    Last Pain:  Vitals:   03/20/19 1320  TempSrc:   PainSc: 0-No pain                 Tiajuana Amass

## 2019-03-20 NOTE — Progress Notes (Signed)
Physical Therapy Weekly Progress Note  Patient Details  Name: Connie Oconnor MRN: [id number] Date of Birth: [birth year]  Beginning of progress report period: Mar 13, 2019 End of progress report period: Mar 20, 2019  Today's Date: 03/20/2019 PT Individual Time: 3825-0539 PT Individual Time Calculation (min): 42 min   Patient has met 0 of 4 short term goals.  Connie Oconnor consistently is limited due to pain with movement of her legs with spasms in bilateral but L >R during movement as well as due to limited attention span and varying levels of awareness and participation.  She has improved with allowing treatment with consistency in caregiver, but remains significantly limited by pain.  Feel she may improve with transfers if trialing slide board for transfers and given continued extra time to participate  Patient continues to demonstrate the following deficits muscle joint tightness and muscle paralysis, impaired timing and sequencing, abnormal tone and decreased motor planning, decreased midline orientation and decreased motor planning and decreased initiation, decreased attention, decreased awareness and decreased problem solving and therefore will continue to benefit from skilled PT intervention to increase functional independence with mobility.  Patient not progressing toward long term goals.  See goal revision..  Plan of care revisions: discontinued wheelchair mobility goal, difficult due to pain and limited attention..  PT Short Term Goals Week 3:  PT Short Term Goal 1 (Week 3): pt will balance in sitting wiht feet supported, reaching within BOS , with supervision PT Short Term Goal 1 - Progress (Week 3): Progressing toward goal PT Short Term Goal 2 (Week 3): pt will initiate gait training PT Short Term Goal 2 - Progress (Week 3): Not progressing PT Short Term Goal 3 (Week 3): pt will perform stand pivot transfers with mod-min assist consistently PT Short Term Goal 3 - Progress (Week 3): Not  progressing PT Short Term Goal 4 (Week 3): Patient will propel a hemi-hieght w/c 25' with use of up to 4 extremities.  PT Short Term Goal 4 - Progress (Week 3): Not progressing Week 4:  PT Short Term Goal 1 (Week 4): Patient will demonstrate slide board transfers with mod to max A +1 PT Short Term Goal 2 (Week 4): Patient will demonstrate sitting balance with min A at least 50% of the time to allow for functional sitting task EOB. PT Short Term Goal 3 (Week 4): Patient to perform sit to stand with Stedy with mod A +2 to ease toilet transfers  Skilled Therapeutic Interventions/Progress Updates:    Patient in supine following EGD earlier this pm.  Awake and willing to attempt sitting EOB.  Patient rolled to R with increased time and assist to guide L hand to rail and use of bed pad, but pt c/o pain in LE's and rolled back.  Attempted to slowly assist bringing legs off bed and lifting trunk max A, but pt in pain once legs off bed and pushed back to supine.  Assist to lift trunk then bring legs off bed and pt supported from posterior and scooted to EOB with max A.  Seated EOB with min A x about 20 minutes during which time she was able to take her medications and drink some coffee.  Also performed LAQ's x 5 on R LE and scooted toward [facility] with Max to total A+1 using bed pad under her.  Patient requesting to lie down, +2 A with nursing to lower head and lift legs and scoot up in bed.  Assisted to bring legs down supported on 1  flat pillow and positioned HOB about 40 degrees as she had just had her medication.  Educated on importance of keeping legs down to avoid joint contractures.  Patient left with bed alarm activated and call bell in reach, 4 siderails up.   Therapy Documentation Precautions:  Precautions Precautions: Fall Precaution Comments: L hemi, back pain, needs time to initiate movements  Restrictions Weight Bearing Restrictions: No General: PT Amount of Missed Time (min): 18 Minutes PT Missed  Treatment Reason: Patient fatigue;Other (Comment)(procedure earlier this afternoon) Pain: Pain Assessment Pain Scale: 0-10 Pain Score: 0-No pain Faces Pain Scale: Hurts whole lot Pain Location: Hip Pain Orientation: Right;Left Pain Descriptors / Indicators: Cramping Pain Onset: With Activity Pain Intervention(s): Distraction;Rest;Emotional support   Therapy/Group: Individual Therapy  [place], PT 03/20/2019, 4:28 PM

## 2019-03-20 NOTE — Accreditation Note (Cosign Needed)
I spoke with Daughter Arline Asp this am to explain EGD. She asked that we call after procedure to discuss results. I reviewed Endoscopic findings with Arline Asp and explained that patient would be on acid blocker for next several weeks, if not longer, to promote ulcer healing

## 2019-03-20 NOTE — Progress Notes (Signed)
[place] PHYSICAL MEDICINE & REHABILITATION PROGRESS NOTE   Subjective/Complaints: Patient seen laying in bed this morning.  She states she slept well overnight.  Discussed with nursing and therapies.  Discussed with SLP and PA dietary recommendations regarding NG tube.  ROS: Limited due to cognition, but appears to deny CP, shortness of breath, nausea, vomiting, diarrhea.  Objective:   No results found. Recent Labs    03/19/19 1249  WBC 13.8*  HGB 8.2*  HCT 25.2*  PLT 618*   Recent Labs    03/19/19 1249  NA 130*  K 4.0  CL 93*  CO2 24  GLUCOSE 129*  BUN 17  CREATININE 0.63  CALCIUM 8.3*    Intake/Output Summary (Last 24 hours) at 03/20/2019 1050 Last data filed at 03/20/2019 0600 Gross per 24 hour  Intake -  Output 800 ml  Net -800 ml     Physical Exam: Vital Signs Blood pressure 131/64, pulse 80, temperature 98 F (36.7 C), temperature source Oral, resp. rate 15, height  (1.651 m), weight 68.5 kg, SpO2 98 %. Constitutional: No distress . Vital signs reviewed. HENT: Normocephalic.  Atraumatic.  Eyes: EOMI.  No discharge. Cardiovascular: No JVD. Respiratory: Normal effort. GI: Non-distended. Musc: Right hip TTP, stable. Neurologic: Alert hiatuses Dr. Ricky Stabs speaking in good area sugar and and oriented x3 Motor:  Left upper extremity: Shoulder abduction 3/5, distally 4+/5, unchanged Left lower extremity: 4+/5 proximal to distal, unchanged Right lower extremity: 2/5 hip flexion (pain inhibition), knee extension, ankle dorsiflexion, (variable with participation), stable Skin: Warm and dry.  Intact.  Assessment/Plan: 1. Functional deficits secondary to right corona radiata infarct which require 3+ hours per day of interdisciplinary therapy in a comprehensive inpatient rehab setting.  Physiatrist is providing close team supervision and 24 hour management of active medical problems listed below.  Physiatrist and rehab team continue to assess  barriers to discharge/monitor patient progress toward functional and medical goals  Care Tool:  Bathing  Bathing activity did not occur: Safety/medical concerns Body parts bathed by patient: Left arm, Chest, Abdomen, Front perineal area, Right upper leg, Left upper leg   Body parts bathed by helper: Right arm, Buttocks, Right lower leg, Left lower leg     Bathing assist Assist Level: Moderate Assistance - Patient 50 - 74%     Upper Body Dressing/Undressing Upper body dressing   What is the patient wearing?: Pull over shirt    Upper body assist Assist Level: Maximal Assistance - Patient 25 - 49%    Lower Body Dressing/Undressing Lower body dressing      What is the patient wearing?: Pants, Incontinence brief     Lower body assist Assist for lower body dressing: Total Assistance - Patient < 25%     Toileting Toileting    Toileting assist Assist for toileting: Dependent - Patient 0%     Transfers Chair/bed transfer  Transfers assist     Chair/bed transfer assist level: Maximal Assistance - Patient 25 - 49%     Locomotion Ambulation   Ambulation assist   Ambulation activity did not occur: Safety/medical concerns(fatigue/pain )  Assist level: Moderate Assistance - Patient 50 - 74% Assistive device: Walker-rolling(and L hand splint) Max distance: 5'   Walk 10 feet activity   Assist  Walk 10 feet activity did not occur: Safety/medical concerns(fatigue/pain )        Walk 50 feet activity   Assist Walk 50 feet with 2 turns activity did not occur: Safety/medical concerns(fatigue/pain )  Walk 150 feet activity   Assist Walk 150 feet activity did not occur: Safety/medical concerns(fatigue/pain )         Walk 10 feet on uneven surface  activity   Assist Walk 10 feet on uneven surfaces activity did not occur: Safety/medical concerns(fatigue/pain )         Wheelchair     Assist Will patient use wheelchair at discharge?:  Yes Type of Wheelchair: Manual    Wheelchair assist level: Dependent - Patient 0% Max wheelchair distance: 26'    Wheelchair 50 feet with 2 turns activity    Assist    Wheelchair 50 feet with 2 turns activity did not occur: Safety/medical concerns(fatigue/pain )   Assist Level: Dependent - Patient 0%   Wheelchair 150 feet activity     Assist Wheelchair 150 feet activity did not occur: Safety/medical concerns(fatigue/pain )   Assist Level: Dependent - Patient 0%    Medical Problem List and Plan: 1.Left side weaknesssecondary to 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  Cont CIR Plan outpatient coiling of aneurysm per interventional radiology  Will consider anti-spasticity medications if necessary, not appropriate at this point  CT head reviewed, unremarkable for acute intracranial process, but showing signs of microvascular changes  Discussed  2. Antithrombotics: -DVT/anticoagulation:SCDs.  Lower extremity Dopplers negative DVT -antiplatelet therapy: aspirin 81 mg daily Plavix 75 mg daily 3 weeks and Plavix alone 3. Pain Management/migraine headaches:Tramadol and Flexeril DC'd  Added kpad for low back  Muscle rub ordered for left calf and right thigh- improved 4. Mood:Provide emotional support -antipsychotic agents: N/A 5. Neuropsych/vascular dementia: This patientis not capable of making decisions on herown behalf.  Cont tele-sitter for safety  Needs significant encouragement 6. Skin/Wound Care:Routine skin checks 7. Fluids/Electrolytes/Nutrition:Routine in and outs  D3 thins, advance diet as tolerated 8. History of traumatic [facility] May 2018. Patient seen in the past by Dr. Conchita Paris 9. Hypertension. Vitals:   03/19/19 1917 03/20/19 0543  BP: 132/73 131/64  Pulse: 75 80  Resp: 15 15   Temp: 98 F (36.7 C) 98 F (36.7 C)  SpO2: 100% 98%   Norvasc 10 mg daily, decreased to 5 mg on 4/26, DC'd on 5/14 due to daughter's concern of side effects  Losartan 12.5 daily started on 5/16 (daughter states she personally is able to tolerate this), increased to 25 on 5/19  Improving on 5/19 10. Hyperlipidemia. Lipitor 11. Tobacco abuse. Counseling 12.  Hyperglycemia  Hemoglobin A1c 5.5 on 01/2019  P.o. intake remains inconsistent on 5/18  Patient needs significant time and cueing to encourage intake. 13.  Acute blood loss anemia  Hemoccult ordered and positive, repeat negative, positive again on 5/11  Hgb 8.2 on 5/18  Added Fe++ supp  GI consulted, plan for EGD - will need to monitor closely given confusion post anesthesia 14.  Hypoalbuminemia  Supplement initiated on 4/27 15.  Slow transit constipation  Bowel regimen increased on 5/3, increased again on 5/4  Improving 16.  Confusion-multifactorial  Vascular dementia, confirmed by daughter  WBCs 13.8 on 5/18  Improving overall  Urine culture with MRSA  Completed Macrobid started on 5/6-5/13  Repeat UA relatively unremarkable, repeat urine culture no growth  CT chest reviewed showing emphysema with scattered pulmonary nodules with outpatient follow-up scan.  Ammonia within normal limits  Lipitor, amlodipine DC'd per daughter request 52.  Hip OA  Bilaterally noted on x-ray,  personally reviewed.  Voltaren gel ordered with improvement 18.  Emphysema with scattered pulmonary nodules  Repeat chest CT as outpatient  Noted on CT  ABG reviewed showing decreased PO2  Supplemental oxygen ordered 19.  Transaminitis  LFTs elevated, but improving on 5/18  Lipitor DC'd 20.  Hyponatremia  Sodium 130 on 5/18  Continue to monitor 21.  Tobacco abuse  Patient heavy smoker at baseline  Nicotine patch ordered on 5/14  >35 minutes spent with patient and daughter discussing aforementioned  LOS: 25 days A FACE TO FACE EVALUATION WAS  PERFORMED  Quadre Bristol Karis Jubanil Alben Jepsen 03/20/2019, 10:50 AM

## 2019-03-20 NOTE — Progress Notes (Signed)
Speech Language Pathology Daily Session Note  Patient Details  Name: Connie Oconnor MRN: [id number] Date of Birth: [birth year]  Today's Date: 03/20/2019 SLP Individual Time: 1856-3149 SLP Individual Time Calculation (min): 45 min  Short Term Goals: Week 4: SLP Short Term Goal 1 (Week 4): Patient will orient to place, situation and month with Mod A multimodal cues.  SLP Short Term Goal 2 (Week 4): Patient will demonstrate sustained attention to tasks for ~5 minutes with Mod A verbal cues for redirection.  SLP Short Term Goal 3 (Week 4): Patient will initiate tasks with Mod A verbal cues.  SLP Short Term Goal 4 (Week 4): Patient will demonstrate functional problem solving with Max A mulitmodal cues.   Skilled Therapeutic Interventions: Skilled treatment session focused on cognitive goals. Upon arrival, patient was awake but appeared lethargic. Patient requesting water but was unable to provide due to being NPO for procedure. However, RN reported SLP was able to provide moistened swabs. SLP attempted to reposition patient in bed but patient unable to move her legs or sit up in bed due to pain with requests "to wait a minute." Patient unable to recall events from the morning and required total A for recall of procedure. Patient required extra time for initiation this session. Patient left reclined in bed with alarm on and all needs within reach. Continue with current plan of care.      Pain Pain Assessment Pain Scale: 0-10 Pain Score: 0-No pain  Therapy/Group: Individual Therapy  Maninder Deboer 03/20/2019, 3:08 PM

## 2019-03-20 NOTE — Interval H&P Note (Signed)
History and Physical Interval Note:  03/20/2019 12:28 PM  Connie Oconnor  has presented today for surgery, with the diagnosis of heme positive stools, anemia.  The various methods of treatment have been discussed with the patient and family. After consideration of risks, benefits and other options for treatment, the patient has consented to  Procedure(s): ESOPHAGOGASTRODUODENOSCOPY (EGD) (N/A) as a surgical intervention.  The patient's history has been reviewed, patient examined, no change in status, stable for surgery.  I have reviewed the patient's chart and labs.  Questions were answered to the patient's satisfaction.     Tressia Danas

## 2019-03-20 NOTE — Op Note (Signed)
[facility] Patient Name: Connie Oconnor Procedure Date : 03/20/2019 MRN: [id number] Attending MD: Tressia Danas MD, MD Date of Birth: [birth year] CSN: [id number] Age: 78 Admit Type: Inpatient Procedure:                Upper GI endoscopy Indications:              Heme positive stool. Exam was performed on Plavix. Providers:                Tressia Danas MD, MD, Dwain Sarna, RN,                            Kandice Robinsons, Technician, Matthew Folks,                            Technician, Rise Patience, CRNA Referring MD:              Medicines:                See the Anesthesia note for documentation of the                            administered medications Complications:            No immediate complications. Estimated Blood Loss:     Estimated blood loss: none. Procedure:                Pre-Anesthesia Assessment:                           - Prior to the procedure, a History and Physical                            was performed, and patient medications and                            allergies were reviewed. The patient's tolerance of                            previous anesthesia was also reviewed. The risks                            and benefits of the procedure and the sedation                            options and risks were discussed with the patient.                            All questions were answered, and informed consent                            was obtained. Prior Anticoagulants: The patient has                            taken Plavix (clopidogrel), last dose was day of  procedure. ASA Grade Assessment: III - A patient                            with severe systemic disease. After reviewing the                            risks and benefits, the patient was deemed in                            satisfactory condition to undergo the procedure.                           After obtaining informed consent, the endoscope was                       passed under direct vision. Throughout the                            procedure, the patient's blood pressure, pulse, and                            oxygen saturations were monitored continuously. The                            GIF-H190 ([id number]) Olympus gastroscope was                            introduced through the mouth, and advanced to the                            second part of duodenum. The upper GI endoscopy was                            accomplished without difficulty. The patient                            tolerated the procedure well. Scope In: Scope Out: Findings:      LA Grade B (one or more mucosal breaks greater than 5 mm, not extending       between the tops of two mucosal folds) esophagitis with no bleeding was       found.      A small hiatal hernia was present. No Cameron's erosions seen.      One non-bleeding cratered gastric ulcer with no stigmata of bleeding was       found in the gastric antrum. The lesion was 4 mm in largest dimension.      Multiple, small non-bleeding erosions were found in the gastric antrum.       There were no stigmata of recent bleeding. No evidence for recent       bleeding in the stomach.      The examined duodenum was normal. No blood in the small intestines.      The exam was otherwise without abnormality. Impression:               - LA Grade B reflux esophagitis.                           -  Small hiatal hernia without Cameron's erosions.                           - Non-bleeding gastric ulcer with no stigmata of                            bleeding.                           - Non-bleeding erosive gastropathy.                           - Normal examined duodenum.                           - The examination was otherwise normal.                           - No specimens collected. Recommendation:           - Advance diet as tolerated today.                           - Continue present medications. Increase                             pantoprazole to 40 mg BID x 8 weeks.                           - Avoid NSAIDs as able.                           - Continue serial hgb/hct with transfusion as                            indicated.                           - Gastric ulcer and esophagitis may be the cause of                            the hemoccult + stools and anemia. Would                            recommended outpatient colonoscopy when it is safe                            to hold her Plavix.                           - No additional inpatient GI evaluation planned at                            this time. Procedure Code(s):        --- Professional ---  [id number], Esophagogastroduodenoscopy, flexible,                            transoral; diagnostic, including collection of                            specimen(s) by brushing or washing, when performed                            (separate procedure) Diagnosis Code(s):        --- Professional ---                           K21.0, Gastro-esophageal reflux disease with                            esophagitis                           K44.9, Diaphragmatic hernia without obstruction or                            gangrene                           K25.9, Gastric ulcer, unspecified as acute or                            chronic, without hemorrhage or perforation                           K31.89, Other diseases of stomach and duodenum CPT copyright 2019 [employer]. All rights reserved. The codes documented in this report are preliminary and upon coder review may  be revised to meet current compliance requirements. Tressia DanasKimberly Lecretia Buczek MD, MD 03/20/2019 12:56:26 PM This report has been signed electronically. Number of Addenda: 0

## 2019-03-21 ENCOUNTER — Encounter (HOSPITAL_COMMUNITY): Payer: Self-pay | Admitting: Gastroenterology

## 2019-03-21 ENCOUNTER — Inpatient Hospital Stay (HOSPITAL_COMMUNITY): Payer: Medicare Other | Admitting: Speech Pathology

## 2019-03-21 ENCOUNTER — Inpatient Hospital Stay (HOSPITAL_COMMUNITY): Payer: Medicare Other

## 2019-03-21 DIAGNOSIS — K209 Esophagitis, unspecified: Secondary | ICD-10-CM

## 2019-03-21 LAB — MRSA PCR SCREENING: MRSA by PCR: POSITIVE — AB

## 2019-03-21 MED ORDER — PANTOPRAZOLE SODIUM 40 MG PO TBEC
40.0000 mg | DELAYED_RELEASE_TABLET | Freq: Two times a day (BID) | ORAL | Status: DC
  Administered 2019-03-21 – 2019-03-27 (×12): 40 mg via ORAL
  Filled 2019-03-21 (×12): qty 1

## 2019-03-21 NOTE — Progress Notes (Signed)
[place] PHYSICAL MEDICINE & REHABILITATION PROGRESS NOTE   Subjective/Complaints: Patient seen laying in bed this morning.  She states she slept well overnight.  Discussed ulcer with nursing. She had an EGD yesterday, report reviewed.   ROS: Limited due to cognition, but appears to deny CP, shortness of breath, nausea, vomiting, diarrhea.  Objective:   No results found. Recent Labs    03/19/19 1249  WBC 13.8*  HGB 8.2*  HCT 25.2*  PLT 618*   Recent Labs    03/19/19 1249  NA 130*  K 4.0  CL 93*  CO2 24  GLUCOSE 129*  BUN 17  CREATININE 0.63  CALCIUM 8.3*    Intake/Output Summary (Last 24 hours) at 03/21/2019 1243 Last data filed at 03/21/2019 0930 Gross per 24 hour  Intake 620 ml  Output 650 ml  Net -30 ml     Physical Exam: Vital Signs Blood pressure (!) 130/43, pulse 80, temperature 98 F (36.7 C), temperature source Oral, resp. rate 15, height 5\' 5"  (1.651 m), weight 69.2 kg, SpO2 97 %. Constitutional: No distress . Vital signs reviewed. HENT: Normocephalic.  Atraumatic.  Eyes: EOMI.  No discharge. Cardiovascular: No JVD. Respiratory: Normal effort. GI: Non-distended. Musc: Right hip TTP, unchanged. Neurologic: Alert and oriented x3 Motor:  Left upper extremity: Shoulder abduction 3/5, distally 4+/5, stable Left lower extremity: 4+/5 proximal to distal, stable Right lower extremity: 2/5 hip flexion (pain inhibition), knee extension, ankle dorsiflexion, (variable with participation), stable Skin: Warm and dry.  Intact.  Assessment/Plan: 1. Functional deficits secondary to right corona radiata infarct which require 3+ hours per day of interdisciplinary therapy in a comprehensive inpatient rehab setting.  Physiatrist is providing close team supervision and 24 hour management of active medical problems listed below.  Physiatrist and rehab team continue to assess barriers to discharge/monitor patient progress toward functional and medical goals  Care  Tool:  Bathing  Bathing activity did not occur: Safety/medical concerns Body parts bathed by patient: Left arm, Chest, Abdomen, Front perineal area, Right upper leg, Left upper leg   Body parts bathed by helper: Right arm, Buttocks, Right lower leg, Left lower leg     Bathing assist Assist Level: Moderate Assistance - Patient 50 - 74%     Upper Body Dressing/Undressing Upper body dressing   What is the patient wearing?: Pull over shirt    Upper body assist Assist Level: Maximal Assistance - Patient 25 - 49%    Lower Body Dressing/Undressing Lower body dressing      What is the patient wearing?: Pants, Incontinence brief     Lower body assist Assist for lower body dressing: Total Assistance - Patient < 25%     Toileting Toileting    Toileting assist Assist for toileting: Dependent - Patient 0%     Transfers Chair/bed transfer  Transfers assist     Chair/bed transfer assist level: 2 Helpers     Locomotion Ambulation   Ambulation assist   Ambulation activity did not occur: Safety/medical concerns(fatigue/pain )  Assist level: Moderate Assistance - Patient 50 - 74% Assistive device: Walker-rolling(and L hand splint) Max distance: 5'   Walk 10 feet activity   Assist  Walk 10 feet activity did not occur: Safety/medical concerns(fatigue/pain )        Walk 50 feet activity   Assist Walk 50 feet with 2 turns activity did not occur: Safety/medical concerns(fatigue/pain )         Walk 150 feet activity   Assist Walk 150 feet  activity did not occur: Safety/medical concerns(fatigue/pain )         Walk 10 feet on uneven surface  activity   Assist Walk 10 feet on uneven surfaces activity did not occur: Safety/medical concerns(fatigue/pain )         Wheelchair     Assist Will patient use wheelchair at discharge?: Yes Type of Wheelchair: Manual    Wheelchair assist level: Dependent - Patient 0% Max wheelchair distance: 71'     Wheelchair 50 feet with 2 turns activity    Assist    Wheelchair 50 feet with 2 turns activity did not occur: Safety/medical concerns(fatigue/pain )   Assist Level: Dependent - Patient 0%   Wheelchair 150 feet activity     Assist Wheelchair 150 feet activity did not occur: Safety/medical concerns(fatigue/pain )   Assist Level: Dependent - Patient 0%    Medical Problem List and Plan: 1.Left side weaknesssecondary to 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  Cont CIR Plan outpatient coiling of aneurysm per interventional radiology  Will consider anti-spasticity medications if necessary, not appropriate at this point  CT head reviewed, unremarkable for acute intracranial process, but showing signs of microvascular changes  Team conference today to discuss current and goals and coordination of care, home and environmental barriers, and discharge planning with nursing, case manager, and therapies.  2. Antithrombotics: -DVT/anticoagulation:SCDs.  Lower extremity Dopplers negative DVT -antiplatelet therapy: aspirin 81 mg daily Plavix 75 mg daily 3 weeks and Plavix alone 3. Pain Management/migraine headaches:Tramadol and Flexeril DC'd  Added kpad for low back  Muscle rub ordered for left calf and right thigh- improved 4. Mood:Provide emotional support -antipsychotic agents: N/A 5. Neuropsych/vascular dementia: This patientis not capable of making decisions on herown behalf.  Cont tele-sitter for safety  Needs significant encouragement 6. Skin/Wound Care:Routine skin checks 7. Fluids/Electrolytes/Nutrition:Routine in and outs  D3 thins, advance diet as tolerated 8. History of traumatic [facility] May 2018. Patient seen in the past by Dr. Conchita Paris 9. Hypertension. Vitals:   03/20/19 2015 03/21/19 0352   BP: 132/84 (!) 130/43  Pulse: 80 80  Resp: 15 15  Temp: 98 F (36.7 C) 98 F (36.7 C)  SpO2: 100% 97%   Norvasc 10 mg daily, decreased to 5 mg on 4/26, DC'd on 5/14 due to daughter's concern of side effects  Losartan 12.5 daily started on 5/16 (daughter states she personally is able to tolerate this), increased to 25 on 5/19  Relatively controlled on 5/20 10. Hyperlipidemia. Lipitor 11. Tobacco abuse. Counseling 12.  Hyperglycemia  Hemoglobin A1c 5.5 on 01/2019  P.o. intake remains inconsistent on 5/18  Patient needs significant time and cueing to encourage intake. 13.  Acute blood loss anemia  Hemoccult ordered and positive, repeat negative, positive again on 5/11  Hgb 8.2 on 5/18, labs ordered for tomorrow  Added Fe++ supp  GI consulted, appreciate recs and EGD- nonbleeding gastric ulcer and erosive gastropathy, recommended PPI twice daily x8 weeks then daily.  Recommended Carafate 1 g 4 times daily for 2 weeks. 14.  Hypoalbuminemia  Supplement initiated on 4/27 15.  Slow transit constipation  Bowel regimen increased on 5/3, increased again on 5/4  Improving 16.  Confusion-multifactorial  Vascular dementia, confirmed by daughter  WBCs 13.8 on 5/18, labs ordered for tomorrow  Improving overall  Urine culture with MRSA  Completed Macrobid started on 5/6-5/13  Repeat UA relatively unremarkable,  repeat urine culture no growth  CT chest reviewed showing emphysema with scattered pulmonary nodules with outpatient follow-up scan.  Ammonia within normal limits  Lipitor, amlodipine DC'd per daughter request 34.  Hip OA  Bilaterally noted on x-ray, personally reviewed.  Voltaren gel ordered with improvement 18.  Emphysema with scattered pulmonary nodules  Repeat chest CT as outpatient  Noted on CT  ABG reviewed showing decreased PO2  Supplemental oxygen ordered 19.  Transaminitis  LFTs elevated, but improving on 5/18  Lipitor DC'd 20.  Hyponatremia  Sodium 130 on  5/18  Continue to monitor 21.  Tobacco abuse  Patient heavy smoker at baseline  Nicotine patch ordered on 5/14  LOS: 26 days A FACE TO FACE EVALUATION WAS PERFORMED   Karis Juba 03/21/2019, 12:43 PM

## 2019-03-21 NOTE — Patient Care Conference (Signed)
Inpatient RehabilitationTeam Conference and Plan of Care Update Date: 03/21/2019   Time: 1:30 PM    Patient Name: Connie MarusRebecca Yokoyama      Medical Record Number: [id number]  Date of Birth: September 24, 1941 Sex: Female         Room/Bed: 5N20C/5N20C-01 Payor Info: Payor: Advertising copywriter[employer] HEALTHCARE MEDICARE / Plan: [employer] MEDICARE / Product Type: *No Product type* /    Admitting Diagnosis: NT Team Rt CVA; 22-24days  Admit Date/Time:  02/23/2019  5:02 PM Admission Comments: No comment available   Primary Diagnosis:  <principal problem not specified> Principal Problem: <principal problem not specified>  Patient Active Problem List   Diagnosis Date Noted  . 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 Gastric erosion   . Gastrointestinal hemorrhage   . Acute respiratory failure with hypoxia (HCC)   . Hyponatremia   . Tobacco abuse   . Pulmonary emphysema (HCC)   . Pulmonary nodules   . Primary osteoarthritis of right hip   . Subacute confusional state   . Lethargy   . Confusion, postoperative   . Acute lower UTI   . Leukocytosis   . Slow transit constipation   . Pain of left calf   . Impulsiveness   . Dysphagia, post-stroke   . Labile blood pressure   . Hyperglycemia   . Hypotension   . Hypoalbuminemia due to protein-calorie malnutrition (HCC)   . Acute blood loss anemia   . Hypoglycemia   . Essential hypertension   . History of traumatic brain injury   . Lacunar infarct, acute (HCC) 02/23/2019  . 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 Hypothyroidism (acquired) 02/20/2019  . Closed fracture of medial portion of right tibial plateau 01/11/2018  . Acute medial meniscal tear, right, initial encounter 01/11/2018  . SAH (subarachnoid hemorrhage) (HCC) 03/29/2017    Expected Discharge Date: Expected Discharge Date: (SNF)  Team Members Present: Physician leading conference: Dr. 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    Improve mobility, safety, BP, confusion, nutriotional intake  See above   Bowel/Bladder   i/o cath q8hrs with episodes of incontinences; LBM: 05/17  Encourage timed toileting; encourge prn laxatives and keep educating of refused scheduled laxatives  assist with tolieting need prn    Swallow/Nutrition/ Hydration   Dys. 3 textures with thin liquids, Min-Mod A  Min A  decrease amount of oral resiude    ADL's   Mod A bathing bed level, max A dressing, max A- max +2 for transfers. Decreased awareness, initiation. Improved cognition over last several days, however still not progressing functionally   downgraded toileting tasks to max A, dressing/bathing to mod A, balance at mod A  ADL transfers, siting balance, LUE NMR, cognitive retaining, ADL retraining   Mobility   max A bed mobility due to bilat hip pain, mod to max A squat pivot transfers, max A to stand at wall rail  D/c goal for w/c mobility, mod A transfers, bed mobility and min a sitting balance  will  try slide board transfers, sitting balance, transfers with stedy for nursing    Communication             Safety/Cognition/ Behavioral Observations  Mod-Max A   Mod A   attention, problem solving, recall    Pain   c/o pain to R hip, scheduled voltaren gel   <2/10 pain  assess pain QS and prn    Skin   no skin issues  Maintain skin integrity and prevent skin breakdown.  assess skin QS and prn      *See Care Plan and progress notes for  long and short-term goals.     Barriers to Discharge  Current Status/Progress Possible Resolutions Date Resolved   Physician    Medical stability;Decreased caregiver support;Lack of/limited family support     See above  Therapies, follow vitals, recs per GI      Nursing                  PT                    OT                  SLP                SW                Discharge Planning/Teaching Needs:  Pursuing NHP daughter's are unable to provide 24 hr phsycial care at this time.      Team Discussion:  GI scope this week found ulcer and placed on meds. More alert and cognition improving. Still requiring mod level of assist. Pain in leg from arthritis and back issues. Speech inconsistent with. PT now using transfer board. UTI treated and medically stable for transfer to NH.   Revisions to Treatment Plan:  Downgraded her goals to mod level    Continued Need for Acute Rehabilitation Level of Care: The patient requires daily medical management by a physician with specialized training in physical medicine and rehabilitation for the following conditions: Daily direction of a multidisciplinary physical rehabilitation program to ensure safe treatment while eliciting the highest outcome that is of practical value to the patient.: Yes Daily medical management of patient stability for increased activity during participation in an intensive rehabilitation regime.: Yes Daily analysis of laboratory values and/or radiology reports with any subsequent need for medication adjustment of medical intervention for : Neurological problems;Diabetes problems;Blood pressure problems;Other   I attest that I was present, lead the team conference, and concur with the assessment and plan of the team. Teleconference held due to COVID 19   Kaynen Minner, Cladie Ramberg 03/21/2019, 4:04 PM

## 2019-03-21 NOTE — Progress Notes (Signed)
Occupational Therapy Session Note  Patient Details  Name: Connie Oconnor MRN: [id number] Date of Birth: [birth year]  Today's Date: 03/21/2019 OT Individual Time: 1400-1454 OT Individual Time Calculation (min): 54 min    Short Term Goals: Week 4:  OT Short Term Goal 1 (Week 4): Continue working toward established LTG  Skilled Therapeutic Interventions/Progress Updates:    Pt received supine with c/o pain in B hips. Pt agreeable to OOB mobility. Pt completed bed mobility with max A, however improvements in initiation and pain management. Pt sat EOB with mod-max A, max cueing for core engagement and use of UE support. Pt completed squat pivot transfer to TIS w/c with total 1 person assist. Pt was tilted back and assisted in scooting hips back in chair. Pt placed in front of large window in room to increase orientation- pt initially stating "is it 2 in the morning?". Following environmental and questioning cue, pt remained oriented for remainder of session. Pt sat in front of window and ate spaghetti and ice cream with mod A. Cueing for R tongue sweep d/t pocketing. Pt left sitting up in TIS with chair alarm belt fastened and telesitter in room. RN Miranda alerted to pt position sitting up and need to be put back to bed in 1 hour, as well as need for NT's to increase PO intake with total A feeding.   Therapy Documentation Precautions:  Precautions Precautions: Fall Precaution Comments: L hemi, back pain, needs time to initiate movements  Restrictions Weight Bearing Restrictions: No   Therapy/Group: Individual Therapy  Crissie Reese 03/21/2019, 2:58 PM

## 2019-03-21 NOTE — Progress Notes (Signed)
Speech Language Pathology Daily Session Note  Patient Details  Name: Connie Oconnor MRN: [id number] Date of Birth: 12-Apr-1941  Today's Date: 03/21/2019 SLP Individual Time: 5248-1859 SLP Individual Time Calculation (min): 25 min and Today's Date: 03/21/2019 SLP Missed Time: 35 Minutes Missed Time Reason: Patient fatigue  Short Term Goals: Week 4: SLP Short Term Goal 1 (Week 4): Patient will orient to place, situation and month with Mod A multimodal cues.  SLP Short Term Goal 2 (Week 4): Patient will demonstrate sustained attention to tasks for ~5 minutes with Mod A verbal cues for redirection.  SLP Short Term Goal 3 (Week 4): Patient will initiate tasks with Mod A verbal cues.  SLP Short Term Goal 4 (Week 4): Patient will demonstrate functional problem solving with Max A mulitmodal cues.   Skilled Therapeutic Interventions: Skilled treatment session focused on cognitive goals. Patient received from PT sitting upright in the wheelchair consuming her breakfast meal of Dys. 3 textures with thin liquids. SLP facilitated session by providing constant verbal cues and overall Max A verbal cues for initiation and sustained attention to self-feeding due to fatigue. Patient was falling asleep with food in her mouth and while attempting to bring the cup to her mouth with patient eventually dropping the cup. SLP ended meal due to safety concerns and a moderate amount of oral residue was suctioned from her oral cavity. Session ended early due to patient's fatigue and lethargy, RN aware.  Patient left reclined in tilt-in-space wheelchair with alarm on and all needs within reach. Continue with current plan of care.      Pain Pain Assessment Faces Pain Scale: Hurts even more Pain Type: Chronic pain Pain Location: Hip Pain Orientation: Right;Left Pain Descriptors / Indicators: Grimacing;Guarding;Moaning Pain Intervention(s): Repositioned;Rest  Therapy/Group: Individual Therapy  Zella Dewan,  Halayna Blane 03/21/2019, 1:13 PM

## 2019-03-21 NOTE — Progress Notes (Signed)
     Progress Note    ASSESSMENT AND PLAN:   1. [place] anemia / FOBT+ on ASA + Plavix.  Baseline hgb ~14 >>>>> 8.2 this admission. Tarry on iron. Hgb stable ~ 8.2 (no transfusion). EGD yesterday >> LA Grade B reflux esophagitis / non-bleeding gastric ulcer / erosive gastropathy -avoid NSAIDs if possible.  -Increasing PPI to BID x 8 weeks then daily (reflux esophagitis) .  -Eventual outpatient colonoscopy (when able to hold Plavix)   2. 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 Has left sided weakness. On Plavix + ASA  SUBJECTIVE        OBJECTIVE:     Vital signs in last 24 hours: Temp:  [97.7 F (36.5 C)-98.8 F (37.1 C)] 98 F (36.7 C) (05/20 0352) Pulse Rate:  [70-90] 80 (05/20 0352) Resp:  [15-24] 15 (05/20 0352) BP: (106-144)/(42-84) 130/43 (05/20 0352) SpO2:  [96 %-100 %] 97 % (05/20 0352) Weight:  [68.5 kg-69.2 kg] 69.2 kg (05/20 0352) Last BM Date: 03/18/19 General:   Alert, well-developed female in NAD EENT:  Normal hearing, non icteric sclera, conjunctive pink.  Heart:  Regular rate and rhythm.  No lower extremity edema   Pulm: Normal respiratory effort Abdomen:  Soft, nondistended, nontender.  Normal bowel . Psych:  Pleasant, cooperative.  Normal mood and affect.   Intake/Output from previous day: 05/19 0701 - 05/20 0700 In: 500 [P.O.:150; I.V.:350] Out: 650 [Urine:650] Intake/Output this shift: No intake/output data recorded.  Lab Results: Recent Labs    03/19/19 1249  WBC 13.8*  HGB 8.2*  HCT 25.2*  PLT 618*   BMET Recent Labs    03/19/19 1249  NA 130*  K 4.0  CL 93*  CO2 24  GLUCOSE 129*  BUN 17  CREATININE 0.63  CALCIUM 8.3*   LFT Recent Labs    03/19/19 1249  PROT 5.9*  ALBUMIN 1.4*  AST 44*  ALT 75*  ALKPHOS 71  BILITOT 0.4   PT/INR No results for input(s): LABPROT, INR in the last 72 hours. Hepatitis Panel No results for input(s): HEPBSAG, HCVAB, HEPAIGM, HEPBIGM in the last 72 hours.  No  results found.    Active Problems:   Lacunar infarct, acute (HCC)   Hypoalbuminemia due to protein-calorie malnutrition (HCC)   Acute blood loss anemia   Hypoglycemia   Essential hypertension   History of traumatic brain injury   Hyperglycemia   Hypotension   Labile blood pressure   Dysphagia, post-stroke   Impulsiveness   Slow transit constipation   Pain of left calf   Leukocytosis   Acute lower UTI   Lethargy   Confusion, postoperative   Pulmonary emphysema (HCC)   Pulmonary nodules   Primary osteoarthritis of right hip   Subacute confusional state   Acute respiratory failure with hypoxia (HCC)   Hyponatremia   Tobacco abuse   Gastrointestinal hemorrhage   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

## 2019-03-21 NOTE — Progress Notes (Signed)
Social Work Patient ID: Connie Oconnor, female   DOB: Jan 08, 1941, 78 y.o.   MRN: [id number] Spoke with [name]-daughter via telephone to inform of team conference progress and she was aware of the GI issues and results of the scope Mom had. Discussed she is ready now to transition to the next level-NH. Both daughter's and pt would like now to go to [facility]. Will contact them since they had offered her a bed but then she had medical issues. Will work on NH offer.

## 2019-03-21 NOTE — Progress Notes (Signed)
Physical Therapy Session Note  Patient Details  Name: Connie Oconnor MRN: [id number] Date of Birth: [birth year]  Today's Date: 03/21/2019 PT Individual Time: 0836-0930 PT Individual Time Calculation (min): 54 min   Short Term Goals: Week 4:  PT Short Term Goal 1 (Week 4): Patient will demonstrate slide board transfers with mod to max A +1 PT Short Term Goal 2 (Week 4): Patient will demonstrate sitting balance with min A at least 50% of the time to allow for functional sitting task EOB. PT Short Term Goal 3 (Week 4): Patient to perform sit to stand with Stedy with mod A +2 to ease toilet transfers  Skilled Therapeutic Interventions/Progress Updates:    Patient in supine and awake, ready to get up and go out for breakfast at "the big window", after she gets some coffee first.  Patient sipped on coffee from straw with HOB elevated while getting THT donned with total A.  Patient assisted moving legs closer to EOB and the total A to lift trunk, pt assisted with getting legs off bed and scooting to EOB with support from behind and max cues.  Patient seated EOB with close S while getting hair combed.  Squat pivot transfer to w/c on L with max A to edge of chair, A+2 to scoot back for safety due to chair moving.  Patient pushed in chair out to hallway to eat breakfast with max cues, intermittent assist and encouragement for focus, getting more food in and only ate two bites of french toast and two bites of sausage in 20 minutes.  Encouraged to continue and try to eat all magic cup for protein.  Handoff verbally to NT to assist and supervise, then noted SLP with pt for her session.    Therapy Documentation Precautions:  Precautions Precautions: Fall Precaution Comments: L hemi, back pain, needs time to initiate movements  Restrictions Weight Bearing Restrictions: No Pain: Pain Assessment Faces Pain Scale: Hurts even more Pain Type: Chronic pain Pain Location: Hip Pain Orientation: Right;Left Pain  Descriptors / Indicators: Grimacing;Guarding;Moaning Pain Intervention(s): Repositioned;Rest    Therapy/Group: Individual Therapy  Elray Mcgregor  [name], PT 03/21/2019, 12:18 PM

## 2019-03-22 ENCOUNTER — Inpatient Hospital Stay (HOSPITAL_COMMUNITY): Payer: Medicare Other

## 2019-03-22 ENCOUNTER — Inpatient Hospital Stay (HOSPITAL_COMMUNITY): Payer: Medicare Other | Admitting: Speech Pathology

## 2019-03-22 LAB — CBC WITH DIFFERENTIAL/PLATELET
Abs Immature Granulocytes: 0.08 10*3/uL — ABNORMAL HIGH (ref 0.00–0.07)
Basophils Absolute: 0 10*3/uL (ref 0.0–0.1)
Basophils Relative: 0 %
Eosinophils Absolute: 0 10*3/uL (ref 0.0–0.5)
Eosinophils Relative: 0 %
HCT: 24 % — ABNORMAL LOW (ref 36.0–46.0)
Hemoglobin: 7.9 g/dL — ABNORMAL LOW (ref 12.0–15.0)
Immature Granulocytes: 1 %
Lymphocytes Relative: 7 %
Lymphs Abs: 1 10*3/uL (ref 0.7–4.0)
MCH: 26.9 pg (ref 26.0–34.0)
MCHC: 32.9 g/dL (ref 30.0–36.0)
MCV: 81.6 fL (ref 80.0–100.0)
Monocytes Absolute: 1 10*3/uL (ref 0.1–1.0)
Monocytes Relative: 8 %
Neutro Abs: 11.3 10*3/uL — ABNORMAL HIGH (ref 1.7–7.7)
Neutrophils Relative %: 84 %
Platelets: 689 10*3/uL — ABNORMAL HIGH (ref 150–400)
RBC: 2.94 MIL/uL — ABNORMAL LOW (ref 3.87–5.11)
RDW: 14.1 % (ref 11.5–15.5)
WBC: 13.4 10*3/uL — ABNORMAL HIGH (ref 4.0–10.5)
nRBC: 0 % (ref 0.0–0.2)

## 2019-03-22 MED ORDER — CHLORHEXIDINE GLUCONATE CLOTH 2 % EX PADS
6.0000 | MEDICATED_PAD | Freq: Every day | CUTANEOUS | Status: DC
  Administered 2019-03-22 – 2019-03-24 (×3): 6 via TOPICAL

## 2019-03-22 MED ORDER — MUPIROCIN 2 % EX OINT
TOPICAL_OINTMENT | Freq: Two times a day (BID) | CUTANEOUS | Status: DC
  Administered 2019-03-22 – 2019-03-27 (×11): via NASAL
  Filled 2019-03-22: qty 22

## 2019-03-22 NOTE — Progress Notes (Signed)
Physical Therapy Session Note  Patient Details  Name: Connie Oconnor MRN: [id number] Date of Birth: Feb 06, 1941  Today's Date: 03/22/2019 PT Individual Time 5573-2202 Individual Time Calculation: 60 min     Short Term Goals: Week 4:  PT Short Term Goal 1 (Week 4): Patient will demonstrate slide board transfers with mod to max A +1 PT Short Term Goal 2 (Week 4): Patient will demonstrate sitting balance with min A at least 50% of the time to allow for functional sitting task EOB. PT Short Term Goal 3 (Week 4): Patient to perform sit to stand with Stedy with mod A +2 to ease toilet transfers  Skilled Therapeutic Interventions/Progress Updates:    Patient in supine and asleep.  Aroused slowly, then more fully with MD visit.  Reports has not had pain medication so RN made aware.  Total A to don THT stockings and to start pants, pt preferred to try to pull up in standing rather than rolling in bed.  Assisted legs towards EOB slowly and pt assisting to scoot hips.  Assisted to lift trunk max A and scoot to EOB from behind.  Patient seated to don shirt with min A and assist for sitting balance.  Patient sit to squat x 2 max A but unable to assist to pull up pants, so RN assisted with +2 for pulling up pants in supported squat position.  Patient transferred to [facility] w/c with max A squat pivot to edge of chair then assist to scoot hips back one at a time.  Patient in w/c assisted to set up breakfast tray at window in her room per her request with NT taking over for assist with tray end of session.    Therapy Documentation Precautions:  Precautions Precautions: Fall Precaution Comments: L hemi, back pain, needs time to initiate movements  Restrictions Weight Bearing Restrictions: No Pain: Pain Assessment Pain Scale: Faces Pain Score: 5  Faces Pain Scale: Hurts even more Pain Type: Chronic pain Pain Location: Hip Pain Orientation: Right;Left Pain Descriptors / Indicators: Spasm;Sharp Pain Onset:  With Activity Pain Intervention(s): Distraction;Repositioned    Therapy/Group: Individual Therapy  Elray Mcgregor  [name], PT 03/22/2019, 4:17 PM

## 2019-03-22 NOTE — Progress Notes (Signed)
Nutrition Follow-up  RD working remotely.  DOCUMENTATION CODES:   Severe malnutrition in context of acute illness/injury  INTERVENTION:   -Continue EnsureEnlive poTID, each supplement provides 350 kcal and 20 grams of protein  -ContinueMagicCup TID with meals, each supplement provides 290 kcal and 9 grams of protein  -ContinueMVI with minerals daily  - Encourage adequate PO intake and provide feeding assistance as needed  NUTRITION DIAGNOSIS:   Severe Malnutrition related to acute illness (acute lacunar infarct) as evidenced by energy intake < or equal to 50% for > or equal to 5 days, mild muscle depletion, moderate muscle depletion, mild fat depletion, moderate fat depletion.  Ongoing, being addressed via oral nutrition supplements  GOAL:   Patient will meet greater than or equal to 90% of their needs  Unmet at this time  MONITOR:   PO intake, Supplement acceptance, Labs, Weight trends, Skin  REASON FOR ASSESSMENT:   Malnutrition Screening Tool    ASSESSMENT:   78 y.o. female with history of PVC, hypothyroidism, arthritis, migraines presenting with L sided weakness to [place] on 4/21. MRI small R CR infarct, 57mm R MCA aneurysm. Admitted to [facility] on 4/24.  5/19 - EGD showing esophagitis, non-bleeding gastric ulcer, and erosive gastropathy with no active bleeding; diet advanced to Dysphagia 3  Noted that MD discussed RD recommendations for NG with SLP and PA on 5/19. It does not appear that there is a plan to pursue NGT placement at this time. Pt with severe acute malnutrition and PO intake remains very poor.  Noted pt is medically stable for transfer to SNF.  Weight stable over the last 3 days. Will continue to monitor trends.  Meal Completion: - 5/18: 0 meals recorded - 5/19: 20% x 1 meal (2 meals missing) - 5/20: 25% x 1 meal (2 meals missing) - 5/21: 25% x 1 meal  Difficult to discern pt's true po intake given variability in  meal completion documentation.  Medications reviewed and include: Ensure Enlive TID, Niferex, MVI with minerals, Protonix, Miralax, Senna  Labs reviewed: sodium 130 (L), hemoglobin 7.9 (L), elevated LFTs  Diet Order:   Diet Order            DIET DYS 3 Room service appropriate? Yes; Fluid consistency: Thin  Diet effective now              EDUCATION NEEDS:   Not appropriate for education at this time  Skin:  Skin Assessment: Reviewed RN Assessment(MASD to perineum)  Last BM:  03/22/19  Height:   Ht Readings from Last 1 Encounters:  03/20/19 5\' 5"  (1.651 m)    Weight:   Wt Readings from Last 1 Encounters:  03/22/19 69 kg    Ideal Body Weight:  56.8 kg  BMI:  Body mass index is 25.32 kg/m.  Estimated Nutritional Needs:   Kcal:  1700-1900 kcals   Protein:  80-90 g  Fluid:  >/= 1.7 L    Earma Reading, MS, RD, LDN Inpatient Clinical Dietitian Pager: [phone] Weekend/After Hours: [phone]

## 2019-03-22 NOTE — Progress Notes (Signed)
Occupational Therapy Session Note  Patient Details  Name: Connie Oconnor MRN: [id number] Date of Birth: Sep 27, 1941  Today's Date: 03/22/2019 OT Individual Time: 1330-1430 OT Individual Time Calculation (min): 60 min    Short Term Goals: Week 1:  OT Short Term Goal 1 (Week 1): Pt will consistently transfer to toilet/BSC wiht MIN A OT Short Term Goal 1 - Progress (Week 1): Progressing toward goal OT Short Term Goal 2 (Week 1): Pt will initiate bathing body parts with min VC OT Short Term Goal 2 - Progress (Week 1): Met OT Short Term Goal 3 (Week 1): pt will thread BUE into shirt wiht S OT Short Term Goal 3 - Progress (Week 1): Progressing toward goal OT Short Term Goal 4 (Week 1): Pt will recall hemi techniques iwht min VC OT Short Term Goal 4 - Progress (Week 1): Progressing toward goal OT Short Term Goal 5 (Week 1): Pt will thread 1LE into pants wiht VC OT Short Term Goal 5 - Progress (Week 1): Progressing toward goal  Skilled Therapeutic Interventions/Progress Updates:    Patient in bed upon therapy arrival with lunch tray at bedside untouched. Patient sleeping although easily aroused with name was called. Patient agreeable to getting into TIS chair to eat breakfast. Transitioning from supine to sit required increased time. Patient was resistant to assistance at first due to hip pain although therapist was able to explain technique which would allow to for the least amount of pain. Patient required Max assist to transition to seated on EOB. Left side lateral lean was present with patient requiring mod-max assistance to maintain seated balance at times. Squat pivot transfer completed with NDT to left knee at [facility] assistance. Patient was placed with lunch tray set-up in front of window. Assisted patient with hand over hand assist to allow for left hand to become a gross to stabilizer during lunch. Due to time constraint, patient did finish lunch using right hand. Intermittently, patient was able  to demonstrate some gross grasp and pinch in the left hand although it was inconsistent. Patient was left with lunch tray to finish with seat belt alarm on and call light within reach. Nursing was made aware of patient's location in room.   Therapy Documentation Precautions:  Precautions Precautions: Fall Precaution Comments: L hemi, back pain, needs time to initiate movements  Restrictions Weight Bearing Restrictions: No Pain: Pain Assessment Pain Scale: Faces Faces Pain Scale: Hurts even more Pain Type: Chronic pain Pain Location: Hip Pain Orientation: Right;Left Pain Descriptors / Indicators: Guarding;Grimacing Pain Onset: With Activity  Therapy/Group: Individual Therapy   Ailene Ravel, OTR/L,CBIS  [phone]  03/22/2019, 3:43 PM

## 2019-03-22 NOTE — Progress Notes (Signed)
Speech Language Pathology Daily Session Note  Patient Details  Name: Connie Oconnor MRN: [id number] Date of Birth: Feb 11, 1941  Today's Date: 03/22/2019 SLP Individual Time: 1030-1110 SLP Individual Time Calculation (min): 40 min  Short Term Goals: Week 4: SLP Short Term Goal 1 (Week 4): Patient will orient to place, situation and month with Mod A multimodal cues.  SLP Short Term Goal 2 (Week 4): Patient will demonstrate sustained attention to tasks for ~5 minutes with Mod A verbal cues for redirection.  SLP Short Term Goal 3 (Week 4): Patient will initiate tasks with Mod A verbal cues.  SLP Short Term Goal 4 (Week 4): Patient will demonstrate functional problem solving with Max A mulitmodal cues.   Skilled Therapeutic Interventions: Skilled treatment session focused on cognitive goals. Upon arrival, patient was awake while upright in the wheelchair. SLP facilitated session by providing Mod A verbal cues for initiation and problem solving during a basic card task. Patient independently requested to use the bathroom and was continent of bowel and bladder but required overall Max A verbal cues for sequencing and problem solving with task. Patient transferred back to bed at end of session. Patient left upright in bed with alarm on and all needs within reach. Continue with current plan of care.      Pain No/Denies Pain  Therapy/Group: Individual Therapy  Fayetta Sorenson 03/22/2019, 1:25 PM

## 2019-03-22 NOTE — Progress Notes (Signed)
[facility] PHYSICAL MEDICINE & REHABILITATION PROGRESS NOTE   Subjective/Complaints: Patient seen laying in bed this morning.  She states she did not sleep well overnight.  She cannot identify reason.  She is slightly slower this a.m.  ROS: Limited due to cognition, but appears to deny CP, shortness of breath, nausea, vomiting, diarrhea.  Objective:   No results found. Recent Labs    03/22/19 0234  WBC 13.4*  HGB 7.9*  HCT 24.0*  PLT 689*   No results for input(s): NA, K, CL, CO2, GLUCOSE, BUN, CREATININE, CALCIUM in the last 72 hours.  Intake/Output Summary (Last 24 hours) at 03/22/2019 1438 Last data filed at 03/22/2019 0732 Gross per 24 hour  Intake 120 ml  Output 600 ml  Net -480 ml     Physical Exam: Vital Signs Blood pressure (!) 130/47, pulse 73, temperature 98.2 F (36.8 C), temperature source Oral, resp. rate 18, height 5\' 5"  (1.651 m), weight 69 kg, SpO2 97 %. Constitutional: No distress . Vital signs reviewed. HENT: Normocephalic.  Atraumatic. Eyes: EOMI.  No discharge. Cardiovascular: No JVD. Respiratory: Normal effort. GI: Non-distended. Musc: Right hip TTP, stable. Neurologic: Alert and oriented x3 Motor:  Left upper extremity: Shoulder abduction 3/5, distally 4+/5, stable Left lower extremity: 4+/5 proximal to distal, unchanged Right lower extremity: 2/5 hip flexion (pain inhibition), knee extension, ankle dorsiflexion, (variable with participation), unchanged Skin: Warm and dry.  Intact.  Assessment/Plan: 1. Functional deficits secondary to right corona radiata infarct which require 3+ hours per day of interdisciplinary therapy in a comprehensive inpatient rehab setting.  Physiatrist is providing close team supervision and 24 hour management of active medical problems listed below.  Physiatrist and rehab team continue to assess barriers to discharge/monitor patient progress toward functional and medical goals  Care Tool:  Bathing  Bathing  activity did not occur: Safety/medical concerns Body parts bathed by patient: Left arm, Chest, Abdomen, Front perineal area, Right upper leg, Left upper leg   Body parts bathed by helper: Right arm, Buttocks, Right lower leg, Left lower leg     Bathing assist Assist Level: Moderate Assistance - Patient 50 - 74%     Upper Body Dressing/Undressing Upper body dressing   What is the patient wearing?: Pull over shirt    Upper body assist Assist Level: Maximal Assistance - Patient 25 - 49%    Lower Body Dressing/Undressing Lower body dressing      What is the patient wearing?: Pants, Incontinence brief     Lower body assist Assist for lower body dressing: Total Assistance - Patient < 25%     Toileting Toileting    Toileting assist Assist for toileting: Dependent - Patient 0%     Transfers Chair/bed transfer  Transfers assist     Chair/bed transfer assist level: 2 Helpers     Locomotion Ambulation   Ambulation assist   Ambulation activity did not occur: Safety/medical concerns(fatigue/pain )  Assist level: Moderate Assistance - Patient 50 - 74% Assistive device: Walker-rolling(and L hand splint) Max distance: 5'   Walk 10 feet activity   Assist  Walk 10 feet activity did not occur: Safety/medical concerns(fatigue/pain )        Walk 50 feet activity   Assist Walk 50 feet with 2 turns activity did not occur: Safety/medical concerns(fatigue/pain )         Walk 150 feet activity   Assist Walk 150 feet activity did not occur: Safety/medical concerns(fatigue/pain )         Walk  10 feet on uneven surface  activity   Assist Walk 10 feet on uneven surfaces activity did not occur: Safety/medical concerns(fatigue/pain )         Wheelchair     Assist Will patient use wheelchair at discharge?: Yes Type of Wheelchair: Manual    Wheelchair assist level: Dependent - Patient 0% Max wheelchair distance: 25'    Wheelchair 50 feet with 2  turns activity    Assist    Wheelchair 50 feet with 2 turns activity did not occur: Safety/medical concerns(fatigue/pain )   Assist Level: Dependent - Patient 0%   Wheelchair 150 feet activity     Assist Wheelchair 150 feet activity did not occur: Safety/medical concerns(fatigue/pain )   Assist Level: Dependent - Patient 0%    Medical Problem List and Plan: 1.Left side weaknesssecondary to 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  Cont CIR Plan outpatient coiling of aneurysm per interventional radiology  Will consider anti-spasticity medications if necessary, not appropriate at this point  CT head reviewed, unremarkable for acute intracranial process, but showing signs of microvascular changes 2. Antithrombotics: -DVT/anticoagulation:SCDs.  Lower extremity Dopplers negative DVT -antiplatelet therapy: aspirin 81 mg daily Plavix 75 mg daily 3 weeks and Plavix alone 3. Pain Management/migraine headaches:Tramadol and Flexeril DC'd  Added kpad for low back  Muscle rub ordered for left calf and right thigh- improved 4. Mood:Provide emotional support -antipsychotic agents: N/A 5. Neuropsych/vascular dementia: This patientis not capable of making decisions on herown behalf.  Continue tele-sitter for safety  Needs significant encouragement 6. Skin/Wound Care:Routine skin checks 7. Fluids/Electrolytes/Nutrition:Routine in and outs  D3 thins, advance diet as tolerated 8. History of traumatic [facility] May 2018. Patient seen in the past by Dr. Conchita Paris 9. Hypertension. Vitals:   03/22/19 1343 03/22/19 1345  BP:  (!) 130/47  Pulse: 74 73  Resp: 18 18  Temp: 98.2 F (36.8 C) 98.2 F (36.8 C)  SpO2: 98% 97%   Norvasc 10 mg daily, decreased to 5 mg on 4/26, DC'd on 5/14 due to daughter's concern of side  effects  Losartan 12.5 daily started on 5/16 (daughter states she personally is able to tolerate this), increased to 25 on 5/19  Labile on 5/21 10. Hyperlipidemia. Lipitor 11. Tobacco abuse. Counseling 12.  Hyperglycemia  Hemoglobin A1c 5.5 on 01/2019  P.o. intake remains inconsistent on 5/18  Patient needs significant time and cueing to encourage intake. 13.  Acute blood loss anemia  Hemoccult ordered and positive, repeat negative, positive again on 5/11  Hgb 7.9 on 5/21  Added Fe++ supp  GI consulted, appreciate recs and EGD- nonbleeding gastric ulcer and erosive gastropathy, recommended PPI twice daily x8 weeks then daily.  Recommended Carafate 1 g 4 times daily for 2 weeks. 14.  Hypoalbuminemia  Supplement initiated on 4/27 15.  Slow transit constipation  Bowel regimen increased on 5/3, increased again on 5/4  Improving 16.  Confusion-multifactorial  Vascular dementia, confirmed by daughter  WBCs 13.4 on 5/21  Improving overall  Urine culture with MRSA  Completed Macrobid started on 5/6-5/13  Repeat UA relatively unremarkable, repeat urine culture no growth  CT chest reviewed showing emphysema with scattered pulmonary nodules with outpatient follow-up scan.  Ammonia within normal limits  Lipitor, amlodipine DC'd per daughter request 10.  Hip OA  Bilaterally noted on x-ray, personally reviewed.  Voltaren gel ordered with improvement 18.  Emphysema with  scattered pulmonary nodules  Repeat chest CT as outpatient  Noted on CT  ABG reviewed showing decreased PO2  Supplemental oxygen ordered 19.  Transaminitis  LFTs elevated, but improving on 5/18  Lipitor DC'd 20.  Hyponatremia  Sodium 130 on 5/18  Continue to monitor 21.  Tobacco abuse  Patient heavy smoker at baseline  Nicotine patch ordered on 5/14  LOS: 27 days A FACE TO FACE EVALUATION WAS PERFORMED   Connie Oconnor  03/22/2019, 2:38 PM

## 2019-03-23 ENCOUNTER — Inpatient Hospital Stay (HOSPITAL_COMMUNITY): Payer: Medicare Other

## 2019-03-23 ENCOUNTER — Inpatient Hospital Stay (HOSPITAL_COMMUNITY): Payer: Medicare Other | Admitting: Speech Pathology

## 2019-03-23 NOTE — Progress Notes (Signed)
Social Work Patient ID: Connie Oconnor, female   DOB: 19-Mar-1941, 78 y.o.   MRN: [id number] Spoke with [facility] they will have a bed for pt on Tuesday. Have let her daughter's know and pt and all are on board with this. Dan-PA aware and will let team know.

## 2019-03-23 NOTE — Progress Notes (Signed)
Speech Language Pathology Weekly Progress and Session Note  Patient Details  Name: Connie Oconnor MRN: [id number] Date of Birth: 1941/05/27  Beginning of progress report period: Mar 16, 2019 End of progress report period: Mar 23, 2019  Today's Date: 03/23/2019 SLP Individual Time: 7824-2353 SLP Individual Time Calculation (min): 45 min  Short Term Goals: Week 4: SLP Short Term Goal 1 (Week 4): Patient will orient to place, situation and month with Mod A multimodal cues.  SLP Short Term Goal 1 - Progress (Week 4): Met SLP Short Term Goal 2 (Week 4): Patient will demonstrate sustained attention to tasks for ~5 minutes with Mod A verbal cues for redirection.  SLP Short Term Goal 2 - Progress (Week 4): Met SLP Short Term Goal 3 (Week 4): Patient will initiate tasks with Mod A verbal cues.  SLP Short Term Goal 3 - Progress (Week 4): Met SLP Short Term Goal 4 (Week 4): Patient will demonstrate functional problem solving with Max A mulitmodal cues.  SLP Short Term Goal 4 - Progress (Week 4): Met    New Short Term Goals: Week 5: SLP Short Term Goal 1 (Week 5): STGs=LTGs  Weekly Progress Updates: Patient has made cognitive gains and has met all STGs this reporting period. Currently, patient requires overall Mod A multimodal cues for initiation, sustained attention, and orientation and Max A multimodal cues for functional problem solving. Suspect patient's improvement is due to increased arousal/alertness and overall participation. Patient and family education ongoing. Patient would benefit from continued skilled SLP intervention to maximize her cognitive functioning prior to discharge.      Intensity: Minumum of 1-2 x/day, 30 to 90 minutes Frequency: 3 to 5 out of 7 days Duration/Length of Stay: TBD due to possible SNF placement  Treatment/Interventions: Cognitive remediation/compensation;Environmental controls;Internal/external aids;Speech/Language facilitation;Therapeutic  Activities;Patient/family education;Functional tasks;Cueing hierarchy;Dysphagia/aspiration precaution training   Daily Session  Skilled Therapeutic Interventions: Skilled treatment session focused on cognitive goals. SLP facilitated session by providing Min A verbal cues for initiation and attention and Mod A verbal cues for problem solving during self-feeding with breakfast meal of Dys. 3 textures with thin liquids. Patient consumed meal without overt s/s of aspiration. Patient maintained alertness and arousal throughout session. Patient left upright in bed with alarm on and all needs within reach. Continue with current plan of care.      Pain No/Denies Pain   Therapy/Group: Individual Therapy  Kyri Shader 03/23/2019, 6:30 AM

## 2019-03-23 NOTE — Progress Notes (Signed)
Physical Therapy Session Note  Patient Details  Name: Connie Oconnor MRN: [id number] Date of Birth: 24-Feb-1941  Today's Date: 03/23/2019 PT Individual Time: 1330-1430 PT Individual Time Calculation (min): 60 min   Short Term Goals: Week 4:  PT Short Term Goal 1 (Week 4): Patient will demonstrate slide board transfers with mod to max A +1 PT Short Term Goal 2 (Week 4): Patient will demonstrate sitting balance with min A at least 50% of the time to allow for functional sitting task EOB. PT Short Term Goal 3 (Week 4): Patient to perform sit to stand with Stedy with mod A +2 to ease toilet transfers  Skilled Therapeutic Interventions/Progress Updates:    Patient in supine and reports she was up earlier in w/c and fell asleep then was hurting a lot.  Assisted to roll to L using bed rail and mod A and pillow placed between knees.  Requested pain medication from RN, then applied gel to lower back and R hip in L sidelying.  Side to sit max A to assist legs off bed and pt pulling up on PT.  Assisted to stand to stedy with mod A pt counting to 3 then rising from elevated bed.  To BSC on stedy total A, stood briefly with min A to doff pants then to sit on BSC. Sat for awhile on BSC, but unable to void.  Sit to stand mod A to Stedy to pull up pants total A, then sat back on BSC.  Stood again for finishing pulling up pants and getting onto [facility] for transfer to bed with total A.  Sit to stand mod A to sit back onto bed.  Sit to supine mod A for positioning legs in bed.  Left in supine with call bell in reach and bed alarm activated.   Therapy Documentation Precautions:  Precautions Precautions: Fall Precaution Comments: L hemi, back pain, needs time to initiate movements  Restrictions Weight Bearing Restrictions: No Pain: Pain Assessment Pain Scale: 0-10 Pain Score: 8  Faces Pain Scale: Hurts even more Pain Type: Chronic pain Pain Location: Back Pain Orientation: Right;Left Pain Descriptors /  Indicators: Aching Pain Frequency: Intermittent Pain Onset: On-going Patients Stated Pain Goal: 2 Pain Intervention(s): Repositioned;Back rub(with gel)    Therapy/Group: Individual Therapy  Elray Mcgregor  [place], PT 03/23/2019, 2:15 PM

## 2019-03-23 NOTE — Progress Notes (Signed)
[place] PHYSICAL MEDICINE & REHABILITATION PROGRESS NOTE   Subjective/Complaints: Patient seen sitting up in bed this morning eating breakfast.  She states she feels better this AM.  She states she did not sleep well overnight due to leg pain which improved with pain gel.  ROS: Limited due to cognition, but appears to deny CP, shortness of breath, nausea, vomiting, diarrhea.  Objective:   No results found. Recent Labs    03/22/19 0234  WBC 13.4*  HGB 7.9*  HCT 24.0*  PLT 689*   No results for input(s): NA, K, CL, CO2, GLUCOSE, BUN, CREATININE, CALCIUM in the last 72 hours.  Intake/Output Summary (Last 24 hours) at 03/23/2019 1248 Last data filed at 03/23/2019 0820 Gross per 24 hour  Intake 660 ml  Output -  Net 660 ml     Physical Exam: Vital Signs Blood pressure (!) 132/58, pulse 79, temperature 98.3 F (36.8 C), temperature source Oral, resp. rate 19, height  (1.651 m), weight 69 kg, SpO2 98 %. Constitutional: No distress . Vital signs reviewed. HENT: Normocephalic.  Atraumatic Eyes: EOMI.  No discharge. Cardiovascular: No JVD. Respiratory: Normal effort. GI: Non-distended. Musc: Right hip TTP, unchanged. Neurologic: Alert and oriented x3 Motor:  Left upper extremity: Shoulder abduction 3/5, distally 4+/5, stable Left lower extremity: 4+/5 proximal to distal, stable Right lower extremity: 2/5 hip flexion (pain inhibition), knee extension, ankle dorsiflexion, (variable with participation), stable Skin: Warm and dry.  Intact.  Assessment/Plan: 1. Functional deficits secondary to right corona radiata infarct which require 3+ hours per day of interdisciplinary therapy in a comprehensive inpatient rehab setting.  Physiatrist is providing close team supervision and 24 hour management of active medical problems listed below.  Physiatrist and rehab team continue to assess barriers to discharge/monitor patient progress toward functional and medical goals  Care  Tool:  Bathing  Bathing activity did not occur: Safety/medical concerns Body parts bathed by patient: Left arm, Chest, Abdomen, Front perineal area, Right upper leg, Left upper leg   Body parts bathed by helper: Right arm, Buttocks, Right lower leg, Left lower leg     Bathing assist Assist Level: Moderate Assistance - Patient 50 - 74%     Upper Body Dressing/Undressing Upper body dressing   What is the patient wearing?: Pull over shirt    Upper body assist Assist Level: Maximal Assistance - Patient 25 - 49%    Lower Body Dressing/Undressing Lower body dressing      What is the patient wearing?: Pants, Incontinence brief     Lower body assist Assist for lower body dressing: Total Assistance - Patient < 25%     Toileting Toileting    Toileting assist Assist for toileting: Dependent - Patient 0%     Transfers Chair/bed transfer  Transfers assist     Chair/bed transfer assist level: Total Assistance - Patient < 25%     Locomotion Ambulation   Ambulation assist   Ambulation activity did not occur: Safety/medical concerns(fatigue/pain )  Assist level: Moderate Assistance - Patient 50 - 74% Assistive device: Walker-rolling(and L hand splint) Max distance: 5'   Walk 10 feet activity   Assist  Walk 10 feet activity did not occur: Safety/medical concerns(fatigue/pain )        Walk 50 feet activity   Assist Walk 50 feet with 2 turns activity did not occur: Safety/medical concerns(fatigue/pain )         Walk 150 feet activity   Assist Walk 150 feet activity did not occur: Safety/medical  concerns(fatigue/pain )         Walk 10 feet on uneven surface  activity   Assist Walk 10 feet on uneven surfaces activity did not occur: Safety/medical concerns(fatigue/pain )         Wheelchair     Assist Will patient use wheelchair at discharge?: Yes Type of Wheelchair: Manual    Wheelchair assist level: Dependent - Patient 0% Max wheelchair  distance: 215'    Wheelchair 50 feet with 2 turns activity    Assist    Wheelchair 50 feet with 2 turns activity did not occur: Safety/medical concerns(fatigue/pain )   Assist Level: Dependent - Patient 0%   Wheelchair 150 feet activity     Assist Wheelchair 150 feet activity did not occur: Safety/medical concerns(fatigue/pain )   Assist Level: Dependent - Patient 0%    Medical Problem List and Plan: 1.Left side weaknesssecondary to 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  Cont CIR, plan for SNF Plan outpatient coiling of aneurysm per interventional radiology  Will consider anti-spasticity medications if necessary, not appropriate at this point  CT head reviewed, unremarkable for acute intracranial process, but showing signs of microvascular changes 2. Antithrombotics: -DVT/anticoagulation:SCDs.  Lower extremity Dopplers negative DVT -antiplatelet therapy: aspirin 81 mg daily Plavix 75 mg daily 3 weeks and Plavix alone 3. Pain Management/migraine headaches:Tramadol and Flexeril DC'd  Added kpad for low back  Muscle rub ordered for left calf and right thigh- improved 4. Mood:Provide emotional support -antipsychotic agents: N/A 5. Neuropsych/vascular dementia: This patientis not capable of making decisions on herown behalf.  Continue tele-sitter for safety  Needs significant encouragement 6. Skin/Wound Care:Routine skin checks 7. Fluids/Electrolytes/Nutrition:Routine in and outs  D3 thins, advance diet as tolerated 8. [facility]. Patient seen in the past by Dr. Conchita ParisNundkumar 9. Hypertension. Vitals:   03/22/19 1920 03/23/19 0548  BP: (!) 120/35 (!) 132/58  Pulse: 84 79  Resp: 18 19  Temp: 98.5 F (36.9 C) 98.3 F (36.8 C)  SpO2: 98% 98%   Norvasc 10 mg daily,  decreased to 5 mg on 4/26, DC'd on 5/14 due to daughter's concern of side effects  Losartan 12.5 daily started on 5/16 (daughter states she personally is able to tolerate this), increased to 25 on 5/19  Relatively controlled on 5/22 10. Hyperlipidemia. Lipitor 11. Tobacco abuse. Counseling 12.  Hyperglycemia  Hemoglobin A1c 5.5 on 01/2019  P.o. intake remains inconsistent on 5/18  Patient needs significant time and cueing to encourage intake. 13.  Acute blood loss anemia  Hemoccult ordered and positive, repeat negative, positive again on 5/11  Hgb 7.9 on 5/21, labs ordered for Monday  Added Fe++ supp  GI consulted, appreciate recs and EGD- nonbleeding gastric ulcer and erosive gastropathy, recommended PPI twice daily x8 weeks then daily.  Recommended Carafate 1 g 4 times daily for 2 weeks. 14.  Hypoalbuminemia  Supplement initiated on 4/27 15.  Slow transit constipation  Bowel regimen increased on 5/3, increased again on 5/4  Improving 16.  Confusion-multifactorial  Vascular dementia, confirmed by daughter  WBCs 13.4 on 5/21, labs ordered for Monday  Improving overall  Urine culture with MRSA  Completed Macrobid started on 5/6-5/13  Repeat UA relatively unremarkable, repeat urine culture no growth  CT chest reviewed showing emphysema with scattered pulmonary nodules with outpatient follow-up scan.  Ammonia within normal limits  Lipitor, amlodipine DC'd  per daughter request 49.  Hip OA  Bilaterally noted on x-ray, personally reviewed.  Voltaren gel ordered with improvement 18.  Emphysema with scattered pulmonary nodules  Repeat chest CT as outpatient  Noted on CT  ABG reviewed showing decreased PO2  Supplemental oxygen ordered 19.  Transaminitis  LFTs elevated, but improving on 5/18  Lipitor DC'd 20.  Hyponatremia  Sodium 130 on 5/18  Continue to monitor 21.  Tobacco abuse  Patient heavy smoker at baseline  Nicotine patch ordered on 5/14  LOS: 28 days A FACE TO FACE  EVALUATION WAS PERFORMED  Ova Gillentine Karis Juba 03/23/2019, 12:48 PM

## 2019-03-23 NOTE — Progress Notes (Signed)
Occupational Therapy Session Note  Patient Details  Name: Connie Oconnor MRN: [id number] Date of Birth: 1941/04/25  Today's Date: 03/23/2019 OT Individual Time: 0930-1030 OT Individual Time Calculation (min): 60 min    Short Term Goals: Week 2:  OT Short Term Goal 1 (Week 2): Pt will consistently transfer to toilet/BSC wiht MIN A OT Short Term Goal 1 - Progress (Week 2): Progressing toward goal OT Short Term Goal 2 (Week 2): pt will thread BUE into shirt wiht S OT Short Term Goal 2 - Progress (Week 2): Progressing toward goal OT Short Term Goal 3 (Week 2): Pt will recall hemi techniques iwht min VC OT Short Term Goal 3 - Progress (Week 2): Progressing toward goal OT Short Term Goal 4 (Week 2): Pt will thread 1LE into pants wiht VC OT Short Term Goal 4 - Progress (Week 2): Met  Skilled Therapeutic Interventions/Progress Updates:    Pt in bed upon therapy arrival and agreeable to participate in OT treatment session. Patient agreeable to change clothes and requested to use the bathroom. Pt transitioned from supine to sitting EOB with Max assist with VC for form and technique. 1st attempt to transfer to [facility], patient did not assist and transfer was terminated. 2nd attempt, patient was provided with step by step instruction and verbalized understanding. Pt transferred with a stand pivot transfer with NDT to left knee to [facility] at Total assist. Patient was able to void on BSC. Pt doffed shirt with supervision while provided with VC for each step of technique. Patient then donned clean shirt with supervision and VC for each step. Transfer back to bed was completed with same technique and max assist. Sit to supine transfer was completed with min assist. Pt completed bed mobility while rolling right and left for draw sheet change and brief donning. Required hand over hand assist to reach for bed railing and mod assist to roll each direction with increased time due to pain in bilateral hips with movement.  Manual techniques completed to Left hamstring to decrease muscle tightness and allow for increased mobility in order to perform sit to stands and transfers. Patient then transitioned to sitting on EOB with Mod assist. Performed a stand pivot transfer from bed to TIS chair at Total assist with NDT blocking to left leg. Total assist to scoot back into chair. Patient was left in chair to look out window with seat belt alarm on and call light within reach. NT was made aware of patient location. Tele sitter on at end of session.   Therapy Documentation Precautions:  Precautions Precautions: Fall Precaution Comments: L hemi, back pain, needs time to initiate movements  Restrictions Weight Bearing Restrictions: No Pain: Pain Assessment Pain Scale: Faces Pain Score: 3  Faces Pain Scale: Hurts even more Pain Type: Chronic pain Pain Location: Hip Pain Orientation: Right;Left Pain Descriptors / Indicators: Grimacing;Guarding Pain Onset: With Activity Pain Intervention(s): Distraction;Emotional support;Relaxation;Guided imagery;Massage   Therapy/Group: Individual Therapy   Ailene Ravel, OTR/L,CBIS  [phone]  03/23/2019, 12:05 PM

## 2019-03-24 DIAGNOSIS — K257 Chronic gastric ulcer without hemorrhage or perforation: Secondary | ICD-10-CM

## 2019-03-24 NOTE — Progress Notes (Signed)
[place] PHYSICAL MEDICINE & REHABILITATION PROGRESS NOTE   Subjective/Complaints: No issues overnight slept well.  Patient feels left hand is no longer balling up  ROS: Limited due to cognition, but appears to deny CP, shortness of breath, nausea, vomiting, diarrhea.  Objective:   No results found. Recent Labs    03/22/19 0234  WBC 13.4*  HGB 7.9*  HCT 24.0*  PLT 689*   No results for input(s): NA, K, CL, CO2, GLUCOSE, BUN, CREATININE, CALCIUM in the last 72 hours.  Intake/Output Summary (Last 24 hours) at 03/24/2019 0757 Last data filed at 03/23/2019 1740 Gross per 24 hour  Intake 360 ml  Output -  Net 360 ml     Physical Exam: Vital Signs Blood pressure 112/81, pulse 70, temperature 97.8 F (36.6 C), temperature source Oral, resp. rate 18, height 5\' 5"  (1.651 m), weight 78.5 kg, SpO2 97 %. Constitutional: No distress . Vital signs reviewed. HENT: Normocephalic.  Atraumatic Eyes: EOMI.  No discharge. Cardiovascular: No JVD. Respiratory: Normal effort. GI: Non-distended. Musc: Right hip TTP, unchanged. Neurologic: Alert and oriented x3 Motor:  Left upper extremity: Shoulder abduction 3/5, distally 4+/5, stable Left lower extremity: 4+/5 proximal to distal, stable Right lower extremity: 2/5 hip flexion (pain inhibition), knee extension, ankle dorsiflexion, (variable with participation), stable Skin: Warm and dry.  Intact.  Assessment/Plan: 1. Functional deficits secondary to right corona radiata infarct which require 3+ hours per day of interdisciplinary therapy in a comprehensive inpatient rehab setting.  Physiatrist is providing close team supervision and 24 hour management of active medical problems listed below.  Physiatrist and rehab team continue to assess barriers to discharge/monitor patient progress toward functional and medical goals  Care Tool:  Bathing  Bathing activity did not occur: Safety/medical concerns Body parts bathed by patient: Left  arm, Chest, Abdomen, Front perineal area, Right upper leg, Left upper leg   Body parts bathed by helper: Right arm, Buttocks, Right lower leg, Left lower leg     Bathing assist Assist Level: Moderate Assistance - Patient 50 - 74%     Upper Body Dressing/Undressing Upper body dressing   What is the patient wearing?: Pull over shirt    Upper body assist Assist Level: Maximal Assistance - Patient 25 - 49%    Lower Body Dressing/Undressing Lower body dressing      What is the patient wearing?: Pants, Incontinence brief     Lower body assist Assist for lower body dressing: Total Assistance - Patient < 25%     Toileting Toileting    Toileting assist Assist for toileting: Dependent - Patient 0%     Transfers Chair/bed transfer  Transfers assist     Chair/bed transfer assist level: Total Assistance - Patient < 25%     Locomotion Ambulation   Ambulation assist   Ambulation activity did not occur: Safety/medical concerns(fatigue/pain )  Assist level: Moderate Assistance - Patient 50 - 74% Assistive device: Walker-rolling(and L hand splint) Max distance: 5'   Walk 10 feet activity   Assist  Walk 10 feet activity did not occur: Safety/medical concerns(fatigue/pain )        Walk 50 feet activity   Assist Walk 50 feet with 2 turns activity did not occur: Safety/medical concerns(fatigue/pain )         Walk 150 feet activity   Assist Walk 150 feet activity did not occur: Safety/medical concerns(fatigue/pain )         Walk 10 feet on uneven surface  activity   Assist Walk  10 feet on uneven surfaces activity did not occur: Safety/medical concerns(fatigue/pain )         Wheelchair     Assist Will patient use wheelchair at discharge?: Yes Type of Wheelchair: Manual    Wheelchair assist level: Dependent - Patient 0% Max wheelchair distance: 15'    Wheelchair 50 feet with 2 turns activity    Assist    Wheelchair 50 feet with 2  turns activity did not occur: Safety/medical concerns(fatigue/pain )   Assist Level: Dependent - Patient 0%   Wheelchair 150 feet activity     Assist Wheelchair 150 feet activity did not occur: Safety/medical concerns(fatigue/pain )   Assist Level: Dependent - Patient 0%    Medical Problem List and Plan: 1.Left side weaknesssecondary to 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  Cont CIR, PT OT speech plan for SNF Plan outpatient coiling of aneurysm per interventional radiology  Will consider anti-spasticity medications if necessary, not appropriate at this point  CT head reviewed, unremarkable for acute intracranial process, but showing signs of microvascular changes 2. Antithrombotics: -DVT/anticoagulation:SCDs.  Lower extremity Dopplers negative DVT -antiplatelet therapy: aspirin 81 mg daily Plavix 75 mg daily 3 weeks and Plavix alone 3. Pain Management/migraine headaches:Tramadol and Flexeril DC'd  Added kpad for low back  Muscle rub ordered for left calf and right thigh- improved 4. Mood:Provide emotional support -antipsychotic agents: N/A 5. Neuropsych/vascular dementia: This patientis not capable of making decisions on herown behalf.  Continue tele-sitter for safety  Needs significant encouragement 6. Skin/Wound Care:Routine skin checks 7. Fluids/Electrolytes/Nutrition:Routine in and outs  D3 thins, advance diet as tolerated 8. History of traumatic [facility] May 2018. Patient seen in the past by Dr. Conchita Paris 9. Hypertension. Vitals:   03/23/19 1923 03/24/19 0501  BP: (!) 120/43 112/81  Pulse: 72 70  Resp: 18 18  Temp: 98.3 F (36.8 C) 97.8 F (36.6 C)  SpO2: 97% 97%   Norvasc 10 mg daily, decreased to 5 mg on 4/26, DC'd on 5/14 due to daughter's concern of side effects  Losartan 12.5 daily  started on 5/16 (daughter states she personally is able to tolerate this), increased to 25 on 5/19  Relatively controlled on 5/23, will monitor for dizziness 10. Hyperlipidemia. Lipitor 11. Tobacco abuse. Counseling 12.  Hyperglycemia  Hemoglobin A1c 5.5 on 01/2019  P.o. intake remains inconsistent on 5/18  Patient needs significant time and cueing to encourage intake. 13.  Acute blood loss anemia  Hemoccult ordered and positive, repeat negative, positive again on 5/11  Hgb 7.9 on 5/21, labs ordered for Monday  Added Fe++ supp  GI consulted, appreciate recs and EGD- nonbleeding gastric ulcer and erosive gastropathy, recommended PPI twice daily x8 weeks then daily.  Recommended Carafate 1 g 4 times daily for 2 weeks. 14.  Hypoalbuminemia  Supplement initiated on 4/27 15.  Slow transit constipation  Bowel regimen increased on 5/3, increased again on 5/4  Improving 16.  Confusion-multifactorial  Vascular dementia, confirmed by daughter  WBCs 13.4 on 5/21, labs ordered for Monday  Improving overall  Urine culture with MRSA  Completed Macrobid started on 5/6-5/13  Repeat UA relatively unremarkable, repeat urine culture no growth  CT chest reviewed showing emphysema with scattered pulmonary nodules with outpatient follow-up scan.  Ammonia within normal limits  Lipitor, amlodipine DC'd per daughter request 65.  Hip OA  Bilaterally noted on x-ray, personally reviewed.  Voltaren  gel ordered with improvement 18.  Emphysema with scattered pulmonary nodules  Repeat chest CT as outpatient  Noted on CT  ABG reviewed showing decreased PO2  Supplemental oxygen ordered 19.  Transaminitis  LFTs elevated, but improving on 5/18  Lipitor DC'd 20.  Hyponatremia  Sodium 130 on 5/18  Continue to monitor 21.  Tobacco abuse  Patient heavy smoker at baseline  Nicotine patch ordered on 5/14  LOS: 29 days A FACE TO FACE EVALUATION WAS PERFORMED  Erick Colace 03/24/2019, 7:57 AM

## 2019-03-25 ENCOUNTER — Inpatient Hospital Stay (HOSPITAL_COMMUNITY): Payer: Medicare Other

## 2019-03-25 NOTE — Discharge Summary (Addendum)
Physician Discharge Summary  Patient ID: Sani Diersen MRN: [id number] DOB/AGE: [birth year] 78 y.o.  Admit date: 02/23/2019 Discharge date: 03/27/2019  Discharge Diagnoses:  Active Problems:   Lacunar infarct, acute (HCC)   Hypoalbuminemia due to protein-calorie malnutrition (HCC)   Acute blood loss anemia   Hypoglycemia   Essential hypertension   History of traumatic brain injury   Hyperglycemia   Hypotension   Labile blood pressure   Dysphagia, post-stroke   Impulsiveness   Slow transit constipation   Pain of left calf   Leukocytosis   Acute lower UTI   Lethargy   Confusion, postoperative   Pulmonary emphysema (HCC)   Pulmonary nodules   Primary osteoarthritis of right hip   Subacute confusional state   Acute respiratory failure with hypoxia (HCC)   Hyponatremia   Tobacco abuse   Gastrointestinal hemorrhage   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   Gastric erosion   Discharged Condition: stable  Significant Diagnostic Studies: Dg Chest 2 View  Result Date: 03/13/2019 CLINICAL DATA:  Leukocytosis EXAM: CHEST - 2 VIEW COMPARISON:  None. FINDINGS: The heart size and mediastinal contours are within normal limits. There is mild, diffuse bilateral interstitial pulmonary opacity. Disc degenerative disease and osteophytosis of the thoracic spine. There is a high-grade wedge deformity of a lower thoracic or upper lumbar vertebral body. IMPRESSION: There is mild, diffuse bilateral interstitial pulmonary opacity, which may reflect edema or atypical/viral infection. No evident focal airspace opacity. Electronically Signed   By: Lauralyn Primes M.D.   On: 03/13/2019 11:05   Ct Head Wo Contrast  Result Date: 03/15/2019 CLINICAL DATA:  Altered level of consciousness today. EXAM: CT HEAD WITHOUT CONTRAST TECHNIQUE: Contiguous axial images were obtained from the base of the skull through the vertex without intravenous contrast. COMPARISON:  Brain MRI 02/20/2019.   Head CT 03/29/2017. FINDINGS: Brain: No evidence of acute infarction, hemorrhage, hydrocephalus, extra-axial collection or mass lesion/mass effect. Extensive chronic microvascular ischemic change noted. Vascular: No hyperdense vessel or unexpected calcification. Skull: Intact.  No focal lesion. Sinuses/Orbits: Negative. Other: None. IMPRESSION: No acute abnormality. Extensive chronic microvascular ischemic change. Electronically Signed   By: Drusilla Kanner M.D.   On: 03/15/2019 18:20   Ct Chest Wo Contrast  Result Date: 03/14/2019 CLINICAL DATA:  Shortness of breath. EXAM: CT CHEST WITHOUT CONTRAST TECHNIQUE: Multidetector CT imaging of the chest was performed following the standard protocol without IV contrast. COMPARISON:  None. FINDINGS: Cardiovascular: The heart size is normal. No substantial pericardial effusion. Coronary artery calcification is evident. Atherosclerotic calcification is noted in the wall of the thoracic aorta. Mediastinum/Nodes: No mediastinal lymphadenopathy. No evidence for gross hilar lymphadenopathy although assessment is limited by the lack of intravenous contrast on today's study. Tiny hiatal hernia evident. The esophagus has normal imaging features. There is no axillary lymphadenopathy. Lungs/Pleura: The central tracheobronchial airways are patent. Centrilobular emphsyema noted. 5 mm right upper lobe nodule visible on 48/3. 6 mm right middle lobe nodule visible on 80/3. 4 mm right lower lobe nodule visible on 96/3. 4 mm left upper lobe nodule visible on 59/3. Additional scattered tiny pulmonary nodules are evident. Small airway impaction noted in the left lower lobe. Scattered areas of mosaic attenuation are nonspecific but likely reflect air trapping secondary to small airways disease. No pleural effusion. Upper Abdomen: Unremarkable. Musculoskeletal: No worrisome lytic or sclerotic osseous abnormality. IMPRESSION: Emphysema with scattered bilateral pulmonary nodules measuring up  to 6 mm. Non-contrast chest CT at 3-6 months is recommended. If the nodules  are stable at time of repeat CT, then future CT at 18-24 months (from today's scan) is considered optional for low-risk patients, but is recommended for high-risk patients. This recommendation follows the consensus statement: Guidelines for Management of Incidental Pulmonary Nodules Detected on CT Images: From the Fleischner Society 2017; Radiology 2017; 284:228-243. No focal airspace consolidation. Aortic Atherosclerois (ICD10-170.0) Electronically Signed   By: Kennith Center M.D.   On: 03/14/2019 12:25   Dg Hip Unilat With Pelvis 2-3 Views Left  Result Date: 03/14/2019 CLINICAL DATA:  Left hip pain for 1 month EXAM: DG HIP (WITH OR WITHOUT PELVIS) 2-3V LEFT COMPARISON:  Chest FINDINGS: Moderate osteoarthritis changes in the hips bilaterally with joint space narrowing and spurring. SI joints symmetric and unremarkable. Postoperative changes in the lower lumbar spine. No acute bony abnormality. Specifically, no fracture, subluxation, or dislocation. 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 No acute bony abnormality. Electronically Signed   By: Charlett Nose M.D.   On: 03/14/2019 15:44   Vas [place] Lower Extremity Venous (dvt)  Result Date: 03/06/2019  Lower Venous Study Indications: Edema.  Performing Technologist: Jeb Levering RDMS, RVT  Examination Guidelines: A complete evaluation includes B-mode imaging, spectral Doppler, color Doppler, and power Doppler as needed of all accessible portions of each vessel. Bilateral testing is considered an integral part of a complete examination. Limited examinations for reoccurring indications may be performed as noted.  +-----+---------------+---------+-----------+----------+-------+ RIGHTCompressibilityPhasicitySpontaneityPropertiesSummary +-----+---------------+---------+-----------+----------+-------+ CFV  Full           Yes      Yes                           +-----+---------------+---------+-----------+----------+-------+   +---------+---------------+---------+-----------+----------+-------+ LEFT     CompressibilityPhasicitySpontaneityPropertiesSummary +---------+---------------+---------+-----------+----------+-------+ CFV      Full           Yes      Yes                          +---------+---------------+---------+-----------+----------+-------+ SFJ      Full                                                 +---------+---------------+---------+-----------+----------+-------+ FV Prox  Full                                                 +---------+---------------+---------+-----------+----------+-------+ FV Mid   Full                                                 +---------+---------------+---------+-----------+----------+-------+ FV DistalFull                                                 +---------+---------------+---------+-----------+----------+-------+ PFV      Full                                                 +---------+---------------+---------+-----------+----------+-------+  POP      Full           Yes      Yes                          +---------+---------------+---------+-----------+----------+-------+ PTV      Full                                                 +---------+---------------+---------+-----------+----------+-------+ PERO     Full                                                 +---------+---------------+---------+-----------+----------+-------+     Summary: Right: No evidence of common femoral vein obstruction. Left: There is no evidence of deep vein thrombosis in the lower extremity. No cystic structure found in the popliteal fossa.  *See table(s) above for measurements and observations. Electronically signed by Fabienne Brunsharles Fields MD on 03/06/2019 at 2:38:57 AM.    Final     Labs:  Basic Metabolic Panel: Recent Labs  Lab 03/19/19 1249  NA 130*  K 4.0  CL 93*  CO2  24  GLUCOSE 129*  BUN 17  CREATININE 0.63  CALCIUM 8.3*    CBC: Recent Labs  Lab 03/19/19 1249 03/22/19 0234  WBC 13.8* 13.4*  NEUTROABS 11.7* 11.3*  HGB 8.2* 7.9*  HCT 25.2* 24.0*  MCV 82.6 81.6  PLT 618* 689*    CBG: No results for input(s): GLUCAP in the last 168 hours.  Family history.  Mother with stroke father with CAD.  Denies cancer  Brief HPI:    Nancy MarusRebecca Baca is a 78 year old right-handed female with past medical history of right SAH from a fall in May 2018, hypothyroidism, tobacco abuse, back surgery.  Per chart review lives alone 1 level home with ramped entrance.  Ambulated with a rolling walker since her back surgery.  She does have local family that check on her routinely but cannot provide full assistance.  Presented 02/20/2019 with acute left-sided weakness.  Denied any fall or trauma.  MRI of the brain showed small acute infarct in the right corona radiata.  MRA of head negative for large vessel occlusion.  MRA of the neck showed a 7 mm aneurysm right MCA bifurcation.  Patient did not receive TPA.  Echocardiogram with ejection fraction of 65% normal systolic function no source of embolism.  Cerebral arteriogram completed 02/22/2019 findings of a 1.8 x 7.7 right MCA aneurysm neurology consulted maintain on aspirin and Plavix for CVA prophylaxis.  Plan for outpatient coiling of cerebral aneurysm per interventional radiology.  Tolerating a regular diet.  Patient was admitted for a comprehensive rehab program.  Hospital Course: Nancy MarusRebecca Jaffer was admitted to rehab 02/23/2019 for inpatient therapies to consist of PT, ST and OT at least three hours five days a week. Past admission physiatrist, therapy team and rehab RN have worked together to provide customized collaborative inpatient rehab.  Pertaining to patient right corona radiata infarction as well as 7 mm right MCA aneurysm with left hemiparesis and dysarthria.  She remained on aspirin and Plavix therapy followed by  neurology services.  SCDs for DVT prophylaxis negative venous Dopplers.  Plan is for outpatient  coiling of aneurysm per interventional radiology.  Pain management use of heating pad for low back.  Initial attempts at tramadol and Flexeril for headaches discontinued due to some lethargy.  She was tolerating a mechanical soft diet with thin liquids.  A telemetry sitter was needed for patient safety needing significant encouragement at times to participate with therapies.  Latest cranial CT scan showed no evidence of acute infarction no hydrocephalus noted extensive chronic microvascular ischemic changes.  Blood pressure monitored initially on Norvasc decreased to 5 mg on 526 discontinued 03/15/2019 due to daughter's concern for side effects maintained on cozaar and increased to 25 mg on 03/20/2019.  Acute blood loss anemia noted Hemoccult ordered positive repeat testing negative hemoglobin 7.9 on 521 iron supplement added gastroenterology consulted EGD completed showing nonbleeding gastric ulcer erosive gastropathy recommending PPI twice daily x8 weeks then daily recommended Carafate 1 g 4 times daily for 2 weeks.  Transit constipation resolved with laxative assistance.  Patient intermittent bouts of confusion suspect vascular dementia confirmed by daughter.  Urine culture with MRSA completed course of Macrobid 03/07/2019 through 03/14/2019 repeat urine study relatively unremarkable contact precautions as noted.  During work-up of urine study a CT the chest was completed showing emphysema with scattered pulmonary nodules with outpatient follow-up scan recommended.  Ammonia levels were normal.  Hip osteoarthritis bilateral x-rays no fracture noting osteoarthritis.  History of tobacco abuse with NicoDerm patch added    Rehab course: During patient's stay in rehab weekly team conferences were held to monitor patient's progress, set goals and discuss barriers to discharge. At admission, patient required supervision supine  to sit minimal assist sit to supine.  Ambulating 75 feet rolling walker.  Moderate assist upper body bathing moderate assist upper body dressing max assist lower body dressing minimal assist toileting  Physical exam.  Blood pressure 133/64 pulse 76 temperature 98.5 respirations 16 oxygen saturations 94% room air Constitutional.  Well-developed no distress HEENT Head.  Normocephalic and atraumatic Eyes.  Pupils round and reactive to light EOMs normal Neck.  Supple nontender no tracheal deviation no JVD Cardiovascular normal rate no friction rub or murmur heard Respiratory effort normal no respiratory distress no wheezes GI soft nontender without rebound Neurological.  Makes good eye contact with examiner.  Follows simple commands provided her name.  Speech was dysarthric  She  has had improvement in activity tolerance, balance, postural control as well as ability to compensate for deficits. She has had improvement in functional use RUE/LUE  and RLE/LLE as well as improvement in awareness.  Side to sit max assist to assist legs off bed and patient pulling up on physical therapist.  To bedside commode on steady total assist minimal assist to doff pants then to sit on bedside commode.  Assisted to stand to steady with moderate assist.  Transition from supine to sitting edge of bed with max assist.  Patient needed encouragement to participate.  Transferred with a stand pivot transfer with NDT to left knee to bedside commode at total assist.  Due to limited advances it was felt skilled nursing facility was needed bed became available 03/27/2019       Disposition: Discharge to skilled nursing facility   Diet: Mechanical soft  Special Instructions: No smoking Follow-up interventional radiology for outpatient coiling of aneurysm  Follow-up CT of the chest for scattered pulmonary nodules  Aspirin and Plavix x3 weeks then Plavix alone  Medications at discharge. 1.  Tylenol as needed 2.  Aspirin  81 mg p.o. daily 3.  Plavix 75 mg p.o. daily 4.  Voltaren gel 2 g 4 times daily 5.  Niferex 150 mg p.o. daily 6.  Cozaar 25 mg p.o. daily 7.  Multivitamin daily 8.  NicoDerm patch 14 mg taper as needed 9.  Protonix 40 mg p.o. twice daily x8 weeks then daily 10.  MiraLAX twice daily hold for loose stools 11.  Carafate 1 g 4 times daily for 2 weeks and stop 12Lipitor 40 mg daily   Discharge Instructions    Ambulatory referral to Neurology   Complete by:  As directed    An appointment is requested in approximately 4 weeks right corona radiata infarction   Ambulatory referral to Physical Medicine Rehab   Complete by:  As directed    Moderate complexity follow up one month ?SNF right BG infarction       Contact information for follow-up providers    Marcello Fennel, MD Follow up.   Specialty:  Physical Medicine and Rehabilitation Why:  office to call for appointment Contact information: [address] [place] [address] [phone]        Julieanne Cotton, MD Follow up.   Specialties:  Interventional Radiology, Radiology Why:  call for appointment Contact information: [address] [place] [address] [phone]            Contact information for after-discharge care    Destination    [facility] Preferred SNF .   Service:  Skilled Nursing Contact information: [address] [place] [address] [phone]                  Signed: Charlton Amor 03/26/2019, 5:23 AM Patient seen and examined by me on day of discharge. Maryla Morrow, MD, ABPMR

## 2019-03-25 NOTE — Progress Notes (Signed)
[place] PHYSICAL MEDICINE & REHABILITATION PROGRESS NOTE   Subjective/Complaints: Patient without new issues.  ROS: Limited due to cognition, but appears to deny CP, shortness of breath, nausea, vomiting, diarrhea.  Objective:   No results found. No results for input(s): WBC, HGB, HCT, PLT in the last 72 hours. No results for input(s): NA, K, CL, CO2, GLUCOSE, BUN, CREATININE, CALCIUM in the last 72 hours.  Intake/Output Summary (Last 24 hours) at 03/25/2019 0758 Last data filed at 03/24/2019 1858 Gross per 24 hour  Intake 840 ml  Output -  Net 840 ml     Physical Exam: Vital Signs Blood pressure (!) 124/47, pulse 79, temperature 98.9 F (37.2 C), temperature source Oral, resp. rate 19, height 5\' 5"  (1.651 m), weight 78.5 kg, SpO2 95 %. Constitutional: No distress . Vital signs reviewed. HENT: Normocephalic.  Atraumatic Eyes: EOMI.  No discharge. Cardiovascular: No JVD. Respiratory: Normal effort. GI: Non-distended. Musc: Right hip TTP, unchanged. Neurologic: Alert and oriented x3 Motor:  Left upper extremity: Shoulder abduction 3/5, distally 4+/5, stable Left lower extremity: 4+/5 proximal to distal, stable Right lower extremity: 2/5 hip flexion (pain inhibition), knee extension, ankle dorsiflexion, (variable with participation), stable Skin: Warm and dry.  Intact.  Assessment/Plan: 1. Functional deficits secondary to right corona radiata infarct which require 3+ hours per day of interdisciplinary therapy in a comprehensive inpatient rehab setting.  Physiatrist is providing close team supervision and 24 hour management of active medical problems listed below.  Physiatrist and rehab team continue to assess barriers to discharge/monitor patient progress toward functional and medical goals  Care Tool:  Bathing  Bathing activity did not occur: Safety/medical concerns Body parts bathed by patient: Left arm, Chest, Abdomen, Front perineal area, Right upper leg, Left  upper leg   Body parts bathed by helper: Right arm, Buttocks, Right lower leg, Left lower leg     Bathing assist Assist Level: Moderate Assistance - Patient 50 - 74%     Upper Body Dressing/Undressing Upper body dressing   What is the patient wearing?: Pull over shirt    Upper body assist Assist Level: Maximal Assistance - Patient 25 - 49%    Lower Body Dressing/Undressing Lower body dressing      What is the patient wearing?: Pants, Incontinence brief     Lower body assist Assist for lower body dressing: Total Assistance - Patient < 25%     Toileting Toileting    Toileting assist Assist for toileting: Dependent - Patient 0%     Transfers Chair/bed transfer  Transfers assist     Chair/bed transfer assist level: Total Assistance - Patient < 25%     Locomotion Ambulation   Ambulation assist   Ambulation activity did not occur: Safety/medical concerns(fatigue/pain )  Assist level: Moderate Assistance - Patient 50 - 74% Assistive device: Walker-rolling(and L hand splint) Max distance: 5'   Walk 10 feet activity   Assist  Walk 10 feet activity did not occur: Safety/medical concerns(fatigue/pain )        Walk 50 feet activity   Assist Walk 50 feet with 2 turns activity did not occur: Safety/medical concerns(fatigue/pain )         Walk 150 feet activity   Assist Walk 150 feet activity did not occur: Safety/medical concerns(fatigue/pain )         Walk 10 feet on uneven surface  activity   Assist Walk 10 feet on uneven surfaces activity did not occur: Safety/medical concerns(fatigue/pain )  Wheelchair     Assist Will patient use wheelchair at discharge?: Yes Type of Wheelchair: Manual    Wheelchair assist level: Dependent - Patient 0% Max wheelchair distance: 15'    Wheelchair 50 feet with 2 turns activity    Assist    Wheelchair 50 feet with 2 turns activity did not occur: Safety/medical concerns(fatigue/pain  )   Assist Level: Dependent - Patient 0%   Wheelchair 150 feet activity     Assist Wheelchair 150 feet activity did not occur: Safety/medical concerns(fatigue/pain )   Assist Level: Dependent - Patient 0%    Medical Problem List and Plan: 1.Left side weaknesssecondary to 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  Cont CIR, PT OT speech plan for SNF Plan outpatient coiling of aneurysm per interventional radiology  Will consider anti-spasticity medications if necessary, not appropriate at this point  CT head reviewed, unremarkable for acute intracranial process, but showing signs of microvascular changes 2. Antithrombotics: -DVT/anticoagulation:SCDs.  Lower extremity Dopplers negative DVT -antiplatelet therapy: aspirin 81 mg daily Plavix 75 mg daily 3 weeks and Plavix alone 3. Pain Management/migraine headaches:Tramadol and Flexeril DC'd  Added kpad for low back  Muscle rub ordered for left calf and right thigh- improved 4. Mood:Provide emotional support -antipsychotic agents: N/A 5. Neuropsych/vascular dementia: This patientis not capable of making decisions on herown behalf.  Continue tele-sitter for safety  Needs significant encouragement 6. Skin/Wound Care:Routine skin checks 7. Fluids/Electrolytes/Nutrition:Routine in and outs  D3 thins, advance diet as tolerated 8. 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 [facility] by Dr. Conchita ParisNundkumar 9. Hypertension. Vitals:   03/24/19 2049 03/25/19 0514  BP: (!) 121/45 (!) 124/47  Pulse: 80 79  Resp: 19 19  Temp: 98.8 F (37.1 C) 98.9 F (37.2 C)  SpO2: 97% 95%   Norvasc 10 mg daily, decreased to 5 mg on 4/26, DC'd on 5/14 due to daughter's concern of side effects  Losartan 12.5 daily started on 5/16 (daughter states she personally is able to  tolerate this), increased to 25 on 5/19  Relatively controlled on 5/23, will monitor for dizziness 10. Hyperlipidemia. Lipitor 11. Tobacco abuse. Counseling 12.  Hyperglycemia  Hemoglobin A1c 5.5 on 01/2019  P.o. intake remains inconsistent on 5/18 CBGs not monitored but will check glucose with morning labs  13.  Acute blood loss anemia no gross blood per rectum noted per staff  Hemoccult ordered and positive, repeat negative, positive again on 5/11  Hgb 7.9 on 5/21, labs ordered for Monday  Added Fe++ supp  GI consulted, appreciate recs and EGD- nonbleeding gastric ulcer and erosive gastropathy, recommended PPI twice daily x8 weeks then daily.  Recommended Carafate 1 g 4 times daily for 2 weeks. 14.  Hypoalbuminemia  Supplement initiated on 4/27 15.  Slow transit constipation  Bowel regimen increased on 5/3, increased again on 5/4  Improving 16.  Confusion-multifactorial  Vascular dementia, confirmed by daughter  WBCs 13.4 on 5/21, labs ordered for Monday  Improving overall  Urine culture with MRSA  Completed Macrobid started on 5/6-5/13  Repeat UA relatively unremarkable, repeat urine culture no growth  CT chest reviewed showing emphysema with scattered pulmonary nodules with outpatient follow-up scan.  Ammonia within normal limits  Lipitor, amlodipine DC'd per daughter request [id number].  Hip OA  Bilaterally noted on x-ray, personally reviewed.  Voltaren gel ordered with improvement 18.  Emphysema with scattered pulmonary  nodules  Repeat chest CT as outpatient  Noted on CT  ABG reviewed showing decreased PO2  Supplemental oxygen ordered 19.  Transaminitis  LFTs elevated, but improving on 5/18  Lipitor DC'd 20.  Hyponatremia  Sodium 130 on 5/18  Continue to monitor 21.  Tobacco abuse  Patient heavy smoker at baseline  Nicotine patch ordered on 5/14  LOS: 30 days A FACE TO FACE EVALUATION WAS PERFORMED  Erick Colace 03/25/2019, 7:58 AM

## 2019-03-25 NOTE — Progress Notes (Signed)
Physical Therapy Session Note  Patient Details  Name: Connie Oconnor MRN: [id number] Date of Birth: 31-Mar-1941  Today's Date: 03/25/2019 PT Individual Time: 1445-1530 PT Individual Time Calculation (min): 45 min   Short Term Goals: Week 4:  PT Short Term Goal 1 (Week 4): Patient will demonstrate slide board transfers with mod to max A +1 PT Short Term Goal 2 (Week 4): Patient will demonstrate sitting balance with min A at least 50% of the time to allow for functional sitting task EOB. PT Short Term Goal 3 (Week 4): Patient to perform sit to stand with Stedy with mod A +2 to ease toilet transfers  Skilled Therapeutic Interventions/Progress Updates:     Patient in bed upon PT arrival. Patient alert and agreeable to PT session, requesting pain medicine than to get to the [facility] at beginning of session. Reported 9/10 B hip pain with spasms, RN provided pain medicine during session.  Therapeutic Activity: Bed Mobility: Patient performed supine to/from sit with mod-max A. Required significantly increased time and encouragement to complete bed mobility, very limited by hip pain and maintained B hips in 90 degrees of flexion during mobility with visible spasm in quadriceps . She sat EOB for 10 minutes with max-min A for sitting balance with significant Oconnor lean. 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 Attempted to transfer to the [facility] using the [facility], but patient cried out in pain in sitting and was unwilling to attempt transfer due to pain and was assisted back to supine. She was incontinent of bowl and PT and NT assisted with peri-care and LB dressing in bed with total A and max A for rolling Oconnor and R.  Patient in bed at end of session with breaks locked, bed alarm set, repositioned with B LEs propped on 3 pillows, and all needs within reach.    Therapy Documentation Precautions:  Precautions Precautions:  Fall Precaution Comments: Oconnor hemi, back pain, needs time to initiate movements  Restrictions Weight Bearing Restrictions: No Vital Signs: Therapy Vitals Pulse Rate: 75 BP: (!) 136/54 Patient Position (if appropriate): Lying Oxygen Therapy SpO2: 100 % O2 Device: Room Air Pain: Pain Assessment Pain Scale: 0-10 Pain Score: 9 Pain Location: B hips Pain Intervention(s): repositioned; distraction    Therapy/Group: Individual Therapy  Connie Oconnor Adysen Raphael PT, DPT  03/25/2019, 4:41 PM

## 2019-03-26 ENCOUNTER — Inpatient Hospital Stay (HOSPITAL_COMMUNITY): Payer: Medicare Other | Admitting: Speech Pathology

## 2019-03-26 ENCOUNTER — Inpatient Hospital Stay (HOSPITAL_COMMUNITY): Payer: Medicare Other | Admitting: Occupational Therapy

## 2019-03-26 ENCOUNTER — Inpatient Hospital Stay (HOSPITAL_COMMUNITY): Payer: Medicare Other | Admitting: Physical Therapy

## 2019-03-26 DIAGNOSIS — F0150 Vascular dementia without behavioral disturbance: Secondary | ICD-10-CM

## 2019-03-26 LAB — CBC WITH DIFFERENTIAL/PLATELET
Abs Immature Granulocytes: 0.2 10*3/uL — ABNORMAL HIGH (ref 0.00–0.07)
Basophils Absolute: 0 10*3/uL (ref 0.0–0.1)
Basophils Relative: 0 %
Eosinophils Absolute: 0.1 10*3/uL (ref 0.0–0.5)
Eosinophils Relative: 1 %
HCT: 26.7 % — ABNORMAL LOW (ref 36.0–46.0)
Hemoglobin: 8.4 g/dL — ABNORMAL LOW (ref 12.0–15.0)
Immature Granulocytes: 1 %
Lymphocytes Relative: 7 %
Lymphs Abs: 1 10*3/uL (ref 0.7–4.0)
MCH: 25.9 pg — ABNORMAL LOW (ref 26.0–34.0)
MCHC: 31.5 g/dL (ref 30.0–36.0)
MCV: 82.4 fL (ref 80.0–100.0)
Monocytes Absolute: 0.8 10*3/uL (ref 0.1–1.0)
Monocytes Relative: 6 %
Neutro Abs: 11.9 10*3/uL — ABNORMAL HIGH (ref 1.7–7.7)
Neutrophils Relative %: 85 %
Platelets: 888 10*3/uL — ABNORMAL HIGH (ref 150–400)
RBC: 3.24 MIL/uL — ABNORMAL LOW (ref 3.87–5.11)
RDW: 14.4 % (ref 11.5–15.5)
WBC: 14 10*3/uL — ABNORMAL HIGH (ref 4.0–10.5)
nRBC: 0 % (ref 0.0–0.2)

## 2019-03-26 LAB — COMPREHENSIVE METABOLIC PANEL
ALT: 99 U/L — ABNORMAL HIGH (ref 0–44)
AST: 36 U/L (ref 15–41)
Albumin: 1.6 g/dL — ABNORMAL LOW (ref 3.5–5.0)
Alkaline Phosphatase: 84 U/L (ref 38–126)
Anion gap: 12 (ref 5–15)
BUN: 11 mg/dL (ref 8–23)
CO2: 25 mmol/L (ref 22–32)
Calcium: 8.5 mg/dL — ABNORMAL LOW (ref 8.9–10.3)
Chloride: 94 mmol/L — ABNORMAL LOW (ref 98–111)
Creatinine, Ser: 0.53 mg/dL (ref 0.44–1.00)
GFR calc Af Amer: >60 mL/min (ref >60)
GFR calc non Af Amer: >60 mL/min (ref >60)
Glucose, Bld: 116 mg/dL — ABNORMAL HIGH (ref 70–99)
Potassium: 4 mmol/L (ref 3.5–5.1)
Sodium: 131 mmol/L — ABNORMAL LOW (ref 135–145)
Total Bilirubin: 0.4 mg/dL (ref 0.3–1.2)
Total Protein: 6.7 g/dL (ref 6.5–8.1)

## 2019-03-26 NOTE — Progress Notes (Signed)
Occupational Therapy Discharge Summary  Patient Details  Name: Connie Oconnor MRN: [id number] Date of Birth: 17-Feb-1941   Patient has met 7 of 10 long term goals due to improved activity tolerance, improved balance, postural control, functional use of  LEFT upper extremity, improved attention, improved awareness and improved coordination.  Patient to discharge at [facility] Max Assist level.  Patient's care partner unavailable to provide the necessary physical and cognitive assistance at discharge.    Reasons goals not met: Pt's initial goals were downgraded on 03/08/19.  She still has not been able to meet the LTGs of standing balance, LB dressing, toileting, and shower transfers as she continues to require max A.  Recommendation:  Patient will benefit from ongoing skilled OT services in skilled nursing facility setting to continue to advance functional skills in the area of BADL.  Equipment: No equipment provided  Reasons for discharge: treatment goals met and discharge from hospital  Patient/family agrees with progress made and goals achieved: Yes  OT Discharge Precautions/Restrictions  Precautions Precaution Comments: L hemi, back and R hip pain, needs time to initiate movements  ADL   max - total A LB self care, toileting; min A UB bathing and grooming; mod A UB dressing Vision Baseline Vision/History: No visual deficits Patient Visual Report: No change from baseline Vision Assessment?: No apparent visual deficits Perception  Perception: Impaired(L inattention) Praxis Praxis: Impaired Praxis Impairment Details: Motor planning Cognition Overall Cognitive Status: Impaired/Different from baseline Arousal/Alertness: Awake/alert Orientation Level: Oriented X4 Focused Attention: Appears intact Sustained Attention: Impaired Sustained Attention Impairment: Verbal basic;Functional basic Memory: Impaired Memory Impairment: Decreased short term memory;Retrieval deficit Decreased  Short Term Memory: Functional basic Awareness: Appears intact Problem Solving: Impaired Problem Solving Impairment: Functional basic Reasoning: Impaired Safety/Judgment: Impaired Sensation Sensation Light Touch: Impaired by gross assessment Hot/Cold: Impaired by gross assessment Proprioception: Impaired by gross assessment Stereognosis: Impaired by gross assessment Coordination Gross Motor Movements are Fluid and Coordinated: No Fine Motor Movements are Fluid and Coordinated: No Coordination and Movement Description: L hemiplegia Motor  Motor Motor: Hemiplegia Motor - Skilled Clinical Observations: L UE > L LE  Mobility    max A squat pivot transfers and mod A bed mobility Trunk/Postural Assessment  Postural Control Postural Control: Deficits on evaluation(L lean in sitting, posterior lean with pain)  Balance Static Sitting Balance Static Sitting - Level of Assistance: 4: Min assist Static Sitting - Comment/# of Minutes: CGA Dynamic Sitting Balance Dynamic Sitting - Level of Assistance: 3: Mod assist Static Standing Balance Static Standing - Level of Assistance: 3: Mod assist Dynamic Standing Balance Dynamic Standing - Level of Assistance: 2: Max assist;1: +1 Total assist Extremity/Trunk Assessment RUE Assessment RUE Assessment: Within Functional Limits LUE Assessment Active Range of Motion (AROM) Comments: Pt is able to lift L arm to 40 degrees of shoulder flexion, she can now bring finger to nose. Pt is now using her LUE at a diminished level. General Strength Comments: improved attention to LUE   SAGUIER,JULIA 03/26/2019, 12:36 PM

## 2019-03-26 NOTE — Plan of Care (Signed)
  Problem: RH BOWEL ELIMINATION Goal: RH STG MANAGE BOWEL W/MEDICATION W/ASSISTANCE Description STG Manage Bowel with Medication with Assistance. min  Outcome: Progressing Flowsheets (Taken 03/26/2019 1528) STG: Pt will manage bowels with medication with assistance: 3-Moderate assistance   Problem: RH BLADDER ELIMINATION Goal: RH STG MANAGE BLADDER WITH ASSISTANCE Description STG Manage Bladder With Assistance Min  Outcome: Progressing Flowsheets (Taken 03/26/2019 1528) STG: Pt will manage bladder with assistance: 3-Moderate assistance   Problem: RH SAFETY Goal: RH STG ADHERE TO SAFETY PRECAUTIONS W/ASSISTANCE/DEVICE Description STG Adhere to Safety Precautions With Assistance/Device. Min  Outcome: Progressing Flowsheets (Taken 03/26/2019 1528) STG:Pt will adhere to safety precautions with assistance/device: 3-Moderate assistance   Problem: Consults Goal: RH STROKE PATIENT EDUCATION Description See Patient Education module for education specifics  Outcome: Progressing

## 2019-03-26 NOTE — Progress Notes (Signed)
Physical Therapy Discharge Summary  Patient Details  Name: Connie Oconnor MRN: [id number] Date of Birth: 1940/11/23  Today's Date: 03/26/2019 PT Individual Time: 0240-9735 PT Individual Time Calculation (min): 55 min    Patient has met 2 of 3 long term goals due to improved activity tolerance, improved balance, improved postural control and improved coordination.  Patient to discharge at a wheelchair level Total Assist.   Patient's care partner unavailable to provide the necessary physical and cognitive assistance at discharge.  Reasons goals not met: Pt continues to be limited by pain in BLE with movement and resists assist for fear of pain.   Recommendation:  Patient will benefit from ongoing skilled PT services in skilled nursing facility setting to continue to advance safe functional mobility, address ongoing impairments in balance, strength, initiation, safety, transfers, bedmobility, WC mobility, pain control, and minimize fall risk.  Equipment: No equipment provided  Reasons for discharge: discharge from hospital  Patient/family agrees with progress made and goals achieved: Yes   PT treatment:  Pt received supine in bed and agreeable to PT with effort from PT to convince pt to participate. Supine>sit transfer with max assist for trunk control, as pt unable to divide attention away from R hip pain. Sitting balance EOB x 45 minutes with min-supervision assist. Cues for decreased pushing with the RUE and improved midline orientation. Lateral scooting at EOB with mod-max assist from PT with PT to block the LLE and improve stability. PT instructed pt in Grad day assessment to measure progress toward goals. See below for details. Sit>supine wit max-total assist as listed below. Scooting up in bed with total assist from PT. Pt  left supine in bed with call bell in reach and all needs met.       PT Discharge Precautions/Restrictions Precautions Precaution Comments: L hemi, back and R  hip pain, needs time to initiate movements  Pain Pain Assessment Pain Scale: 0-10 Pain Score: 4  Pain Type: Chronic pain Pain Location: Hip Pain Orientation: Right Pain Intervention(s): Medication (See eMAR) Vision/Perception  Perception Perception: Impaired(L inattention) Praxis Praxis: Impaired Praxis Impairment Details: Motor planning  Cognition Overall Cognitive Status: Impaired/Different from baseline Arousal/Alertness: Awake/alert Orientation Level: Oriented X4 Focused Attention: Appears intact Sustained Attention: Impaired Sustained Attention Impairment: Verbal basic;Functional basic Memory: Impaired Memory Impairment: Decreased short term memory;Retrieval deficit Decreased Short Term Memory: Functional basic Awareness: Appears intact Problem Solving: Impaired Problem Solving Impairment: Functional basic Reasoning: Impaired Safety/Judgment: Impaired Sensation Sensation Light Touch: Impaired by gross assessment Hot/Cold: Impaired by gross assessment Proprioception: Impaired by gross assessment Stereognosis: Impaired by gross assessment Coordination Gross Motor Movements are Fluid and Coordinated: No Fine Motor Movements are Fluid and Coordinated: No Coordination and Movement Description: L hemiplegia Motor  Motor Motor: Hemiplegia Motor - Skilled Clinical Observations: L UE > L LE   Mobility Bed Mobility Bed Mobility: Rolling Right;Rolling Left;Sit to Supine;Supine to Sit Rolling Right: Moderate Assistance - Patient 50-74% Rolling Left: Moderate Assistance - Patient 50-74% Supine to Sit: Maximal Assistance - Patient - Patient 25-49% Sit to Supine: Maximal Assistance - Patient 25-49%(severe pain RLE and spasms in Bil hamstrings ) Transfers Transfers: Sit to Stand Sit to Stand: Total Assistance - Patient < 25%(pain in the R hip limits improved mobility) Stand Pivot Transfers: (refused ) Locomotion  Gait Ambulation: No Gait Gait: No Stairs / Additional  Locomotion Stairs: No Wheelchair Mobility Wheelchair Mobility: No(pt refused trasnfer to [facility])  Trunk/Postural Assessment  Postural Control Postural Control: Deficits on evaluation(L lean in sitting, posterior lean  with pain)  Balance Static Sitting Balance Static Sitting - Level of Assistance: 4: Min assist Static Sitting - Comment/# of Minutes: CGA Dynamic Sitting Balance Dynamic Sitting - Level of Assistance: 3: Mod assist Static Standing Balance Static Standing - Level of Assistance: 3: Mod assist Dynamic Standing Balance Dynamic Standing - Level of Assistance: 2: Max assist;1: +1 Total assist Extremity Assessment  RUE Assessment RUE Assessment: Within Functional Limits LUE Assessment Active Range of Motion (AROM) Comments: Pt is able to lift L arm to 40 degrees of shoulder flexion, she can now bring finger to nose. Pt is now using her LUE at a diminished level. General Strength Comments: improved attention to LUE RLE Assessment RLE Assessment: Exceptions to [facility] General Strength Comments: grossly 4/5 but cramps with exertion in HS and adductors.  LLE Assessment LLE Assessment: Exceptions to [facility] General Strength Comments: quad 4-/5, ankle dorsiflexion 4-/5, hip ABD seated 3/5, hip flexion 3/5     Lorie Phenix 03/26/2019, 4:27 PM

## 2019-03-26 NOTE — Progress Notes (Signed)
Speech Language Pathology Discharge Summary  Patient Details  Name: Connie Oconnor MRN: [id number] Date of Birth: 1941/09/30  Today's Date: 03/26/2019 SLP Individual Time: 1115-1155 SLP Individual Time Calculation (min): 40 min   Skilled Therapeutic Interventions: Skilled treatment session focused on cognitive goals. SLP facilitated session by administering the MoCA-BLIND due to upper extremity weakness. Patient scored 17/22 points with a score of 18 or above considered normal. Patient continues to demonstrate deficits in short-term recall and attention. However, patient independently recalled functional information like plan for d/c tomorrow and name of SNF. Patient transferred back to bed at end of session via the [facility] with extra time and Mod A verbal cues for problem solving. Patient left upright in bed with alarm on and all needs within reach. Continue with current plan of care.   Patient has met 5 of 5 long term goals.  Patient to discharge at overall Mod level.   Reasons goals not met: N/A   Clinical Impression/Discharge Summary: Patient has made functional gains and has met 5 of 5 LTGs this admission. Currently, patient is consuming Dys. 3 textures with thin liquids without overt s/s of aspiration with overall Min A/encouragement for PO intake. Patient also requires overall Mod A multimodal cues to complete functional and familiar tasks safely in regards to problem solving, attention, recall and awareness. Patient and family education ongoing. Patient would benefit from f/u SLP services to maximize her cognitive and swallowing function in order to reduce caregiver burden.   Care Partner:  Caregiver Able to Provide Assistance: Yes  Type of Caregiver Assistance: Physical;Cognitive  Recommendation:  Skilled Nursing facility  Rationale for SLP Follow Up: Reduce caregiver burden;Maximize cognitive function and independence;Maximize swallowing safety   Equipment: N/A   Reasons for  discharge: Treatment goals met;Discharged from hospital   Patient/Family Agrees with Progress Made and Goals Achieved: Yes    [place], [place] 03/26/2019, 12:05 PM

## 2019-03-26 NOTE — Progress Notes (Signed)
[place] PHYSICAL MEDICINE & REHABILITATION PROGRESS NOTE   Subjective/Complaints: Patient seen laying in bed this morning working with therapies.  She states she slept well overnight.  She states she is looking for discharge tomorrow.  Discussed with therapies hip pain.  ROS: Denies CP, shortness of breath, nausea, vomiting, diarrhea.  Objective:   No results found. Recent Labs    03/26/19 1107  WBC 14.0*  HGB 8.4*  HCT 26.7*  PLT 888*   Recent Labs    03/26/19 1107  NA 131*  K 4.0  CL 94*  CO2 25  GLUCOSE 116*  BUN 11  CREATININE 0.53  CALCIUM 8.5*    Intake/Output Summary (Last 24 hours) at 03/26/2019 1307 Last data filed at 03/25/2019 1826 Gross per 24 hour  Intake 497 ml  Output -  Net 497 ml     Physical Exam: Vital Signs Blood pressure (!) 120/47, pulse 70, temperature 97.9 F (36.6 C), temperature source Oral, resp. rate 16, height 5\' 5"  (1.651 m), weight 78.5 kg, SpO2 99 %. Constitutional: No distress . Vital signs reviewed. HENT: Normocephalic.  Atraumatic. Eyes: EOMI.  No discharge. Cardiovascular: No JVD. Respiratory: Normal effort. GI: Non-distended. Musc: Right hip TTP, stable. Neurologic: Alert and oriented x3 Motor:  Left upper extremity: Shoulder abduction 3/5, distally 4+/5, unchanged Left lower extremity: 4+/5 proximal to distal, unchanged Right lower extremity: 2/5 hip flexion (pain inhibition), knee extension, ankle dorsiflexion, (variable with participation), unchanged Skin: Warm and dry.  Intact.  Assessment/Plan: 1. Functional deficits secondary to right corona radiata infarct which require 3+ hours per day of interdisciplinary therapy in a comprehensive inpatient rehab setting.  Physiatrist is providing close team supervision and 24 hour management of active medical problems listed below.  Physiatrist and rehab team continue to assess barriers to discharge/monitor patient progress toward functional and medical goals  Care  Tool:  Bathing  Bathing activity did not occur: Safety/medical concerns Body parts bathed by patient: Left arm, Chest, Abdomen, Front perineal area, Right upper leg, Left upper leg   Body parts bathed by helper: Right arm, Buttocks, Right lower leg, Left lower leg     Bathing assist Assist Level: Moderate Assistance - Patient 50 - 74%     Upper Body Dressing/Undressing Upper body dressing   What is the patient wearing?: Pull over shirt    Upper body assist Assist Level: Moderate Assistance - Patient 50 - 74%    Lower Body Dressing/Undressing Lower body dressing      What is the patient wearing?: Pants, Incontinence brief     Lower body assist Assist for lower body dressing: Total Assistance - Patient < 25%     Toileting Toileting    Toileting assist Assist for toileting: Dependent - Patient 0%     Transfers Chair/bed transfer  Transfers assist     Chair/bed transfer assist level: Maximal Assistance - Patient 25 - 49%     Locomotion Ambulation   Ambulation assist   Ambulation activity did not occur: Safety/medical concerns(fatigue/pain )  Assist level: Moderate Assistance - Patient 50 - 74% Assistive device: Walker-rolling(and L hand splint) Max distance: 5'   Walk 10 feet activity   Assist  Walk 10 feet activity did not occur: Safety/medical concerns(fatigue/pain )        Walk 50 feet activity   Assist Walk 50 feet with 2 turns activity did not occur: Safety/medical concerns(fatigue/pain )         Walk 150 feet activity   Assist Walk 150  feet activity did not occur: Safety/medical concerns(fatigue/pain )         Walk 10 feet on uneven surface  activity   Assist Walk 10 feet on uneven surfaces activity did not occur: Safety/medical concerns(fatigue/pain )         Wheelchair     Assist Will patient use wheelchair at discharge?: Yes Type of Wheelchair: Manual    Wheelchair assist level: Dependent - Patient 0% Max  wheelchair distance: 30'    Wheelchair 50 feet with 2 turns activity    Assist    Wheelchair 50 feet with 2 turns activity did not occur: Safety/medical concerns(fatigue/pain )   Assist Level: Dependent - Patient 0%   Wheelchair 150 feet activity     Assist Wheelchair 150 feet activity did not occur: Safety/medical concerns(fatigue/pain )   Assist Level: Dependent - Patient 0%    Medical Problem List and Plan: 1.Left side weaknesssecondary to 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  Plan for discharge to SNF tomorrow  Will see patient in 1 month post discharge for hospital follow-up.  Weekend notes reviewed-stable Plan outpatient coiling of aneurysm per interventional radiology  Will consider anti-spasticity medications if necessary, not appropriate at this point  CT head reviewed, unremarkable for acute intracranial process, but showing signs of microvascular changes 2. Antithrombotics: -DVT/anticoagulation:SCDs.  Lower extremity Dopplers negative DVT -antiplatelet therapy: aspirin 81 mg daily Plavix 75 mg daily 3 weeks and Plavix alone 3. Pain Management/migraine headaches:Tramadol and Flexeril DC'd  Added kpad for low back  Muscle rub ordered for left calf and right thigh- improved 4. Mood:Provide emotional support -antipsychotic agents: N/A 5. Neuropsych/vascular dementia: This patientis not capable of making decisions on herown behalf.  Continue tele-sitter for safety  Needs significant encouragement 6. Skin/Wound Care:Routine skin checks 7. Fluids/Electrolytes/Nutrition:Routine in and outs  D3 thins, advance diet as tolerated 8. History of traumatic [facility] May [facility]. Patient seen in the past by Dr. Conchita Paris 9. Hypertension. Vitals:   03/25/19 2128 03/26/19 0620  BP: (!) 119/45 (!)  120/47  Pulse: 69 70  Resp: 15 16  Temp: 97.8 F (36.6 C) 97.9 F (36.6 C)  SpO2: 98% 99%   Norvasc 10 mg daily, decreased to 5 mg on 4/26, DC'd on 5/14 due to daughter's concern of side effects  Losartan 12.5 daily started on 5/16 (daughter states she personally is able to tolerate this), increased to 25 on 5/19  Relatively controlled on 5/25 10. Hyperlipidemia. Lipitor 11. Tobacco abuse. Counseling 12.  Hyperglycemia  Hemoglobin A1c 5.5 on 01/2019  Slightly elevated on 5/25 13.  Acute blood loss anemia no gross blood per rectum noted per staff  Hemoccult ordered and positive, repeat negative, positive again on 5/11  Hgb 8.4 on 5/25, improving  Added Fe++ supp  GI consulted, appreciate recs and EGD- nonbleeding gastric ulcer and erosive gastropathy, recommended PPI twice daily x8 weeks then daily.  Recommended Carafate 1 g 4 times daily for 2 weeks. 14.  Hypoalbuminemia  Supplement initiated on 4/27 15.  Slow transit constipation  Bowel regimen increased on 5/3, increased again on 5/4  Improving 16.  Confusion-multifactorial-improving  Vascular dementia, confirmed by daughter  WBCs 14.0 on 5/25-infectious work-up negative to date  Improving overall  Urine culture with MRSA  Completed Macrobid started on 5/6-5/13  Repeat UA relatively unremarkable, repeat urine culture no growth  CT chest reviewed showing emphysema with  scattered pulmonary nodules with outpatient follow-up scan.  Ammonia within normal limits  Lipitor, amlodipine DC'd per daughter request 70.  Hip OA  Bilaterally noted on x-ray, personally reviewed.  Voltaren gel ordered with improvement 18.  Emphysema with scattered pulmonary nodules  Repeat chest CT as outpatient  Noted on CT  ABG reviewed showing decreased PO2  Supplemental oxygen ordered 19.  Transaminitis  LFTs elevated, but improving on 5/18  Lipitor DC'd 20.  Hyponatremia  Sodium 130 on 5/18  Continue to monitor 21.  Tobacco abuse  Patient heavy  smoker at baseline  Nicotine patch ordered on 5/14  LOS: 31 days A FACE TO FACE EVALUATION WAS PERFORMED  Ephram Kornegay Karis Juba 03/26/2019, 1:07 PM

## 2019-03-26 NOTE — Progress Notes (Signed)
Occupational Therapy Session Note  Patient Details  Name: Connie Oconnor MRN: [id number] Date of Birth: 05/08/1941  Today's Date: 03/26/2019 OT Individual Time: 0830-0930 OT Individual Time Calculation (min): 60 min    Short Term Goals: Week 4:  OT Short Term Goal 1 (Week 4): Continue working toward established LTG  Skilled Therapeutic Interventions/Progress Updates:   Pain:  10/10 R  Hip pain with movement.  No pain at rest.    Pt seen this session to focus on functional mobility. Pt aware that she is being discharged tomorrow to a SNF.  Pt agreeable to B/D from bed level.  Her R hip pain is significant and limits her mobility a great deal.  Pt worked on rolling and partial bridging for LB self care bed level, including brief change. She does not tolerate much movement.  To sit to EOB pt rolled to L and then pushed up with mod A.  She had to sit for a few minutes due to pain.  Pt then completed bed to chair transfer with mod-max A using 4 small scoots to the R.  Once in chair, pt completed UB bathing with min A and donning shirt with mod A and cues.  Improved trunk control and cognition today.   Pt resting in w/c with belt alarm on and all needs met.  Therapy Documentation Precautions:  Precautions Precautions: Fall Precaution Comments: L hemi, back pain, needs time to initiate movements  Restrictions Weight Bearing Restrictions: No   Therapy/Group: Individual Therapy  [place] 03/26/2019, 12:13 PM

## 2019-03-27 MED ORDER — POLYSACCHARIDE IRON COMPLEX 150 MG PO CAPS: 150.0000 mg | ORAL_CAPSULE | Freq: Every day | ORAL | Status: DC

## 2019-03-27 MED ORDER — PANTOPRAZOLE SODIUM 40 MG PO TBEC: 40.0000 mg | DELAYED_RELEASE_TABLET | Freq: Two times a day (BID) | ORAL | Status: DC

## 2019-03-27 MED ORDER — ACETAMINOPHEN 325 MG PO TABS: 650.0000 mg | ORAL_TABLET | ORAL | Status: AC | PRN

## 2019-03-27 MED ORDER — ASPIRIN 81 MG PO TBEC: 81.0000 mg | DELAYED_RELEASE_TABLET | Freq: Every day | ORAL | 0 refills | Status: DC

## 2019-03-27 MED ORDER — DICLOFENAC SODIUM 1 % TD GEL: 2.0000 g | Freq: Four times a day (QID) | TRANSDERMAL | Status: AC

## 2019-03-27 MED ORDER — SENNOSIDES-DOCUSATE SODIUM 8.6-50 MG PO TABS: 2.0000 | ORAL_TABLET | Freq: Every day | ORAL | Status: DC

## 2019-03-27 MED ORDER — NICOTINE 14 MG/24HR TD PT24: 14.0000 mg | MEDICATED_PATCH | Freq: Every day | TRANSDERMAL | 0 refills | Status: DC

## 2019-03-27 MED ORDER — LOSARTAN POTASSIUM 25 MG PO TABS: 25.0000 mg | ORAL_TABLET | Freq: Every day | ORAL | Status: AC

## 2019-03-27 MED ORDER — ADULT MULTIVITAMIN W/MINERALS CH: 1.0000 | ORAL_TABLET | Freq: Every day | ORAL | Status: DC

## 2019-03-27 MED ORDER — POLYETHYLENE GLYCOL 3350 17 G PO PACK: 17.0000 g | PACK | Freq: Two times a day (BID) | ORAL | 0 refills | Status: AC

## 2019-03-27 NOTE — Progress Notes (Signed)
Report given to [facility]. All questions were answered.

## 2019-03-27 NOTE — Progress Notes (Signed)
Patient medically stable for discharge today.  Please see discharge summary as well.  WBCs remain elevated, however patient afebrile and work-up thus far has been negative/treated.  We will continue to monitor pulmonary nodules and consider further work-up if necessary.

## 2019-03-27 NOTE — Progress Notes (Signed)
Social Work  Discharge Note  The overall goal for the admission was met for:   Discharge location: Yes-[facility]  Length of Stay: No-32 DAYS  Discharge activity level: No-MIN-MOD LEVEL  Home/community participation: Yes  Services provided included: MD, RD, PT, OT, SLP, RN, CM, Pharmacy, Neuropsych and SW  Financial Services: Private Insurance: [facility]  Follow-up services arranged: Other: SHORT TERM NHP  Comments (or additional information):PT NEEDS MORE REHAB BEFORE RETURNING HOME WITH HER DAUGHTER'S.   Patient/Family verbalized understanding of follow-up arrangements: Yes  Individual responsible for coordination of the follow-up plan: [facility] AND CINDY-DAUGHTER  Confirmed correct DME delivered: [place], [place] 03/27/2019    Elease Hashimoto

## 2019-03-28 ENCOUNTER — Telehealth: Payer: Self-pay | Admitting: Adult Health

## 2019-03-28 ENCOUNTER — Other Ambulatory Visit (HOSPITAL_COMMUNITY): Payer: Self-pay | Admitting: Interventional Radiology

## 2019-03-28 DIAGNOSIS — I639 Cerebral infarction, unspecified: Secondary | ICD-10-CM

## 2019-03-28 NOTE — Telephone Encounter (Signed)
Spoke with Renne Crigler at [facility] [phone]) and scheduled a hospital follow up for patient. They are able to do televisits. I went over the [email or web address] process with her and let her know how the pt's insurance will be filed. E-mail was sent to [email or web address] with the appointment information, doxy link, and instructions for the visit. She will call and let us know if patient ends up being discharged before their apt.

## 2019-04-04 ENCOUNTER — Inpatient Hospital Stay (HOSPITAL_COMMUNITY)
Admission: EM | Admit: 2019-04-04 | Discharge: 2019-04-09 | DRG: 378 | Disposition: A | Discharge status: Skilled Nursing Facility | Payer: Medicare Other | Source: Skilled Nursing Facility | Attending: Internal Medicine | Admitting: Internal Medicine

## 2019-04-04 ENCOUNTER — Other Ambulatory Visit: Payer: Self-pay

## 2019-04-04 ENCOUNTER — Encounter (HOSPITAL_COMMUNITY): Payer: Self-pay

## 2019-04-04 DIAGNOSIS — D500 Iron deficiency anemia secondary to blood loss (chronic): Secondary | ICD-10-CM

## 2019-04-04 DIAGNOSIS — Z8711 Personal history of peptic ulcer disease: Secondary | ICD-10-CM

## 2019-04-04 DIAGNOSIS — K922 Gastrointestinal hemorrhage, unspecified: Secondary | ICD-10-CM | POA: Diagnosis not present

## 2019-04-04 DIAGNOSIS — K319 Disease of stomach and duodenum, unspecified: Secondary | ICD-10-CM | POA: Diagnosis present

## 2019-04-04 DIAGNOSIS — I69354 Hemiplegia and hemiparesis following cerebral infarction affecting left non-dominant side: Secondary | ICD-10-CM

## 2019-04-04 DIAGNOSIS — Z9181 History of falling: Secondary | ICD-10-CM

## 2019-04-04 DIAGNOSIS — M1990 Unspecified osteoarthritis, unspecified site: Secondary | ICD-10-CM | POA: Diagnosis present

## 2019-04-04 DIAGNOSIS — K449 Diaphragmatic hernia without obstruction or gangrene: Secondary | ICD-10-CM | POA: Diagnosis present

## 2019-04-04 DIAGNOSIS — Z823 Family history of stroke: Secondary | ICD-10-CM

## 2019-04-04 DIAGNOSIS — E785 Hyperlipidemia, unspecified: Secondary | ICD-10-CM | POA: Diagnosis not present

## 2019-04-04 DIAGNOSIS — E611 Iron deficiency: Secondary | ICD-10-CM | POA: Diagnosis present

## 2019-04-04 DIAGNOSIS — G8929 Other chronic pain: Secondary | ICD-10-CM | POA: Diagnosis present

## 2019-04-04 DIAGNOSIS — Z66 Do not resuscitate: Secondary | ICD-10-CM | POA: Diagnosis present

## 2019-04-04 DIAGNOSIS — K625 Hemorrhage of anus and rectum: Secondary | ICD-10-CM | POA: Diagnosis not present

## 2019-04-04 DIAGNOSIS — I10 Essential (primary) hypertension: Secondary | ICD-10-CM | POA: Diagnosis not present

## 2019-04-04 DIAGNOSIS — E876 Hypokalemia: Secondary | ICD-10-CM | POA: Diagnosis present

## 2019-04-04 DIAGNOSIS — Z8249 Family history of ischemic heart disease and other diseases of the circulatory system: Secondary | ICD-10-CM

## 2019-04-04 DIAGNOSIS — E739 Lactose intolerance, unspecified: Secondary | ICD-10-CM | POA: Diagnosis present

## 2019-04-04 DIAGNOSIS — Z880 Allergy status to penicillin: Secondary | ICD-10-CM

## 2019-04-04 DIAGNOSIS — Z888 Allergy status to other drugs, medicaments and biological substances status: Secondary | ICD-10-CM

## 2019-04-04 DIAGNOSIS — Z87891 Personal history of nicotine dependence: Secondary | ICD-10-CM

## 2019-04-04 DIAGNOSIS — D62 Acute posthemorrhagic anemia: Secondary | ICD-10-CM | POA: Diagnosis present

## 2019-04-04 DIAGNOSIS — Z7902 Long term (current) use of antithrombotics/antiplatelets: Secondary | ICD-10-CM

## 2019-04-04 DIAGNOSIS — D649 Anemia, unspecified: Secondary | ICD-10-CM

## 2019-04-04 DIAGNOSIS — Z90710 Acquired absence of both cervix and uterus: Secondary | ICD-10-CM

## 2019-04-04 DIAGNOSIS — Z20828 Contact with and (suspected) exposure to other viral communicable diseases: Secondary | ICD-10-CM | POA: Diagnosis present

## 2019-04-04 DIAGNOSIS — K210 Gastro-esophageal reflux disease with esophagitis: Secondary | ICD-10-CM | POA: Diagnosis present

## 2019-04-04 DIAGNOSIS — J439 Emphysema, unspecified: Secondary | ICD-10-CM | POA: Diagnosis present

## 2019-04-04 HISTORY — DX: Cerebral infarction, unspecified: I63.9

## 2019-04-04 LAB — CBC WITH DIFFERENTIAL/PLATELET
Abs Immature Granulocytes: 0.05 10*3/uL (ref 0.00–0.07)
Basophils Absolute: 0 10*3/uL (ref 0.0–0.1)
Basophils Relative: 0 %
Eosinophils Absolute: 0.1 10*3/uL (ref 0.0–0.5)
Eosinophils Relative: 1 %
HCT: 22.5 % — ABNORMAL LOW (ref 36.0–46.0)
Hemoglobin: 6.9 g/dL — CL (ref 12.0–15.0)
Immature Granulocytes: 1 %
Lymphocytes Relative: 10 %
Lymphs Abs: 0.9 10*3/uL (ref 0.7–4.0)
MCH: 25.4 pg — ABNORMAL LOW (ref 26.0–34.0)
MCHC: 30.7 g/dL (ref 30.0–36.0)
MCV: 82.7 fL (ref 80.0–100.0)
Monocytes Absolute: 0.7 10*3/uL (ref 0.1–1.0)
Monocytes Relative: 8 %
Neutro Abs: 7.4 10*3/uL (ref 1.7–7.7)
Neutrophils Relative %: 80 %
Platelets: 665 10*3/uL — ABNORMAL HIGH (ref 150–400)
RBC: 2.72 MIL/uL — ABNORMAL LOW (ref 3.87–5.11)
RDW: 15.2 % (ref 11.5–15.5)
WBC: 9.1 10*3/uL (ref 4.0–10.5)
nRBC: 0 % (ref 0.0–0.2)

## 2019-04-04 LAB — BASIC METABOLIC PANEL
Anion gap: 10 (ref 5–15)
BUN: 11 mg/dL (ref 8–23)
CO2: 25 mmol/L (ref 22–32)
Calcium: 8 mg/dL — ABNORMAL LOW (ref 8.9–10.3)
Chloride: 98 mmol/L (ref 98–111)
Creatinine, Ser: 0.49 mg/dL (ref 0.44–1.00)
GFR calc Af Amer: >60 mL/min (ref >60)
GFR calc non Af Amer: >60 mL/min (ref >60)
Glucose, Bld: 98 mg/dL (ref 70–99)
Potassium: 3.7 mmol/L (ref 3.5–5.1)
Sodium: 133 mmol/L — ABNORMAL LOW (ref 135–145)

## 2019-04-04 LAB — POC OCCULT BLOOD, ED: Fecal Occult Bld: POSITIVE — AB

## 2019-04-04 LAB — ABO/RH: ABO/RH(D): A POS

## 2019-04-04 LAB — SARS CORONAVIRUS 2 BY RT PCR (HOSPITAL ORDER, PERFORMED IN ~~LOC~~ HOSPITAL LAB): SARS Coronavirus 2: NEGATIVE

## 2019-04-04 LAB — PREPARE RBC (CROSSMATCH)

## 2019-04-04 MED ORDER — PANTOPRAZOLE SODIUM 40 MG IV SOLR
40.0000 mg | Freq: Two times a day (BID) | INTRAVENOUS | Status: DC
  Administered 2019-04-04 – 2019-04-07 (×7): 40 mg via INTRAVENOUS
  Filled 2019-04-04 (×7): qty 40

## 2019-04-04 MED ORDER — SODIUM CHLORIDE 0.9 % IV SOLN: 10.0000 mL/h | Freq: Once | INTRAVENOUS | Status: DC

## 2019-04-04 MED ORDER — PANTOPRAZOLE SODIUM 40 MG IV SOLR
40.0000 mg | Freq: Once | INTRAVENOUS | Status: AC
  Administered 2019-04-04: 15:00:00 40 mg via INTRAVENOUS
  Filled 2019-04-04: qty 40

## 2019-04-04 MED ORDER — ONDANSETRON HCL 4 MG/2ML IJ SOLN: 4.0000 mg | Freq: Four times a day (QID) | INTRAMUSCULAR | Status: DC | PRN

## 2019-04-04 MED ORDER — SODIUM CHLORIDE 0.9 % IV SOLN
INTRAVENOUS | Status: DC
  Administered 2019-04-04: 14:00:00 via INTRAVENOUS

## 2019-04-04 MED ORDER — MUSCLE RUB 10-15 % EX CREA
TOPICAL_CREAM | CUTANEOUS | Status: DC | PRN
  Filled 2019-04-04: qty 85

## 2019-04-04 MED ORDER — MORPHINE SULFATE (PF) 2 MG/ML IV SOLN
1.0000 mg | INTRAVENOUS | Status: DC | PRN
  Administered 2019-04-06 – 2019-04-08 (×4): 1 mg via INTRAVENOUS
  Filled 2019-04-04 (×4): qty 1

## 2019-04-04 MED ORDER — SODIUM CHLORIDE 0.9 % IV SOLN
INTRAVENOUS | Status: DC
  Administered 2019-04-04 – 2019-04-06 (×5): via INTRAVENOUS

## 2019-04-04 MED ORDER — ONDANSETRON HCL 4 MG PO TABS: 4.0000 mg | ORAL_TABLET | Freq: Four times a day (QID) | ORAL | Status: DC | PRN

## 2019-04-04 NOTE — ED Triage Notes (Signed)
Pt BIB EMS form [facility] where she is doing rehab for a stroke. Hemoglobin 6.6 at facility today. Pts only complaint is pain in knee. Facility has not noticed any signs of bleeding. Pt A&O x4.   128/72 HR 76 94% RA RR 18 Temp 98.2

## 2019-04-04 NOTE — H&P (Signed)
History and Physical    Connie Oconnor ACZ:[id number] DOB: [birth year] DOA: 04/04/2019  PCP: Heide Scales, PA-C  Patient coming from: SNF  Chief Complaint: Anemia   HPI: Connie Oconnor is a 78 y.o. female with medical history significant of right SAH from a fall in May 2018, hypothyroidism, tobacco abuse, back surgery. She was hospitalized in April 2020 due to acute left-sided weakness, MRI revealed stroke. Patient remained on aspirin, plavix and sent to rehab. During that admission, she also had acute blood loss anemia with positive hemoccult. She underwent EGD showing nonbleeding gastric ulcer erosive gastropathy recommending PPI twice daily x8 weeks then daily recommended Carafate 1 g 4 times daily for 2 weeks.   EGD 03/20/2019 Dr. Orvan Falconer revealed: LA Grade B reflux esophagitis. - Small hiatal hernia without Cameron's erosions. - Non-bleeding gastric ulcer with no stigmata of bleeding. - Non-bleeding erosive gastropathy. - Normal examined duodenum. - The examination was otherwise normal. - No specimens collected.  Colonoscopy 2009: normal exam at that time  Her main complaint is weakness and fatigue.  She denies any dizziness or lightheadedness, no chest pain, nausea, vomiting or abdominal pain.  She also admits to some mild shortness of breath without any cough.  No fevers.  ED Course: Hgb 6.9. BUN 11. FOBT positive. SARS-CoV2 negative.   Review of Systems: As per HPI otherwise 10 point review of systems negative.   Past Medical History:  Diagnosis Date  . Anemia    hx of  . Arthritis   . Dislocation of right knee with medial meniscus tear   . Headache    hx of migraines none since 1990's  . Hypothyroidism    1978, no problems since  . Pneumonia 1990's none since  . PVC (premature ventricular contraction)    199'0's none since  . SAH (subarachnoid hemorrhage) (HCC) 2016   On the right  . Stroke [facility])     Past Surgical History:  Procedure Laterality Date  .  BACK SURGERY    . CERVICAL LAMINECTOMY  1986  . DILATION AND CURETTAGE OF UTERUS  1970  . ELBOW SURGERY Right 1993  . ESOPHAGOGASTRODUODENOSCOPY N/A 03/20/2019   Procedure: ESOPHAGOGASTRODUODENOSCOPY (EGD);  Surgeon: Tressia Danas, MD;  Location: [facility] ENDOSCOPY;  Service: Gastroenterology;  Laterality: N/A;  . IR ANGIO INTRA EXTRACRAN SEL COM CAROTID INNOMINATE BILAT MOD SED  02/22/2019  . IR ANGIO VERTEBRAL SEL SUBCLAVIAN INNOMINATE UNI L MOD SED  02/22/2019  . IR ANGIO VERTEBRAL SEL VERTEBRAL UNI R MOD SED  02/22/2019  . KNEE ARTHROSCOPY WITH SUBCHONDROPLASTY Right 01/11/2018   Procedure: KNEE ARTHROSCOPY WITH SUBCHONDROPLASTY, PARTIAL MEDIAL MENISCECTOMY, CHONDROPLASTY OF THE MEDIAL PATELLA FEMORAL CHONDYL, MEDIAL PLICA  EXCISION;  Surgeon: Jodi Geralds, MD;  Location: [facility] ORS;  Service: Orthopedics;  Laterality: Right;  . LUMBAR LAMINECTOMY  1993   rods placed bone generated removed one year later   . MULTIPLE TOOTH EXTRACTIONS    . SHOULDER SURGERY Left    arthritis clean out and shortened collar bone dr Renae Fickle  . TONSILLECTOMY  1970  . VAGINAL HYSTERECTOMY  1971   partial dr Dewaine Conger  . WISDOM TOOTH EXTRACTION       reports that she has been smoking cigarettes. She has a 72.00 pack-year smoking history. She has never used smokeless tobacco. She reports that she does not drink alcohol or use drugs.  Allergies  Allergen Reactions  . Lactose Intolerance (Gi) Diarrhea    Ensure causing diarrhea.  . Aspirin Other (See Comments)  shaking  . Ibuprofen Other (See Comments)    Shaking  . Penicillins Rash and Other (See Comments)    Has patient had a PCN reaction causing immediate rash, facial/tongue/throat swelling, SOB or lightheadedness with hypotension: No Has patient had a PCN reaction causing severe rash involving mucus membranes or skin necrosis: No Has patient had a PCN reaction that required hospitalization: Yes - was in hospital Has patient had a PCN reaction occurring within the  last 10 years: No If all of the above answers are "NO", then may proceed with Cephalosporin use.     Family History  Problem Relation Age of Onset  . Stroke Mother   . Heart attack Father     Prior to Admission medications   Medication Sig Start Date End Date Taking? Authorizing Provider  acetaminophen (TYLENOL) 325 MG tablet Take 2 tablets (650 mg total) by mouth every 4 (four) hours as needed for mild pain (or temp > 37.5 C (99.5 F)). 03/27/19  Yes Angiulli, Mcarthur Rossetti, PA-C  atorvastatin (LIPITOR) 40 MG tablet Take 1 tablet (40 mg total) by mouth daily at 6 PM. 02/23/19  Yes Azucena Fallen, MD  clopidogrel (PLAVIX) 75 MG tablet Take 1 tablet (75 mg total) by mouth daily. 02/24/19  Yes Azucena Fallen, MD  diclofenac sodium (VOLTAREN) 1 % GEL Apply 2 g topically 4 (four) times daily. 03/27/19  Yes Angiulli, Mcarthur Rossetti, PA-C  docusate sodium (COLACE) 100 MG capsule Take 100 mg by mouth daily.   Yes [provider]  gabapentin (NEURONTIN) 100 MG capsule Take 100 mg by mouth 3 (three) times daily.   Yes [provider]  iron polysaccharides (NIFEREX) 150 MG capsule Take 1 capsule (150 mg total) by mouth daily. 03/27/19  Yes Angiulli, Mcarthur Rossetti, PA-C  losartan (COZAAR) 25 MG tablet Take 1 tablet (25 mg total) by mouth daily. 03/27/19  Yes Angiulli, Mcarthur Rossetti, PA-C  magnesium oxide (MAG-OX) 400 MG tablet Take 400 mg by mouth daily.   Yes [provider]  Melatonin 3 MG TABS Take 6 mg by mouth at bedtime.   Yes [provider]  Multiple Vitamin (MULTIVITAMIN WITH MINERALS) TABS tablet Take 1 tablet by mouth daily. 03/27/19  Yes Angiulli, Mcarthur Rossetti, PA-C  nicotine (NICODERM CQ - DOSED IN MG/24 HOURS) 14 mg/24hr patch Place 1 patch (14 mg total) onto the skin daily. 03/27/19  Yes Angiulli, Mcarthur Rossetti, PA-C  pantoprazole (PROTONIX) 40 MG tablet Take 1 tablet (40 mg total) by mouth 2 (two) times daily before a meal. 03/27/19  Yes Angiulli, Mcarthur Rossetti, PA-C  polyethylene  glycol (MIRALAX / GLYCOLAX) 17 g packet Take 17 g by mouth 2 (two) times daily. 03/27/19  Yes Angiulli, Mcarthur Rossetti, PA-C  sucralfate (CARAFATE) 1 g tablet Take 1 g by mouth 4 (four) times daily. 04/04/19 04/18/19 Yes [provider]  aspirin 81 MG EC tablet Take 1 tablet (81 mg total) by mouth daily for 21 days. Patient not taking: Reported on 04/04/2019 03/27/19 04/17/19  Angiulli, Mcarthur Rossetti, PA-C  senna-docusate (SENOKOT-S) 8.6-50 MG tablet Take 2 tablets by mouth at bedtime. Patient not taking: Reported on 04/04/2019 03/27/19   Charlton Amor, PA-C    Physical Exam: Vitals:   04/04/19 1600 04/04/19 1613 04/04/19 1700 04/04/19 1730  BP: (!) 120/58 (!) 130/42 107/78 (!) 123/44  Pulse: 76 77 77 77  Resp: (!) 21 18 (!) 22 20  Temp:  98.8 F (37.1 C)    TempSrc:  Oral  SpO2: 98% 98% 98% 98%  Weight:      Height:         Constitutional: NAD, calm, comfortable Eyes: PERRL, lids and conjunctivae normal ENMT: Mucous membranes are dry. Posterior pharynx clear of any exudate or lesions.Normal dentition.  Neck: normal, supple, no masses, no thyromegaly Respiratory: clear to auscultation bilaterally, no wheezing, no crackles. Normal respiratory effort. No accessory muscle use.  Cardiovascular: Regular rate and rhythm, no murmurs / rubs / gallops. No extremity edema. Abdomen: no tenderness, no masses palpated. No hepatosplenomegaly. Bowel sounds positive.  Musculoskeletal: no clubbing / cyanosis. No joint deformity upper and lower extremities. Good ROM, no contractures. Normal muscle tone.  Skin: no rashes, lesions, ulcers. No induration Neurologic: Alert and oriented, nonfocal examination, speech clear Psychiatric: Normal judgment and insight. Alert and oriented x 3. Normal mood.   Labs on Admission: I have personally reviewed following labs and imaging studies  CBC: Recent Labs  Lab 04/04/19 1320  WBC 9.1  NEUTROABS 7.4  HGB 6.9*  HCT 22.5*  MCV 82.7  PLT 665*   Basic  Metabolic Panel: Recent Labs  Lab 04/04/19 1320  NA 133*  K 3.7  CL 98  CO2 25  GLUCOSE 98  BUN 11  CREATININE 0.49  CALCIUM 8.0*   GFR: Estimated Creatinine Clearance: 61 mL/min (by C-G formula based on SCr of 0.49 mg/dL). Liver Function Tests: No results for input(s): AST, ALT, ALKPHOS, BILITOT, PROT, ALBUMIN in the last 168 hours. No results for input(s): LIPASE, AMYLASE in the last 168 hours. No results for input(s): AMMONIA in the last 168 hours. Coagulation Profile: No results for input(s): INR, PROTIME in the last 168 hours. Cardiac Enzymes: No results for input(s): CKTOTAL, CKMB, CKMBINDEX, TROPONINI in the last 168 hours. BNP (last 3 results) No results for input(s): PROBNP in the last 8760 hours. HbA1C: No results for input(s): HGBA1C in the last 72 hours. CBG: No results for input(s): GLUCAP in the last 168 hours. Lipid Profile: No results for input(s): CHOL, HDL, LDLCALC, TRIG, CHOLHDL, LDLDIRECT in the last 72 hours. Thyroid Function Tests: No results for input(s): TSH, T4TOTAL, FREET4, T3FREE, THYROIDAB in the last 72 hours. Anemia Panel: No results for input(s): VITAMINB12, FOLATE, FERRITIN, TIBC, IRON, RETICCTPCT in the last 72 hours. Urine analysis:    Component Value Date/Time   COLORURINE YELLOW 03/13/2019 1545   APPEARANCEUR CLOUDY (A) 03/13/2019 1545   LABSPEC 1.014 03/13/2019 1545   PHURINE 7.0 03/13/2019 1545   GLUCOSEU NEGATIVE 03/13/2019 1545   HGBUR NEGATIVE 03/13/2019 1545   BILIRUBINUR NEGATIVE 03/13/2019 1545   KETONESUR NEGATIVE 03/13/2019 1545   PROTEINUR NEGATIVE 03/13/2019 1545   NITRITE NEGATIVE 03/13/2019 1545   LEUKOCYTESUR NEGATIVE 03/13/2019 1545   Sepsis Labs: !!!!!!!!!!!!!!!!!!!!!!!!!!!!!!!!!!!!!!!!!!!! (procalcitonin:4,lacticidven:4) ) Recent Results (from the past 240 hour(s))  SARS Coronavirus 2 (CEPHEID - Performed in [facility] hospital lab), Hosp Order     Status: None   Collection Time: 04/04/19  2:28 PM   Result Value Ref Range Status   SARS Coronavirus 2 NEGATIVE NEGATIVE Final    Comment: (NOTE) If result is NEGATIVE SARS-CoV-2 target nucleic acids are NOT DETECTED. The SARS-CoV-2 RNA is generally detectable in upper and lower  respiratory specimens during the acute phase of infection. The lowest  concentration of SARS-CoV-2 viral copies this assay can detect is 250  copies / mL. A negative result does not preclude SARS-CoV-2 infection  and should not be used as the sole basis for treatment or other  patient management  decisions.  A negative result may occur with  improper specimen collection / handling, submission of specimen other  than nasopharyngeal swab, presence of viral mutation(s) within the  areas targeted by this assay, and inadequate number of viral copies  (<250 copies / mL). A negative result must be combined with clinical  observations, patient history, and epidemiological information. If result is POSITIVE SARS-CoV-2 target nucleic acids are DETECTED. The SARS-CoV-2 RNA is generally detectable in upper and lower  respiratory specimens dur ing the acute phase of infection.  Positive  results are indicative of active infection with SARS-CoV-2.  Clinical  correlation with patient history and other diagnostic information is  necessary to determine patient infection status.  Positive results do  not rule out bacterial infection or co-infection with other viruses. If result is PRESUMPTIVE POSTIVE SARS-CoV-2 nucleic acids MAY BE PRESENT.   A presumptive positive result was obtained on the submitted specimen  and confirmed on repeat testing.  While 2019 novel coronavirus  (SARS-CoV-2) nucleic acids may be present in the submitted sample  additional confirmatory testing may be necessary for epidemiological  and / or clinical management purposes  to differentiate between  SARS-CoV-2 and other Sarbecovirus currently known to infect humans.  If clinically indicated additional  testing with an alternate test  methodology [phone]([id number]) is advised. The SARS-CoV-2 RNA is generally  detectable in upper and lower respiratory sp ecimens during the acute  phase of infection. The expected result is Negative. Fact Sheet for Patients:  BoilerBrush.com.cyhttps://www.fda.gov/media/136312/download Fact Sheet for Healthcare Providers: https://pope.com/https://www.fda.gov/media/136313/download This test is not yet approved or cleared by the [place] FDA and has been authorized for detection and/or diagnosis of SARS-CoV-2 by FDA under an Emergency Use Authorization (EUA).  This EUA will remain in effect (meaning this test can be used) for the duration of the COVID-19 declaration under Section 564(b)(1) of the Act, 21 U.S.C. section 360bbb-3(b)(1), unless the authorization is terminated or revoked sooner. Performed at [facility], [address] [place], [place] [id number]      Radiological Exams on Admission: No results found.    Assessment/Plan Principal Problem:   Acute blood loss anemia Active Problems:   Essential hypertension   GI bleed   HLD (hyperlipidemia)   Acute blood loss anemia -Previous EGD revealed esophagitis, nonbleeding gastric ulcer, nonbleeding erosive gastropathy -PPI IV  -Carafate once able to take PO  -GI consulted in the ED -Trend H&H   Recent hx right lacunar infarct -Hold aspirin, plavix   HTN -Hold antihypertensive in setting of blood loss anemia  HLD -Lipitor on hold while NPO    DVT prophylaxis: SCD Code Status: DNR, confirmed with patient, declines any aggressive measures including CPR, defib, mechanical ventilation  Family Communication: None Disposition Plan: Pending GI work up, back to SNF likely Consults called: GI called by EDP   Admission status: Observation    Severity of Illness: The appropriate patient status for this patient is OBSERVATION. Observation status is judged to be reasonable and necessary in order to provide  the required intensity of service to ensure the patient's safety. The patient's presenting symptoms, physical exam findings, and initial radiographic and laboratory data in the context of their medical condition is felt to place them at decreased risk for further clinical deterioration. Furthermore, it is anticipated that the patient will be medically stable for discharge from the hospital within 2 midnights of admission.     Noralee StainJennifer Kloe Oates, DO [facility] 04/04/2019, 5:35 PM    How to  contact the [facility] Attending or Consulting provider 7A - 7P or covering provider during after hours 7P -7A, for this patient?  1. Check the care team in [facility] and look for a) attending/consulting TR[facility] provider listed and b) the [facility] team listed 2. Log into [email or web address] and use [facility]'s universal password to access. If you do not have the password, please contact the hospital operator. 3. Locate the [facility] provider you are looking for under [facility] and page to a number that you can be directly reached. 4. If you still have difficulty reaching the provider, please page the [facility] (Director on Call) for the Hospitalists listed on amion for assistance.

## 2019-04-04 NOTE — Progress Notes (Signed)
Pt transferred to the unit via stretcher from ED. Pt has NS and blood transfusing currently. Pt states she has arthritis pain which is at her baseline. Pt is settled in the room with call bell and phone within reach. Pt has so s/s of distress.

## 2019-04-04 NOTE — ED Notes (Signed)
Date and time results received: 04/04/19 1420 (use smartphrase ".now" to insert current time)  Test: Hemoglobin Critical Value: 6.9  Name of Provider Notified: Dr Effie Shy   Orders Received? Or Actions Taken?:

## 2019-04-04 NOTE — ED Notes (Signed)
Bed: WA08 Expected date:  Expected time:  Means of arrival:  Comments: EMS 

## 2019-04-04 NOTE — ED Notes (Signed)
ED TO INPATIENT HANDOFF REPORT  Name/Age/Gender Connie Oconnor 78 y.o. female  Code Status Code Status History    Date Active Date Inactive Code Status Order ID Comments User Context   02/23/2019 2016 03/27/2019 1745 DNR [id number]  Bluford KaufmannLilja, Hilary H, RN Inpatient   02/23/2019 1706 02/23/2019 2016 Full Code [id number]  Charlton Amorngiulli, Daniel J, PA-C Inpatient   02/20/2019 1505 02/23/2019 1702 DNR [id number]  Jonah BlueYates, Jennifer, MD ED   03/29/2017 0158 03/29/2017 1516 Full Code [id number]  Costella, Darci CurrentVincent J, PA-C ED    Questions for Most Recent Historical Code Status (Order [id number])    Question Answer Comment   In the event of cardiac or respiratory ARREST Do not call a "code blue"    In the event of cardiac or respiratory ARREST Do not perform Intubation, CPR, defibrillation or ACLS    In the event of cardiac or respiratory ARREST Use medication by any route, position, wound care, and other measures to relive pain and suffering. May use oxygen, suction and manual treatment of airway obstruction as needed for comfort.    Comments discussed with patient on arrival to IP rehab. Code status was different in Rehab orders than prior orders. She wants to remain DNR. Purple bracelet in place, discussed with MD         Advance Directive Documentation     Most Recent Value  Type of Advance Directive  Living will  Pre-existing out of facility DNR order (yellow form or pink MOST form)  -  "MOST" Form in Place?  -      Home/SNF/Other Nursing Home  Chief Complaint low hemoglobin  Level of Care/Admitting Diagnosis ED Disposition    ED Disposition Condition Comment   Ad[facility]  [id number]  Level of Care: Med-Surg [16]  Covid Evaluation: Confirmed COVID Negative  Diagnosis: GI bleed [[id number]][[id number]]  Admitting Physician: Noralee StainCHOI, JENNIFER [[id number]]  Attending Physician: Noralee StainHOI, JENNIFER [phone][[id number]]  PT Class (Do Not Modify): Observation [104]  PT Acc Code (Do Not Modify):  Observation [10022]       Medical History Past Medical History:  Diagnosis Date  . Anemia    hx of  . Arthritis   . Dislocation of right knee with medial meniscus tear   . Headache    hx of migraines none since 1990's  . Hypothyroidism    1978, no problems since  . Pneumonia 1990's none since  . PVC (premature ventricular contraction)    199'0's none since  . SAH (subarachnoid hemorrhage) (HCC) 2016   On the right  . Stroke [facility](HCC)     Allergies Allergies  Allergen Reactions  . Lactose Intolerance (Gi) Diarrhea    Ensure causing diarrhea.  . Aspirin Other (See Comments)    shaking  . Ibuprofen Other (See Comments)    Shaking  . Penicillins Rash and Other (See Comments)    Has patient had a PCN reaction causing immediate rash, facial/tongue/throat swelling, SOB or lightheadedness with hypotension: No Has patient had a PCN reaction causing severe rash involving mucus membranes or skin necrosis: No Has patient had a PCN reaction that required hospitalization: Yes - was in hospital Has patient had a PCN reaction occurring within the last 10 years: No If all of the above answers are "NO", then may proceed with Cephalosporin use.     IV Location/Drains/Wounds Patient Lines/Drains/Airways Status   Active Line/Drains/Airways    Name:   Placement date:   Placement time:   Site:   Days:  Peripheral IV 04/04/19 Right Other (Comment)   04/04/19    1255    Other (Comment)   less than 1   Peripheral IV 04/04/19 Right Forearm   04/04/19    1300    Forearm   less than 1          Labs/Imaging Results for orders placed or performed during the hospital encounter of 04/04/19 (from the past 48 hour(s))  Type and screen     Status: None (Preliminary result)   Collection Time: 04/04/19  1:18 PM  Result Value Ref Range   ABO/RH(D) A POS    Antibody Screen NEG    Sample Expiration 04/07/2019,2359    Unit Number [id number]    Blood Component Type RED CELLS,LR    Unit division  00    Status of Unit ISSUED    Transfusion Status OK TO TRANSFUSE    Crossmatch Result      Compatible Performed at [facility], [address] [place], [place] [address]   ABO/Rh     Status: None   Collection Time: 04/04/19  1:18 PM  Result Value Ref Range   ABO/RH(D)      A POS Performed at [facility], [address] [place], [place] [address]   Basic metabolic panel     Status: Abnormal   Collection Time: 04/04/19  1:20 PM  Result Value Ref Range   Sodium 133 (L) 135 - 145 mmol/L   Potassium 3.7 3.5 - 5.1 mmol/L   Chloride 98 98 - 111 mmol/L   CO2 25 22 - 32 mmol/L   Glucose, Bld 98 70 - 99 mg/dL   BUN 11 8 - 23 mg/dL   Creatinine, Ser 9.47 0.44 - 1.00 mg/dL   Calcium 8.0 (L) 8.9 - 10.3 mg/dL   GFR calc non Af Amer >60 >60 mL/min   GFR calc Af Amer >60 >60 mL/min   Anion gap 10 5 - 15    Comment: Performed at [facility], [address] [place], [place] [address]  CBC with Differential     Status: Abnormal   Collection Time: 04/04/19  1:20 PM  Result Value Ref Range   WBC 9.1 4.0 - 10.5 K/uL   RBC 2.72 (L) 3.87 - 5.11 MIL/uL   Hemoglobin 6.9 (LL) 12.0 - 15.0 g/dL    Comment: REPEATED TO VERIFY THIS CRITICAL RESULT HAS VERIFIED AND BEEN CALLED TO C.FRANKLIN BY NATHAN THOMPSON ON 06 03 2020 AT 1417, AND HAS BEEN READ BACK. CRITICAL RESULT VERIFIED    HCT 22.5 (L) 36.0 - 46.0 %   MCV 82.7 80.0 - 100.0 fL   MCH 25.4 (L) 26.0 - 34.0 pg   MCHC 30.7 30.0 - 36.0 g/dL   RDW 36.6 29.4 - 76.5 %   Platelets 665 (H) 150 - 400 K/uL   nRBC 0.0 0.0 - 0.2 %   Neutrophils Relative % 80 %   Neutro Abs 7.4 1.7 - 7.7 K/uL   Lymphocytes Relative 10 %   Lymphs Abs 0.9 0.7 - 4.0 K/uL   Monocytes Relative 8 %   Monocytes Absolute 0.7 0.1 - 1.0 K/uL   Eosinophils Relative 1 %   Eosinophils Absolute 0.1 0.0 - 0.5 K/uL   Basophils Relative 0 %   Basophils Absolute 0.0 0.0 - 0.1 K/uL   Immature Granulocytes 1 %   Abs  Immature Granulocytes 0.05 0.00 - 0.07 K/uL    Comment: Performed at [employer] [facility], [address], [place], [place] [address]  POC occult blood, ED Provider will collect     Status: Abnormal   Collection Time: 04/04/19  2:08 PM  Result Value Ref Range   Fecal Occult Bld POSITIVE (A) NEGATIVE  Prepare RBC     Status: None   Collection Time: 04/04/19  2:27 PM  Result Value Ref Range   Order Confirmation      ORDER PROCESSED BY BLOOD BANK Performed at [facility], [address] [place], [place] [address]   SARS Coronavirus 2 (CEPHEID - Performed in [facility] hospital lab), Hosp Order     Status: None   Collection Time: 04/04/19  2:28 PM  Result Value Ref Range   SARS Coronavirus 2 NEGATIVE NEGATIVE    Comment: (NOTE) If result is NEGATIVE SARS-CoV-2 target nucleic acids are NOT DETECTED. The SARS-CoV-2 RNA is generally detectable in upper and lower  respiratory specimens during the acute phase of infection. The lowest  concentration of SARS-CoV-2 viral copies this assay can detect is 250  copies / mL. A negative result does not preclude SARS-CoV-2 infection  and should not be used as the sole basis for treatment or other  patient management decisions.  A negative result may occur with  improper specimen collection / handling, submission of specimen other  than nasopharyngeal swab, presence of viral mutation(s) within the  areas targeted by this assay, and inadequate number of viral copies  (<250 copies / mL). A negative result must be combined with clinical  observations, patient history, and epidemiological information. If result is POSITIVE SARS-CoV-2 target nucleic acids are DETECTED. The SARS-CoV-2 RNA is generally detectable in upper and lower  respiratory specimens dur ing the acute phase of infection.  Positive  results are indicative of active infection with SARS-CoV-2.  Clinical  correlation with patient history and other  diagnostic information is  necessary to determine patient infection status.  Positive results do  not rule out bacterial infection or co-infection with other viruses. If result is PRESUMPTIVE POSTIVE SARS-CoV-2 nucleic acids MAY BE PRESENT.   A presumptive positive result was obtained on the submitted specimen  and confirmed on repeat testing.  While 2019 novel coronavirus  (SARS-CoV-2) nucleic acids may be present in the submitted sample  additional confirmatory testing may be necessary for epidemiological  and / or clinical management purposes  to differentiate between  SARS-CoV-2 and other Sarbecovirus currently known to infect humans.  If clinically indicated additional testing with an alternate test  methodology [phone]) is advised. The SARS-CoV-2 RNA is generally  detectable in upper and lower respiratory sp ecimens during the acute  phase of infection. The expected result is Negative. Fact Sheet for Patients:  BoilerBrush.com.cy Fact Sheet for Healthcare Providers: https://pope.com/ This test is not yet approved or cleared by the [place] FDA and has been authorized for detection and/or diagnosis of SARS-CoV-2 by FDA under an Emergency Use Authorization (EUA).  This EUA will remain in effect (meaning this test can be used) for the duration of the COVID-19 declaration under Section 564(b)(1) of the Act, 21 U.S.C. section 360bbb-3(b)(1), unless the authorization is terminated or revoked sooner. Performed at [facility], [address], [place], [place] [address]    No results found.  Pending Labs [employer] (From admission, onward)    Start     Ordered   Signed and Held  CBC  Tomorrow morning,   R     Signed and  Held   Signed and Held  Basic metabolic panel  Tomorrow morning,   R     Signed and Held   Signed and Held  Hemoglobin and hematocrit, blood  Now then every 6 hours,   R     Signed and  Held          Vitals/Pain Today's Vitals   04/04/19 1613 04/04/19 1700 04/04/19 1730 04/04/19 1800  BP: (!) 130/42 107/78 (!) 123/44 (!) 122/47  Pulse: 77 77 77   Resp: 18 (!) 22 20 (!) 21  Temp: 98.8 F (37.1 C)     TempSrc: Oral     SpO2: 98% 98% 98%   Weight:      Height:      PainSc:        Isolation Precautions No active isolations  Medications Medications  0.9 %  sodium chloride infusion ( Intravenous New Bag/Given 04/04/19 1330)  0.9 %  sodium chloride infusion (has no administration in time range)  pantoprazole (PROTONIX) injection 40 mg (40 mg Intravenous Given 04/04/19 1435)    Mobility walks with device

## 2019-04-04 NOTE — ED Provider Notes (Signed)
[facility]-EMERGENCY DEPT Provider Note   CSN: [id number] Arrival date & time: 04/04/19  1237    History   Chief Complaint Chief Complaint  Patient presents with  . Abnormal Lab    HPI Connie Oconnor is a 78 y.o. female.     HPI   She presents for evaluation of low hemoglobin.  She is also noted tarry stools for the last month.  She states her appetite has been poor for at least a year.  She is in a nursing care facility, for the last 2 weeks following a recent stroke.  She denies vomiting, fever, chills, shortness of breath, focal weakness or paresthesias.  She thinks she has had a blood transfusion previously, but does not think she has had GI bleeding.  There are no other known modifying factors.  Past Medical History:  Diagnosis Date  . Anemia    hx of  . Arthritis   . Dislocation of right knee with medial meniscus tear   . Headache    hx of migraines none since 1990's  . Hypothyroidism    1978, no problems since  . Pneumonia 1990's none since  . PVC (premature ventricular contraction)    199'0's none since  . SAH (subarachnoid hemorrhage) (HCC) 2016   On the right  . Stroke [name])     Patient Active Problem List   Diagnosis Date Noted  . Vascular dementia without behavioral disturbance (HCC)   . 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 Gastric erosion   . Gastrointestinal hemorrhage   . Acute respiratory failure with hypoxia (HCC)   . Hyponatremia   . Tobacco abuse   . Pulmonary emphysema (HCC)   . Pulmonary nodules   . Primary osteoarthritis of right hip   . Subacute confusional state   . Lethargy   . Confusion, postoperative   . Acute lower UTI   . Leukocytosis   . Slow transit constipation   . Pain of left calf   . Impulsiveness   . Dysphagia, post-stroke   . Labile blood pressure   . Hyperglycemia   . Hypotension   . Hypoalbuminemia due to protein-calorie malnutrition (HCC)   . Acute blood loss  anemia   . Hypoglycemia   . Essential hypertension   . History of traumatic brain injury   . Lacunar infarct, acute (HCC) 02/23/2019  . 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 Hypothyroidism (acquired) 02/20/2019  . Closed fracture of medial portion of right tibial plateau 01/11/2018  . Acute medial meniscal tear, right, initial encounter 01/11/2018  . SAH (subarachnoid hemorrhage) (HCC) 03/29/2017    Past Surgical History:  Procedure Laterality Date  . BACK SURGERY    . CERVICAL LAMINECTOMY  1986  . DILATION AND CURETTAGE OF UTERUS  1970  . ELBOW SURGERY Right 1993  . ESOPHAGOGASTRODUODENOSCOPY N/A 03/20/2019   Procedure: ESOPHAGOGASTRODUODENOSCOPY (EGD);  Surgeon: Tressia Danas, MD;  Location: [facility] ENDOSCOPY;  Service: Gastroenterology;  Laterality: N/A;  . IR ANGIO INTRA EXTRACRAN SEL COM CAROTID INNOMINATE BILAT MOD SED  02/22/2019  . IR ANGIO VERTEBRAL SEL SUBCLAVIAN INNOMINATE UNI L MOD SED  02/22/2019  . IR ANGIO VERTEBRAL SEL VERTEBRAL UNI R MOD SED  02/22/2019  . KNEE ARTHROSCOPY WITH SUBCHONDROPLASTY Right 01/11/2018   Procedure: KNEE ARTHROSCOPY WITH SUBCHONDROPLASTY, PARTIAL MEDIAL MENISCECTOMY, CHONDROPLASTY OF THE MEDIAL PATELLA FEMORAL CHONDYL, MEDIAL PLICA  EXCISION;  Surgeon: Jodi Geralds, MD;  Location: [facility]  ORS;  Service: Orthopedics;  Laterality: Right;  . LUMBAR LAMINECTOMY  1993   rods placed bone generated removed one year later   . MULTIPLE TOOTH EXTRACTIONS    . SHOULDER SURGERY Left    arthritis clean out and shortened collar bone dr Renae Fickle  . TONSILLECTOMY  1970  . VAGINAL HYSTERECTOMY  1971   partial dr Dewaine Conger  . WISDOM TOOTH EXTRACTION       OB History   No obstetric history on file.      Home Medications    Prior to Admission medications   Medication Sig Start Date End Date Taking? Authorizing Provider  acetaminophen (TYLENOL) 325 MG tablet Take 2 tablets (650 mg total) by mouth every 4  (four) hours as needed for mild pain (or temp > 37.5 C (99.5 F)). 03/27/19  Yes Angiulli, Mcarthur Rossetti, PA-C  atorvastatin (LIPITOR) 40 MG tablet Take 1 tablet (40 mg total) by mouth daily at 6 PM. 02/23/19  Yes Azucena Fallen, MD  clopidogrel (PLAVIX) 75 MG tablet Take 1 tablet (75 mg total) by mouth daily. 02/24/19  Yes Azucena Fallen, MD  diclofenac sodium (VOLTAREN) 1 % GEL Apply 2 g topically 4 (four) times daily. 03/27/19  Yes Angiulli, Mcarthur Rossetti, PA-C  docusate sodium (COLACE) 100 MG capsule Take 100 mg by mouth daily.   Yes [provider]  gabapentin (NEURONTIN) 100 MG capsule Take 100 mg by mouth 3 (three) times daily.   Yes [provider]  iron polysaccharides (NIFEREX) 150 MG capsule Take 1 capsule (150 mg total) by mouth daily. 03/27/19  Yes Angiulli, Mcarthur Rossetti, PA-C  losartan (COZAAR) 25 MG tablet Take 1 tablet (25 mg total) by mouth daily. 03/27/19  Yes Angiulli, Mcarthur Rossetti, PA-C  magnesium oxide (MAG-OX) 400 MG tablet Take 400 mg by mouth daily.   Yes [provider]  Melatonin 3 MG TABS Take 6 mg by mouth at bedtime.   Yes [provider]  Multiple Vitamin (MULTIVITAMIN WITH MINERALS) TABS tablet Take 1 tablet by mouth daily. 03/27/19  Yes Angiulli, Mcarthur Rossetti, PA-C  nicotine (NICODERM CQ - DOSED IN MG/24 HOURS) 14 mg/24hr patch Place 1 patch (14 mg total) onto the skin daily. 03/27/19  Yes Angiulli, Mcarthur Rossetti, PA-C  pantoprazole (PROTONIX) 40 MG tablet Take 1 tablet (40 mg total) by mouth 2 (two) times daily before a meal. 03/27/19  Yes Angiulli, Mcarthur Rossetti, PA-C  polyethylene glycol (MIRALAX / GLYCOLAX) 17 g packet Take 17 g by mouth 2 (two) times daily. 03/27/19  Yes Angiulli, Mcarthur Rossetti, PA-C  sucralfate (CARAFATE) 1 g tablet Take 1 g by mouth 4 (four) times daily. 04/04/19 04/18/19 Yes [provider]  aspirin 81 MG EC tablet Take 1 tablet (81 mg total) by mouth daily for 21 days. Patient not taking: Reported on 04/04/2019 03/27/19 04/17/19  Angiulli,  Mcarthur Rossetti, PA-C  senna-docusate (SENOKOT-S) 8.6-50 MG tablet Take 2 tablets by mouth at bedtime. Patient not taking: Reported on 04/04/2019 03/27/19   Angiulli, Mcarthur Rossetti, PA-C    Family History Family History  Problem Relation Age of Onset  . Stroke Mother   . Heart attack Father     Social History Social History   Tobacco Use  . Smoking status: Current Every Day Smoker    Packs/day: 1.00    Years: 72.00    Pack years: 72.00    Types: Cigarettes  . Smokeless tobacco: Never Used  Substance Use Topics  . Alcohol use: No  .  Drug use: No     Allergies   Lactose intolerance (gi); Aspirin; Ibuprofen; and Penicillins   Review of Systems Review of Systems  All other systems reviewed and are negative.    Physical Exam Updated Vital Signs BP (!) 130/42   Pulse 77   Temp 98.8 F (37.1 C) (Oral)   Resp 18   Ht 5\' 5"  (1.651 m)   Wt 78.5 kg   SpO2 98%   BMI 28.80 kg/m   Physical Exam Vitals signs and nursing note reviewed.  Constitutional:      General: She is not in acute distress.    Appearance: Normal appearance. She is well-developed and normal weight. She is not ill-appearing, toxic-appearing or diaphoretic.  HENT:     Head: Normocephalic and atraumatic.     Right Ear: External ear normal.     Left Ear: External ear normal.     Nose: No congestion or rhinorrhea.     Mouth/Throat:     Pharynx: No oropharyngeal exudate or posterior oropharyngeal erythema.  Eyes:     Conjunctiva/sclera: Conjunctivae normal.     Pupils: Pupils are equal, round, and reactive to light.  Neck:     Musculoskeletal: Normal range of motion and neck supple.     Trachea: Phonation normal.  Cardiovascular:     Rate and Rhythm: Normal rate and regular rhythm.     Heart sounds: Normal heart sounds.  Pulmonary:     Effort: Pulmonary effort is normal. No respiratory distress.     Breath sounds: Normal breath sounds. No stridor. No rhonchi.  Abdominal:     General: There is no distension.      Palpations: Abdomen is soft.     Tenderness: There is no abdominal tenderness.  Genitourinary:    Comments: Normal anus, thin black stool in the rectum. Musculoskeletal: Normal range of motion.  Skin:    General: Skin is warm and dry.  Neurological:     Mental Status: She is alert and oriented to person, place, and time.     Cranial Nerves: No cranial nerve deficit.     Sensory: No sensory deficit.     Motor: No abnormal muscle tone.     Coordination: Coordination normal.  Psychiatric:        Behavior: Behavior normal.        Thought Content: Thought content normal.        Judgment: Judgment normal.      ED Treatments / Results  Labs (all labs ordered are listed, but only abnormal results are displayed) Labs Reviewed  BASIC METABOLIC PANEL - Abnormal; Notable for the following components:      Result Value   Sodium 133 (*)    Calcium 8.0 (*)    All other components within normal limits  CBC WITH DIFFERENTIAL/PLATELET - Abnormal; Notable for the following components:   RBC 2.72 (*)    Hemoglobin 6.9 (*)    HCT 22.5 (*)    MCH 25.4 (*)    Platelets 665 (*)    All other components within normal limits  POC OCCULT BLOOD, ED - Abnormal; Notable for the following components:   Fecal Occult Bld POSITIVE (*)    All other components within normal limits  SARS CORONAVIRUS 2 (HOSPITAL ORDER, PERFORMED IN [facility] LAB)  TYPE AND SCREEN  PREPARE RBC (CROSSMATCH)  ABO/RH    EKG None  Radiology No results found.  Procedures .Critical Care Performed by: Mancel BaleWentz, Anju Sereno, MD Authorized by: Effie ShyWentz,  Mechele Collin, MD   Critical care provider statement:    Critical care time (minutes):  45   Critical care start time:  04/04/2019 1:15 PM   Critical care end time:  04/04/2019 5:21 PM   Critical care time was exclusive of:  Separately billable procedures and treating other patients   Critical care was time spent personally by me on the following activities:  Blood draw for  specimens, development of treatment plan with patient or surrogate, discussions with consultants, evaluation of patient's response to treatment, examination of patient, obtaining history from patient or surrogate, ordering and performing treatments and interventions, ordering and review of laboratory studies, pulse oximetry, re-evaluation of patient's condition, review of old charts and ordering and review of radiographic studies   (including critical care time)  Medications Ordered in ED Medications  0.9 %  sodium chloride infusion ( Intravenous New Bag/Given 04/04/19 1330)  0.9 %  sodium chloride infusion (has no administration in time range)  pantoprazole (PROTONIX) injection 40 mg (40 mg Intravenous Given 04/04/19 1435)     Initial Impression / Assessment and Plan / ED Course  I have reviewed the triage vital signs and the nursing notes.  Pertinent labs & imaging results that were available during my care of the patient were reviewed by me and considered in my medical decision making (see chart for details).  Clinical Course as of Apr 03 1720  Wed Apr 04, 2019  1428 Abnormal, hemoglobin 6.9, hematocrit low, platelets high  CBC with Differential(!!) [EW]  1428 Abnormal, blood present  POC occult blood, ED Provider will collect(!) [EW]  1428 Abnormal, sodium low, calcium low  Basic metabolic panel(!) [EW]  1428 Initiated blood transfusion for symptomatic anemia.  Screening for Covid-19, I suspect that she will require hospitalization.   [EW]  1716 I was able to reach patient's daughter Arline Asp, who is listed as her contact.  We discussed the findings and plan, I answered all of her questions.   [EW]  1716 I discussed the case with Dr. Yancey Flemings, gastroenterology, he will have his team follow-up and see the patient, tomorrow morning.   [EW]    Clinical Course User Index [EW] Mancel Bale, MD        Patient Vitals for the past 24 hrs:  BP Temp Temp src Pulse Resp SpO2 Height  Weight  04/04/19 1613 (!) 130/42 98.8 F (37.1 C) Oral 77 18 98 % - -  04/04/19 1600 (!) 120/58 - - 76 (!) 21 98 % - -  04/04/19 1549 (!) 123/38 98.6 F (37 C) Oral 77 18 100 % - -  04/04/19 1530 (!) 123/38 - - 75 17 97 % - -  04/04/19 1430 (!) 114/47 - - 72 16 100 % - -  04/04/19 1400 (!) 117/44 - - 70 18 99 % - -  04/04/19 1300 (!) 124/45 - - 70 18 98 % - -  04/04/19 1250 (!) 125/55 98.5 F (36.9 C) Oral 72 18 98 % - -  04/04/19 1249 - - - - - -  (1.651 m) 78.5 kg    4:51 PM Reevaluation with update and discussion. After initial assessment and treatment, an updated evaluation reveals no change in clinical status, findings discussed and questions answered. Mancel Bale   Medical Decision Making: Chronic anemia with sudden decrease and new onset rectal bleeding.  BUN normal, stool black in color.  Likely small intestine bleed.  She is currently on Plavix, post recent CVA.  Patient  potentially with ongoing bleeding requiring hospitalization for observation and serial hemoglobin testing.   Odalis Pruess was evaluated in Emergency Department on 04/04/2019 for the symptoms described in the history of present illness. She was evaluated in the context of the global COVID-19 pandemic, which necessitated consideration that the patient might be at risk for infection with the SARS-CoV-2 virus that causes COVID-19. Institutional protocols and algorithms that pertain to the evaluation of patients at risk for COVID-19 are in a state of rapid change based on information released by regulatory bodies including the CDC and federal and state organizations. These policies and algorithms were followed during the patient's care in the ED.   CRITICAL CARE-Yes Performed by: Mancel Bale  Nursing Notes Reviewed/ Care Coordinated Applicable Imaging Reviewed Interpretation of Laboratory Data incorporated into ED treatment  4:52 PM-Consult complete with hospitalist. Patient case explained and discussed.   She agrees to admit patient for further evaluation and treatment. Call ended at 5:06 PM  Plan: Admit  Final Clinical Impressions(s) / ED Diagnoses   Final diagnoses:  Symptomatic anemia  Rectal bleeding    ED Discharge Orders    None       Mancel Bale, MD 04/04/19 1721

## 2019-04-05 DIAGNOSIS — D62 Acute posthemorrhagic anemia: Secondary | ICD-10-CM | POA: Diagnosis not present

## 2019-04-05 DIAGNOSIS — E785 Hyperlipidemia, unspecified: Secondary | ICD-10-CM | POA: Diagnosis present

## 2019-04-05 DIAGNOSIS — Z888 Allergy status to other drugs, medicaments and biological substances status: Secondary | ICD-10-CM | POA: Diagnosis not present

## 2019-04-05 DIAGNOSIS — K625 Hemorrhage of anus and rectum: Secondary | ICD-10-CM | POA: Diagnosis present

## 2019-04-05 DIAGNOSIS — Z87891 Personal history of nicotine dependence: Secondary | ICD-10-CM | POA: Diagnosis not present

## 2019-04-05 DIAGNOSIS — G8929 Other chronic pain: Secondary | ICD-10-CM | POA: Diagnosis present

## 2019-04-05 DIAGNOSIS — K922 Gastrointestinal hemorrhage, unspecified: Secondary | ICD-10-CM | POA: Diagnosis present

## 2019-04-05 DIAGNOSIS — Z9181 History of falling: Secondary | ICD-10-CM | POA: Diagnosis not present

## 2019-04-05 DIAGNOSIS — J439 Emphysema, unspecified: Secondary | ICD-10-CM | POA: Diagnosis present

## 2019-04-05 DIAGNOSIS — Z8249 Family history of ischemic heart disease and other diseases of the circulatory system: Secondary | ICD-10-CM | POA: Diagnosis not present

## 2019-04-05 DIAGNOSIS — I69354 Hemiplegia and hemiparesis following cerebral infarction affecting left non-dominant side: Secondary | ICD-10-CM | POA: Diagnosis not present

## 2019-04-05 DIAGNOSIS — E876 Hypokalemia: Secondary | ICD-10-CM | POA: Diagnosis present

## 2019-04-05 DIAGNOSIS — Z823 Family history of stroke: Secondary | ICD-10-CM | POA: Diagnosis not present

## 2019-04-05 DIAGNOSIS — K210 Gastro-esophageal reflux disease with esophagitis: Secondary | ICD-10-CM | POA: Diagnosis present

## 2019-04-05 DIAGNOSIS — Z7902 Long term (current) use of antithrombotics/antiplatelets: Secondary | ICD-10-CM | POA: Diagnosis not present

## 2019-04-05 DIAGNOSIS — K319 Disease of stomach and duodenum, unspecified: Secondary | ICD-10-CM | POA: Diagnosis present

## 2019-04-05 DIAGNOSIS — K449 Diaphragmatic hernia without obstruction or gangrene: Secondary | ICD-10-CM | POA: Diagnosis present

## 2019-04-05 DIAGNOSIS — I10 Essential (primary) hypertension: Secondary | ICD-10-CM | POA: Diagnosis present

## 2019-04-05 DIAGNOSIS — Z880 Allergy status to penicillin: Secondary | ICD-10-CM | POA: Diagnosis not present

## 2019-04-05 DIAGNOSIS — Z8711 Personal history of peptic ulcer disease: Secondary | ICD-10-CM | POA: Diagnosis not present

## 2019-04-05 DIAGNOSIS — Z90710 Acquired absence of both cervix and uterus: Secondary | ICD-10-CM | POA: Diagnosis not present

## 2019-04-05 DIAGNOSIS — M1990 Unspecified osteoarthritis, unspecified site: Secondary | ICD-10-CM | POA: Diagnosis present

## 2019-04-05 DIAGNOSIS — Z66 Do not resuscitate: Secondary | ICD-10-CM | POA: Diagnosis present

## 2019-04-05 DIAGNOSIS — Z20828 Contact with and (suspected) exposure to other viral communicable diseases: Secondary | ICD-10-CM | POA: Diagnosis present

## 2019-04-05 DIAGNOSIS — E739 Lactose intolerance, unspecified: Secondary | ICD-10-CM | POA: Diagnosis present

## 2019-04-05 LAB — BPAM RBC
Blood Product Expiration Date: 202006182359
ISSUE DATE / TIME: 202006031538
Unit Type and Rh: 6200

## 2019-04-05 LAB — CBC
HCT: 26.4 % — ABNORMAL LOW (ref 36.0–46.0)
Hemoglobin: 8.2 g/dL — ABNORMAL LOW (ref 12.0–15.0)
MCH: 26.4 pg (ref 26.0–34.0)
MCHC: 31.1 g/dL (ref 30.0–36.0)
MCV: 84.9 fL (ref 80.0–100.0)
Platelets: 699 10*3/uL — ABNORMAL HIGH (ref 150–400)
RBC: 3.11 MIL/uL — ABNORMAL LOW (ref 3.87–5.11)
RDW: 15.1 % (ref 11.5–15.5)
WBC: 10.3 10*3/uL (ref 4.0–10.5)
nRBC: 0 % (ref 0.0–0.2)

## 2019-04-05 LAB — TYPE AND SCREEN
ABO/RH(D): A POS
Antibody Screen: NEGATIVE
Unit division: 0

## 2019-04-05 LAB — HEMOGLOBIN AND HEMATOCRIT, BLOOD
HCT: 28.6 % — ABNORMAL LOW (ref 36.0–46.0)
HCT: 27.2 % — ABNORMAL LOW (ref 36.0–46.0)
Hemoglobin: 8.6 g/dL — ABNORMAL LOW (ref 12.0–15.0)
Hemoglobin: 8.4 g/dL — ABNORMAL LOW (ref 12.0–15.0)

## 2019-04-05 LAB — BASIC METABOLIC PANEL
Anion gap: 9 (ref 5–15)
BUN: 11 mg/dL (ref 8–23)
CO2: 22 mmol/L (ref 22–32)
Calcium: 7.7 mg/dL — ABNORMAL LOW (ref 8.9–10.3)
Chloride: 103 mmol/L (ref 98–111)
Creatinine, Ser: 0.52 mg/dL (ref 0.44–1.00)
GFR calc Af Amer: >60 mL/min (ref >60)
GFR calc non Af Amer: >60 mL/min (ref >60)
Glucose, Bld: 94 mg/dL (ref 70–99)
Potassium: 3.9 mmol/L (ref 3.5–5.1)
Sodium: 134 mmol/L — ABNORMAL LOW (ref 135–145)

## 2019-04-05 MED ORDER — LIDOCAINE 5 % EX PTCH
1.0000 | MEDICATED_PATCH | CUTANEOUS | Status: DC
  Administered 2019-04-05 – 2019-04-09 (×5): 1 via TRANSDERMAL
  Filled 2019-04-05 (×5): qty 1

## 2019-04-05 NOTE — Progress Notes (Signed)
PROGRESS NOTE    Connie Oconnor  TMH:[id number] DOB: 09/22/1941 DOA: 04/04/2019 PCP: Heide Scales, PA-C     Brief Narrative:  Connie Oconnor is a 78 y.o. female with medical history significant of right SAH from a fall in May 2018, hypothyroidism, tobacco abuse, back surgery. She was hospitalized in April 2020 due to acute left-sided weakness, MRI revealed thrombotic stroke. Patient remained on aspirin, plavix and sent to rehab. During that admission, she also had acute blood loss anemia with positive hemoccult. She underwent EGD showing nonbleeding gastric ulcer erosive gastropathy recommending PPI twice daily x8 weeks then daily recommended Carafate 1 g 4 times daily for 2 weeks.   EGD 03/20/2019 Dr. Orvan Falconer revealed: - LA Grade B reflux esophagitis. - Small hiatal hernia without Cameron's erosions. - Non-bleeding gastric ulcer with no stigmata of bleeding. - Non-bleeding erosive gastropathy. - Normal examined duodenum. - The examination was otherwise normal. - No specimens collected.  Colonoscopy 2009: normal exam at that time  Her main complaint is weakness and fatigue.  She denies any dizziness or lightheadedness, no chest pain, nausea, vomiting or abdominal pain.  She also admits to some mild shortness of breath without any cough.  No fevers.  New events last 24 hours / Subjective: Hgb stable overnight. Patient complaining of being thirsty but no other complaints.   Assessment & Plan:   Principal Problem:   Acute blood loss anemia Active Problems:   Essential hypertension   GI bleed   HLD (hyperlipidemia)   Acute blood loss anemia -Previous EGD revealed esophagitis, nonbleeding gastric ulcer, nonbleeding erosive gastropathy -PPI IV  -Carafate once able to take PO  -GI consulted  -Trend H&H, stable this morning Hgb 8.2   Recent hx right lacunar infarct -Hold aspirin, plavix   HTN -Hold antihypertensive in setting of blood loss anemia  HLD -Lipitor on  hold while NPO    DVT prophylaxis: SCD Code Status: DNR Family Communication: Spoke with daughter over the phone  Disposition Plan: Pending GI consult   Consultants:   GI   Procedures:   None   Antimicrobials:  Anti-infectives (From admission, onward)   None        Objective: Vitals:   04/04/19 1851 04/04/19 2015 04/04/19 2103 04/05/19 0533  BP: (!) 132/50 (!) 136/56 (!) 142/54 92/78  Pulse: 80 78 83 79  Resp: 16 16 18 18   Temp: 99.6 F (37.6 C) 99.2 F (37.3 C) 99.4 F (37.4 C) 98.3 F (36.8 C)  TempSrc: Oral Oral Oral Oral  SpO2: 98% 98% 97% 94%  Weight:      Height:        Intake/Output Summary (Last 24 hours) at 04/05/2019 1157 Last data filed at 04/05/2019 0900 Gross per 24 hour  Intake 1212.76 ml  Output -  Net 1212.76 ml   Filed Weights   04/04/19 1249  Weight: 78.5 kg    Examination:  General exam: Appears calm and comfortable  Respiratory system: Clear to auscultation. Respiratory effort normal. Cardiovascular system: S1 & S2 heard, RRR. No JVD, murmurs, rubs, gallops or clicks. No pedal edema. Gastrointestinal system: Abdomen is nondistended, soft and nontender. No organomegaly or masses felt. Normal bowel sounds heard. Central nervous system: Alert and oriented. No focal neurological deficits. Extremities: Symmetric 5 x 5 power. Skin: No rashes, lesions or ulcers Psychiatry: Judgement and insight appear normal. Mood & affect appropriate.   Data Reviewed: I have personally reviewed following labs and imaging studies  CBC: Recent Labs  Lab 04/04/19  1320 04/05/19 0102  WBC 9.1 10.3  NEUTROABS 7.4  --   HGB 6.9* 8.2*  HCT 22.5* 26.4*  MCV 82.7 84.9  PLT 665* 699*   Basic Metabolic Panel: Recent Labs  Lab 04/04/19 1320 04/05/19 0102  NA 133* 134*  K 3.7 3.9  CL 98 103  CO2 25 22  GLUCOSE 98 94  BUN 11 11  CREATININE 0.49 0.52  CALCIUM 8.0* 7.7*   GFR: Estimated Creatinine Clearance: 61 mL/min (by C-G formula based on SCr  of 0.52 mg/dL). Liver Function Tests: No results for input(s): AST, ALT, ALKPHOS, BILITOT, PROT, ALBUMIN in the last 168 hours. No results for input(s): LIPASE, AMYLASE in the last 168 hours. No results for input(s): AMMONIA in the last 168 hours. Coagulation Profile: No results for input(s): INR, PROTIME in the last 168 hours. Cardiac Enzymes: No results for input(s): CKTOTAL, CKMB, CKMBINDEX, TROPONINI in the last 168 hours. BNP (last 3 results) No results for input(s): PROBNP in the last 8760 hours. HbA1C: No results for input(s): HGBA1C in the last 72 hours. CBG: No results for input(s): GLUCAP in the last 168 hours. Lipid Profile: No results for input(s): CHOL, HDL, LDLCALC, TRIG, CHOLHDL, LDLDIRECT in the last 72 hours. Thyroid Function Tests: No results for input(s): TSH, T4TOTAL, FREET4, T3FREE, THYROIDAB in the last 72 hours. Anemia Panel: No results for input(s): VITAMINB12, FOLATE, FERRITIN, TIBC, IRON, RETICCTPCT in the last 72 hours. Sepsis Labs: No results for input(s): PROCALCITON, LATICACIDVEN in the last 168 hours.  Recent Results (from the past 240 hour(s))  SARS Coronavirus 2 (CEPHEID - Performed in [facility] hospital lab), Hosp Order     Status: None   Collection Time: 04/04/19  2:28 PM  Result Value Ref Range Status   SARS Coronavirus 2 NEGATIVE NEGATIVE Final    Comment: (NOTE) If result is NEGATIVE SARS-CoV-2 target nucleic acids are NOT DETECTED. The SARS-CoV-2 RNA is generally detectable in upper and lower  respiratory specimens during the acute phase of infection. The lowest  concentration of SARS-CoV-2 viral copies this assay can detect is 250  copies / mL. A negative result does not preclude SARS-CoV-2 infection  and should not be used as the sole basis for treatment or other  patient management decisions.  A negative result may occur with  improper specimen collection / handling, submission of specimen other  than nasopharyngeal swab, presence of  viral mutation(s) within the  areas targeted by this assay, and inadequate number of viral copies  (<250 copies / mL). A negative result must be combined with clinical  observations, patient history, and epidemiological information. If result is POSITIVE SARS-CoV-2 target nucleic acids are DETECTED. The SARS-CoV-2 RNA is generally detectable in upper and lower  respiratory specimens dur ing the acute phase of infection.  Positive  results are indicative of active infection with SARS-CoV-2.  Clinical  correlation with patient history and other diagnostic information is  necessary to determine patient infection status.  Positive results do  not rule out bacterial infection or co-infection with other viruses. If result is PRESUMPTIVE POSTIVE SARS-CoV-2 nucleic acids MAY BE PRESENT.   A presumptive positive result was obtained on the submitted specimen  and confirmed on repeat testing.  While 2019 novel coronavirus  (SARS-CoV-2) nucleic acids may be present in the submitted sample  additional confirmatory testing may be necessary for epidemiological  and / or clinical management purposes  to differentiate between  SARS-CoV-2 and other Sarbecovirus currently known to infect humans.  If  clinically indicated additional testing with an alternate test  methodology [phone]([id number]) is advised. The SARS-CoV-2 RNA is generally  detectable in upper and lower respiratory sp ecimens during the acute  phase of infection. The expected result is Negative. Fact Sheet for Patients:  BoilerBrush.com.cyhttps://www.fda.gov/media/136312/download Fact Sheet for Healthcare Providers: https://pope.com/https://www.fda.gov/media/136313/download This test is not yet approved or cleared by the [place] FDA and has been authorized for detection and/or diagnosis of SARS-CoV-2 by FDA under an Emergency Use Authorization (EUA).  This EUA will remain in effect (meaning this test can be used) for the duration of the COVID-19 declaration under Section  564(b)(1) of the Act, 21 U.S.C. section 360bbb-3(b)(1), unless the authorization is terminated or revoked sooner. Performed at [facility], [address] [place], [place] [address]        Radiology Studies: No results found.    Scheduled Meds: . lidocaine  1 patch Transdermal Q24H  . pantoprazole (PROTONIX) IV  40 mg Intravenous Q12H   Continuous Infusions: . sodium chloride 100 mL/hr at 04/05/19 1028     LOS: 0 days    Time spent: 25 minutes   Noralee StainJennifer Graci Hulce, DO [facility] [email or web address] 04/05/2019, 11:57 AM

## 2019-04-05 NOTE — Care Management Obs Status (Signed)
MEDICARE OBSERVATION STATUS NOTIFICATION   Patient Details  Name: Marijose Lemelle MRN: [id number] Date of Birth: 1941-06-30   Medicare Observation Status Notification Given:  Yes    Golda Acre, RN 04/05/2019, 10:32 AM

## 2019-04-05 NOTE — Consult Note (Addendum)
Referring Provider: [facility]          Primary Care Physician:  Heide Scales, PA-C Primary Gastroenterologist:   Previously Dr. Jarold Motto          Reason for Consultation:  FOBT+ anemia          ASSESSMENT / PLAN:     1. 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 She has had a 1.5 gram decline in hgb on plavix and asa (and despite oral iron replacement). Hgb 6.9, down from 8.4 just over a week ago. Erosive disease and non-bleeding gastric ulcer on recent EGD. Lesions low risk but may have bled some on plavix / asa. Hgb came up more than expected with just the one unit of blood ( 6.9 >>> 8.6 so perhaps she was truly as low as 6.9  -We may need to repeat EGD. The plan when we saw her mid May was for outpatient colonoscopy.    ADDENDUM: I had spoken earlier to daughter Arline Asp about consent for EGD.  She opted to discuss with her Sister Cthy and get back with me.  I just got off the phone with them. Daugters respectively decline EGD. They prefer a watch and wait approach for now unless there is evidence for overt bleeding. This seems reasonable though daughters understand that there is some risk as patient needs to resume plavix. I cancelled EGD.  Going forward would continue PPI, avoid NSAIDs.   I would not send the patient home on oral iron as discoloring the stool makes it hard to sort out a GI bleed.  Daughters will monitor closely for dark stools especially if they become more loose / frequent. Would also recommend when returns to Rehab that CBC be done within a week. Following that would check labs Q two weeks x 2.   2. Thrombocytosis, progressive. In mid May platelets were  in upper 500 range, now at 699. Etiology?   3. CVA late April. On Plavix and asa. Has been at Rehab since hospital discharge late May.                                                                                                         HPI:   Connie Oconnor is a 78 y.o. female who we  say in the hospital mid May for  Anemia and FOBT+ on asa and plavix (CVA in late April). Hgb was 14 in late April, it had declined into 7 range when we saw her in consult. History given to me by patient at the time did rase concern for episode of hematemesis at home prior to admission. Inpatient EGD 5/21 >> non-bleeding gastric ulcer and erosive disease, see below. Plan at discharge was for BID PPI, carafate and outpatient follow up to discuss colonoscopy after a plavix washout. During mid May admission she didn't require a blood transfusion, didn't receive IV iron but was discharged home on daily iron. Hgb at discharge was 8.4. Of note ferritin that admission was 357. TIBC low at 217, % sat 6.  [facility] undergoing rehab for CVA. Her hgb at the facility was 6.6. No overt bleeding. Main complaint was fatigue. ASA / plavix placed on hold. She got a uPRBC last evening with just over a one gram rise in hgb  Patient doesn't recall being in the hospital mid May. She doesn't recall having an EGD. She thinks he stools are dark on iron. She denies abdominal pain, N/V or obvious overt GI bleeding.    MOST RECENT GI STUDIES:   EGD 03/20/19 LA Grade B reflux esophagitis. - Small hiatal hernia without Cameron's erosions. - Non-bleeding gastric ulcer with no stigmata of bleeding. - Non-bleeding erosive gastropathy. - Normal examined duodenum. - The examination was otherwise normal. - No specimens collected.   Past Medical History:  Diagnosis Date  . Anemia    hx of  . Arthritis   . Dislocation of right knee with medial meniscus tear   . Headache    hx of migraines none since 1990's  . Hypothyroidism    1978, no problems since  . Pneumonia 1990's none since  . PVC (premature ventricular contraction)    199'0's none since  . SAH (subarachnoid hemorrhage) (HCC) 2016   On the right  . Stroke [facility](HCC)     Past Surgical History:   Procedure Laterality Date  . BACK SURGERY    . CERVICAL LAMINECTOMY  1986  . DILATION AND CURETTAGE OF UTERUS  1970  . ELBOW SURGERY Right 1993  . ESOPHAGOGASTRODUODENOSCOPY N/A 03/20/2019   Procedure: ESOPHAGOGASTRODUODENOSCOPY (EGD);  Surgeon: Tressia DanasBeavers, Kimberly, MD;  [facility];  Service: Gastroenterology;  Laterality: N/A;  . IR ANGIO INTRA EXTRACRAN SEL COM CAROTID INNOMINATE BILAT MOD SED  02/22/2019  . IR ANGIO VERTEBRAL SEL SUBCLAVIAN INNOMINATE UNI L MOD SED  02/22/2019  . IR ANGIO VERTEBRAL SEL VERTEBRAL UNI R MOD SED  02/22/2019  . KNEE ARTHROSCOPY WITH SUBCHONDROPLASTY Right 01/11/2018   Procedure: KNEE ARTHROSCOPY WITH SUBCHONDROPLASTY, PARTIAL MEDIAL MENISCECTOMY, CHONDROPLASTY OF THE MEDIAL PATELLA FEMORAL CHONDYL, MEDIAL PLICA  EXCISION;  Surgeon: Jodi GeraldsGraves, John, MD;  Location: [facility] ORS;  Service: Orthopedics;  Laterality: Right;  . LUMBAR LAMINECTOMY  1993   rods placed bone generated removed one year later   . MULTIPLE TOOTH EXTRACTIONS    . SHOULDER SURGERY Left    arthritis clean out and shortened collar bone dr Renae Ficklepaul  . TONSILLECTOMY  1970  . VAGINAL HYSTERECTOMY  1971   partial dr Dewaine Congerbarker  . WISDOM TOOTH EXTRACTION      Prior to Admission medications   Medication Sig Start Date End Date Taking? Authorizing Provider  acetaminophen (TYLENOL) 325 MG tablet Take 2 tablets (650 mg total) by mouth every 4 (four) hours as needed for mild pain (or temp > 37.5 C (99.5 F)). 03/27/19  Yes Angiulli, Mcarthur Rossettianiel J, PA-C  atorvastatin (LIPITOR) 40 MG tablet Take 1 tablet (40 mg total) by mouth daily at 6 PM. 02/23/19  Yes Azucena FallenLancaster, William C, MD  clopidogrel (PLAVIX) 75 MG tablet Take 1 tablet (75 mg total) by mouth daily. 02/24/19  Yes Azucena FallenLancaster, William C, MD  diclofenac sodium (VOLTAREN) 1 % GEL Apply 2 g topically 4 (four) times daily. 03/27/19  Yes Angiulli, Mcarthur Rossettianiel J, PA-C  docusate sodium (COLACE) 100 MG capsule Take 100 mg by mouth daily.   Yes [provider]   gabapentin (NEURONTIN) 100 MG capsule Take 100 mg by mouth 3 (three) times daily.   Yes [provider]  iron polysaccharides (NIFEREX) 150 MG capsule Take 1 capsule (150 mg total) by mouth daily. 03/27/19  Yes Angiulli, Mcarthur Rossetti, PA-C  losartan (COZAAR) 25 MG tablet Take 1 tablet (25 mg total) by mouth daily. 03/27/19  Yes Angiulli, Mcarthur Rossetti, PA-C  magnesium oxide (MAG-OX) 400 MG tablet Take 400 mg by mouth daily.   Yes [provider]  Melatonin 3 MG TABS Take 6 mg by mouth at bedtime.   Yes [provider]  Multiple Vitamin (MULTIVITAMIN WITH MINERALS) TABS tablet Take 1 tablet by mouth daily. 03/27/19  Yes Angiulli, Mcarthur Rossetti, PA-C  nicotine (NICODERM CQ - DOSED IN MG/24 HOURS) 14 mg/24hr patch Place 1 patch (14 mg total) onto the skin daily. 03/27/19  Yes Angiulli, Mcarthur Rossetti, PA-C  pantoprazole (PROTONIX) 40 MG tablet Take 1 tablet (40 mg total) by mouth 2 (two) times daily before a meal. 03/27/19  Yes Angiulli, Mcarthur Rossetti, PA-C  polyethylene glycol (MIRALAX / GLYCOLAX) 17 g packet Take 17 g by mouth 2 (two) times daily. 03/27/19  Yes Angiulli, Mcarthur Rossetti, PA-C  sucralfate (CARAFATE) 1 g tablet Take 1 g by mouth 4 (four) times daily. 04/04/19 04/18/19 Yes [provider]  aspirin 81 MG EC tablet Take 1 tablet (81 mg total) by mouth daily for 21 days. Patient not taking: Reported on 04/04/2019 03/27/19 04/17/19  Angiulli, Mcarthur Rossetti, PA-C  senna-docusate (SENOKOT-S) 8.6-50 MG tablet Take 2 tablets by mouth at bedtime. Patient not taking: Reported on 04/04/2019 03/27/19   Charlton Amor, PA-C    Current Facility-Administered Medications  Medication Dose Route Frequency Provider Last Rate Last Dose  . 0.9 %  sodium chloride infusion   Intravenous Continuous Noralee Stain, DO 100 mL/hr at 04/05/19 1028    . morphine 2 MG/ML injection 1 mg  1 mg Intravenous Q4H PRN Noralee Stain, DO      . Muscle Rub CREA   Topical PRN Noralee Stain, DO      . ondansetron [facility]) tablet 4 mg   4 mg Oral Q6H PRN Noralee Stain, DO       Or  . ondansetron [facility]) injection 4 mg  4 mg Intravenous Q6H PRN Noralee Stain, DO      . pantoprazole (PROTONIX) injection 40 mg  40 mg Intravenous Q12H Noralee Stain, DO   40 mg at 04/05/19 1014    Allergies as of 04/04/2019 - Review Complete 04/04/2019  Allergen Reaction Noted  . Lactose intolerance (gi) Diarrhea 03/20/2019  . Aspirin Other (See Comments) 09/24/2015  . Ibuprofen Other (See Comments) 03/15/2018  . Penicillins Rash and Other (See Comments) 09/24/2015    Family History  Problem Relation Age of Onset  . Stroke Mother   . Heart attack Father     Social History   Socioeconomic History  . Marital status: Widowed    Spouse name: Not on file  . Number of children: Not on file  . Years of education: Not on file  . Highest education level: Not on file  Occupational History  . Occupation: retired  Engineer, production  . Financial resource strain: Not on file  . Food insecurity:    Worry: Not on file    Inability: Not on file  . Transportation needs:    Medical: Not on file    Non-medical: Not on file  Tobacco Use  . Smoking status: Current Every Day Smoker    Packs/day: 1.00    Years: 72.00    Pack years: 72.00  Types: Cigarettes  . Smokeless tobacco: Never Used  Substance and Sexual Activity  . Alcohol use: No  . Drug use: No  . Sexual activity: Not on file  Lifestyle  . Physical activity:    Days per week: Not on file    Minutes per session: Not on file  . Stress: Not on file  Relationships  . Social connections:    Talks on phone: Not on file    Gets together: Not on file    Attends religious service: Not on file    Active member of club or organization: Not on file    Attends meetings of clubs or organizations: Not on file    Relationship status: Not on file  . Intimate partner violence:    Fear of current or ex partner: Not on file    Emotionally abused: Not on file    Physically abused: Not on  file    Forced sexual activity: Not on file  Other Topics Concern  . Not on file  Social History Narrative  . Not on file    Review of Systems: All systems reviewed and negative except where noted in HPI.  Physical Exam: Vital signs in last 24 hours: Temp:  [98.3 F (36.8 C)-99.6 F (37.6 C)] 98.3 F (36.8 C) (06/04 0533) Pulse Rate:  [70-83] 79 (06/04 0533) Resp:  [16-22] 18 (06/04 0533) BP: (92-142)/(38-78) 92/78 (06/04 0533) SpO2:  [94 %-100 %] 94 % (06/04 0533) Weight:  [78.5 kg] 78.5 kg (06/03 1249) Last BM Date: 04/03/19 General:   Alert, well-developed, female in NAD Psych:  Pleasant, cooperative. Normal mood and affect. Eyes:  Pupils equal, sclera clear, no icterus.   Conjunctiva pink. Ears:  Normal auditory acuity. Nose:  No deformity, discharge,  or lesions. Neck:  Supple; no masses Lungs:  A few bibasilar crackles.   Heart:  Regular rate and rhythm, no lower extremity edema Abdomen:  Soft, non-distended, nontender, BS active, no palp mass    Rectal:  Deferred  Msk:  Symmetrical without gross deformities. . Neurologic:  Alert and  oriented x4;  grossly normal neurologically. Skin:  Intact without significant lesions or rashes.   Intake/Output from previous day: 06/03 0701 - 06/04 0700 In: 1092.8 [I.V.:772.8; Blood:320] Out: -  Intake/Output this shift: No intake/output data recorded.  Lab Results: Recent Labs    04/04/19 1320 04/05/19 0102  WBC 9.1 10.3  HGB 6.9* 8.2*  HCT 22.5* 26.4*  PLT 665* 699*   BMET Recent Labs    04/04/19 1320 04/05/19 0102  NA 133* 134*  K 3.7 3.9  CL 98 103  CO2 25 22  GLUCOSE 98 94  BUN 11 11  CREATININE 0.49 0.52  CALCIUM 8.0* 7.7*     Studies/Results: No results found.   Willette Cluster, NP-C @  04/05/2019, 10:43 AM

## 2019-04-05 NOTE — Evaluation (Signed)
Physical Therapy Evaluation Patient Details Name: Connie Oconnor MRN: [id number] DOB: Dec 01, 1940 Today's Date: 04/05/2019   History of Present Illness  Pt is a 78 yo female admitted to ED on 6/3 from SNF for heme + stool. EGD in May reveals nonbleeding gastric ulcer, with erosive gastropathy. 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 PMH includes anemia, headaches, PNA, PVCs, SAH 2016, vascular dementia, hx of GI hemorrhage, emphysema, R knee scope with subchondroplasty 2019, lumbar laminectomy, L shoulder scope.   Clinical Impression   Pt presents with R hip pain, L hemiparesis UE>LE, impaired sitting balance, difficulty performing bed mobility tasks, and decreased activity tolerance. Pt to benefit from acute PT to address deficits. Pt tolerated sitting EOB with PT for ~3 minutes, limited by pain and fatigue. Pt required mod-max assist +2 for all mobility, pt states she has not walked since prior to CVA. PT recommending CIR to return to PLOF, vs SNF level of care. Pt lives alone and does not have 24/7 assist at this time. PT to progress mobility as tolerated, and will continue to follow acutely.      Follow Up Recommendations CIR;SNF;Supervision/Assistance - 24 hour    Equipment Recommendations  None recommended by PT    Recommendations for Other Services       Precautions / Restrictions Precautions Precautions: Fall Precaution Comments: L hemiparesis UE>LE, L shoulder hypotonia  Restrictions Weight Bearing Restrictions: No      Mobility  Bed Mobility Overal bed mobility: Needs Assistance Bed Mobility: Rolling;Sidelying to Sit;Sit to Supine Rolling: Mod assist Sidelying to sit: Max assist;+2 for physical assistance;HOB elevated   Sit to supine: Max assist;+2 for physical assistance;HOB elevated   General bed mobility comments: Mod assist for completion of roll onto L side with use of bedrails, pt limited in roll to R due to R hip pain. Max assist +2  for supine<>sit for trunk elevation/lowering, LE lifting and translation to and from EOB, positioning pt in bed with use of bed pads, and scooting to and from EOB with bed pads.   Transfers Overall transfer level: (NT - pt limited by pain and LE weakness)                  Ambulation/Gait                Stairs            Wheelchair Mobility    Modified Rankin (Stroke Patients Only)       Balance Overall balance assessment: Needs assistance Sitting-balance support: Bilateral upper extremity supported;Feet supported Sitting balance-Leahy Scale: Poor Sitting balance - Comments: Min-mod assist for sitting EOB, unable to sit upright without PT support. Posterior leaning when feet unsupported  Postural control: Posterior lean Standing balance support: (NT)                                 Pertinent Vitals/Pain Pain Assessment: Faces Faces Pain Scale: Hurts whole lot Pain Location: R hip with mobility  Pain Descriptors / Indicators: Sore Pain Intervention(s): Monitored during session;Repositioned;Limited activity within patient's tolerance    Home Living Family/patient expects to be discharged to:: Private residence Living Arrangements: Alone Available Help at Discharge: Friend(s);Family;Available PRN/intermittently Type of Home: House Home Access: Ramped entrance     Home Layout: One level Home Equipment: Emergency planning/management officer - 2 wheels;Cane - single point;Wheelchair - manual      Prior Function Level  of Independence: Needs assistance   Gait / Transfers Assistance Needed: Pt reports needing assist with mobility PTA, was at rehab for CVA. Pt states she was not walking, was using w/c pushed by staff. Prior to this, pt was ambulating with RW.   ADL's / Homemaking Assistance Needed: Pt reports assist with toileting.         Hand Dominance   Dominant Hand: Left    Extremity/Trunk Assessment   Upper Extremity Assessment Upper Extremity  Assessment: LUE deficits/detail LUE Deficits / Details: hypotonia vs flaccidity LUE proximally moreso than distally. Weak grip strength    Lower Extremity Assessment Lower Extremity Assessment: LLE deficits/detail;Generalized weakness RLE: Unable to fully assess due to pain(hip pain) LLE Deficits / Details: 2/5 PF, hip flexion; 3/5 knee extension, DF LLE Sensation: WNL    Cervical / Trunk Assessment Cervical / Trunk Assessment: Normal  Communication   Communication: No difficulties  Cognition Arousal/Alertness: Awake/alert Behavior During Therapy: WFL for tasks assessed/performed Overall Cognitive Status: Within Functional Limits for tasks assessed                                 General Comments: Pt required mod verbal encouragement to participate in PT      General Comments General comments (skin integrity, edema, etc.): Pt with DOE 2/4, sats 96% on RA and HR 84 bpm.     Exercises     Assessment/Plan    PT Assessment Patient needs continued PT services  PT Problem List Decreased strength;Decreased mobility;Impaired tone;Decreased range of motion;Decreased activity tolerance;Decreased balance;Pain;Decreased knowledge of use of DME       PT Treatment Interventions DME instruction;Functional mobility training;Balance training;Patient/family education;Gait training;Therapeutic activities;Neuromuscular re-education;Therapeutic exercise    PT Goals (Current goals can be found in the Care Plan section)  Acute Rehab PT Goals Patient Stated Goal: decrease R hip pain  PT Goal Formulation: With patient Time For Goal Achievement: 04/12/19 Potential to Achieve Goals: Good    Frequency Min 2X/week   Barriers to discharge Decreased caregiver support      Co-evaluation               AM-PAC PT "6 Clicks" Mobility  Outcome Measure Help needed turning from your back to your side while in a flat bed without using bedrails?: Total Help needed moving from lying  on your back to sitting on the side of a flat bed without using bedrails?: Total Help needed moving to and from a bed to a chair (including a wheelchair)?: Total Help needed standing up from a chair using your arms (e.g., wheelchair or bedside chair)?: Total Help needed to walk in hospital room?: Total Help needed climbing 3-5 steps with a railing? : Total 6 Click Score: 6    End of Session   Activity Tolerance: Patient limited by pain;Patient limited by fatigue Patient left: in bed;with bed alarm set;with call bell/phone within reach Nurse Communication: Mobility status PT Visit Diagnosis: Hemiplegia and hemiparesis;Other abnormalities of gait and mobility (R26.89) Hemiplegia - Right/Left: Left Hemiplegia - dominant/non-dominant: Dominant Hemiplegia - caused by: Cerebral infarction    Time: [id number] PT Time Calculation (min) (ACUTE ONLY): 28 min   Charges:   PT Evaluation $PT Eval Low Complexity: 1 Low PT Treatments $Therapeutic Activity: 8-22 mins       Mauri Tolen Terrial Rhodes Cleva Camero, PT Acute Rehabilitation Services Pager [phone]  Office [phone]   Bernadetta Roell D Mikaylee Arseneau 04/05/2019, 6:03 PM

## 2019-04-06 ENCOUNTER — Encounter (HOSPITAL_COMMUNITY): Payer: Self-pay | Admitting: Certified Registered Nurse Anesthetist

## 2019-04-06 ENCOUNTER — Encounter (HOSPITAL_COMMUNITY)
Admission: EM | Disposition: A | Discharge status: Skilled Nursing Facility | Payer: Self-pay | Source: Skilled Nursing Facility | Attending: Internal Medicine

## 2019-04-06 LAB — HEMOGLOBIN AND HEMATOCRIT, BLOOD
HCT: 26.3 % — ABNORMAL LOW (ref 36.0–46.0)
HCT: 26.6 % — ABNORMAL LOW (ref 36.0–46.0)
Hemoglobin: 8.1 g/dL — ABNORMAL LOW (ref 12.0–15.0)
Hemoglobin: 8.3 g/dL — ABNORMAL LOW (ref 12.0–15.0)

## 2019-04-06 LAB — IRON AND TIBC
Iron: 11 ug/dL — ABNORMAL LOW (ref 28–170)
Saturation Ratios: 6 % — ABNORMAL LOW (ref 10.4–31.8)
TIBC: 185 ug/dL — ABNORMAL LOW (ref 250–450)
UIBC: 174 ug/dL

## 2019-04-06 LAB — FERRITIN: Ferritin: 204 ng/mL (ref 11–307)

## 2019-04-06 LAB — SEDIMENTATION RATE: Sed Rate: 135 mm/hr — ABNORMAL HIGH (ref 0–22)

## 2019-04-06 LAB — C-REACTIVE PROTEIN: CRP: 12.6 mg/dL — ABNORMAL HIGH (ref <1.0)

## 2019-04-06 SURGERY — EGD (ESOPHAGOGASTRODUODENOSCOPY)
Anesthesia: Monitor Anesthesia Care

## 2019-04-06 MED ORDER — LOSARTAN POTASSIUM 50 MG PO TABS
25.0000 mg | ORAL_TABLET | Freq: Every day | ORAL | Status: DC
  Administered 2019-04-06 – 2019-04-09 (×4): 25 mg via ORAL
  Filled 2019-04-06 (×4): qty 1

## 2019-04-06 MED ORDER — ATORVASTATIN CALCIUM 40 MG PO TABS
40.0000 mg | ORAL_TABLET | Freq: Every day | ORAL | Status: DC
  Administered 2019-04-06 – 2019-04-08 (×3): 40 mg via ORAL
  Filled 2019-04-06 (×3): qty 1

## 2019-04-06 NOTE — Progress Notes (Signed)
Rehab Admissions Coordinator Note:  Per PT recommendation, this patient was screened by Nanine Means for appropriateness for an Inpatient Acute Rehab Consult.  Pt recently had a rehab stay in [facility] from 4/24-5/26 after acute CVA. Due to limited advances, pt was discharged to SNF. At this time, we are recommending return to Skilled Nursing Facility.  Nanine Means 04/06/2019, 8:10 AM  I can be reached at [phone].

## 2019-04-06 NOTE — NC FL2 (Signed)
[place] MEDICAID FL2 LEVEL OF CARE SCREENING TOOL     IDENTIFICATION  Patient Name: Connie MarusRebecca Oconnor Birthdate: June 01, 1941 Sex: female Admission Date (Current Location): 04/04/2019  [facility] [place]   Facility and Address:  [facility],  [address], [place] [id number]      Provider Number: [id number]  Attending Physician Name and Address:  Noralee Stainhoi, Jennifer, DO  Relative Name and Phone Number:  Riesa PopeKathy Black-daughter [phone]-cell  Arline AspCindy Light-daughter [phone]    Current Level of Care: Other (Comment) Recommended Level of Care: Skilled Nursing Facility Prior Approval Number:    Date Approved/Denied: 04/06/19 PASRR Number: [id number]  Discharge Plan: SNF    Current Diagnoses: Patient Active Problem List   Diagnosis Date Noted  . GI bleed 04/04/2019  . HLD (hyperlipidemia) 04/04/2019  . Vascular dementia without behavioral disturbance (HCC)   . 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 Gastric erosion   . Gastrointestinal hemorrhage   . Acute respiratory failure with hypoxia (HCC)   . Hyponatremia   . Tobacco abuse   . Pulmonary emphysema (HCC)   . Pulmonary nodules   . Primary osteoarthritis of right hip   . Subacute confusional state   . Lethargy   . Confusion, postoperative   . Slow transit constipation   . Pain of left calf   . Impulsiveness   . Dysphagia, post-stroke   . Labile blood pressure   . Hyperglycemia   . Hypotension   . Hypoalbuminemia due to protein-calorie malnutrition (HCC)   . Acute blood loss anemia   . Hypoglycemia   . Essential hypertension   . History of traumatic brain injury   . Lacunar infarct, acute (HCC) 02/23/2019  . 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 Hypothyroidism (acquired) 02/20/2019  . Closed fracture of medial portion of right tibial plateau 01/11/2018  . Acute medial meniscal  tear, right, initial encounter 01/11/2018  . SAH (subarachnoid hemorrhage) (HCC) 03/29/2017    Orientation RESPIRATION BLADDER Height & Weight     Self, Situation, Place  Normal Incontinent Weight: 173 lb 1 oz (78.5 kg) Height:  5\' 5"  (165.1 cm)  BEHAVIORAL SYMPTOMS/MOOD NEUROLOGICAL BOWEL NUTRITION STATUS      Continent    AMBULATORY STATUS COMMUNICATION OF NEEDS Skin   Extensive Assist Verbally Normal                       Personal Care Assistance Level of Assistance    Bathing Assistance: Limited assistance Feeding assistance: Limited assistance Dressing Assistance: Limited assistance     Functional Limitations Info             SPECIAL CARE FACTORS FREQUENCY  PT (By licensed PT), OT (By licensed OT), Speech therapy     PT Frequency: 5x/week OT Frequency: 5x/week            Contractures Contractures Info: Not present    Additional Factors Info  Code Status, Allergies Code Status Info: DNR Allergies Info: Aspirin, Pencillins           Current Medications (04/06/2019):  This is the current hospital active medication list Current Facility-Administered Medications  Medication Dose Route Frequency Provider Last Rate Last Dose  . 0.9 %  sodium chloride infusion   Intravenous Continuous Noralee Stainhoi, Jennifer, DO 100 mL/hr at 04/06/19 0914    . lidocaine (LIDODERM) 5 % 1 patch  1 patch Transdermal Q24H Noralee Stainhoi, Jennifer,  DO   1 patch at 04/06/19 1018  . morphine 2 MG/ML injection 1 mg  1 mg Intravenous Q4H PRN Noralee Stain, DO      . Muscle Rub CREA   Topical PRN Noralee Stain, DO      . ondansetron [facility]) tablet 4 mg  4 mg Oral Q6H PRN Noralee Stain, DO       Or  . ondansetron [facility]) injection 4 mg  4 mg Intravenous Q6H PRN Noralee Stain, DO      . pantoprazole (PROTONIX) injection 40 mg  40 mg Intravenous Q12H Noralee Stain, DO   40 mg at 04/06/19 1018     Discharge Medications: Please see discharge summary for a list of discharge  medications.  Relevant Imaging Results:  Relevant Lab Results:   Additional Information SSN:[id number]  Coralyn Helling, LCSW

## 2019-04-06 NOTE — Progress Notes (Signed)
PROGRESS NOTE    Connie Oconnor  HKF:[id number] DOB: [birth year] DOA: 04/04/2019 PCP: Heide Scales, PA-C     Brief Narrative:  Connie Oconnor is a 78 y.o. female with medical history significant of right SAH from a fall in May 2018, hypothyroidism, tobacco abuse, back surgery. She was hospitalized in April 2020 due to acute left-sided weakness, MRI revealed thrombotic stroke. Patient remained on aspirin, plavix and sent to rehab. During that admission, she also had acute blood loss anemia with positive hemoccult. She underwent EGD showing nonbleeding gastric ulcer erosive gastropathy recommending PPI twice daily x8 weeks then daily recommended Carafate 1 g 4 times daily for 2 weeks.   EGD 03/20/2019 Dr. Orvan Falconer revealed: - LA Grade B reflux esophagitis. - Small hiatal hernia without Cameron's erosions. - Non-bleeding gastric ulcer with no stigmata of bleeding. - Non-bleeding erosive gastropathy. - Normal examined duodenum. - The examination was otherwise normal. - No specimens collected.  Colonoscopy 2009: normal exam at that time  Her main complaint is weakness and fatigue.  She denies any dizziness or lightheadedness, no chest pain, nausea, vomiting or abdominal pain.  She also admits to some mild shortness of breath without any cough.  No fevers.  New events last 24 hours / Subjective: Hgb remains stable today. States she doesn't feel well overall but cannot characterize what is wrong.   Assessment & Plan:   Principal Problem:   Acute blood loss anemia Active Problems:   Essential hypertension   GI bleed   HLD (hyperlipidemia)   Acute blood loss anemia -Previous EGD revealed esophagitis, nonbleeding gastric ulcer, nonbleeding erosive gastropathy -PPI IV  -Carafate once able to take PO  -GI consulted, family declined EGD at this time  -Trend H&H, stable this morning Hgb 8.1  -Discontinue supplemental iron   Recent hx right lacunar infarct -Hold aspirin, plavix  ?resume   HTN -Resume cozaar  HLD -Resume lipitor   Thrombocytosis -?Etiology. Check iron studies, inflammatory markers, check blood smear     DVT prophylaxis: SCD Code Status: DNR Family Communication: Spoke with daughter over the phone  Disposition Plan: Pending GI consult   Consultants:   GI   Procedures:   None   Antimicrobials:  Anti-infectives (From admission, onward)   None       Objective: Vitals:   04/05/19 1334 04/05/19 2057 04/06/19 0604 04/06/19 1423  BP: (!) 147/62 (!) 146/57 (!) 139/57 (!) 151/61  Pulse: 79 76 72 72  Resp:  18 20 (!) 21  Temp: 99 F (37.2 C) 98.4 F (36.9 C) 98 F (36.7 C) 98.4 F (36.9 C)  TempSrc: Oral Oral Oral   SpO2: 100% 95% 97% 97%  Weight:      Height:        Intake/Output Summary (Last 24 hours) at 04/06/2019 1433 Last data filed at 04/06/2019 1100 Gross per 24 hour  Intake 2509.02 ml  Output 400 ml  Net 2109.02 ml   Filed Weights   04/04/19 1249  Weight: 78.5 kg    Examination: General exam: Appears calm and comfortable  Respiratory system: Clear to auscultation. Respiratory effort normal. Cardiovascular system: S1 & S2 heard, RRR. No JVD, murmurs, rubs, gallops or clicks. No pedal edema. Gastrointestinal system: Abdomen is nondistended, soft and nontender. No organomegaly or masses felt. Normal bowel sounds heard. Central nervous system: Alert and oriented. No focal neurological deficits. Extremities: Symmetric 5 x 5 power. Skin: No rashes, lesions or ulcers Psychiatry: Judgement and insight appear normal. Mood & affect appropriate.  Data Reviewed: I have personally reviewed following labs and imaging studies  CBC: Recent Labs  Lab 04/04/19 1320 04/05/19 0102 04/05/19 1252 04/05/19 1809 04/06/19 0028 04/06/19 0726  WBC 9.1 10.3  --   --   --   --   NEUTROABS 7.4  --   --   --   --   --   HGB 6.9* 8.2* 8.6* 8.4* 8.3* 8.1*  HCT 22.5* 26.4* 28.6* 27.2* 26.6* 26.3*  MCV 82.7 84.9  --   --    --   --   PLT 665* 699*  --   --   --   --    Basic Metabolic Panel: Recent Labs  Lab 04/04/19 1320 04/05/19 0102  NA 133* 134*  K 3.7 3.9  CL 98 103  CO2 25 22  GLUCOSE 98 94  BUN 11 11  CREATININE 0.49 0.52  CALCIUM 8.0* 7.7*   GFR: Estimated Creatinine Clearance: 61 mL/min (by C-G formula based on SCr of 0.52 mg/dL). Liver Function Tests: No results for input(s): AST, ALT, ALKPHOS, BILITOT, PROT, ALBUMIN in the last 168 hours. No results for input(s): LIPASE, AMYLASE in the last 168 hours. No results for input(s): AMMONIA in the last 168 hours. Coagulation Profile: No results for input(s): INR, PROTIME in the last 168 hours. Cardiac Enzymes: No results for input(s): CKTOTAL, CKMB, CKMBINDEX, TROPONINI in the last 168 hours. BNP (last 3 results) No results for input(s): PROBNP in the last 8760 hours. HbA1C: No results for input(s): HGBA1C in the last 72 hours. CBG: No results for input(s): GLUCAP in the last 168 hours. Lipid Profile: No results for input(s): CHOL, HDL, LDLCALC, TRIG, CHOLHDL, LDLDIRECT in the last 72 hours. Thyroid Function Tests: No results for input(s): TSH, T4TOTAL, FREET4, T3FREE, THYROIDAB in the last 72 hours. Anemia Panel: No results for input(s): VITAMINB12, FOLATE, FERRITIN, TIBC, IRON, RETICCTPCT in the last 72 hours. Sepsis Labs: No results for input(s): PROCALCITON, LATICACIDVEN in the last 168 hours.  Recent Results (from the past 240 hour(s))  SARS Coronavirus 2 (CEPHEID - Performed in [facility] hospital lab), Hosp Order     Status: None   Collection Time: 04/04/19  2:28 PM  Result Value Ref Range Status   SARS Coronavirus 2 NEGATIVE NEGATIVE Final    Comment: (NOTE) If result is NEGATIVE SARS-CoV-2 target nucleic acids are NOT DETECTED. The SARS-CoV-2 RNA is generally detectable in upper and lower  respiratory specimens during the acute phase of infection. The lowest  concentration of SARS-CoV-2 viral copies this assay can detect  is 250  copies / mL. A negative result does not preclude SARS-CoV-2 infection  and should not be used as the sole basis for treatment or other  patient management decisions.  A negative result may occur with  improper specimen collection / handling, submission of specimen other  than nasopharyngeal swab, presence of viral mutation(s) within the  areas targeted by this assay, and inadequate number of viral copies  (<250 copies / mL). A negative result must be combined with clinical  observations, patient history, and epidemiological information. If result is POSITIVE SARS-CoV-2 target nucleic acids are DETECTED. The SARS-CoV-2 RNA is generally detectable in upper and lower  respiratory specimens dur ing the acute phase of infection.  Positive  results are indicative of active infection with SARS-CoV-2.  Clinical  correlation with patient history and other diagnostic information is  necessary to determine patient infection status.  Positive results do  not rule out bacterial infection  or co-infection with other viruses. If result is PRESUMPTIVE POSTIVE SARS-CoV-2 nucleic acids MAY BE PRESENT.   A presumptive positive result was obtained on the submitted specimen  and confirmed on repeat testing.  While 2019 novel coronavirus  (SARS-CoV-2) nucleic acids may be present in the submitted sample  additional confirmatory testing may be necessary for epidemiological  and / or clinical management purposes  to differentiate between  SARS-CoV-2 and other Sarbecovirus currently known to infect humans.  If clinically indicated additional testing with an alternate test  methodology [phone] is advised. The SARS-CoV-2 RNA is generally  detectable in upper and lower respiratory sp ecimens during the acute  phase of infection. The expected result is Negative. Fact Sheet for Patients:  BoilerBrush.com.cyhttps://www.fda.gov/media/136312/download Fact Sheet for Healthcare Providers:  https://pope.com/https://www.fda.gov/media/136313/download This test is not yet approved or cleared by the [place] FDA and has been authorized for detection and/or diagnosis of SARS-CoV-2 by FDA under an Emergency Use Authorization (EUA).  This EUA will remain in effect (meaning this test can be used) for the duration of the COVID-19 declaration under Section 564(b)(1) of the Act, 21 U.S.C. section 360bbb-3(b)(1), unless the authorization is terminated or revoked sooner. Performed at [facility], [address] [place], [place] [address]        Radiology Studies: No results found.    Scheduled Meds: . lidocaine  1 patch Transdermal Q24H  . pantoprazole (PROTONIX) IV  40 mg Intravenous Q12H   Continuous Infusions: . sodium chloride 100 mL/hr at 04/06/19 0914     LOS: 1 day    Time spent: 25 minutes   Noralee StainJennifer Kenise Barraco, DO [facility] [email or web address] 04/06/2019, 2:33 PM

## 2019-04-06 NOTE — Anesthesia Preprocedure Evaluation (Deleted)
Anesthesia Evaluation    Reviewed: Allergy & Precautions, Patient's Chart, lab work & pertinent test results  History of Anesthesia Complications Negative for: history of anesthetic complications  Airway        Dental   Pulmonary COPD, Current Smoker,           Cardiovascular hypertension, Pt. on medications      Neuro/Psych  Headaches, PSYCHIATRIC DISORDERS Dementia  Hx SAH  CVA    GI/Hepatic Neg liver ROS, PUD,   Endo/Other  Hypothyroidism   Renal/GU negative Renal ROS     Musculoskeletal  (+) Arthritis ,   Abdominal   Peds  Hematology  (+) anemia ,  Thrombocytosis    Anesthesia Other Findings   Reproductive/Obstetrics                             Anesthesia Physical Anesthesia Plan  ASA: III  Anesthesia Plan: MAC   Post-op Pain Management:    Induction: Intravenous  PONV Risk Score and Plan: 2 and Treatment may vary due to age or medical condition and Propofol infusion  Airway Management Planned: Nasal Cannula and Natural Airway  Additional Equipment: None  Intra-op Plan:   Post-operative Plan:   Informed Consent:   Patient has DNR.     Plan Discussed with: CRNA and Anesthesiologist  Anesthesia Plan Comments:         Anesthesia Quick Evaluation

## 2019-04-06 NOTE — TOC Initial Note (Signed)
Transition of Care [facility](TOC) - Initial/Assessment Note    Patient Details  Name: Connie Oconnor MRN: [id number] Date of Birth: [birth year]  Transition of Care [facility](TOC) CM/SW Contact:    Coralyn HellingBernette Dacota Ruben, LCSW Phone Number: 04/06/2019, 12:36 PM  Clinical Narrative:    Patient and family agreeable to SNF for rehab. Patient faxed out to facilities.                Expected Discharge Plan: Skilled Nursing Facility Barriers to Discharge: Continued Medical Work up   Patient Goals and CMS Choice   CMS Medicare.gov Compare Post Acute Care list provided to:: Patient Represenative (must comment) Choice offered to / list presented to : Adult Children  Expected Discharge Plan and Services Expected Discharge Plan: Skilled Nursing Facility     Post Acute Care Choice: Skilled Nursing Facility Living arrangements for the past 2 months: Skilled Nursing Facility(SNF for rehab)                                      Prior Living Arrangements/Services Living arrangements for the past 2 months: Skilled Nursing Facility(SNF for rehab) Lives with:: Self Patient language and need for interpreter reviewed:: Yes Do you feel safe going back to the place where you live?: Yes      Need for Family Participation in Patient Care: Yes (Comment) Care giver support system in place?: Yes (comment)   Criminal Activity/Legal Involvement Pertinent to Current Situation/Hospitalization: No - Comment as needed  Activities of Daily Living Home Assistive Devices/Equipment: Walker (specify type) ADL Screening (condition at time of admission) Patient's cognitive ability adequate to safely complete daily activities?: No Is the patient deaf or have difficulty hearing?: No Does the patient have difficulty seeing, even when wearing glasses/contacts?: No Does the patient have difficulty concentrating, remembering, or making decisions?: No Patient able to express need for assistance with ADLs?: Yes Does the patient have  difficulty dressing or bathing?: Yes Independently performs ADLs?: No Communication: Needs assistance Is this a change from baseline?: Pre-admission baseline Dressing (OT): Needs assistance Is this a change from baseline?: Pre-admission baseline Grooming: Needs assistance Is this a change from baseline?: Pre-admission baseline Feeding: Needs assistance Is this a change from baseline?: Pre-admission baseline Bathing: Needs assistance Is this a change from baseline?: Pre-admission baseline Toileting: Needs assistance Is this a change from baseline?: Pre-admission baseline In/Out Bed: Needs assistance Is this a change from baseline?: Pre-admission baseline Walks in Home: Needs assistance Is this a change from baseline?: Pre-admission baseline Does the patient have difficulty walking or climbing stairs?: Yes Weakness of Legs: Both Weakness of Arms/Hands: Both  Permission Sought/Granted Permission sought to share information with : Facility Medical sales representativeContact Representative, Family Supports Permission granted to share information with : Yes, Verbal Permission Granted  Share Information with NAME: [name]           Emotional Assessment Appearance:: Appears stated age Attitude/Demeanor/Rapport: Unable to Assess Affect (typically observed): Accepting Orientation: : Oriented to Self, Oriented to Place, Oriented to Situation Alcohol / Substance Use: Not Applicable Psych Involvement: No (comment)  Admission diagnosis:  Rectal bleeding [K62.5] Symptomatic anemia [D64.9] Patient Active Problem List   Diagnosis Date Noted  . GI bleed 04/04/2019  . HLD (hyperlipidemia) 04/04/2019  . Vascular dementia without behavioral disturbance (HCC)   . 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 Gastric erosion   . Gastrointestinal hemorrhage   . Acute respiratory failure  with hypoxia (HCC)   . Hyponatremia   . Tobacco abuse   . Pulmonary emphysema (HCC)   . Pulmonary nodules    . Primary osteoarthritis of right hip   . Subacute confusional state   . Lethargy   . Confusion, postoperative   . Slow transit constipation   . Pain of left calf   . Impulsiveness   . Dysphagia, post-stroke   . Labile blood pressure   . Hyperglycemia   . Hypotension   . Hypoalbuminemia due to protein-calorie malnutrition (HCC)   . Acute blood loss anemia   . Hypoglycemia   . Essential hypertension   . History of traumatic brain injury   . Lacunar infarct, acute (HCC) 02/23/2019  . 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 Hypothyroidism (acquired) 02/20/2019  . Closed fracture of medial portion of right tibial plateau 01/11/2018  . Acute medial meniscal tear, right, initial encounter 01/11/2018  . SAH (subarachnoid hemorrhage) (HCC) 03/29/2017   PCP:  Heide Scales, PA-C Pharmacy:   [facility] [phone] - [place], [place] - [address] [place] [address] Phone: [phone] Fax: [phone]     Social Determinants of Health (SDOH) Interventions    Readmission Risk Interventions Readmission Risk Prevention Plan 04/06/2019  Transportation Screening Complete  Home Care Screening Complete  Some recent data might be hidden

## 2019-04-07 LAB — CBC WITH DIFFERENTIAL/PLATELET
Abs Immature Granulocytes: 0.06 10*3/uL (ref 0.00–0.07)
Basophils Absolute: 0 10*3/uL (ref 0.0–0.1)
Basophils Relative: 0 %
Eosinophils Absolute: 0.1 10*3/uL (ref 0.0–0.5)
Eosinophils Relative: 1 %
HCT: 28.7 % — ABNORMAL LOW (ref 36.0–46.0)
Hemoglobin: 8.6 g/dL — ABNORMAL LOW (ref 12.0–15.0)
Immature Granulocytes: 1 %
Lymphocytes Relative: 8 %
Lymphs Abs: 0.7 10*3/uL (ref 0.7–4.0)
MCH: 25.3 pg — ABNORMAL LOW (ref 26.0–34.0)
MCHC: 30 g/dL (ref 30.0–36.0)
MCV: 84.4 fL (ref 80.0–100.0)
Monocytes Absolute: 0.8 10*3/uL (ref 0.1–1.0)
Monocytes Relative: 9 %
Neutro Abs: 7.1 10*3/uL (ref 1.7–7.7)
Neutrophils Relative %: 81 %
Platelets: 555 10*3/uL — ABNORMAL HIGH (ref 150–400)
RBC: 3.4 MIL/uL — ABNORMAL LOW (ref 3.87–5.11)
RDW: 15.6 % — ABNORMAL HIGH (ref 11.5–15.5)
WBC: 8.8 10*3/uL (ref 4.0–10.5)
nRBC: 0 % (ref 0.0–0.2)

## 2019-04-07 MED ORDER — SUCRALFATE 1 G PO TABS
1.0000 g | ORAL_TABLET | Freq: Four times a day (QID) | ORAL | Status: DC
  Administered 2019-04-07 – 2019-04-09 (×7): 1 g via ORAL
  Filled 2019-04-07 (×7): qty 1

## 2019-04-07 NOTE — Progress Notes (Addendum)
PROGRESS NOTE    Connie MarusRebecca Skarzynski  ZOX:[id number] DOB: [birth year] DOA: 04/04/2019 PCP: Heide ScalesNelson, Kristen M, PA-C     Brief Narrative:  Connie Oconnor is a 78 y.o. female with medical history significant of right SAH from a fall in May 2018, hypothyroidism, tobacco abuse, back surgery. She was hospitalized in April 2020 due to acute left-sided weakness, MRI revealed thrombotic stroke. Patient remained on aspirin, plavix and sent to rehab. During that admission, she also had acute blood loss anemia with positive hemoccult. She underwent EGD showing nonbleeding gastric ulcer erosive gastropathy recommending PPI twice daily x8 weeks then daily recommended Carafate 1 g 4 times daily for 2 weeks.   EGD 03/20/2019 Dr. Orvan FalconerBeavers revealed: - LA Grade B reflux esophagitis. - Small hiatal hernia without Cameron's erosions. - Non-bleeding gastric ulcer with no stigmata of bleeding. - Non-bleeding erosive gastropathy. - Normal examined duodenum. - The examination was otherwise normal. - No specimens collected.  Colonoscopy 2009: normal exam at that time  Her main complaint is weakness and fatigue.  She denies any dizziness or lightheadedness, no chest pain, nausea, vomiting or abdominal pain.  She also admits to some mild shortness of breath without any cough.  No fevers.  New events last 24 hours / Subjective: No new issues   Assessment & Plan:   Principal Problem:   Acute blood loss anemia Active Problems:   Essential hypertension   GI bleed   HLD (hyperlipidemia)   Acute blood loss anemia -Previous EGD revealed esophagitis, nonbleeding gastric ulcer, nonbleeding erosive gastropathy -GI consulted, family declined EGD at this time  -PPI IV  -Carafate resumed  -Trend H&H, stable this morning Hgb 8.6 -Discontinue supplemental iron  -Advance diet today for full liquid diet   Recent hx right lacunar infarct -Hold aspirin, plavix for now. Resume when okay with GI  HTN -Continue cozaar   HLD -Continue lipitor   Thrombocytosis -?Etiology -Possibly due to iron deficiency, but inflammatory markers are elevated. Check blood smear. Discussed with family to follow up for outpatient work up     DVT prophylaxis: SCD Code Status: DNR Family Communication: Spoke with daughter over the phone  Disposition Plan: Pending GI recommendations for discharge, awaiting SNF insurance auth    Consultants:   GI   Procedures:   None   Antimicrobials:  Anti-infectives (From admission, onward)   None       Objective: Vitals:   04/06/19 1423 04/06/19 2131 04/07/19 0426 04/07/19 1057  BP: (!) 151/61 (!) 151/65 (!) 155/58   Pulse: 72 78 72   Resp: (!) 21 20 16    Temp: 98.4 F (36.9 C) 99.5 F (37.5 C) 98.1 F (36.7 C)   TempSrc:      SpO2: 97% 94% 100% 95%  Weight:      Height:        Intake/Output Summary (Last 24 hours) at 04/07/2019 1306 Last data filed at 04/07/2019 0400 Gross per 24 hour  Intake 2131 ml  Output 1300 ml  Net 831 ml   Filed Weights   04/04/19 1249  Weight: 78.5 kg    Examination: General exam: Appears calm and comfortable, fatigued appearing  Respiratory system: Clear to auscultation. Respiratory effort normal. Cardiovascular system: S1 & S2 heard, RRR. No JVD, murmurs, rubs, gallops or clicks. No pedal edema. Gastrointestinal system: Abdomen is nondistended, soft and nontender.  Central nervous system: Alert  Extremities: Symmetric 5 x 5 power. Skin: No rashes, lesions or ulcers Psychiatry: Judgement and insight appear stable  Data Reviewed: I have personally reviewed following labs and imaging studies  CBC: Recent Labs  Lab 04/04/19 1320 04/05/19 0102 04/05/19 1252 04/05/19 1809 04/06/19 0028 04/06/19 0726 04/07/19 0624  WBC 9.1 10.3  --   --   --   --  8.8  NEUTROABS 7.4  --   --   --   --   --  7.1  HGB 6.9* 8.2* 8.6* 8.4* 8.3* 8.1* 8.6*  HCT 22.5* 26.4* 28.6* 27.2* 26.6* 26.3* 28.7*  MCV 82.7 84.9  --   --   --   --   84.4  PLT 665* 699*  --   --   --   --  555*   Basic Metabolic Panel: Recent Labs  Lab 04/04/19 1320 04/05/19 0102  NA 133* 134*  K 3.7 3.9  CL 98 103  CO2 25 22  GLUCOSE 98 94  BUN 11 11  CREATININE 0.49 0.52  CALCIUM 8.0* 7.7*   GFR: Estimated Creatinine Clearance: 61 mL/min (by C-G formula based on SCr of 0.52 mg/dL). Liver Function Tests: No results for input(s): AST, ALT, ALKPHOS, BILITOT, PROT, ALBUMIN in the last 168 hours. No results for input(s): LIPASE, AMYLASE in the last 168 hours. No results for input(s): AMMONIA in the last 168 hours. Coagulation Profile: No results for input(s): INR, PROTIME in the last 168 hours. Cardiac Enzymes: No results for input(s): CKTOTAL, CKMB, CKMBINDEX, TROPONINI in the last 168 hours. BNP (last 3 results) No results for input(s): PROBNP in the last 8760 hours. HbA1C: No results for input(s): HGBA1C in the last 72 hours. CBG: No results for input(s): GLUCAP in the last 168 hours. Lipid Profile: No results for input(s): CHOL, HDL, LDLCALC, TRIG, CHOLHDL, LDLDIRECT in the last 72 hours. Thyroid Function Tests: No results for input(s): TSH, T4TOTAL, FREET4, T3FREE, THYROIDAB in the last 72 hours. Anemia Panel: Recent Labs    04/06/19 1435  FERRITIN 204  TIBC 185*  IRON 11*   Sepsis Labs: No results for input(s): PROCALCITON, LATICACIDVEN in the last 168 hours.  Recent Results (from the past 240 hour(s))  SARS Coronavirus 2 (CEPHEID - Performed in [facility] lab), Hosp Order     Status: None   Collection Time: 04/04/19  2:28 PM  Result Value Ref Range Status   SARS Coronavirus 2 NEGATIVE NEGATIVE Final    Comment: (NOTE) If result is NEGATIVE SARS-CoV-2 target nucleic acids are NOT DETECTED. The SARS-CoV-2 RNA is generally detectable in upper and lower  respiratory specimens during the acute phase of infection. The lowest  concentration of SARS-CoV-2 viral copies this assay can detect is 250  copies / mL. A  negative result does not preclude SARS-CoV-2 infection  and should not be used as the sole basis for treatment or other  patient management decisions.  A negative result may occur with  improper specimen collection / handling, submission of specimen other  than nasopharyngeal swab, presence of viral mutation(s) within the  areas targeted by this assay, and inadequate number of viral copies  (<250 copies / mL). A negative result must be combined with clinical  observations, patient history, and epidemiological information. If result is POSITIVE SARS-CoV-2 target nucleic acids are DETECTED. The SARS-CoV-2 RNA is generally detectable in upper and lower  respiratory specimens dur ing the acute phase of infection.  Positive  results are indicative of active infection with SARS-CoV-2.  Clinical  correlation with patient history and other diagnostic information is  necessary to determine patient infection  status.  Positive results do  not rule out bacterial infection or co-infection with other viruses. If result is PRESUMPTIVE POSTIVE SARS-CoV-2 nucleic acids MAY BE PRESENT.   A presumptive positive result was obtained on the submitted specimen  and confirmed on repeat testing.  While 2019 novel coronavirus  (SARS-CoV-2) nucleic acids may be present in the submitted sample  additional confirmatory testing may be necessary for epidemiological  and / or clinical management purposes  to differentiate between  SARS-CoV-2 and other Sarbecovirus currently known to infect humans.  If clinically indicated additional testing with an alternate test  methodology [phone]) is advised. The SARS-CoV-2 RNA is generally  detectable in upper and lower respiratory sp ecimens during the acute  phase of infection. The expected result is Negative. Fact Sheet for Patients:  StrictlyIdeas.no Fact Sheet for Healthcare Providers: BankingDealers.co.za This test is not  yet approved or cleared by the [place] FDA and has been authorized for detection and/or diagnosis of SARS-CoV-2 by FDA under an Emergency Use Authorization (EUA).  This EUA will remain in effect (meaning this test can be used) for the duration of the COVID-19 declaration under Section 564(b)(1) of the Act, 21 U.S.C. section 360bbb-3(b)(1), unless the authorization is terminated or revoked sooner. Performed at [facility], [place] [address], [place], [place] [address]        Radiology Studies: No results found.    Scheduled Meds: . atorvastatin  40 mg Oral q1800  . lidocaine  1 patch Transdermal Q24H  . losartan  25 mg Oral Daily  . pantoprazole (PROTONIX) IV  40 mg Intravenous Q12H   Continuous Infusions: . sodium chloride 100 mL/hr at 04/07/19 0400     LOS: 2 days    Time spent: 25 minutes   Dessa Phi, DO [facility] [email or web address] 04/07/2019, 1:06 PM

## 2019-04-07 NOTE — TOC Progression Note (Signed)
Transition of Care [facility]) - Progression Note    Patient Details  Name: Connie Oconnor MRN: [id number] Date of Birth: 1941-06-13  Transition of Care [facility]) CM/SW [place], LCSW Phone Number: 04/07/2019, 12:59 PM  Clinical Narrative: CSW following patient for support and discharge needs. CSW spoke with patients admission coordinator Elyse Hsu. Elyse Hsu stated she has been trying to get a hold of patients insurance company  But has been unable to verify auth number. Patient will be unable to dc to facility without auth number.     Expected Discharge Plan: [place] Barriers to Discharge: Continued Medical Work up  Expected Discharge Plan and Services Expected Discharge Plan: [place] Choice: [place] arrangements for the past 2 months: Skilled Nursing Facility(SNF for rehab)                                       Social Determinants of Health (SDOH) Interventions    Readmission Risk Interventions Readmission Risk Prevention Plan 04/06/2019  Transportation Screening Complete  Home Care Screening Complete  Some recent data might be hidden

## 2019-04-08 LAB — CBC
HCT: 25.7 % — ABNORMAL LOW (ref 36.0–46.0)
Hemoglobin: 7.7 g/dL — ABNORMAL LOW (ref 12.0–15.0)
MCH: 25.4 pg — ABNORMAL LOW (ref 26.0–34.0)
MCHC: 30 g/dL (ref 30.0–36.0)
MCV: 84.8 fL (ref 80.0–100.0)
Platelets: 470 10*3/uL — ABNORMAL HIGH (ref 150–400)
RBC: 3.03 MIL/uL — ABNORMAL LOW (ref 3.87–5.11)
RDW: 15.5 % (ref 11.5–15.5)
WBC: 6.2 10*3/uL (ref 4.0–10.5)
nRBC: 0 % (ref 0.0–0.2)

## 2019-04-08 LAB — BASIC METABOLIC PANEL
Anion gap: 7 (ref 5–15)
BUN: <5 mg/dL — ABNORMAL LOW (ref 8–23)
CO2: 23 mmol/L (ref 22–32)
Calcium: 7.7 mg/dL — ABNORMAL LOW (ref 8.9–10.3)
Chloride: 104 mmol/L (ref 98–111)
Creatinine, Ser: 0.4 mg/dL — ABNORMAL LOW (ref 0.44–1.00)
GFR calc Af Amer: >60 mL/min (ref >60)
GFR calc non Af Amer: >60 mL/min (ref >60)
Glucose, Bld: 100 mg/dL — ABNORMAL HIGH (ref 70–99)
Potassium: 2.7 mmol/L — CL (ref 3.5–5.1)
Sodium: 134 mmol/L — ABNORMAL LOW (ref 135–145)

## 2019-04-08 MED ORDER — PANTOPRAZOLE SODIUM 40 MG PO TBEC
40.0000 mg | DELAYED_RELEASE_TABLET | Freq: Two times a day (BID) | ORAL | Status: DC
  Administered 2019-04-08 – 2019-04-09 (×3): 40 mg via ORAL
  Filled 2019-04-08 (×3): qty 1

## 2019-04-08 MED ORDER — CLOPIDOGREL BISULFATE 75 MG PO TABS
75.0000 mg | ORAL_TABLET | Freq: Every day | ORAL | Status: DC
  Administered 2019-04-08 – 2019-04-09 (×2): 75 mg via ORAL
  Filled 2019-04-08 (×2): qty 1

## 2019-04-08 MED ORDER — TRAMADOL HCL 50 MG PO TABS
50.0000 mg | ORAL_TABLET | Freq: Four times a day (QID) | ORAL | Status: DC | PRN
  Administered 2019-04-08 – 2019-04-09 (×3): 50 mg via ORAL
  Filled 2019-04-08 (×3): qty 1

## 2019-04-08 MED ORDER — POTASSIUM CHLORIDE CRYS ER 20 MEQ PO TBCR
40.0000 meq | EXTENDED_RELEASE_TABLET | ORAL | Status: AC
  Administered 2019-04-08 (×2): 40 meq via ORAL
  Filled 2019-04-08 (×2): qty 2

## 2019-04-08 NOTE — Progress Notes (Signed)
PROGRESS NOTE    Connie Oconnor  PYK:[id number] DOB: [birth year] DOA: 04/04/2019 PCP: Camille Bal, PA-C     Brief Narrative:  Connie Oconnor is a 78 y.o. female with medical history significant of right SAH from a fall in May 2018, hypothyroidism, tobacco abuse, back surgery. She was hospitalized in April 2020 due to acute left-sided weakness, MRI revealed thrombotic stroke. Patient remained on aspirin, plavix and sent to rehab. During that admission, she also had acute blood loss anemia with positive hemoccult. She underwent EGD showing nonbleeding gastric ulcer erosive gastropathy recommending PPI twice daily x8 weeks then daily recommended Carafate 1 g 4 times daily for 2 weeks.   EGD 03/20/2019 Dr. Tarri Glenn revealed: - LA Grade B reflux esophagitis. - Small hiatal hernia without Cameron's erosions. - Non-bleeding gastric ulcer with no stigmata of bleeding. - Non-bleeding erosive gastropathy. - Normal examined duodenum. - The examination was otherwise normal. - No specimens collected.  Colonoscopy 2009: normal exam at that time  Her main complaint is weakness and fatigue.  She denies any dizziness or lightheadedness, no chest pain, nausea, vomiting or abdominal pain.  She also admits to some mild shortness of breath without any cough.  No fevers.  New events last 24 hours / Subjective: Nothing new, having some RLE chronic pain   Assessment & Plan:   Principal Problem:   Acute blood loss anemia Active Problems:   Essential hypertension   GI bleed   HLD (hyperlipidemia)   Acute blood loss anemia -Previous EGD revealed esophagitis, nonbleeding gastric ulcer, nonbleeding erosive gastropathy -GI consulted, family declined EGD at this time  -PPI IV --> PO  -Carafate resumed  -Trend H&H -Discontinue supplemental iron  -Advance diet today for soft diet   Recent hx right lacunar infarct -Resume plavix today. Discussed with GI over the phone   HTN -Continue cozaar   HLD -Continue lipitor   Thrombocytosis -?Etiology -Possibly due to iron deficiency, but inflammatory markers are elevated. Check blood smear. Discussed with family to follow up for outpatient work up  -Improving  Hypokalemia -Replace, trend    DVT prophylaxis: SCD Code Status: DNR Family Communication: Spoke with daughter over the phone today  Disposition Plan: Awaiting SNF insurance auth, pending repeat Hgb, replace K    Consultants:   GI   Procedures:   None   Antimicrobials:  Anti-infectives (From admission, onward)   None       Objective: Vitals:   04/07/19 1057 04/07/19 1535 04/07/19 2214 04/08/19 0546  BP:  (!) 159/72 (!) 145/57 (!) 149/57  Pulse:  72 72 69  Resp:  (!) 21 20 18   Temp:  98.3 F (36.8 C) 97.9 F (36.6 C) (!) 97.5 F (36.4 C)  TempSrc:   Oral Oral  SpO2: 95% 99% 100% 96%  Weight:      Height:        Intake/Output Summary (Last 24 hours) at 04/08/2019 1120 Last data filed at 04/08/2019 0815 Gross per 24 hour  Intake 120 ml  Output -  Net 120 ml   Filed Weights   04/04/19 1249  Weight: 78.5 kg    Examination: General exam: Appears calm and comfortable  Respiratory system: Clear to auscultation. Respiratory effort normal. Cardiovascular system: S1 & S2 heard, RRR. No JVD, murmurs, rubs, gallops or clicks. No pedal edema. Gastrointestinal system: Abdomen is nondistended, soft and nontender. No organomegaly or masses felt. Normal bowel sounds heard. Central nervous system: Alert and oriented. No focal neurological deficits. Extremities: Symmetric 5  x 5 power. Skin: No rashes, lesions or ulcers Psychiatry: Judgement and insight appear stable    Data Reviewed: I have personally reviewed following labs and imaging studies  CBC: Recent Labs  Lab 04/04/19 1320 04/05/19 0102  04/05/19 1809 04/06/19 0028 04/06/19 0726 04/07/19 0624 04/08/19 0538  WBC 9.1 10.3  --   --   --   --  8.8 6.2  NEUTROABS 7.4  --   --   --   --   --   7.1  --   HGB 6.9* 8.2*   < > 8.4* 8.3* 8.1* 8.6* 7.7*  HCT 22.5* 26.4*   < > 27.2* 26.6* 26.3* 28.7* 25.7*  MCV 82.7 84.9  --   --   --   --  84.4 84.8  PLT 665* 699*  --   --   --   --  555* 470*   < > = values in this interval not displayed.   Basic Metabolic Panel: Recent Labs  Lab 04/04/19 1320 04/05/19 0102 04/08/19 0538  NA 133* 134* 134*  K 3.7 3.9 2.7*  CL 98 103 104  CO2 25 22 23   GLUCOSE 98 94 100*  BUN 11 11 <5*  CREATININE 0.49 0.52 0.40*  CALCIUM 8.0* 7.7* 7.7*   GFR: Estimated Creatinine Clearance: 61 mL/min (A) (by C-G formula based on SCr of 0.4 mg/dL (L)). Liver Function Tests: No results for input(s): AST, ALT, ALKPHOS, BILITOT, PROT, ALBUMIN in the last 168 hours. No results for input(s): LIPASE, AMYLASE in the last 168 hours. No results for input(s): AMMONIA in the last 168 hours. Coagulation Profile: No results for input(s): INR, PROTIME in the last 168 hours. Cardiac Enzymes: No results for input(s): CKTOTAL, CKMB, CKMBINDEX, TROPONINI in the last 168 hours. BNP (last 3 results) No results for input(s): PROBNP in the last 8760 hours. HbA1C: No results for input(s): HGBA1C in the last 72 hours. CBG: No results for input(s): GLUCAP in the last 168 hours. Lipid Profile: No results for input(s): CHOL, HDL, LDLCALC, TRIG, CHOLHDL, LDLDIRECT in the last 72 hours. Thyroid Function Tests: No results for input(s): TSH, T4TOTAL, FREET4, T3FREE, THYROIDAB in the last 72 hours. Anemia Panel: Recent Labs    04/06/19 1435  FERRITIN 204  TIBC 185*  IRON 11*   Sepsis Labs: No results for input(s): PROCALCITON, LATICACIDVEN in the last 168 hours.  Recent Results (from the past 240 hour(s))  SARS Coronavirus 2 (CEPHEID - Performed in [facility] hospital lab), Hosp Order     Status: None   Collection Time: 04/04/19  2:28 PM  Result Value Ref Range Status   SARS Coronavirus 2 NEGATIVE NEGATIVE Final    Comment: (NOTE) If result is NEGATIVE SARS-CoV-2  target nucleic acids are NOT DETECTED. The SARS-CoV-2 RNA is generally detectable in upper and lower  respiratory specimens during the acute phase of infection. The lowest  concentration of SARS-CoV-2 viral copies this assay can detect is 250  copies / mL. A negative result does not preclude SARS-CoV-2 infection  and should not be used as the sole basis for treatment or other  patient management decisions.  A negative result may occur with  improper specimen collection / handling, submission of specimen other  than nasopharyngeal swab, presence of viral mutation(s) within the  areas targeted by this assay, and inadequate number of viral copies  (<250 copies / mL). A negative result must be combined with clinical  observations, patient history, and epidemiological information. If result is  POSITIVE SARS-CoV-2 target nucleic acids are DETECTED. The SARS-CoV-2 RNA is generally detectable in upper and lower  respiratory specimens dur ing the acute phase of infection.  Positive  results are indicative of active infection with SARS-CoV-2.  Clinical  correlation with patient history and other diagnostic information is  necessary to determine patient infection status.  Positive results do  not rule out bacterial infection or co-infection with other viruses. If result is PRESUMPTIVE POSTIVE SARS-CoV-2 nucleic acids MAY BE PRESENT.   A presumptive positive result was obtained on the submitted specimen  and confirmed on repeat testing.  While 2019 novel coronavirus  (SARS-CoV-2) nucleic acids may be present in the submitted sample  additional confirmatory testing may be necessary for epidemiological  and / or clinical management purposes  to differentiate between  SARS-CoV-2 and other Sarbecovirus currently known to infect humans.  If clinically indicated additional testing with an alternate test  methodology [phone]([id number]) is advised. The SARS-CoV-2 RNA is generally  detectable in upper and lower  respiratory sp ecimens during the acute  phase of infection. The expected result is Negative. Fact Sheet for Patients:  BoilerBrush.com.cyhttps://www.fda.gov/media/136312/download Fact Sheet for Healthcare Providers: https://pope.com/https://www.fda.gov/media/136313/download This test is not yet approved or cleared by the [place] FDA and has been authorized for detection and/or diagnosis of SARS-CoV-2 by FDA under an Emergency Use Authorization (EUA).  This EUA will remain in effect (meaning this test can be used) for the duration of the COVID-19 declaration under Section 564(b)(1) of the Act, 21 U.S.C. section 360bbb-3(b)(1), unless the authorization is terminated or revoked sooner. Performed at [facility], [address] [place], [place] [address]        Radiology Studies: No results found.    Scheduled Meds: . atorvastatin  40 mg Oral q1800  . clopidogrel  75 mg Oral Daily  . lidocaine  1 patch Transdermal Q24H  . losartan  25 mg Oral Daily  . pantoprazole  40 mg Oral BID AC  . potassium chloride  40 mEq Oral Q4H  . sucralfate  1 g Oral QID   Continuous Infusions:    LOS: 3 days    Time spent: 25 minutes   Noralee StainJennifer Tobias Avitabile, DO [facility] [email or web address] 04/08/2019, 11:20 AM

## 2019-04-08 NOTE — Progress Notes (Signed)
R. Holmes from lab called to report a critical lab of K+- 2.7. Text paged Dr. Maylene Roes and awaiting response.

## 2019-04-08 NOTE — Progress Notes (Signed)
New order received for report of low potassium. Please see order.

## 2019-04-09 LAB — CBC
HCT: 25.7 % — ABNORMAL LOW (ref 36.0–46.0)
Hemoglobin: 7.5 g/dL — ABNORMAL LOW (ref 12.0–15.0)
MCH: 25 pg — ABNORMAL LOW (ref 26.0–34.0)
MCHC: 29.2 g/dL — ABNORMAL LOW (ref 30.0–36.0)
MCV: 85.7 fL (ref 80.0–100.0)
Platelets: 443 10*3/uL — ABNORMAL HIGH (ref 150–400)
RBC: 3 MIL/uL — ABNORMAL LOW (ref 3.87–5.11)
RDW: 15.7 % — ABNORMAL HIGH (ref 11.5–15.5)
WBC: 6.6 10*3/uL (ref 4.0–10.5)
nRBC: 0 % (ref 0.0–0.2)

## 2019-04-09 LAB — BASIC METABOLIC PANEL
Anion gap: 7 (ref 5–15)
BUN: 5 mg/dL — ABNORMAL LOW (ref 8–23)
CO2: 24 mmol/L (ref 22–32)
Calcium: 7.7 mg/dL — ABNORMAL LOW (ref 8.9–10.3)
Chloride: 101 mmol/L (ref 98–111)
Creatinine, Ser: 0.34 mg/dL — ABNORMAL LOW (ref 0.44–1.00)
GFR calc Af Amer: >60 mL/min (ref >60)
GFR calc non Af Amer: >60 mL/min (ref >60)
Glucose, Bld: 99 mg/dL (ref 70–99)
Potassium: 3.8 mmol/L (ref 3.5–5.1)
Sodium: 132 mmol/L — ABNORMAL LOW (ref 135–145)

## 2019-04-09 NOTE — Plan of Care (Signed)
No respiratory distress. Vital signs stable.

## 2019-04-09 NOTE — TOC Progression Note (Addendum)
Transition of Care [facility]) - Progression Note    Patient Details  Name: Connie Oconnor MRN: [id number] Date of Birth: 1941-02-28  [facility]) CM/SW Contact  Leeroy Cha, RN Phone Number: 04/09/2019, 11:02 AM  Clinical Narrative:    Patient is awaiting a bed at [facility]. [facility]/insurance Josem Kaufmann. Has been obtained. Text message to Dr. Maylene Roes of the above.  Expected Discharge Plan: [place] Barriers to Discharge: Continued Medical Work up  Expected Discharge Plan and Services Expected Discharge Plan: [place] Choice: [place] arrangements for the past 2 months: Skilled Nursing Facility(SNF for rehab)                                       Social Determinants of Health (SDOH) Interventions    Readmission Risk Interventions Readmission Risk Prevention Plan 04/06/2019  Transportation Screening Complete  Home Care Screening Complete  Some recent data might be hidden

## 2019-04-09 NOTE — Discharge Summary (Addendum)
Physician Discharge Summary  Nancy MarusRebecca Cocke WJX:[id number] DOB: [birth year] DOA: 04/04/2019  PCP: Heide ScalesNelson, Kristen M, PA-C  Admit date: 04/04/2019 Discharge date: 04/09/2019  Admitted From: SNF Disposition:  SNF  Recommendations for Outpatient Follow-up:  1. Follow up with PCP in 1 week 2. Follow up with GI in Dr. Orvan FalconerBeavers  3. Please obtain CBC in 1 week, then every 2 weeks x 2. Hgb 7.5 on day of discharge.   Discharge Condition: Stable CODE STATUS: DNR   Diet recommendation: Heart healthy   Brief/Interim Summary: Connie GallopRebecca Russellis a 78 y.o.femalewith medical history significant ofright SAH from a fall in May 2018, hypothyroidism, tobacco abuse, back surgery.She was hospitalized in April 2020 due to acute left-sided weakness, MRI revealed thrombotic stroke. Patient remained on aspirin, plavix and sent to rehab. During that admission, she also had acute blood loss anemia with positive hemoccult. She underwent EGDshowing nonbleeding gastric ulcer erosive gastropathy recommending PPI twice daily x8 weeks then daily recommended Carafate 1 g 4 times daily for 2 weeks.  EGD 03/20/2019 Dr. Orvan FalconerBeavers revealed: - LA Grade B reflux esophagitis. - Small hiatal hernia without Cameron's erosions. - Non-bleeding gastric ulcer with no stigmata of bleeding. - Non-bleeding erosive gastropathy. - Normal examined duodenum. - The examination was otherwise normal. - No specimens collected.  Colonoscopy 2009: normal exam at that time  Her main complaint is weakness and fatigue. She denies any dizziness or lightheadedness, no chest pain, nausea, vomiting or abdominal pain. She also admits to some mild shortness of breath without any cough. No fevers.  Patient was admitted for concern for GI bleed.  GI was consulted, family declined for repeat endoscopy and elected to monitor patient.  Plavix was resumed.  GI recommends continued evaluation of her hemoglobin as an outpatient.  Discharge Diagnoses:   Principal Problem:   Acute blood loss anemia Active Problems:   Essential hypertension   GI bleed   HLD (hyperlipidemia)   Acute blood loss anemia -Previous EGDrevealed esophagitis, nonbleeding gastric ulcer, nonbleeding erosive gastropathy -GI consulted, family declined EGD at this time  -PPIIV--> PO  -Carafate resumed  -Discontinue supplemental iron  -Trend CBC   Recent hx right lacunar infarct -Resume plavix 6/7. Stop aspirin. Discussed with GI over the phone   HTN -Continue cozaar  HLD -Continue lipitor   Thrombocytosis -?Etiology -Possibly due to iron deficiency, but inflammatory markers are elevated. Check blood smear. Discussed with family to follow up for outpatient work up  -Improving   Discharge Instructions  Discharge Instructions    Diet - low sodium heart healthy   Complete by:  As directed    Increase activity slowly   Complete by:  As directed      Allergies as of 04/09/2019      Reactions   Lactose Intolerance (gi) Diarrhea   Ensure causing diarrhea.   Aspirin Other (See Comments)   shaking   Ibuprofen Other (See Comments)   Shaking   Penicillins Rash, Other (See Comments)   Has patient had a PCN reaction causing immediate rash, facial/tongue/throat swelling, SOB or lightheadedness with hypotension: No Has patient had a PCN reaction causing severe rash involving mucus membranes or skin necrosis: No Has patient had a PCN reaction that required hospitalization: Yes - was in hospital Has patient had a PCN reaction occurring within the last 10 years: No If all of the above answers are "NO", then may proceed with Cephalosporin use.      Medication List    STOP taking these medications  aspirin 81 MG EC tablet     TAKE these medications   acetaminophen 325 MG tablet Commonly known as:  TYLENOL Take 2 tablets (650 mg total) by mouth every 4 (four) hours as needed for mild pain (or temp > 37.5 C (99.5 F)).   atorvastatin 40 MG  tablet Commonly known as:  LIPITOR Take 1 tablet (40 mg total) by mouth daily at 6 PM.   clopidogrel 75 MG tablet Commonly known as:  PLAVIX Take 1 tablet (75 mg total) by mouth daily.   diclofenac sodium 1 % Gel Commonly known as:  VOLTAREN Apply 2 g topically 4 (four) times daily.   docusate sodium 100 MG capsule Commonly known as:  COLACE Take 100 mg by mouth daily.   gabapentin 100 MG capsule Commonly known as:  NEURONTIN Take 100 mg by mouth 3 (three) times daily.   losartan 25 MG tablet Commonly known as:  COZAAR Take 1 tablet (25 mg total) by mouth daily.   magnesium oxide 400 MG tablet Commonly known as:  MAG-OX Take 400 mg by mouth daily.   Melatonin 3 MG Tabs Take 6 mg by mouth at bedtime.   multivitamin with minerals Tabs tablet Take 1 tablet by mouth daily.   nicotine 14 mg/24hr patch Commonly known as:  NICODERM CQ - dosed in mg/24 hours Place 1 patch (14 mg total) onto the skin daily.   pantoprazole 40 MG tablet Commonly known as:  PROTONIX Take 1 tablet (40 mg total) by mouth 2 (two) times daily before a meal.   polyethylene glycol 17 g packet Commonly known as:  MIRALAX / GLYCOLAX Take 17 g by mouth 2 (two) times daily.   senna-docusate 8.6-50 MG tablet Commonly known as:  Senokot-S Take 2 tablets by mouth at bedtime.   sucralfate 1 g tablet Commonly known as:  CARAFATE Take 1 g by mouth 4 (four) times daily.      Follow-up Information    Tressia DanasBeavers, Kimberly, MD. Schedule an appointment as soon as possible for a visit.   Specialty:  Gastroenterology Contact information: [address] [place] [address] [phone]        Heide ScalesNelson, Kristen M, PA-C. Schedule an appointment as soon as possible for a visit.   Specialty:  Physician Assistant Contact information: [address] [place] [address] [phone]          Allergies  Allergen Reactions  . Lactose Intolerance (Gi) Diarrhea    Ensure causing diarrhea.  . Aspirin  Other (See Comments)    shaking  . Ibuprofen Other (See Comments)    Shaking  . Penicillins Rash and Other (See Comments)    Has patient had a PCN reaction causing immediate rash, facial/tongue/throat swelling, SOB or lightheadedness with hypotension: No Has patient had a PCN reaction causing severe rash involving mucus membranes or skin necrosis: No Has patient had a PCN reaction that required hospitalization: Yes - was in hospital Has patient had a PCN reaction occurring within the last 10 years: No If all of the above answers are "NO", then may proceed with Cephalosporin use.     Consultations:  GI   Procedures/Studies: None    Discharge Exam: Vitals:   04/08/19 2128 04/09/19 0551  BP: (!) 153/67 (!) 150/69  Pulse: 75 71  Resp: 20 20  Temp: 98.4 F (36.9 C) 97.9 F (36.6 C)  SpO2: 93% 96%    General: Pt is alert, awake, not in acute distress Cardiovascular: RRR,  S1/S2 +, no rubs, no gallops Respiratory: CTA bilaterally, no wheezing, no rhonchi Abdominal: Soft, NT, ND, bowel sounds + Extremities: no edema, no cyanosis    The results of significant diagnostics from this hospitalization (including imaging, microbiology, ancillary and laboratory) are listed below for reference.     Microbiology: Recent Results (from the past 240 hour(s))  SARS Coronavirus 2 (CEPHEID - Performed in [facility] hospital lab), Hosp Order     Status: None   Collection Time: 04/04/19  2:28 PM  Result Value Ref Range Status   SARS Coronavirus 2 NEGATIVE NEGATIVE Final    Comment: (NOTE) If result is NEGATIVE SARS-CoV-2 target nucleic acids are NOT DETECTED. The SARS-CoV-2 RNA is generally detectable in upper and lower  respiratory specimens during the acute phase of infection. The lowest  concentration of SARS-CoV-2 viral copies this assay can detect is 250  copies / mL. A negative result does not preclude SARS-CoV-2 infection  and should not be used as the sole basis for treatment  or other  patient management decisions.  A negative result may occur with  improper specimen collection / handling, submission of specimen other  than nasopharyngeal swab, presence of viral mutation(s) within the  areas targeted by this assay, and inadequate number of viral copies  (<250 copies / mL). A negative result must be combined with clinical  observations, patient history, and epidemiological information. If result is POSITIVE SARS-CoV-2 target nucleic acids are DETECTED. The SARS-CoV-2 RNA is generally detectable in upper and lower  respiratory specimens dur ing the acute phase of infection.  Positive  results are indicative of active infection with SARS-CoV-2.  Clinical  correlation with patient history and other diagnostic information is  necessary to determine patient infection status.  Positive results do  not rule out bacterial infection or co-infection with other viruses. If result is PRESUMPTIVE POSTIVE SARS-CoV-2 nucleic acids MAY BE PRESENT.   A presumptive positive result was obtained on the submitted specimen  and confirmed on repeat testing.  While 2019 novel coronavirus  (SARS-CoV-2) nucleic acids may be present in the submitted sample  additional confirmatory testing may be necessary for epidemiological  and / or clinical management purposes  to differentiate between  SARS-CoV-2 and other Sarbecovirus currently known to infect humans.  If clinically indicated additional testing with an alternate test  methodology [phone]) is advised. The SARS-CoV-2 RNA is generally  detectable in upper and lower respiratory sp ecimens during the acute  phase of infection. The expected result is Negative. Fact Sheet for Patients:  BoilerBrush.com.cy Fact Sheet for Healthcare Providers: https://pope.com/ This test is not yet approved or cleared by the [place] FDA and has been authorized for detection and/or diagnosis of  SARS-CoV-2 by FDA under an Emergency Use Authorization (EUA).  This EUA will remain in effect (meaning this test can be used) for the duration of the COVID-19 declaration under Section 564(b)(1) of the Act, 21 U.S.C. section 360bbb-3(b)(1), unless the authorization is terminated or revoked sooner. Performed at [facility], [address] [place], [place] [address]      Labs: BNP (last 3 results) No results for input(s): BNP in the last 8760 hours. Basic Metabolic Panel: Recent Labs  Lab 04/04/19 1320 04/05/19 0102 04/08/19 0538 04/09/19 0532  NA 133* 134* 134* 132*  K 3.7 3.9 2.7* 3.8  CL 98 103 104 101  CO2 GLUCOSE 98 94 100* 99  BUN 11 11 <5* 5*  CREATININE 0.49  0.52 0.40* 0.34*  CALCIUM 8.0* 7.7* 7.7* 7.7*   Liver Function Tests: No results for input(s): AST, ALT, ALKPHOS, BILITOT, PROT, ALBUMIN in the last 168 hours. No results for input(s): LIPASE, AMYLASE in the last 168 hours. No results for input(s): AMMONIA in the last 168 hours. CBC: Recent Labs  Lab 04/04/19 1320 04/05/19 0102  04/06/19 0028 04/06/19 0726 04/07/19 0624 04/08/19 0538 04/09/19 0532  WBC 9.1 10.3  --   --   --  8.8 6.2 6.6  NEUTROABS 7.4  --   --   --   --  7.1  --   --   HGB 6.9* 8.2*   < > 8.3* 8.1* 8.6* 7.7* 7.5*  HCT 22.5* 26.4*   < > 26.6* 26.3* 28.7* 25.7* 25.7*  MCV 82.7 84.9  --   --   --  84.4 84.8 85.7  PLT 665* 699*  --   --   --  555* 470* 443*   < > = values in this interval not displayed.   Cardiac Enzymes: No results for input(s): CKTOTAL, CKMB, CKMBINDEX, TROPONINI in the last 168 hours. BNP: Invalid input(s): POCBNP CBG: No results for input(s): GLUCAP in the last 168 hours. D-Dimer No results for input(s): DDIMER in the last 72 hours. Hgb A1c No results for input(s): HGBA1C in the last 72 hours. Lipid Profile No results for input(s): CHOL, HDL, LDLCALC, TRIG, CHOLHDL, LDLDIRECT in the last 72 hours. Thyroid function studies No  results for input(s): TSH, T4TOTAL, T3FREE, THYROIDAB in the last 72 hours.  Invalid input(s): FREET3 Anemia work up Recent Labs    04/06/19 1435  FERRITIN 204  TIBC 185*  IRON 11*   Urinalysis    Component Value Date/Time   COLORURINE YELLOW 03/13/2019 1545   APPEARANCEUR CLOUDY (A) 03/13/2019 1545   LABSPEC 1.014 03/13/2019 1545   PHURINE 7.0 03/13/2019 1545   GLUCOSEU NEGATIVE 03/13/2019 1545   [place] NEGATIVE 03/13/2019 1545   [place] 03/13/2019 1545   [place] 03/13/2019 1545   PROTEINUR NEGATIVE 03/13/2019 1545   NITRITE NEGATIVE 03/13/2019 1545   LEUKOCYTESUR NEGATIVE 03/13/2019 1545   Sepsis Labs Invalid input(s): PROCALCITONIN,  WBC,  LACTICIDVEN Microbiology Recent Results (from the past 240 hour(s))  SARS Coronavirus 2 (CEPHEID - Performed in [place] hospital lab), Hosp Order     Status: None   Collection Time: 04/04/19  2:28 PM  Result Value Ref Range Status   SARS Coronavirus 2 NEGATIVE NEGATIVE Final    Comment: (NOTE) If result is NEGATIVE SARS-CoV-2 target nucleic acids are NOT DETECTED. The SARS-CoV-2 RNA is generally detectable in upper and lower  respiratory specimens during the acute phase of infection. The lowest  concentration of SARS-CoV-2 viral copies this assay can detect is 250  copies / mL. A negative result does not preclude SARS-CoV-2 infection  and should not be used as the sole basis for treatment or other  patient management decisions.  A negative result may occur with  improper specimen collection / handling, submission of specimen other  than nasopharyngeal swab, presence of viral mutation(s) within the  areas targeted by this assay, and inadequate number of viral copies  (<250 copies / mL). A negative result must be combined with clinical  observations, patient history, and epidemiological information. If result is POSITIVE SARS-CoV-2 target nucleic acids are DETECTED. The SARS-CoV-2 RNA is generally  detectable in upper and lower  respiratory specimens dur ing the acute phase of infection.  Positive  results are  indicative of active infection with SARS-CoV-2.  Clinical  correlation with patient history and other diagnostic information is  necessary to determine patient infection status.  Positive results do  not rule out bacterial infection or co-infection with other viruses. If result is PRESUMPTIVE POSTIVE SARS-CoV-2 nucleic acids MAY BE PRESENT.   A presumptive positive result was obtained on the submitted specimen  and confirmed on repeat testing.  While 2019 novel coronavirus  (SARS-CoV-2) nucleic acids may be present in the submitted sample  additional confirmatory testing may be necessary for epidemiological  and / or clinical management purposes  to differentiate between  SARS-CoV-2 and other Sarbecovirus currently known to infect humans.  If clinically indicated additional testing with an alternate test  methodology [phone]([id number]) is advised. The SARS-CoV-2 RNA is generally  detectable in upper and lower respiratory sp ecimens during the acute  phase of infection. The expected result is Negative. Fact Sheet for Patients:  BoilerBrush.com.cyhttps://www.fda.gov/media/136312/download Fact Sheet for Healthcare Providers: https://pope.com/https://www.fda.gov/media/136313/download This test is not yet approved or cleared by the [place] FDA and has been authorized for detection and/or diagnosis of SARS-CoV-2 by FDA under an Emergency Use Authorization (EUA).  This EUA will remain in effect (meaning this test can be used) for the duration of the COVID-19 declaration under Section 564(b)(1) of the Act, 21 U.S.C. section 360bbb-3(b)(1), unless the authorization is terminated or revoked sooner. Performed at [facility], [address] [place], [place] [address]      Patient was seen and examined on the day of discharge and was found to be in stable condition. Time coordinating discharge: 35  minutes including assessment and coordination of care, as well as examination of the patient.   SIGNED:  Noralee StainJennifer Quandre Polinski, DO [facility] [email or web address] 04/09/2019, 11:15 AM

## 2019-04-09 NOTE — Progress Notes (Signed)
Patient discharged to [facility] and transferred via [place] on stretcher. No acute episode. Detailed report called to Earleen Newport RN with questions answered. IV discontinued prior to discharge and dressing remained dry and intact. Patient belongings sent to facility with patient.

## 2019-04-09 NOTE — Care Management Important Message (Signed)
Important Message  Patient Details  IM Letter given to Velva Harman RN to present to the Patient                         Name: Connie Oconnor MRN: [id number] Date of Birth: [birth year]   Medicare Important Message Given:  Yes    Kerin Salen 04/09/2019, 10:38 AM

## 2019-04-09 NOTE — Plan of Care (Signed)
Patient preparing for discharge to SNF.

## 2019-04-10 NOTE — Telephone Encounter (Signed)
I called Juliann Pulse at facility that visit time will be change to Dr. Leonie Man due to Janett Billow NP not being in office in the am. I gave new appt of 1000am. I resent email for Dr.SEthi. Juliann Pulse will let nurse of new link being sent to email listed in previous message.Marland KitchenShe will call if pt gets discharge before appt.

## 2019-04-12 ENCOUNTER — Telehealth (HOSPITAL_COMMUNITY): Payer: Self-pay

## 2019-04-12 NOTE — Telephone Encounter (Signed)
Called to reschedule consult, no answer, left vm. AW  

## 2019-04-13 ENCOUNTER — Ambulatory Visit (HOSPITAL_COMMUNITY): Payer: Medicare Other

## 2019-04-24 ENCOUNTER — Emergency Department (EMERGENCY_DEPARTMENT_HOSPITAL)
Admission: EM | Admit: 2019-04-24 | Discharge: 2019-04-24 | Disposition: A | Discharge status: Home / Self Care | Payer: Medicare Other | Source: Home / Self Care | Attending: Emergency Medicine | Admitting: Emergency Medicine

## 2019-04-24 ENCOUNTER — Emergency Department (HOSPITAL_COMMUNITY): Payer: Medicare Other

## 2019-04-24 ENCOUNTER — Other Ambulatory Visit: Payer: Self-pay

## 2019-04-24 ENCOUNTER — Encounter (HOSPITAL_COMMUNITY): Payer: Self-pay

## 2019-04-24 ENCOUNTER — Ambulatory Visit (INDEPENDENT_AMBULATORY_CARE_PROVIDER_SITE_OTHER): Payer: Medicare Other | Admitting: Gastroenterology

## 2019-04-24 ENCOUNTER — Encounter: Payer: Self-pay | Admitting: Gastroenterology

## 2019-04-24 DIAGNOSIS — Z8673 Personal history of transient ischemic attack (TIA), and cerebral infarction without residual deficits: Secondary | ICD-10-CM | POA: Insufficient documentation

## 2019-04-24 DIAGNOSIS — Z7901 Long term (current) use of anticoagulants: Secondary | ICD-10-CM | POA: Insufficient documentation

## 2019-04-24 DIAGNOSIS — R0789 Other chest pain: Secondary | ICD-10-CM

## 2019-04-24 DIAGNOSIS — F17210 Nicotine dependence, cigarettes, uncomplicated: Secondary | ICD-10-CM | POA: Insufficient documentation

## 2019-04-24 DIAGNOSIS — K922 Gastrointestinal hemorrhage, unspecified: Secondary | ICD-10-CM

## 2019-04-24 DIAGNOSIS — E039 Hypothyroidism, unspecified: Secondary | ICD-10-CM | POA: Insufficient documentation

## 2019-04-24 DIAGNOSIS — R9082 White matter disease, unspecified: Secondary | ICD-10-CM | POA: Insufficient documentation

## 2019-04-24 DIAGNOSIS — Z20828 Contact with and (suspected) exposure to other viral communicable diseases: Secondary | ICD-10-CM | POA: Insufficient documentation

## 2019-04-24 DIAGNOSIS — I639 Cerebral infarction, unspecified: Secondary | ICD-10-CM | POA: Diagnosis not present

## 2019-04-24 LAB — CBC WITH DIFFERENTIAL/PLATELET
Abs Immature Granulocytes: 0.03 10*3/uL (ref 0.00–0.07)
Basophils Absolute: 0.1 10*3/uL (ref 0.0–0.1)
Basophils Relative: 1 %
Eosinophils Absolute: 0 10*3/uL (ref 0.0–0.5)
Eosinophils Relative: 0 %
HCT: 28.4 % — ABNORMAL LOW (ref 36.0–46.0)
Hemoglobin: 8.4 g/dL — ABNORMAL LOW (ref 12.0–15.0)
Immature Granulocytes: 1 %
Lymphocytes Relative: 12 %
Lymphs Abs: 0.7 10*3/uL (ref 0.7–4.0)
MCH: 24.4 pg — ABNORMAL LOW (ref 26.0–34.0)
MCHC: 29.6 g/dL — ABNORMAL LOW (ref 30.0–36.0)
MCV: 82.6 fL (ref 80.0–100.0)
Monocytes Absolute: 0.5 10*3/uL (ref 0.1–1.0)
Monocytes Relative: 8 %
Neutro Abs: 4.5 10*3/uL (ref 1.7–7.7)
Neutrophils Relative %: 78 %
Platelets: 607 10*3/uL — ABNORMAL HIGH (ref 150–400)
RBC: 3.44 MIL/uL — ABNORMAL LOW (ref 3.87–5.11)
RDW: 17.4 % — ABNORMAL HIGH (ref 11.5–15.5)
WBC: 5.7 10*3/uL (ref 4.0–10.5)
nRBC: 0 % (ref 0.0–0.2)

## 2019-04-24 LAB — BASIC METABOLIC PANEL
Anion gap: 9 (ref 5–15)
BUN: 5 mg/dL — ABNORMAL LOW (ref 8–23)
CO2: 26 mmol/L (ref 22–32)
Calcium: 7.9 mg/dL — ABNORMAL LOW (ref 8.9–10.3)
Chloride: 100 mmol/L (ref 98–111)
Creatinine, Ser: 0.48 mg/dL (ref 0.44–1.00)
GFR calc Af Amer: >60 mL/min (ref >60)
GFR calc non Af Amer: >60 mL/min (ref >60)
Glucose, Bld: 103 mg/dL — ABNORMAL HIGH (ref 70–99)
Potassium: 3.3 mmol/L — ABNORMAL LOW (ref 3.5–5.1)
Sodium: 135 mmol/L (ref 135–145)

## 2019-04-24 LAB — TROPONIN I (HIGH SENSITIVITY)
Troponin I (High Sensitivity): 6 ng/L (ref <18)
Troponin I (High Sensitivity): 6 ng/L (ref <18)

## 2019-04-24 LAB — SARS CORONAVIRUS 2 BY RT PCR (HOSPITAL ORDER, PERFORMED IN ~~LOC~~ HOSPITAL LAB): SARS Coronavirus 2: NEGATIVE

## 2019-04-24 IMAGING — CT CT HEAD WITHOUT CONTRAST
3 series · 15 of 47 positions shown, 18 images · non-contrast
Comparison: CT [DATE]

CLINICAL DATA: Ct head wo, complaint of sudden onset chest
discomfort slurred speech and slowed mentation. This lasted for a
short period of time and then resolved. Patient does not remember
anything that happened. She was sent to rule out a.*comment was
truncated*Altered level of consciousness (LOC), unexplained

EXAM:
CT HEAD WITHOUT CONTRAST
TECHNIQUE: Contiguous axial images were obtained from the base of the skull
through the vertex without intravenous contrast.

[Series 2: head 5.0 h30s · axial · 0.43mm/px · z∈[-124,+16]mm · 9 of 34 slices shown, 12 images]
[im 3/34  brain]
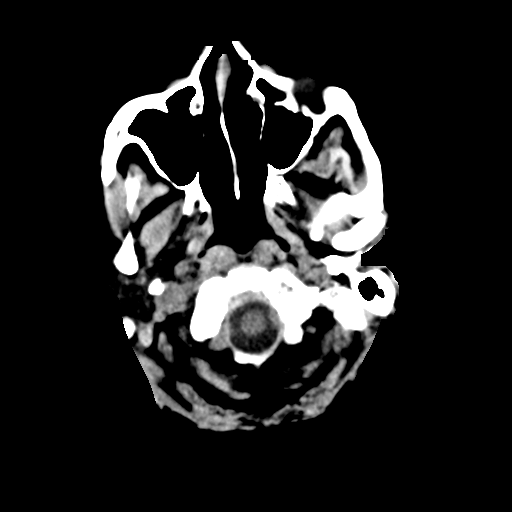
[im 3/34  bone]
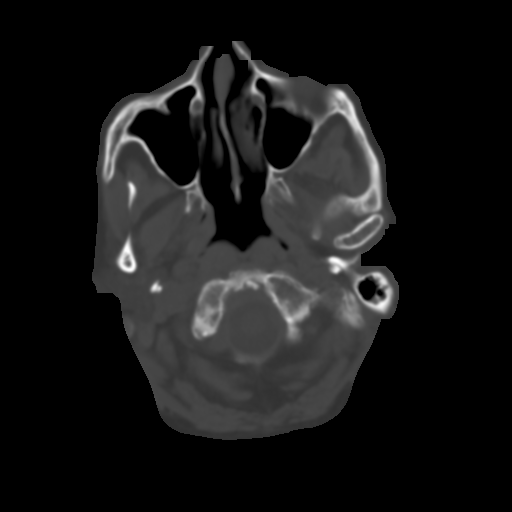
[im 6/34  brain]
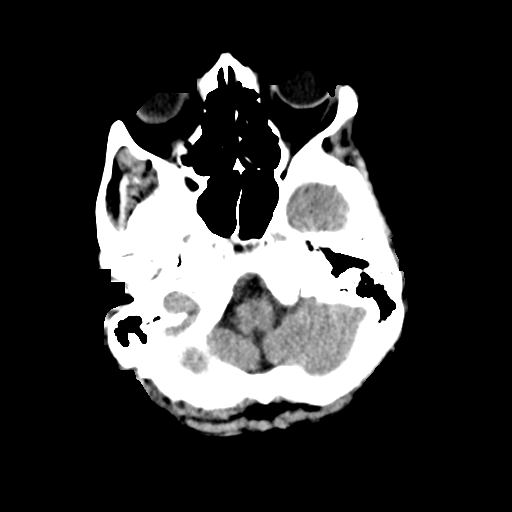
[im 10/34  brain]
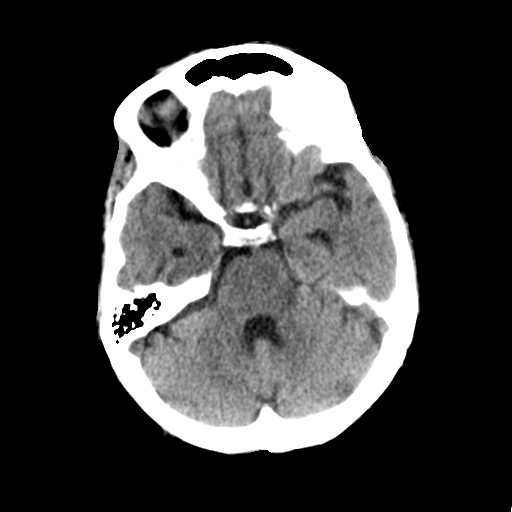
[im 13/34  brain]
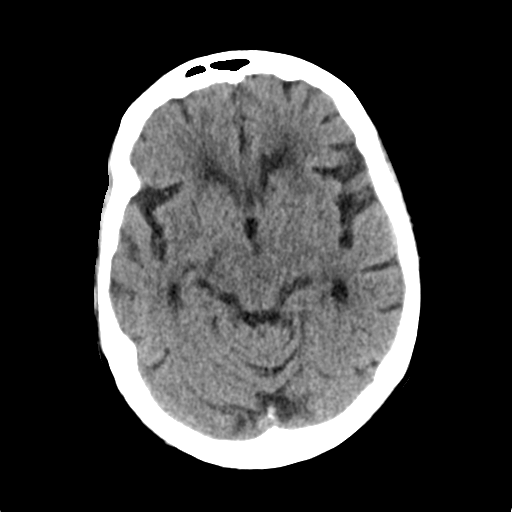
[im 18/34  brain]
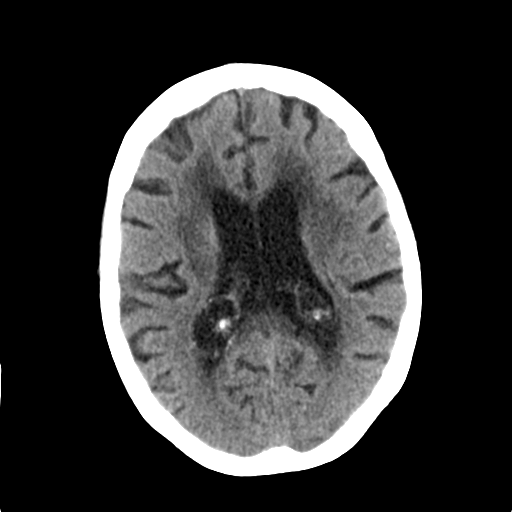
[im 18/34  bone]
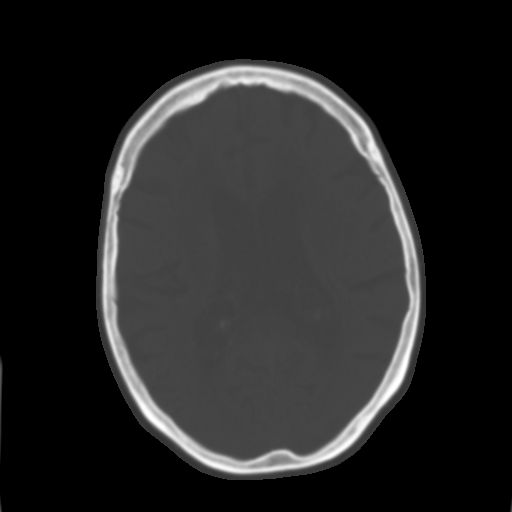
[im 21/34  brain]
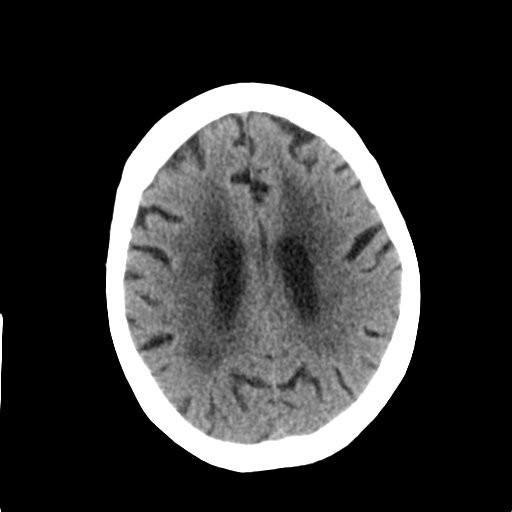
[im 24/34  brain]
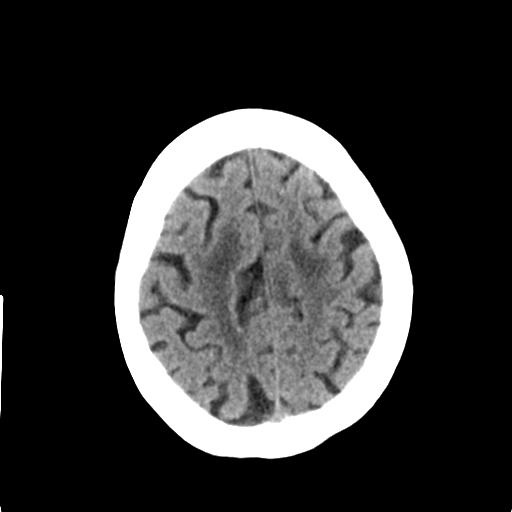
[im 28/34  brain]
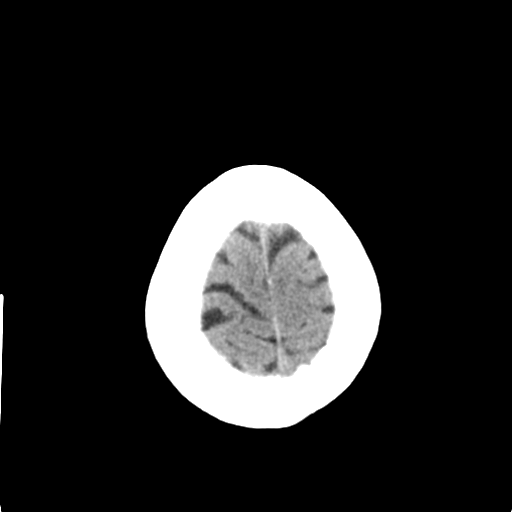
[im 31/34  brain]
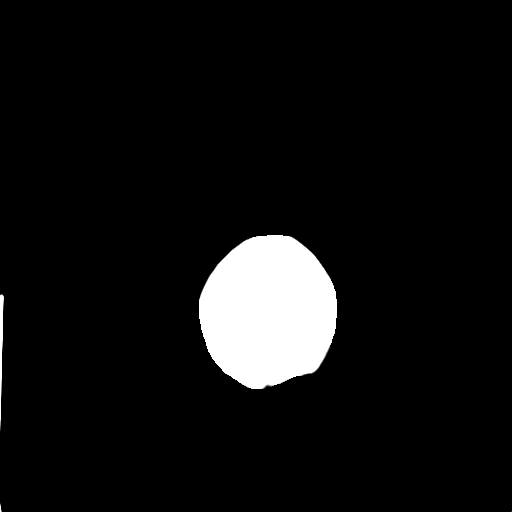
[im 31/34  bone]
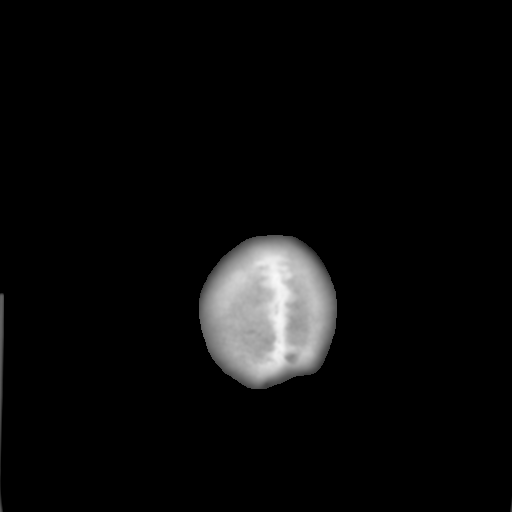

[Series 4: head 3.0 mpr cor · coronal · 0.33mm/px · 3 of 69 slices shown]
[im 23/69  brain]
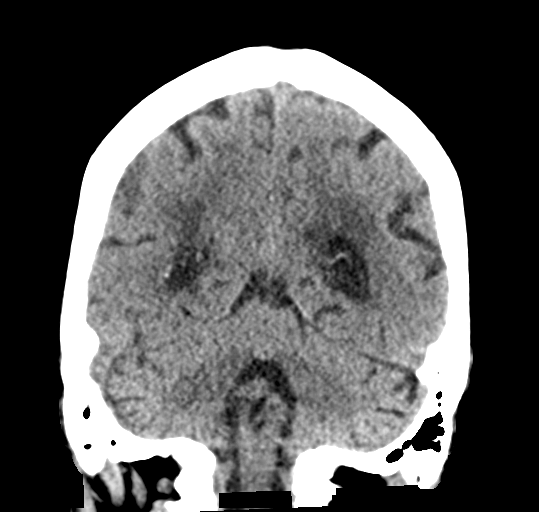
[im 31/69  brain]
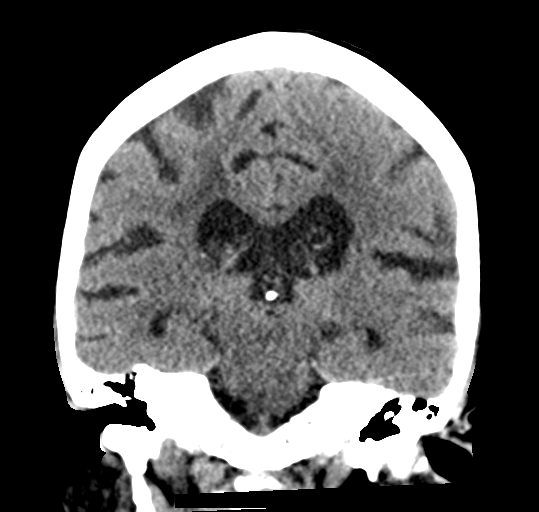
[im 38/69  brain]
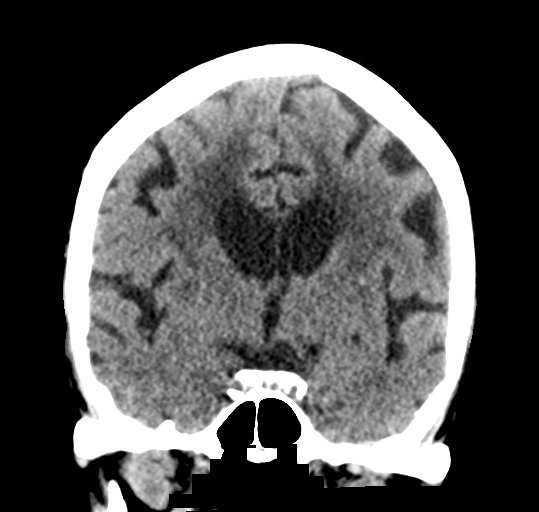

[Series 5: head 3.0 mpr sag · sagittal · 0.33mm/px · 3 of 55 slices shown]
[im 19/55  brain]
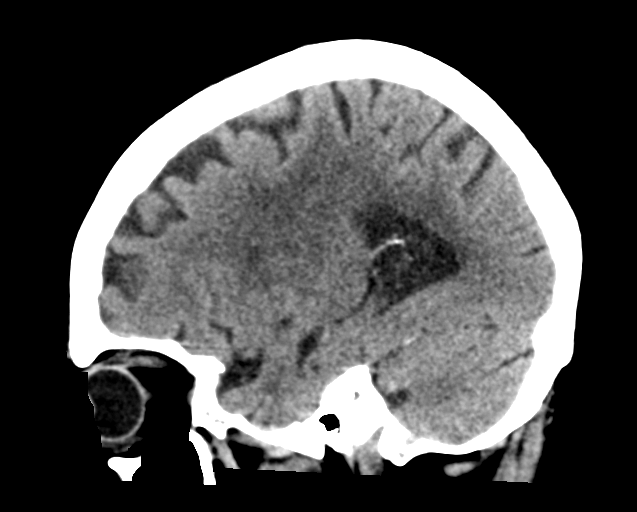
[im 28/55  brain]
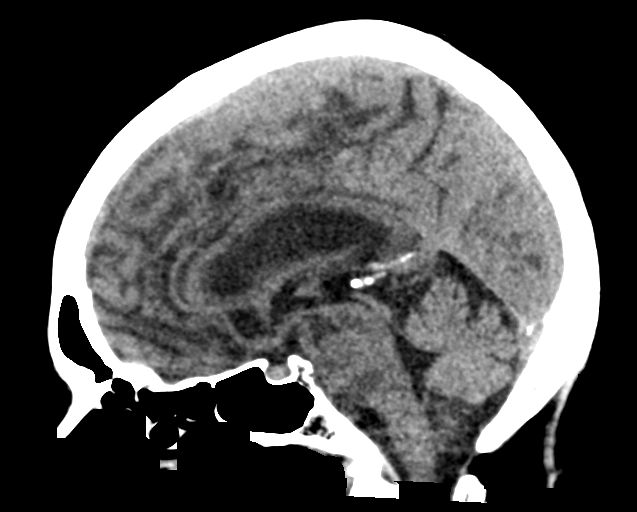
[im 37/55  brain]
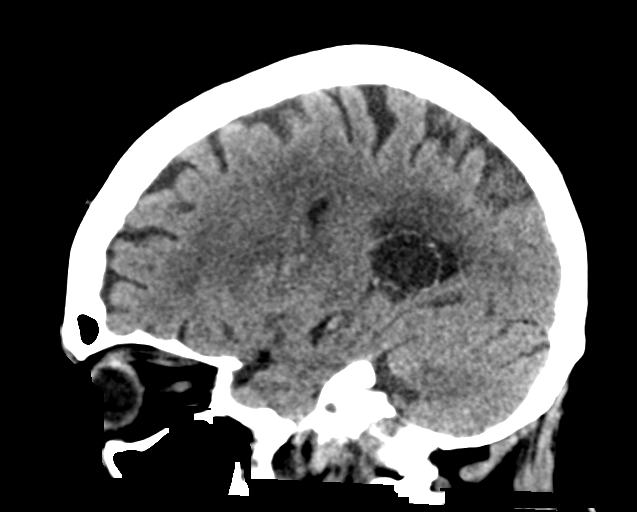

[15 of 47 positions shown; findings below may reference images not displayed]

FINDINGS: Brain: No acute intracranial hemorrhage. No focal mass lesion. No CT
evidence of acute infarction. No midline shift or mass effect. No
hydrocephalus. Basilar cisterns are patent.

There are periventricular and subcortical white matter
hypodensities. Focal deep white matter infarction in the RIGHT
centrum semiovale. Generalized cortical atrophy.

Vascular: No hyperdense vessel or unexpected calcification.

Skull: Normal. Negative for fracture or focal lesion.

Sinuses/Orbits: No acute finding.

Other: None.
IMPRESSION: 1. No acute intracranial findings.
2. Atrophy and chronic white matter microvascular disease

## 2019-04-24 IMAGING — CR CHEST - 2 VIEW
2 series · 2 of 2 positions shown · non-contrast
Comparison: [DATE]

CLINICAL DATA: Chest pain

EXAM:
CHEST - 2 VIEW

[chest lat]
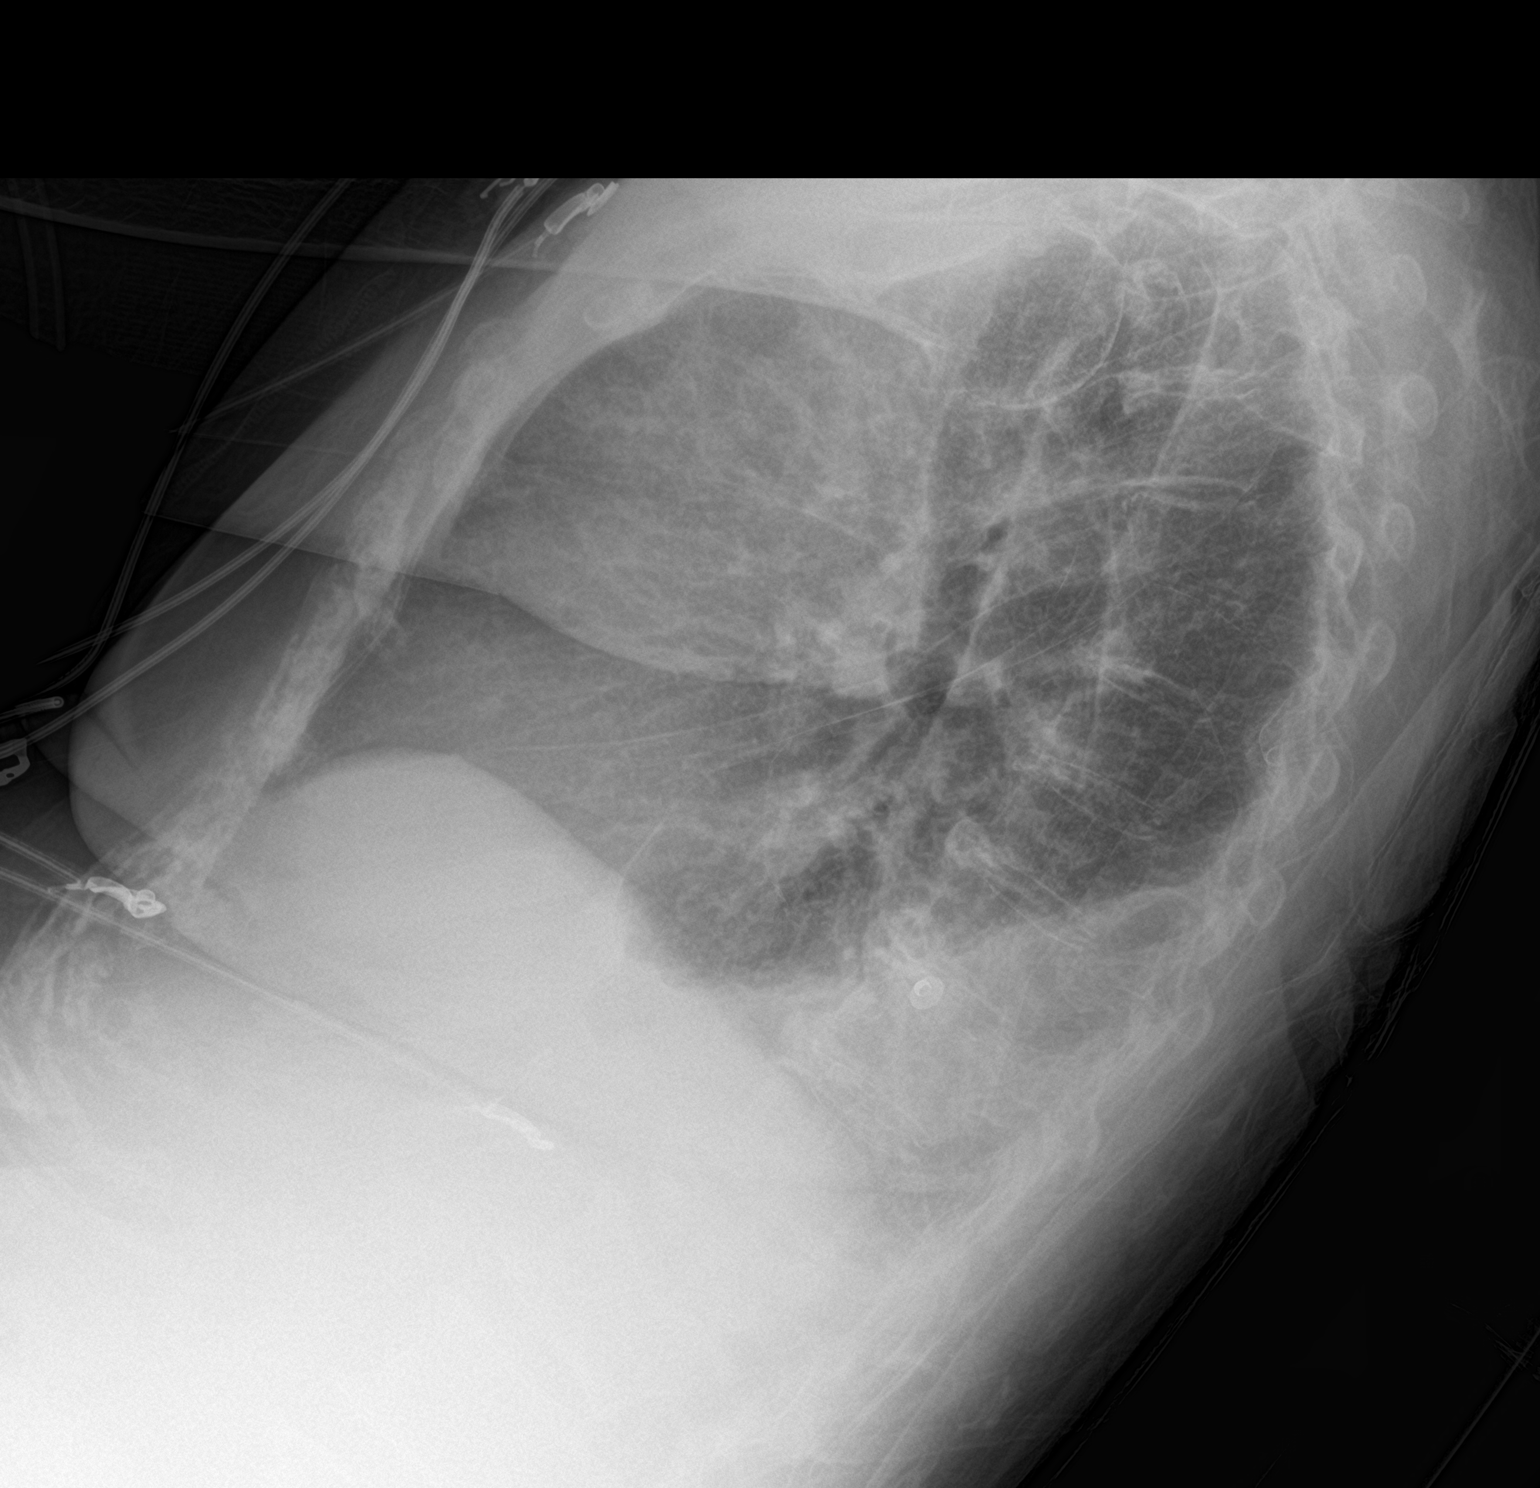

[chest ap]
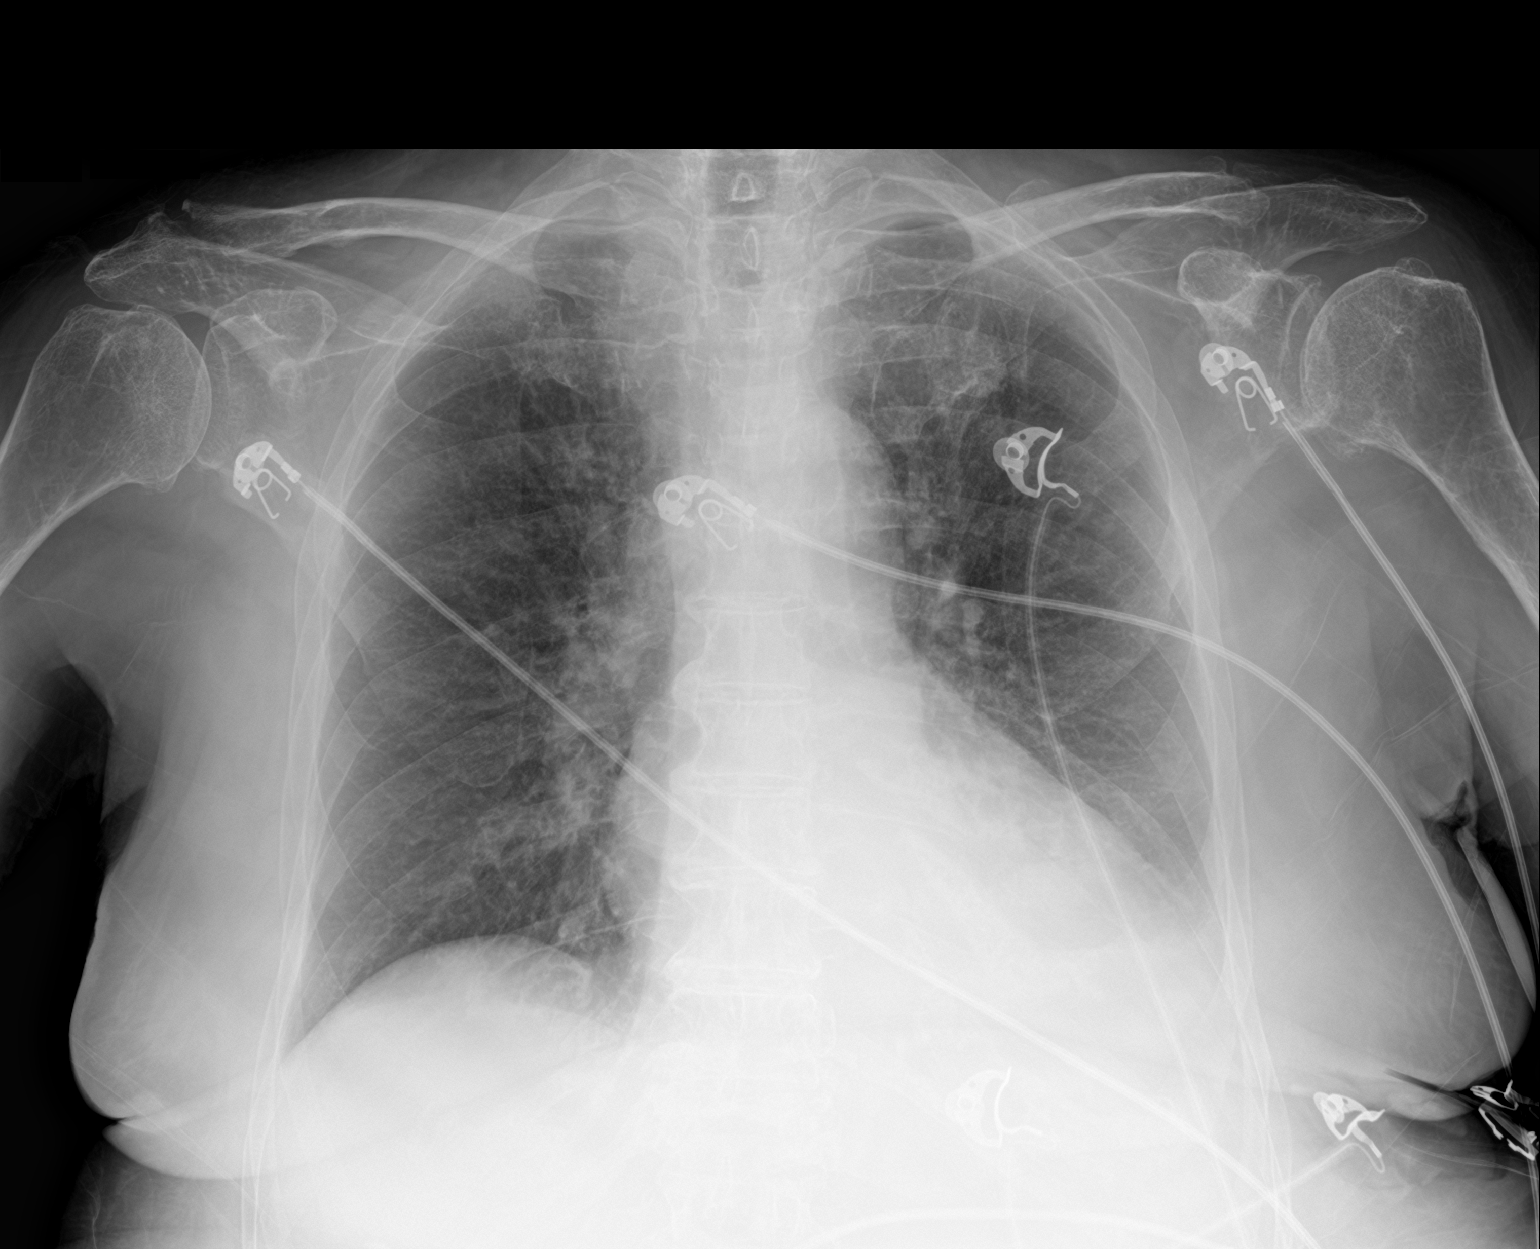

[2 of 2 positions shown; findings below may reference images not displayed]

FINDINGS: Upper normal heart size.

Mediastinal contours and pulmonary vascularity normal.

Bronchitic changes and chronic accentuation of perihilar markings,
stable.

Mild LEFT basilar atelectasis and question minimal LEFT pleural
effusion

No acute infiltrate, RIGHT pleural effusion or pneumothorax.

Bones demineralized.
IMPRESSION: Mild RIGHT basilar atelectasis and question minimal LEFT pleural
effusion.

Chronic bronchitic changes.

## 2019-04-24 IMAGING — MR MRI HEAD WITHOUT CONTRAST
9 of 10 series · 37 of 48 positions shown · non-contrast
Comparison: Head CT [DATE] and MRI/MRA [DATE]

CLINICAL DATA: Slurred speech and altered mentation.

EXAM:
MRI HEAD WITHOUT CONTRAST
TECHNIQUE: Multiplanar, multiecho pulse sequences of the brain and surrounding
structures were obtained without intravenous contrast.

[Series 3: DWI · axial · 3.0mm · 1.09mm/px · z∈[-62,+90]mm · 11 of 104 slices shown (1 of 4)]
[im 1/104]
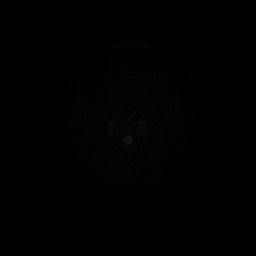
[im 11/104]
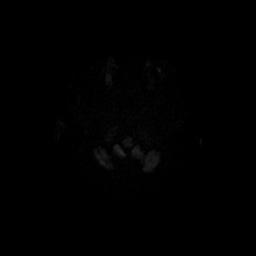
[im 21/104]
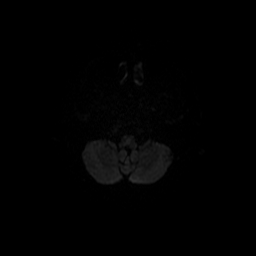
[im 31/104]
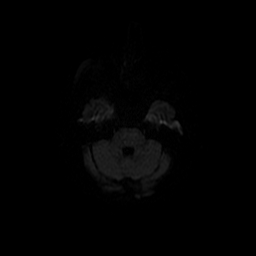
[im 42/104]
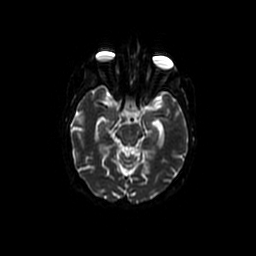
[im 52/104]
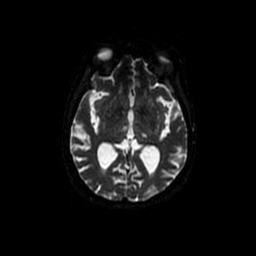
[im 62/104]
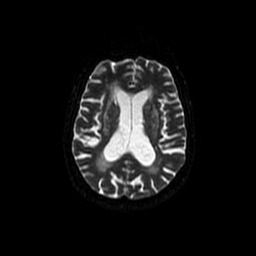
[im 73/104]
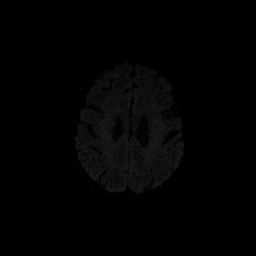
[im 83/104]
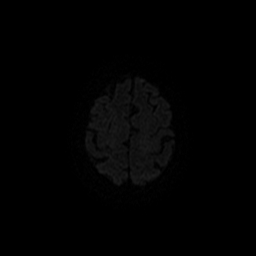
[im 93/104]
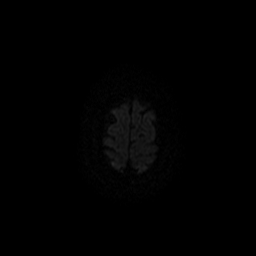
[im 104/104]
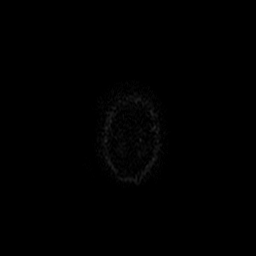

[Series 4: T1 · sagittal · 5.0mm · 0.47mm/px · 3 of 23 slices shown (1 of 2)]
[im 1/23]
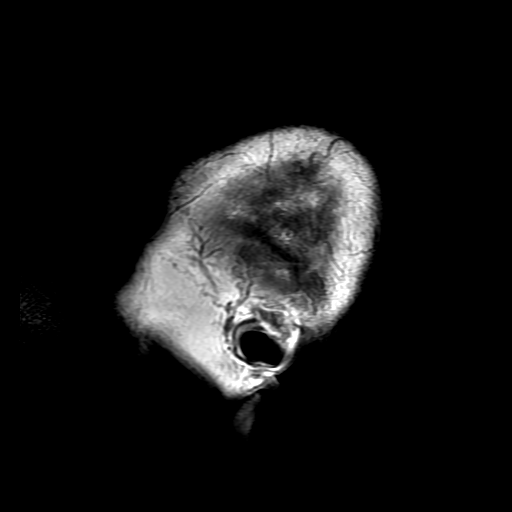
[im 12/23]
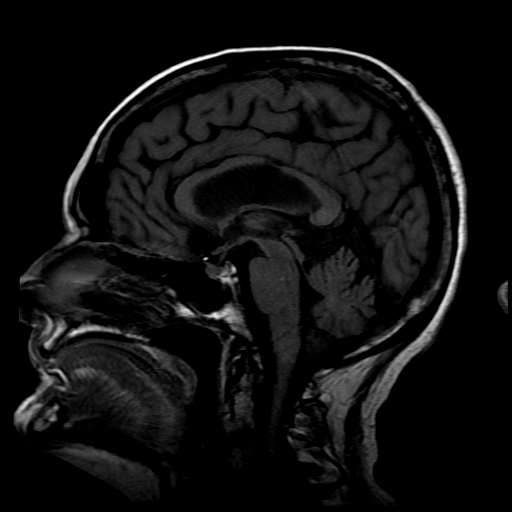
[im 23/23]
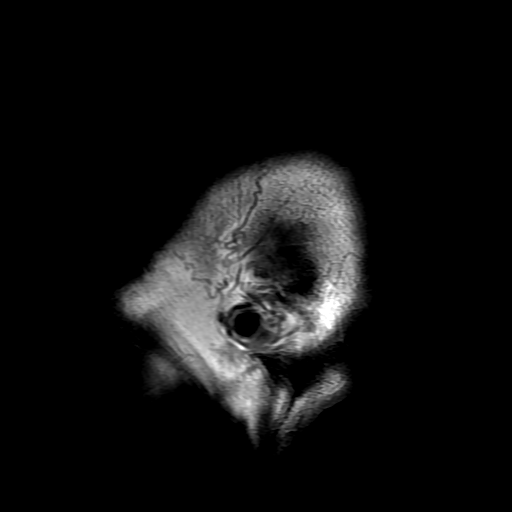

[Series 5: DWI · coronal · 5.0mm · 1.09mm/px · 7 of 70 slices shown (2 of 4)]
[im 1/70]
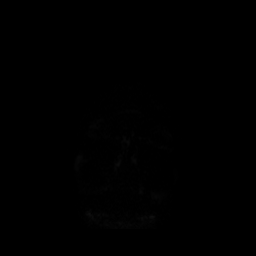
[im 12/70]
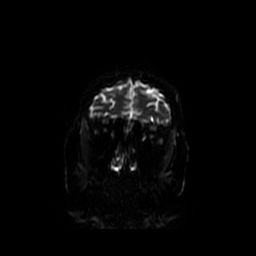
[im 24/70]
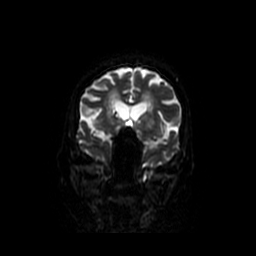
[im 35/70]
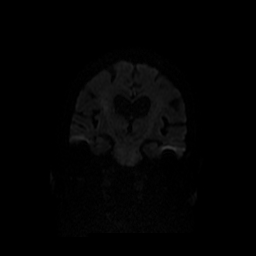
[im 47/70]
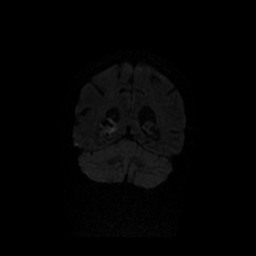
[im 58/70]
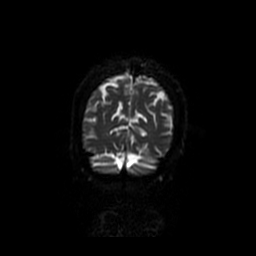
[im 70/70]
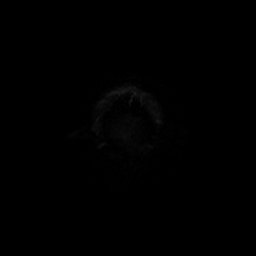

[Series 6: FLAIR · axial · 3.0mm · 0.43mm/px · z∈[-50,+94]mm · 2 of 25 slices shown]
[im 1/25]
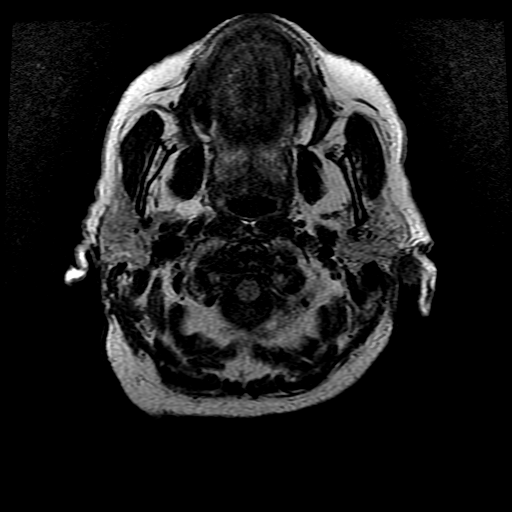
[im 25/25]
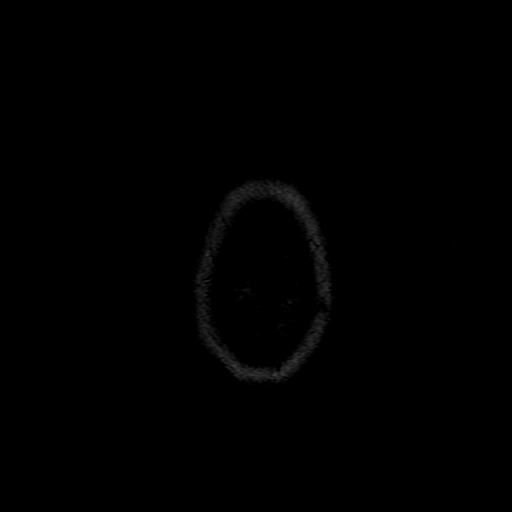

[Series 7: T2 · axial · 5.0mm · 0.43mm/px · z∈[-50,+94]mm · 2 of 25 slices shown (1 of 2)]
[im 1/25]
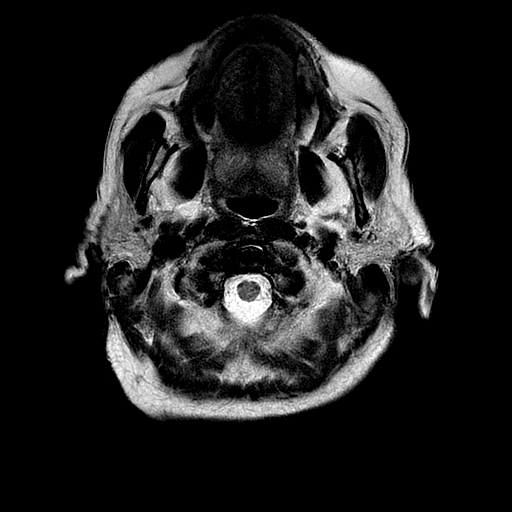
[im 25/25]
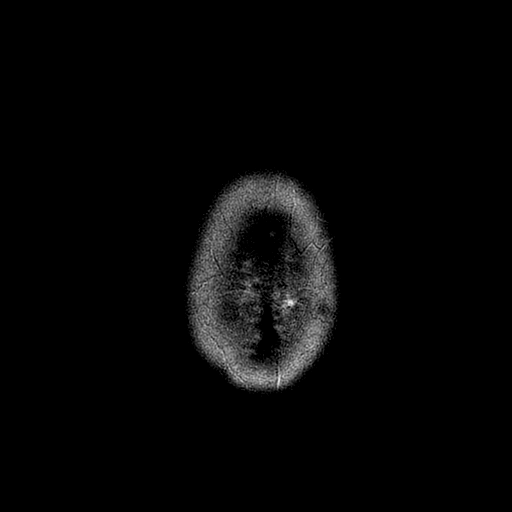

[Series 9: T1 · axial · 3.0mm · 0.47mm/px · 1 of 100 slices shown (2 of 2)]
[im 1/100]
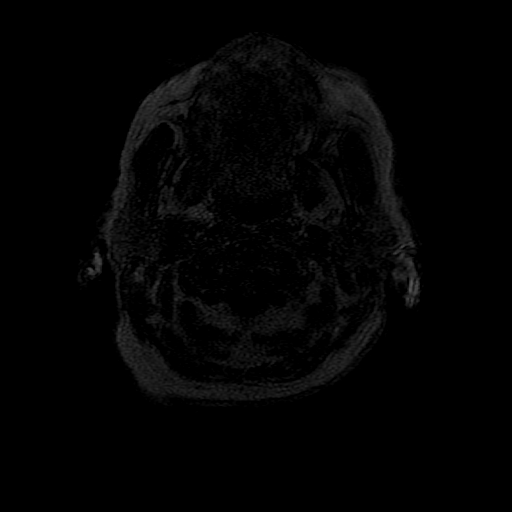

[Series 10: T2 · coronal · 5.0mm · 0.39mm/px · 3 of 27 slices shown (2 of 2)]
[im 1/27]
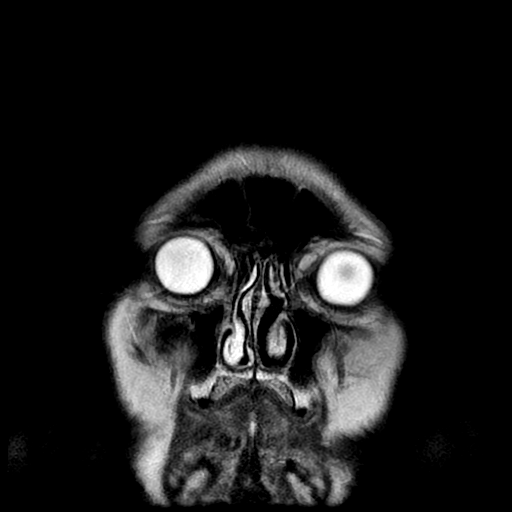
[im 14/27]
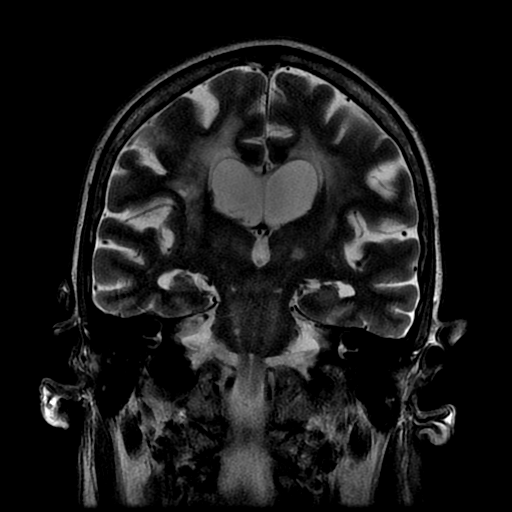
[im 27/27]
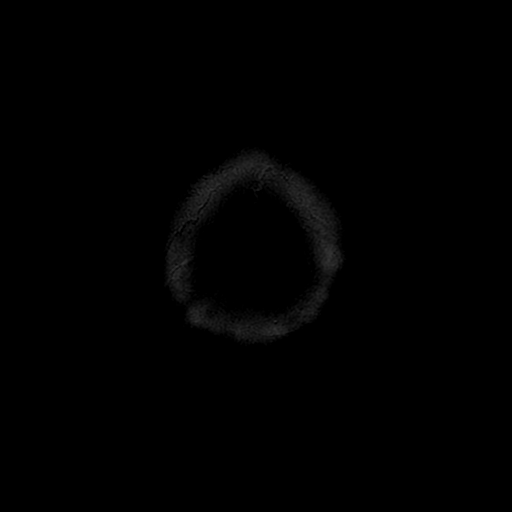

[Series 300: DWI · axial · 3.0mm · 1.09mm/px · z∈[-62,+90]mm · 5 of 51 slices shown (3 of 4)]
[im 1/51]
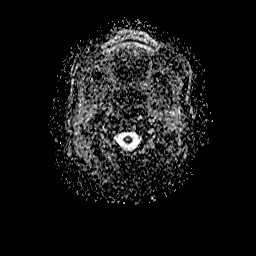
[im 13/51]
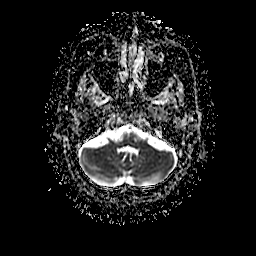
[im 26/51]
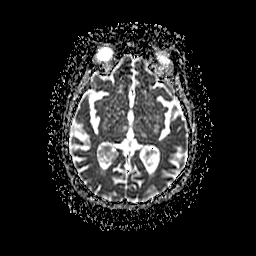
[im 38/51]
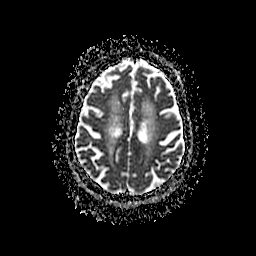
[im 51/51]
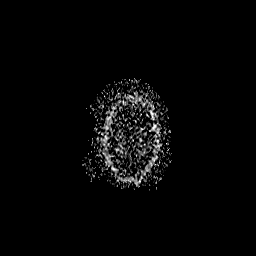

[Series 500: DWI · coronal · 5.0mm · 1.09mm/px · 3 of 35 slices shown (4 of 4)]
[im 1/35]
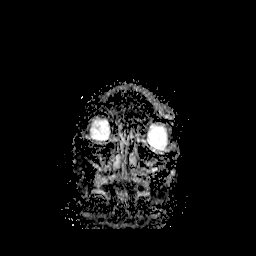
[im 18/35]
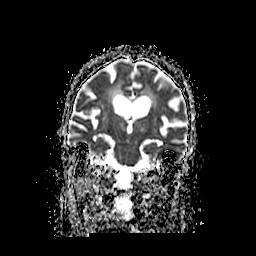
[im 35/35]
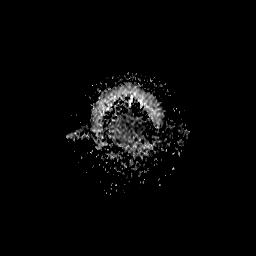

[37 of 48 positions shown; findings below may reference images not displayed]

FINDINGS: Brain: There is a punctate focus of trace diffusion hyperintensity
in the white matter lateral to the atrium of the left lateral
ventricle on both axial and coronal acquisitions. No definite
reduced ADC is identified, however assessment is limited by its
small size. There is a small amount of residual diffusion
abnormality in the right corona radiata at the site of the acute
infarct on the prior MRI. Background patchy and confluent T2
hyperintensities in the bilateral cerebral white matter and pons are
similar to the prior MRI and nonspecific but compatible with severe
chronic small vessel ischemic disease. Chronic lacunar infarcts are
again seen in the bilateral basal ganglia, thalami, and corpus
callosum. There is moderate cerebral atrophy.

Vascular: Major intracranial vascular flow voids are preserved.
Known 7 mm right MCA bifurcation aneurysm.

Skull and upper cervical spine: Unremarkable bone marrow signal.

Sinuses/Orbits: Unremarkable orbits. Clear paranasal sinuses. Trace
bilateral mastoid fluid.

Other: None.
IMPRESSION: 1. Punctate acute or subacute left periatrial white matter infarct.
2. Severe chronic small vessel ischemic disease.

## 2019-04-24 MED ORDER — POTASSIUM CHLORIDE CRYS ER 20 MEQ PO TBCR
40.0000 meq | EXTENDED_RELEASE_TABLET | Freq: Once | ORAL | Status: AC
  Administered 2019-04-24: 40 meq via ORAL
  Filled 2019-04-24: qty 2

## 2019-04-24 NOTE — Discharge Instructions (Addendum)
MRI today showed a very small new infarct on the left side of your brain.  Neurology evaluated you in the ER and determined that you were medically optimized and there was no indication for repeat admission today.  Continue all of your medicines including Plavix.  You have an upcoming appointment with neurology on 6/30, make sure that you go to this appointment for further management.  Return to the ER for worsening or new symptoms, headache, vision changes or loss, difficulty swallowing, difficulty with speech or balance, one-sided weakness drooping or numbness

## 2019-04-24 NOTE — ED Notes (Signed)
Pt given water and sandwich bag 

## 2019-04-24 NOTE — Progress Notes (Signed)
Patient scheduled for a virtual visit today. I called the nursing home at the scheduled time and was told she was en route to the hospital. Appointment will be rescheduled.

## 2019-04-24 NOTE — ED Provider Notes (Signed)
[facility]ENC[facility]MENT Provider Note   CSN: [id number] Arrival date & time: 04/24/19  1041    History   Chief Complaint Chief Complaint  Patient presents with  . Chest Pain    HPI Connie Oconnor is a 78 y.o. female with a past medical history of prior SAH, arthritis, anemia, presents to ED for evaluation of centralized chest pressure that began about 3 hours ago.  She states that she had just finished eating breakfast and was trying to stand to complete her physical therapy.  She states that "I did not even know I was having the pressure."  It appears the therapist asked her why she didn't want to get up for therapy when they knew something was wrong.  States that her blood pressures was in the 200s systolic during the incident.  She is unsure when the symptoms resolved because she states that "I did not even know that I had it."  She currently denies any chest pain or pressure, shortness of breath, cough, fever, hemoptysis, leg swelling.  She is currently in physical therapy for residual left-sided deficits from her prior stroke.  She last saw a cardiologist she believes in [id number].  Denies a history of hypertension.     HPI  Past Medical History:  Diagnosis Date  . Anemia    hx of  . Arthritis   . Dislocation of right knee with medial meniscus tear   . Headache    hx of migraines none since 1990's  . Hypothyroidism    1978, no problems since  . Pneumonia 1990's none since  . PVC (premature ventricular contraction)    199'0's none since  . SAH (subarachnoid hemorrhage) (HCC) 2016   On the right  . Stroke [facility](HCC)     Patient Active Problem List   Diagnosis Date Noted  . GI bleed 04/04/2019  . HLD (hyperlipidemia) 04/04/2019  . Vascular dementia without behavioral disturbance (HCC)   . 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 Gastric erosion   . Gastrointestinal hemorrhage   . Acute respiratory failure with hypoxia  (HCC)   . Hyponatremia   . Tobacco abuse   . Pulmonary emphysema (HCC)   . Pulmonary nodules   . Primary osteoarthritis of right hip   . Subacute confusional state   . Lethargy   . Confusion, postoperative   . Slow transit constipation   . Pain of left calf   . Impulsiveness   . Dysphagia, post-stroke   . Labile blood pressure   . Hyperglycemia   . Hypotension   . Hypoalbuminemia due to protein-calorie malnutrition (HCC)   . Acute blood loss anemia   . Hypoglycemia   . Essential hypertension   . History of traumatic brain injury   . Lacunar infarct, acute (HCC) 02/23/2019  . 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 Hypothyroidism (acquired) 02/20/2019  . Closed fracture of medial portion of right tibial plateau 01/11/2018  . Acute medial meniscal tear, right, initial encounter 01/11/2018  . SAH (subarachnoid hemorrhage) (HCC) 03/29/2017    Past Surgical History:  Procedure Laterality Date  . BACK SURGERY    . CERVICAL LAMINECTOMY  1986  . DILATION AND CURETTAGE OF UTERUS  1970  . ELBOW SURGERY Right 1993  . ESOPHAGOGASTRODUODENOSCOPY N/A 03/20/2019   Procedure: ESOPHAGOGASTRODUODENOSCOPY (EGD);  Surgeon: Tressia DanasBeavers, Kimberly, MD;  [facility];  Service: Gastroenterology;  Laterality: N/A;  . IR ANGIO  INTRA EXTRACRAN SEL COM CAROTID INNOMINATE BILAT MOD SED  02/22/2019  . IR ANGIO VERTEBRAL SEL SUBCLAVIAN INNOMINATE UNI L MOD SED  02/22/2019  . IR ANGIO VERTEBRAL SEL VERTEBRAL UNI R MOD SED  02/22/2019  . KNEE ARTHROSCOPY WITH SUBCHONDROPLASTY Right 01/11/2018   Procedure: KNEE ARTHROSCOPY WITH SUBCHONDROPLASTY, PARTIAL MEDIAL MENISCECTOMY, CHONDROPLASTY OF THE MEDIAL PATELLA FEMORAL CHONDYL, MEDIAL PLICA  EXCISION;  Surgeon: Jodi GeraldsGraves, John, MD;  Location: [facility] ORS;  Service: Orthopedics;  Laterality: Right;  . LUMBAR LAMINECTOMY  1993   rods placed bone generated removed one year later   . MULTIPLE TOOTH  EXTRACTIONS    . SHOULDER SURGERY Left    arthritis clean out and shortened collar bone dr Renae Ficklepaul  . TONSILLECTOMY  1970  . VAGINAL HYSTERECTOMY  1971   partial dr Dewaine Congerbarker  . WISDOM TOOTH EXTRACTION       OB History   No obstetric history on file.      Home Medications    Prior to Admission medications   Medication Sig Start Date End Date Taking? Authorizing Provider  acetaminophen (TYLENOL) 325 MG tablet Take 2 tablets (650 mg total) by mouth every 4 (four) hours as needed for mild pain (or temp > 37.5 C (99.5 F)). 03/27/19   Angiulli, Mcarthur Rossettianiel J, PA-C  atorvastatin (LIPITOR) 40 MG tablet Take 1 tablet (40 mg total) by mouth daily at 6 PM. 02/23/19   Azucena FallenLancaster, William C, MD  clopidogrel (PLAVIX) 75 MG tablet Take 1 tablet (75 mg total) by mouth daily. 02/24/19   Azucena FallenLancaster, William C, MD  diclofenac sodium (VOLTAREN) 1 % GEL Apply 2 g topically 4 (four) times daily. 03/27/19   Angiulli, Mcarthur Rossettianiel J, PA-C  docusate sodium (COLACE) 100 MG capsule Take 100 mg by mouth daily.    [provider]  gabapentin (NEURONTIN) 100 MG capsule Take 100 mg by mouth 3 (three) times daily.    [provider]  losartan (COZAAR) 25 MG tablet Take 1 tablet (25 mg total) by mouth daily. 03/27/19   Angiulli, Mcarthur Rossettianiel J, PA-C  magnesium oxide (MAG-OX) 400 MG tablet Take 400 mg by mouth daily.    [provider]  Melatonin 3 MG TABS Take 6 mg by mouth at bedtime.    [provider]  Multiple Vitamin (MULTIVITAMIN WITH MINERALS) TABS tablet Take 1 tablet by mouth daily. 03/27/19   Angiulli, Mcarthur Rossettianiel J, PA-C  nicotine (NICODERM CQ - DOSED IN MG/24 HOURS) 14 mg/24hr patch Place 1 patch (14 mg total) onto the skin daily. 03/27/19   Angiulli, Mcarthur Rossettianiel J, PA-C  pantoprazole (PROTONIX) 40 MG tablet Take 1 tablet (40 mg total) by mouth 2 (two) times daily before a meal. 03/27/19   Angiulli, Mcarthur Rossettianiel J, PA-C  polyethylene glycol (MIRALAX / GLYCOLAX) 17 g packet Take 17 g by mouth 2 (two) times daily.  03/27/19   Angiulli, Mcarthur Rossettianiel J, PA-C  sucralfate (CARAFATE) 1 g tablet Take 1 g by mouth 4 (four) times daily. 04/04/19 04/24/19  [provider]    Family History Family History  Problem Relation Age of Onset  . Stroke Mother   . Heart attack Father     Social History Social History   Tobacco Use  . Smoking status: Current Every Day Smoker    Packs/day: 1.00    Years: 72.00    Pack years: 72.00    Types: Cigarettes  . Smokeless tobacco: Never Used  Substance Use Topics  . Alcohol use: No  . Drug use: No  Allergies   Lactose intolerance (gi), Aspirin, Ibuprofen, and Penicillins   Review of Systems Review of Systems  Constitutional: Negative for appetite change, chills and fever.  HENT: Negative for ear pain, rhinorrhea, sneezing and sore throat.   Eyes: Negative for photophobia and visual disturbance.  Respiratory: Negative for cough, chest tightness, shortness of breath and wheezing.   Cardiovascular: Positive for chest pain. Negative for palpitations.  Gastrointestinal: Negative for abdominal pain, blood in stool, constipation, diarrhea, nausea and vomiting.  Genitourinary: Negative for dysuria, hematuria and urgency.  Musculoskeletal: Negative for myalgias.  Skin: Negative for rash.  Neurological: Negative for dizziness, weakness and light-headedness.     Physical Exam Updated Vital Signs BP (!) 152/67   Pulse 70   Temp 97.9 F (36.6 C) (Oral)   Resp 14   SpO2 95%   Physical Exam Vitals signs and nursing note reviewed.  Constitutional:      General: She is not in acute distress.    Appearance: She is well-developed.  HENT:     Head: Normocephalic and atraumatic.     Nose: Nose normal.  Eyes:     General: No scleral icterus.       Left eye: No discharge.     Conjunctiva/sclera: Conjunctivae normal.  Neck:     Musculoskeletal: Normal range of motion and neck supple.  Cardiovascular:     Rate and Rhythm: Normal rate and regular rhythm.      Heart sounds: Normal heart sounds. No murmur. No friction rub. No gallop.   Pulmonary:     Effort: Pulmonary effort is normal. No respiratory distress.     Breath sounds: Normal breath sounds.  Abdominal:     General: Bowel sounds are normal. There is no distension.     Palpations: Abdomen is soft.     Tenderness: There is no abdominal tenderness. There is no guarding.  Musculoskeletal: Normal range of motion.  Skin:    General: Skin is warm and dry.     Findings: No rash.  Neurological:     Mental Status: She is alert and oriented to person, place, and time.     Motor: Weakness (LUE, LLE since previous stroke) present. No abnormal muscle tone.     Coordination: Coordination normal.     Comments: PERRL. No facial asymmetry. No aphasia.      ED Treatments / Results  Labs (all labs ordered are listed, but only abnormal results are displayed) Labs Reviewed  BASIC METABOLIC PANEL - Abnormal; Notable for the following components:      Result Value   Potassium 3.3 (*)    Glucose, Bld 103 (*)    BUN 5 (*)    Calcium 7.9 (*)    All other components within normal limits  CBC WITH DIFFERENTIAL/PLATELET - Abnormal; Notable for the following components:   RBC 3.44 (*)    Hemoglobin 8.4 (*)    HCT 28.4 (*)    MCH 24.4 (*)    MCHC 29.6 (*)    RDW 17.4 (*)    Platelets 607 (*)    All other components within normal limits  SARS CORONAVIRUS 2 (HOSPITAL ORDER, PERFORMED IN [facility])  TROPONIN I (HIGH SENSITIVITY)  TROPONIN I (HIGH SENSITIVITY)    EKG EKG Interpretation  Date/Time:  Tuesday April 24 2019 11:53:57 EDT Ventricular Rate:  66 PR Interval:    QRS Duration: 81 QT Interval:  413 QTC Calculation: 433 R Axis:   40 Text Interpretation:  Sinus rhythm  Probable left atrial enlargement Anterolateral infarct, old No significant change since last tracing Confirmed by Deno Etienne [phone]) on 04/24/2019 2:23:12 PM   Radiology Dg Chest 2 View  Result Date:  04/24/2019 CLINICAL DATA:  Chest pain EXAM: CHEST - 2 VIEW COMPARISON:  03/13/2019 FINDINGS: Upper normal heart size. Mediastinal contours and pulmonary vascularity normal. Bronchitic changes and chronic accentuation of perihilar markings, stable. Mild LEFT basilar atelectasis and question minimal LEFT pleural effusion No acute infiltrate, RIGHT pleural effusion or pneumothorax. Bones demineralized. IMPRESSION: Mild RIGHT basilar atelectasis and question minimal LEFT pleural effusion. Chronic bronchitic changes. Electronically Signed   By: Lavonia Dana M.D.   On: 04/24/2019 11:39    Procedures Procedures (including critical care time)  Medications Ordered in ED Medications  potassium chloride SA (K-DUR) CR tablet 40 mEq (has no administration in time range)     Initial Impression / Assessment and Plan / ED Course  I have reviewed the triage vital signs and the nursing notes.  Pertinent labs & imaging results that were available during my care of the patient were reviewed by me and considered in my medical decision making (see chart for details).        78 year old female with past medical history of prior SAH, diabetes, anemia presents for central chest pressure that began 3 hours ago.  Patient somewhat unclear about her symptoms. According to family they were concerned that she may have had another stroke today. They state that her symptoms at the time slurred speech) has resolved. Unfortunately I was told this after cardiac workup was completed.  Initial and delta troponins are both negative.  Hemoglobin of 8.4 similar to prior.  Potassium mildly low at 3.3 will be repleted orally.  EKG shows sinus rhythm with no changes from prior tracings.  Chest x-ray is unremarkable ?possible pleural effusion.  Will need to obtain CT of the head to rule out acute abnormality.  Consider MRI for further imaging due to her history. Care handed off to [place]. Anticipate dispo home if imaging unremarkable,  as she is scheduled for f/u neurology appointment this week.    Portions of this note were generated with Lobby[employer]. Dictation errors may occur despite best attempts at proofreading.   Final Clinical Impressions(s) / ED Diagnoses   Final diagnoses:  Chest wall pain    ED Discharge Orders    None       Delia Heady, PA-C 04/24/19 [place], [place], DO 04/24/19 1554

## 2019-04-24 NOTE — ED Notes (Signed)
Report provided to Lenord Carbo LPN at [facility].

## 2019-04-24 NOTE — Consult Note (Addendum)
Requesting Physician: Sharen Hecklaudia Gibbons PA-C    Chief Complaint: Dysarthria, chest pain   History obtained from: Patient and Chart     HPI:                                                                                                                                       Connie Oconnor is a 78 y.o. 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  Patient states that she did not want to work with the therapist and got upset.  Shortly after her speech was slurred and she noticed she was having chest pain.  Blood pressure reported was greater than 200 systolic.  Symptoms resolved on arrival [facility].  She initially underwent work-up for chest pain which was negative.  ED provider upon getting history from family been ordered an MRI brain to evaluate for transient episode of slurred speech.  MRI brain showed punctate acute to subacute infarct in the left and periventricular white matter.  Neurology was consulted for further recommendations.  Currently patient feels like she is back to her baseline.  History was obtained by patient as well as speaking to her daughter over the phone.  Patient underwent detailed work-up in April 2020 right coronary radiata stroke including MRI, MRA, 2D echo, lipid profile and A1c.  Patient was placed on dual antiplatelets however developed GI bleed and has been continued on Plavix without subsequent complications.    Past Medical History:  Diagnosis Date  . Anemia    hx of  . Arthritis   . Dislocation of right knee with medial meniscus tear   . Headache    hx of  migraines none since 1990's  . Hypothyroidism    1978, no problems since  . Pneumonia 1990's none since  . PVC (premature ventricular contraction)    199'0's none since  . SAH (subarachnoid hemorrhage) (HCC) 2016   On the right  . Stroke [facility](HCC)     Past Surgical History:  Procedure Laterality Date  . BACK SURGERY    . CERVICAL LAMINECTOMY  1986  . DILATION AND CURETTAGE OF UTERUS  1970  . ELBOW SURGERY Right 1993  . ESOPHAGOGASTRODUODENOSCOPY N/A 03/20/2019   Procedure: ESOPHAGOGASTRODUODENOSCOPY (EGD);  Surgeon: Tressia DanasBeavers, Kimberly, MD;  [facility];  Service: Gastroenterology;  Laterality: N/A;  . IR ANGIO INTRA EXTRACRAN SEL COM CAROTID INNOMINATE BILAT MOD SED  02/22/2019  . IR ANGIO VERTEBRAL SEL SUBCLAVIAN INNOMINATE UNI L MOD SED  02/22/2019  . IR ANGIO VERTEBRAL SEL VERTEBRAL UNI R MOD SED  02/22/2019  .  KNEE ARTHROSCOPY WITH SUBCHONDROPLASTY Right 01/11/2018   Procedure: KNEE ARTHROSCOPY WITH SUBCHONDROPLASTY, PARTIAL MEDIAL MENISCECTOMY, CHONDROPLASTY OF THE MEDIAL PATELLA FEMORAL CHONDYL, MEDIAL PLICA  EXCISION;  Surgeon: Dorna Leitz, MD;  Location: [facility] ORS;  Service: Orthopedics;  Laterality: Right;  . LUMBAR LAMINECTOMY  1993   rods placed bone generated removed one year later   . MULTIPLE TOOTH EXTRACTIONS    . SHOULDER SURGERY Left    arthritis clean out and shortened collar bone dr Eddie Dibbles  . TONSILLECTOMY  1970  . VAGINAL HYSTERECTOMY  1971   partial dr Mallie Mussel  . WISDOM TOOTH EXTRACTION      Family History  Problem Relation Age of Onset  . Stroke Mother   . Heart attack Father    Social History:  reports that she has been smoking cigarettes. She has a 72.00 pack-year smoking history. She has never used smokeless tobacco. She reports that she does not drink alcohol or use drugs.  Allergies:  Allergies  Allergen Reactions  . Lactose Intolerance (Gi) Diarrhea  . Aspirin Other (See Comments)    shaking  . Ensure [Nutritional Supplements] Diarrhea  .  Ibuprofen Other (See Comments)    Causes "shaking"  . Penicillins Rash and Other (See Comments)    Has patient had a PCN reaction causing immediate rash, facial/tongue/throat swelling, SOB or lightheadedness with hypotension: No Has patient had a PCN reaction causing severe rash involving mucus membranes or skin necrosis: No Has patient had a PCN reaction that required hospitalization: Was already in hospital Has patient had a PCN reaction occurring within the last 10 years: No If all of the above answers are "NO", then may proceed with Cephalosporin use.     Medications:                                                                                                                        I reviewed home medications   ROS:                                                                                                                                     14 systems reviewed and negative except above    Examination:  General: Appears well-developed  Psych: Affect appropriate to situation Eyes: No scleral injection HENT: No OP obstrucion Head: Normocephalic.  Cardiovascular: Normal rate and regular rhythm.  Respiratory: Effort normal and breath sounds normal to anterior ascultation GI: Soft.  No distension. There is no tenderness.  Skin: WDI    Neurological Examination Mental Status: Alert, oriented, thought content appropriate.  Speech fluent without evidence of aphasia. Able to follow 3 step commands without difficulty. Cranial Nerves: II: Visual fields grossly normal,  III,IV, VI: ptosis not present, extra-ocular motions intact bilaterally, pupils equal, round, reactive to light and accommodation V,VII: Mild left nasolabial fold flattening VIII: hearing normal bilaterally IX,X: uvula rises symmetrically XI: bilateral shoulder shrug XII: midline tongue extension Motor: Right  : Upper extremity   5/5    Left:     Upper extremity   4/5  Lower extremity   5/5     Lower extremity   3/5, contracted Tone and bulk: Increased tone in the left lower extremity Sensory: Pinprick and light touch intact throughout, bilaterally Plantars: Right: downgoing   Left: downgoing Cerebellar: normal finger-to-nose bilaterally Gait: Deferred     Lab Results: Basic Metabolic Panel: Recent Labs  Lab 04/24/19 1140  NA 135  K 3.3*  CL 100  CO2 26  GLUCOSE 103*  BUN 5*  CREATININE 0.48  CALCIUM 7.9*    CBC: Recent Labs  Lab 04/24/19 1140  WBC 5.7  NEUTROABS 4.5  HGB 8.4*  HCT 28.4*  MCV 82.6  PLT 607*    Coagulation Studies: No results for input(s): LABPROT, INR in the last 72 hours.  Imaging: Dg Chest 2 View  Result Date: 04/24/2019 CLINICAL DATA:  Chest pain EXAM: CHEST - 2 VIEW COMPARISON:  03/13/2019 FINDINGS: Upper normal heart size. Mediastinal contours and pulmonary vascularity normal. Bronchitic changes and chronic accentuation of perihilar markings, stable. Mild LEFT basilar atelectasis and question minimal LEFT pleural effusion No acute infiltrate, RIGHT pleural effusion or pneumothorax. Bones demineralized. IMPRESSION: Mild RIGHT basilar atelectasis and question minimal LEFT pleural effusion. Chronic bronchitic changes. Electronically Signed   By: Ulyses SouthwardMark  Boles M.D.   On: 04/24/2019 11:39   Ct Head Wo Contrast  Result Date: 04/24/2019 CLINICAL DATA:  Ct head wo, complaint of sudden onset chest discomfort slurred speech and slowed mentation. This lasted for a short period of time and then resolved. Patient does not remember anything that happened. She was sent to rule out a.*comment was truncated*Altered level of consciousness (LOC), unexplained EXAM: CT HEAD WITHOUT CONTRAST TECHNIQUE: Contiguous axial images were obtained from the base of the skull through the vertex without intravenous contrast. COMPARISON:  CT 03/29/2017 FINDINGS: Brain: No acute  intracranial hemorrhage. No focal mass lesion. No CT evidence of acute infarction. No midline shift or mass effect. No hydrocephalus. Basilar cisterns are patent. There are periventricular and subcortical white matter hypodensities. Focal deep white matter infarction in the RIGHT centrum semiovale. Generalized cortical atrophy. Vascular: No hyperdense vessel or unexpected calcification. Skull: Normal. Negative for fracture or focal lesion. Sinuses/Orbits: No acute finding. Other: None. IMPRESSION: 1. No acute intracranial findings. 2. Atrophy and chronic white matter microvascular disease Electronically Signed   By: Genevive BiStewart  Edmunds M.D.   On: 04/24/2019 16:37   Mr Brain Wo Contrast  Result Date: 04/24/2019 CLINICAL DATA:  Slurred speech and altered mentation. EXAM: MRI HEAD WITHOUT CONTRAST TECHNIQUE: Multiplanar, multiecho pulse sequences of the brain and surrounding structures were obtained without intravenous contrast. COMPARISON:  Head CT 04/24/2019 and MRI/MRA 02/20/2019 FINDINGS: Brain:  There is a punctate focus of trace diffusion hyperintensity in the white matter lateral to the atrium of the left lateral ventricle on both axial and coronal acquisitions. No definite reduced ADC is identified, however assessment is limited by its small size. There is a small amount of residual diffusion abnormality in the right corona radiata at the site of the acute infarct on the prior MRI. Background patchy and confluent T2 hyperintensities in the bilateral cerebral white matter and pons are similar to the prior MRI and nonspecific but compatible with severe chronic small vessel ischemic disease. Chronic lacunar infarcts are again seen in the bilateral basal ganglia, thalami, and corpus callosum. There is moderate cerebral atrophy. Vascular: Major intracranial vascular flow voids are preserved. Known 7 mm right MCA bifurcation aneurysm. Skull and upper cervical spine: Unremarkable bone marrow signal. Sinuses/Orbits:  Unremarkable orbits. Clear paranasal sinuses. Trace bilateral mastoid fluid. Other: None. IMPRESSION: 1. 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 2. Severe chronic small vessel ischemic disease. Electronically Signed   By: Sebastian AcheAllen  Grady M.D.   On: 04/24/2019 17:50     ASSESSMENT AND PLAN  78 y.o. 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 Patient outside window for tPA.    Hypertensive emergency Punctate acute/subacute infarct in the left periventricular white matter region  Recommendations Continue Plavix 75 mg daily Blood pressure control less than 140/90 mmHg Continue physical therapy No inpatient stroke work-up required due to recent work-up, infarct likely secondary to small vessel disease from uncontrolled hypertension.  Patient medically optimized with antiplatelet regimen, further escalation of antiplatelet regimen increases risk for GI bleeding. Patient can be discharged back to nursing facility, however if she develops signs/symptoms of stroke will need to come back to the emergency room  Patient has scheduled neurology follow-up on 6/30 with Dr. Pearlean BrownieSethi.  Ebrahim Deremer [facility] Pager Number [phone]

## 2019-04-24 NOTE — ED Notes (Signed)
Patient transported to MRI 

## 2019-04-24 NOTE — ED Notes (Signed)
Neuro at bedside.

## 2019-04-24 NOTE — ED Provider Notes (Signed)
1559: Hand off from previous EDPA and shift change pending imaging. See previous note for full details. Briefly pt came in with report of CP while doing PT.  Pt "wasn't aware she was having chest pain".  CP work up initiated and unremarkable. CP free now.  Family then reported concern for stroke again today. Per family report she had slurred speech and glazed over look at around 8 am, symptoms have resolved. Left sided weakness at baseline from previous stroke.    Plan is to f/u on CT and MRI. If unremarkable can be discharged. She has close neuro f/u. ER plan of care discussed with patient and family by previous EDPA and agreed upon.   Physical Exam  BP (!) 158/65   Pulse 70   Temp 97.9 F (36.6 C) (Oral)   Resp 15   SpO2 95%   Physical Exam Constitutional:      Appearance: She is well-developed.  HENT:     Head: Normocephalic.     Nose: Nose normal.  Eyes:     General: Lids are normal.  Neck:     Musculoskeletal: Normal range of motion.  Cardiovascular:     Rate and Rhythm: Normal rate.  Pulmonary:     Effort: Pulmonary effort is normal. No respiratory distress.  Neurological:     Mental Status: She is alert.     Motor: Weakness present.     Comments:  Awake in hall bed.  Speech is slowed but no dysarthria or aphasia. CN I and II not tested. CN III-XII intact bilaterally.  RIGHT strength 5/5.RIGHT sensation grossly intact. LEFT strength 3-4/5 in upper/lower extremities. LEFT sensation grossly intact No RIGHT pronator drift.  LEFT pronator drift.  LLE is spastic.    Psychiatric:        Behavior: Behavior normal.     ED Course/Procedures   Clinical Course as of Apr 23 1848  Tue Apr 24, 2019  1735 Hemoglobin(!): 8.4 [CG]  1735 Potassium(!): 3.3 [CG]  1735 No acute findings   CT Head Wo Contrast [CG]  1735 Mild RIGHT basilar atelectasis and question minimal LEFT pleural effusion.  Chronic bronchitic changes.  DG Chest 2 View [CG]  1736 Non ischemic, no changes   EKG  12-Lead [CG]  1736 Troponin I (High Sensitivity): 6 [CG]  1759 IMPRESSION: 1. 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 2. Severe chronic small vessel ischemic disease.  MR BRAIN WO CONTRAST [CG]    Clinical Course User Index [CG] Liberty HandyGibbons, Shadrack Brummitt J, PA-C    .Critical Care Performed by: Liberty HandyGibbons, Garald Rhew J, PA-C Authorized by: Liberty HandyGibbons, Dushawn Pusey J, PA-C   Critical care provider statement:    Critical care time (minutes):  45   Critical care was necessary to treat or prevent imminent or life-threatening deterioration of the following conditions:  CNS failure or compromise (acute infarct)   Critical care was time spent personally by me on the following activities:  Discussions with consultants, evaluation of patient's response to treatment, examination of patient, ordering and performing treatments and interventions, ordering and review of laboratory studies, ordering and review of radiographic studies, pulse oximetry, re-evaluation of patient's condition, obtaining history from patient or surrogate and review of old charts   I assumed direction of critical care for this patient from another provider in my specialty: no      MDM   1850: MRI with punctuate acute vs subacute LEFT infarct.  Spoke to Dr Aroor who has reviewed patient's chart. She is already  on Plavix and appears to be medically optimized. She had recent admission/work up for stroke 01/2019.  Dr Aroor to hand patient off to oncoming neurologist Dr Leonel Ramsay who will see patient in ER.   1950: Dr Lorraine Lax (neurology) has evaluated patient in the ER comfortable with discharge and she is med optimized and had recent stroke work up/admission.  VSS. Pt comfortable with plan. She has upcoming neuro appointment with Dr Leonie Man 6/30.     Kinnie Feil, PA-C 04/24/19 1952    Malvin Johns, MD 04/24/19 [phone]

## 2019-04-24 NOTE — ED Triage Notes (Addendum)
Pt arrived via [facility] fro [facility]; Pt with c/o centralized chest pressure this al with HTN; staff reports bp of 280/110; pt denies CP upon EMS arrival

## 2019-04-24 NOTE — ED Notes (Signed)
Didn't have any success on getting patient blood

## 2019-04-24 NOTE — ED Notes (Signed)
purewick is hooked up to suction.

## 2019-04-25 ENCOUNTER — Inpatient Hospital Stay (HOSPITAL_COMMUNITY)
Admission: EM | Admit: 2019-04-25 | Discharge: 2019-04-28 | DRG: 064 | Disposition: A | Discharge status: Skilled Nursing Facility | Payer: Medicare Other | Attending: Internal Medicine | Admitting: Internal Medicine

## 2019-04-25 ENCOUNTER — Encounter (HOSPITAL_COMMUNITY): Payer: Self-pay

## 2019-04-25 ENCOUNTER — Other Ambulatory Visit: Payer: Self-pay

## 2019-04-25 ENCOUNTER — Emergency Department (HOSPITAL_COMMUNITY): Payer: Medicare Other

## 2019-04-25 DIAGNOSIS — F17210 Nicotine dependence, cigarettes, uncomplicated: Secondary | ICD-10-CM | POA: Diagnosis present

## 2019-04-25 DIAGNOSIS — K2960 Other gastritis without bleeding: Secondary | ICD-10-CM

## 2019-04-25 DIAGNOSIS — Z87820 Personal history of traumatic brain injury: Secondary | ICD-10-CM | POA: Diagnosis not present

## 2019-04-25 DIAGNOSIS — E785 Hyperlipidemia, unspecified: Secondary | ICD-10-CM | POA: Diagnosis present

## 2019-04-25 DIAGNOSIS — M549 Dorsalgia, unspecified: Secondary | ICD-10-CM | POA: Diagnosis present

## 2019-04-25 DIAGNOSIS — D649 Anemia, unspecified: Secondary | ICD-10-CM | POA: Diagnosis present

## 2019-04-25 DIAGNOSIS — D62 Acute posthemorrhagic anemia: Secondary | ICD-10-CM | POA: Diagnosis present

## 2019-04-25 DIAGNOSIS — J9811 Atelectasis: Secondary | ICD-10-CM | POA: Diagnosis present

## 2019-04-25 DIAGNOSIS — M1990 Unspecified osteoarthritis, unspecified site: Secondary | ICD-10-CM | POA: Diagnosis present

## 2019-04-25 DIAGNOSIS — I10 Essential (primary) hypertension: Secondary | ICD-10-CM | POA: Diagnosis present

## 2019-04-25 DIAGNOSIS — I69349 Monoplegia of lower limb following cerebral infarction affecting unspecified side: Secondary | ICD-10-CM

## 2019-04-25 DIAGNOSIS — I639 Cerebral infarction, unspecified: Secondary | ICD-10-CM | POA: Diagnosis present

## 2019-04-25 DIAGNOSIS — R569 Unspecified convulsions: Secondary | ICD-10-CM | POA: Diagnosis present

## 2019-04-25 DIAGNOSIS — Z8249 Family history of ischemic heart disease and other diseases of the circulatory system: Secondary | ICD-10-CM | POA: Diagnosis not present

## 2019-04-25 DIAGNOSIS — R400 Somnolence: Secondary | ICD-10-CM | POA: Diagnosis present

## 2019-04-25 DIAGNOSIS — Z823 Family history of stroke: Secondary | ICD-10-CM

## 2019-04-25 DIAGNOSIS — Z1159 Encounter for screening for other viral diseases: Secondary | ICD-10-CM | POA: Diagnosis not present

## 2019-04-25 DIAGNOSIS — Z7902 Long term (current) use of antithrombotics/antiplatelets: Secondary | ICD-10-CM

## 2019-04-25 DIAGNOSIS — E039 Hypothyroidism, unspecified: Secondary | ICD-10-CM | POA: Diagnosis present

## 2019-04-25 DIAGNOSIS — I609 Nontraumatic subarachnoid hemorrhage, unspecified: Secondary | ICD-10-CM | POA: Diagnosis present

## 2019-04-25 DIAGNOSIS — F0150 Vascular dementia without behavioral disturbance: Secondary | ICD-10-CM | POA: Diagnosis present

## 2019-04-25 DIAGNOSIS — Z66 Do not resuscitate: Secondary | ICD-10-CM | POA: Diagnosis present

## 2019-04-25 DIAGNOSIS — I493 Ventricular premature depolarization: Secondary | ICD-10-CM | POA: Diagnosis present

## 2019-04-25 DIAGNOSIS — Z79891 Long term (current) use of opiate analgesic: Secondary | ICD-10-CM | POA: Diagnosis not present

## 2019-04-25 LAB — CBC WITH DIFFERENTIAL/PLATELET
Abs Immature Granulocytes: 0.02 10*3/uL (ref 0.00–0.07)
Basophils Absolute: 0 10*3/uL (ref 0.0–0.1)
Basophils Relative: 1 %
Eosinophils Absolute: 0 10*3/uL (ref 0.0–0.5)
Eosinophils Relative: 0 %
HCT: 26.4 % — ABNORMAL LOW (ref 36.0–46.0)
Hemoglobin: 7.7 g/dL — ABNORMAL LOW (ref 12.0–15.0)
Immature Granulocytes: 0 %
Lymphocytes Relative: 14 %
Lymphs Abs: 0.9 10*3/uL (ref 0.7–4.0)
MCH: 24.1 pg — ABNORMAL LOW (ref 26.0–34.0)
MCHC: 29.2 g/dL — ABNORMAL LOW (ref 30.0–36.0)
MCV: 82.5 fL (ref 80.0–100.0)
Monocytes Absolute: 0.7 10*3/uL (ref 0.1–1.0)
Monocytes Relative: 12 %
Neutro Abs: 4.7 10*3/uL (ref 1.7–7.7)
Neutrophils Relative %: 73 %
Platelets: 589 10*3/uL — ABNORMAL HIGH (ref 150–400)
RBC: 3.2 MIL/uL — ABNORMAL LOW (ref 3.87–5.11)
RDW: 17.7 % — ABNORMAL HIGH (ref 11.5–15.5)
WBC: 6.3 10*3/uL (ref 4.0–10.5)
nRBC: 0 % (ref 0.0–0.2)

## 2019-04-25 LAB — COMPREHENSIVE METABOLIC PANEL
ALT: 21 U/L (ref 0–44)
AST: 17 U/L (ref 15–41)
Albumin: 1.7 g/dL — ABNORMAL LOW (ref 3.5–5.0)
Alkaline Phosphatase: 90 U/L (ref 38–126)
Anion gap: 7 (ref 5–15)
BUN: 6 mg/dL — ABNORMAL LOW (ref 8–23)
CO2: 25 mmol/L (ref 22–32)
Calcium: 8 mg/dL — ABNORMAL LOW (ref 8.9–10.3)
Chloride: 101 mmol/L (ref 98–111)
Creatinine, Ser: 0.47 mg/dL (ref 0.44–1.00)
GFR calc Af Amer: >60 mL/min (ref >60)
GFR calc non Af Amer: >60 mL/min (ref >60)
Glucose, Bld: 121 mg/dL — ABNORMAL HIGH (ref 70–99)
Potassium: 3.3 mmol/L — ABNORMAL LOW (ref 3.5–5.1)
Sodium: 133 mmol/L — ABNORMAL LOW (ref 135–145)
Total Bilirubin: 0.5 mg/dL (ref 0.3–1.2)
Total Protein: 5.5 g/dL — ABNORMAL LOW (ref 6.5–8.1)

## 2019-04-25 IMAGING — CT CT HEAD WITHOUT CONTRAST
4 series · 17 of 47 positions shown, 19 images · non-contrast
Comparison: [DATE] CT and MRI

CLINICAL DATA: Altered level of consciousness. Small left parietal
white matter infarct seen on recent MRI.

EXAM:
CT HEAD WITHOUT CONTRAST
TECHNIQUE: Contiguous axial images were obtained from the base of the skull
through the vertex without intravenous contrast.

[Series 3: head without · axial · non-contrast · 0.45mm/px · z∈[-95,+40]mm · 7 of 37 slices shown, 9 images]
[im 5/37  brain]
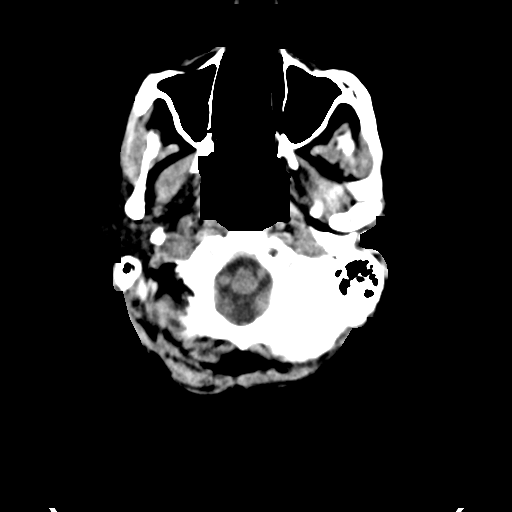
[im 5/37  bone]
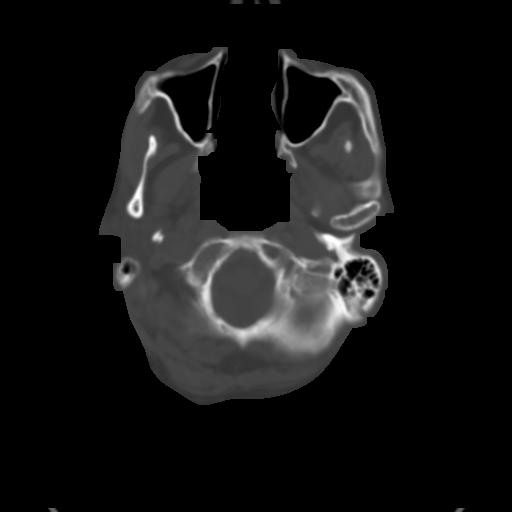
[im 10/37  brain]
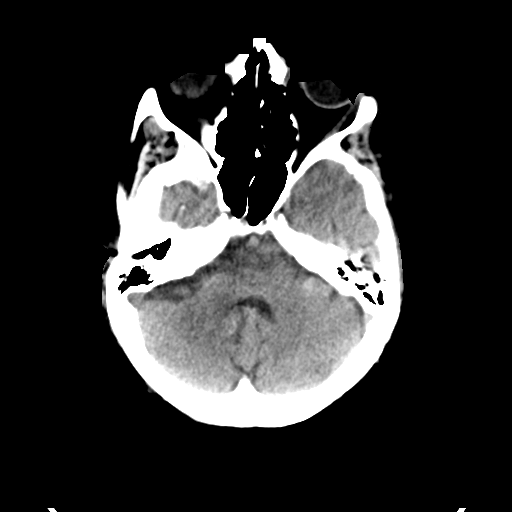
[im 14/37  brain]
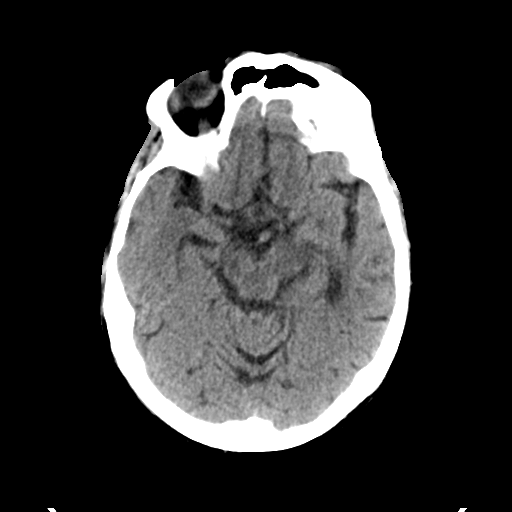
[im 19/37  brain]
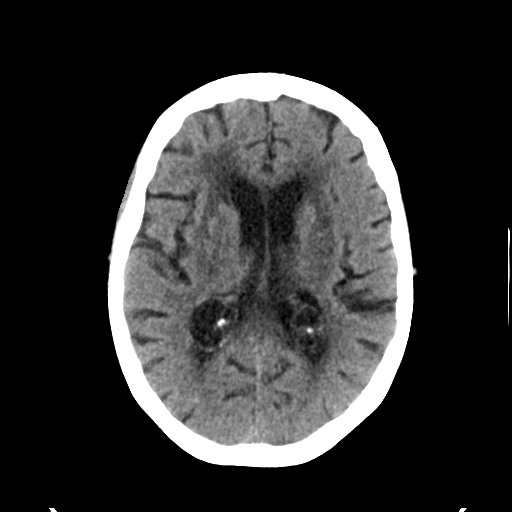
[im 23/37  brain]
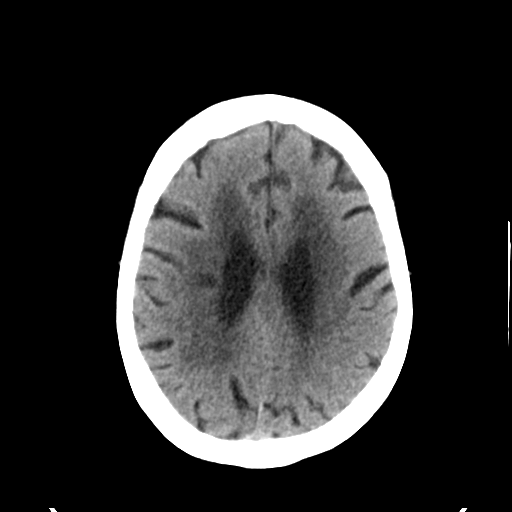
[im 23/37  bone]
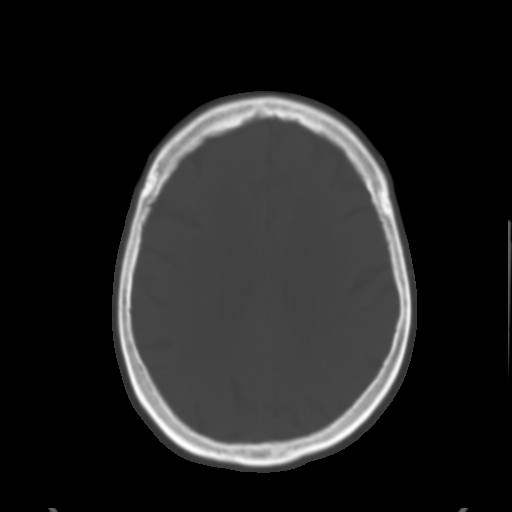
[im 28/37  brain]
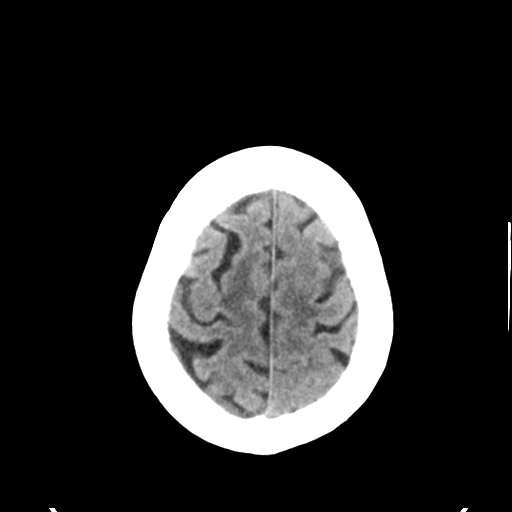
[im 32/37  brain]
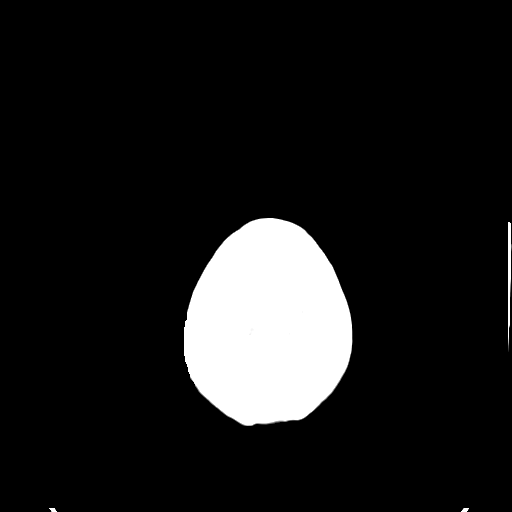

[Series 4: head bone · axial · 0.45mm/px · z∈[-97,-35]mm · 4 of 91 slices shown]
[im 10/91  bone]
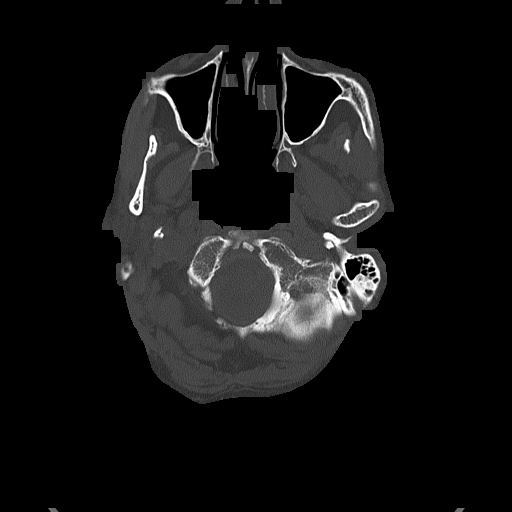
[im 19/91  bone]
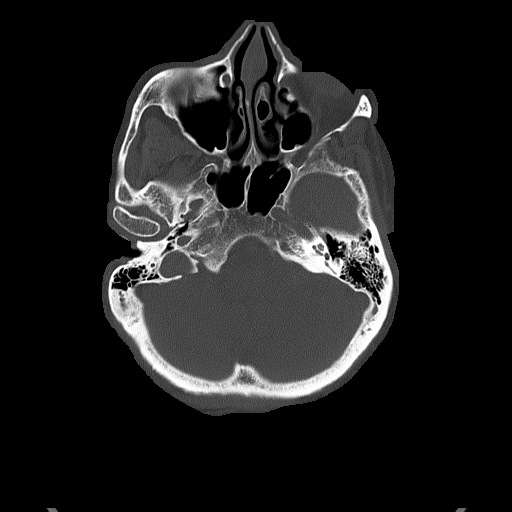
[im 28/91  bone]
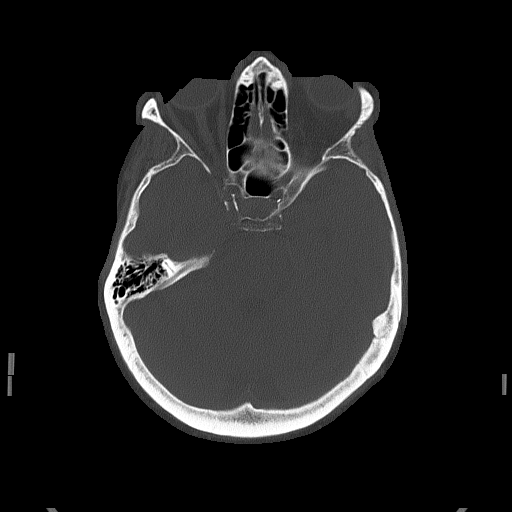
[im 41/91  bone]
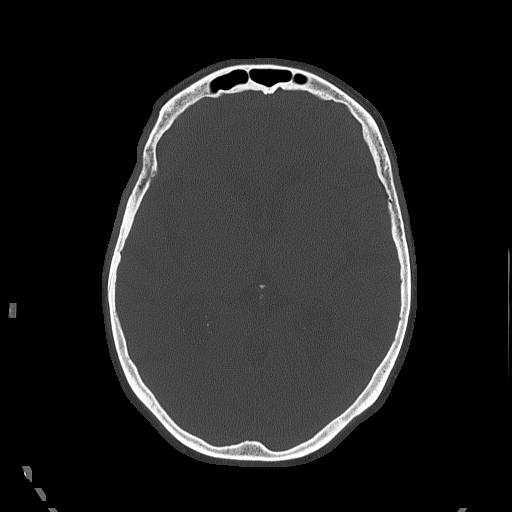

[Series 5: head without cor · coronal · non-contrast · 0.32mm/px · 3 of 67 slices shown]
[im 23/67  brain]
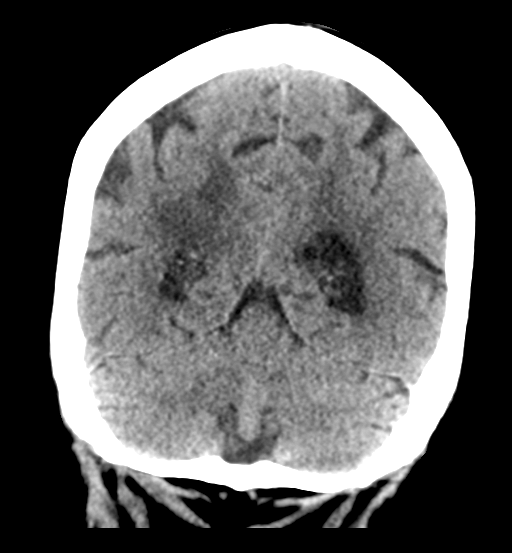
[im 30/67  brain]
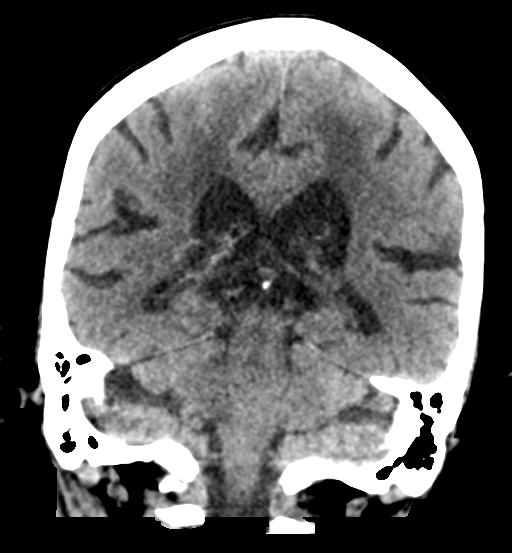
[im 37/67  brain]
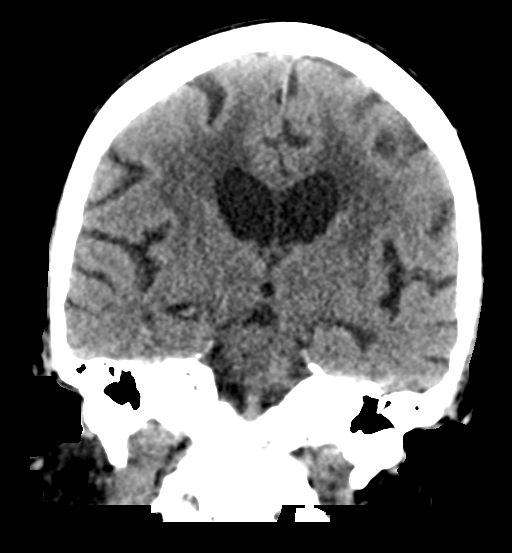

[Series 6: head without sag · sagittal · non-contrast · 0.33mm/px · 3 of 55 slices shown]
[im 19/55  brain]
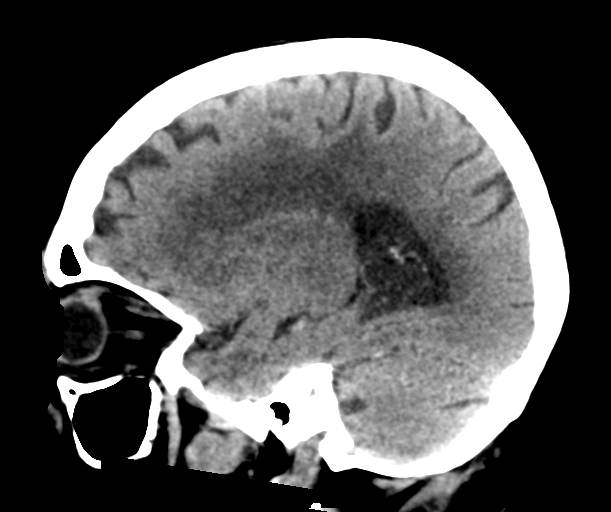
[im 28/55  brain]
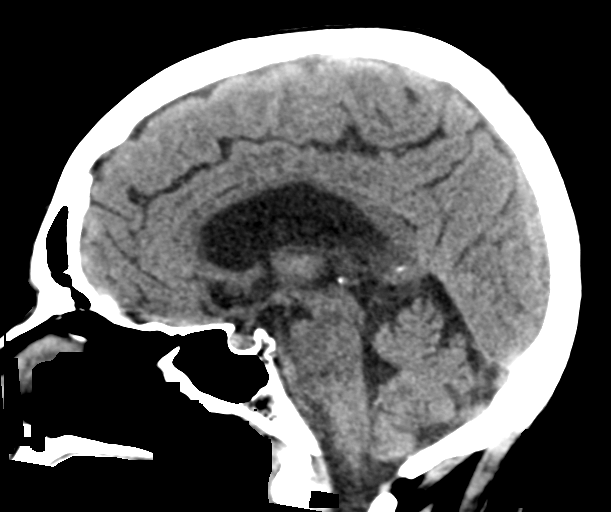
[im 37/55  brain]
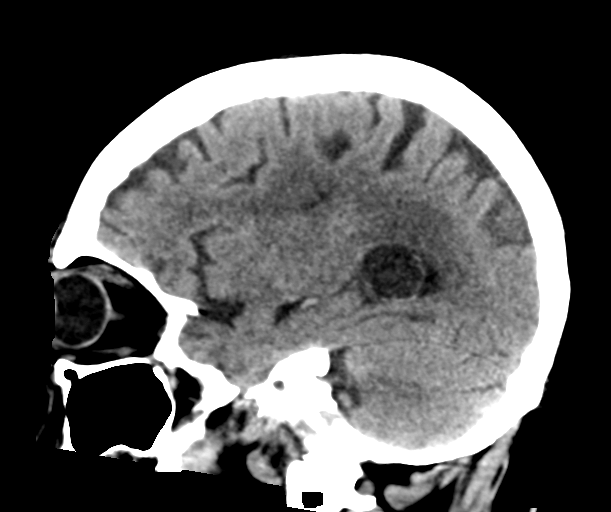

[17 of 47 positions shown; findings below may reference images not displayed]

FINDINGS: Brain: Negative for acute hemorrhage, mass lesion, midline shift or
hydrocephalus or large new infarct. Again noted is extensive low
density in the white matter. Again noted is a focal area of low
density in the right parietal white matter tract compatible with old
infarct or ischemia.

Vascular: No hyperdense vessel or unexpected calcification.

Skull: Normal. Negative for fracture or focal lesion.

Sinuses/Orbits: No significant sinus disease.

Other: None
IMPRESSION: 1. No acute intracranial abnormality.
2. Chronic white matter disease is similar to the recent comparison
examination. Findings are suggestive for chronic small vessel
ischemic changes.

## 2019-04-25 MED ORDER — MAGNESIUM OXIDE 400 (241.3 MG) MG PO TABS
400.0000 mg | ORAL_TABLET | Freq: Two times a day (BID) | ORAL | Status: DC
  Administered 2019-04-25 – 2019-04-28 (×6): 400 mg via ORAL
  Filled 2019-04-25 (×6): qty 1

## 2019-04-25 MED ORDER — MELATONIN 3 MG PO TABS
6.0000 mg | ORAL_TABLET | Freq: Every day | ORAL | Status: DC
  Administered 2019-04-25 – 2019-04-27 (×3): 6 mg via ORAL
  Filled 2019-04-25 (×4): qty 2

## 2019-04-25 MED ORDER — ACETAMINOPHEN 325 MG PO TABS
650.0000 mg | ORAL_TABLET | Freq: Three times a day (TID) | ORAL | Status: DC
  Administered 2019-04-25 – 2019-04-28 (×8): 650 mg via ORAL
  Filled 2019-04-25 (×8): qty 2

## 2019-04-25 MED ORDER — POTASSIUM CHLORIDE CRYS ER 20 MEQ PO TBCR
40.0000 meq | EXTENDED_RELEASE_TABLET | Freq: Once | ORAL | Status: AC
  Administered 2019-04-25: 22:00:00 40 meq via ORAL
  Filled 2019-04-25: qty 4

## 2019-04-25 MED ORDER — PANTOPRAZOLE SODIUM 40 MG PO TBEC
40.0000 mg | DELAYED_RELEASE_TABLET | Freq: Two times a day (BID) | ORAL | Status: DC
  Administered 2019-04-26 – 2019-04-28 (×5): 40 mg via ORAL
  Filled 2019-04-25 (×5): qty 1

## 2019-04-25 MED ORDER — ATORVASTATIN CALCIUM 40 MG PO TABS
40.0000 mg | ORAL_TABLET | Freq: Every day | ORAL | Status: DC
  Administered 2019-04-26 – 2019-04-27 (×2): 40 mg via ORAL
  Filled 2019-04-25 (×2): qty 1

## 2019-04-25 MED ORDER — DICLOFENAC SODIUM 1 % TD GEL
2.0000 g | Freq: Four times a day (QID) | TRANSDERMAL | Status: DC
  Administered 2019-04-25 – 2019-04-28 (×10): 2 g via TOPICAL
  Filled 2019-04-25: qty 100

## 2019-04-25 MED ORDER — SERTRALINE HCL 50 MG PO TABS
25.0000 mg | ORAL_TABLET | Freq: Every day | ORAL | Status: DC
  Administered 2019-04-26 – 2019-04-28 (×3): 25 mg via ORAL
  Filled 2019-04-25 (×3): qty 1

## 2019-04-25 MED ORDER — GABAPENTIN 100 MG PO CAPS
100.0000 mg | ORAL_CAPSULE | Freq: Three times a day (TID) | ORAL | Status: DC
  Administered 2019-04-25 – 2019-04-28 (×8): 100 mg via ORAL
  Filled 2019-04-25 (×8): qty 1

## 2019-04-25 MED ORDER — LEVETIRACETAM 500 MG PO TABS
500.0000 mg | ORAL_TABLET | Freq: Two times a day (BID) | ORAL | Status: DC
  Administered 2019-04-25 – 2019-04-27 (×4): 500 mg via ORAL
  Filled 2019-04-25 (×4): qty 1

## 2019-04-25 MED ORDER — POLYETHYLENE GLYCOL 3350 17 G PO PACK: 17.0000 g | PACK | Freq: Two times a day (BID) | ORAL | Status: DC | PRN

## 2019-04-25 MED ORDER — SUCRALFATE 1 G PO TABS
1.0000 g | ORAL_TABLET | Freq: Three times a day (TID) | ORAL | Status: DC
  Administered 2019-04-25 – 2019-04-28 (×11): 1 g via ORAL
  Filled 2019-04-25 (×11): qty 1

## 2019-04-25 MED ORDER — DOCUSATE SODIUM 100 MG PO CAPS: 100.0000 mg | ORAL_CAPSULE | Freq: Every day | ORAL | Status: DC | PRN

## 2019-04-25 MED ORDER — TRAMADOL HCL 50 MG PO TABS
50.0000 mg | ORAL_TABLET | Freq: Two times a day (BID) | ORAL | Status: DC | PRN
  Administered 2019-04-25 – 2019-04-27 (×2): 50 mg via ORAL
  Filled 2019-04-25 (×2): qty 1

## 2019-04-25 MED ORDER — ENOXAPARIN SODIUM 40 MG/0.4ML ~~LOC~~ SOLN
40.0000 mg | SUBCUTANEOUS | Status: DC
  Administered 2019-04-25 – 2019-04-27 (×3): 40 mg via SUBCUTANEOUS
  Filled 2019-04-25 (×3): qty 0.4

## 2019-04-25 MED ORDER — ACETAMINOPHEN 325 MG PO TABS: 650.0000 mg | ORAL_TABLET | ORAL | Status: DC | PRN

## 2019-04-25 MED ORDER — STROKE: EARLY STAGES OF RECOVERY BOOK
Freq: Once | Status: AC
  Administered 2019-04-25: 1
  Filled 2019-04-25: qty 1

## 2019-04-25 MED ORDER — CLOPIDOGREL BISULFATE 75 MG PO TABS
75.0000 mg | ORAL_TABLET | Freq: Every day | ORAL | Status: DC
  Administered 2019-04-26 – 2019-04-28 (×3): 75 mg via ORAL
  Filled 2019-04-25 (×3): qty 1

## 2019-04-25 MED ORDER — SENNOSIDES-DOCUSATE SODIUM 8.6-50 MG PO TABS: 2.0000 | ORAL_TABLET | Freq: Every day | ORAL | Status: DC | PRN

## 2019-04-25 NOTE — H&P (Signed)
History and Physical:    Connie Oconnor   UJW:[id number] DOB: 04-22-1941 DOA: 04/25/2019  Referring MD/provider: Dr Ranae PalmsYelverton PCP: Heide ScalesNelson, Kristen M, PA-C   Patient coming from: Home  Chief Complaint: "I think I had a stroke at lunch today".  History of Present Illness:   Connie Oconnor is an 78 y.o. female with past medical history significant for traumatic SAH in 2018, HTN and acute stroke in April 2020 which presented with left-sided weakness.  She had been treated with aspirin and Plavix however she did have an acute GI bleed during that hospital stay and EGD showed a gastric ulcer/erosive gastropathy she has been treated with PPI and Carafate.   She presented to ED yesterday because of slurred speech and chest pain.  She was noted to be markedly hypertensive.  She underwent an MRI which showed a punctate acute to subacute infarct in the periventricular white matter.  However patient's slurred speech resolved and she felt like she was back to baseline.  Since patient was already on aspirin and Plavix patient was discharged home to follow-up with neurology as an outpatient.  Patient now presents with an apparent episode of transient alteration consciousness where she became unresponsive followed by right arm weakness.  Blood pressure at the time was noted to be markedly elevated by EMS and patient was brought to ED.    Patient herself has no memory of the event.  History is entirely per ED documentation.  Patient is aware that she was told that she possibly had another stroke.  At present patient denies feeling confused.  She states she can move all of her arms and legs without difficulty.  She denies difficulty with comprehension or with speech.  Patient is not entirely sure that she is quite back to baseline as she feels not quite right.  Patient denies recent fevers or chills.  She denies any chest pain today.  No palpitations.  No shortness of breath.  ED Course:  The patient  underwent a CT which was negative for any acute findings.  She was reevaluated by neurology who notes that patient has a 7 mm right MCA aneurysm.  After evaluation by them, they believe that patient most likely had a seizure.  They are recommending admission for EEG and starting Keppra.  ROS:   ROS   Review of Systems: General: No fever, chills, weight changes Skin: No rashes, lesions, wounds Eyes: no discharge, redness, pain HENT: no ear pain, hearing loss, drainage, tinnitus Endocrine: no heat/cold intolerance, no polyuria Respiratory: Positive cough,, shortness of breath, hemoptysis Cardiovascular: No palpitations, chest pain GI: No nausea, vomiting, diarrhea, constipation GU: No dysuria, increased frequency CNS: No numbness, dizziness, headache Musculoskeletal: No back pain, joint pain Blood/lymphatics: No easy bruising, bleeding Mood/affect: No anxiety/depression    Past Medical History:   Past Medical History:  Diagnosis Date  . Anemia    hx of  . Arthritis   . Dislocation of right knee with medial meniscus tear   . Headache    hx of migraines none since 1990's  . Hypothyroidism    1978, no problems since  . Pneumonia 1990's none since  . PVC (premature ventricular contraction)    199'0's none since  . SAH (subarachnoid hemorrhage) (HCC) 2016   On the right  . Stroke [facility](HCC)     Past Surgical History:   Past Surgical History:  Procedure Laterality Date  . BACK SURGERY    . CERVICAL LAMINECTOMY  1986  . DILATION  AND CURETTAGE OF UTERUS  1970  . ELBOW SURGERY Right 1993  . ESOPHAGOGASTRODUODENOSCOPY N/A 03/20/2019   Procedure: ESOPHAGOGASTRODUODENOSCOPY (EGD);  Surgeon: Tressia DanasBeavers, Kimberly, MD;  [facility];  Service: Gastroenterology;  Laterality: N/A;  . IR ANGIO INTRA EXTRACRAN SEL COM CAROTID INNOMINATE BILAT MOD SED  02/22/2019  . IR ANGIO VERTEBRAL SEL SUBCLAVIAN INNOMINATE UNI L MOD SED  02/22/2019  . IR ANGIO VERTEBRAL SEL VERTEBRAL UNI R MOD SED   02/22/2019  . KNEE ARTHROSCOPY WITH SUBCHONDROPLASTY Right 01/11/2018   Procedure: KNEE ARTHROSCOPY WITH SUBCHONDROPLASTY, PARTIAL MEDIAL MENISCECTOMY, CHONDROPLASTY OF THE MEDIAL PATELLA FEMORAL CHONDYL, MEDIAL PLICA  EXCISION;  Surgeon: Jodi GeraldsGraves, John, MD;  Location: [facility] ORS;  Service: Orthopedics;  Laterality: Right;  . LUMBAR LAMINECTOMY  1993   rods placed bone generated removed one year later   . MULTIPLE TOOTH EXTRACTIONS    . SHOULDER SURGERY Left    arthritis clean out and shortened collar bone dr Renae Ficklepaul  . TONSILLECTOMY  1970  . VAGINAL HYSTERECTOMY  1971   partial dr Dewaine Congerbarker  . WISDOM TOOTH EXTRACTION      Social History:   Social History   Socioeconomic History  . Marital status: Widowed    Spouse name: Not on file  . Number of children: Not on file  . Years of education: Not on file  . Highest education level: Not on file  Occupational History  . Occupation: retired  Engineer, productionocial Needs  . Financial resource strain: Not on file  . Food insecurity    Worry: Not on file    Inability: Not on file  . Transportation needs    Medical: Not on file    Non-medical: Not on file  Tobacco Use  . Smoking status: Current Every Day Smoker    Packs/day: 1.00    Years: 72.00    Pack years: 72.00    Types: Cigarettes  . Smokeless tobacco: Never Used  Substance and Sexual Activity  . Alcohol use: No  . Drug use: No  . Sexual activity: Not on file  Lifestyle  . Physical activity    Days per week: Not on file    Minutes per session: Not on file  . Stress: Not on file  Relationships  . Social Musicianconnections    Talks on phone: Not on file    Gets together: Not on file    Attends religious service: Not on file    Active member of club or organization: Not on file    Attends meetings of clubs or organizations: Not on file    Relationship status: Not on file  . Intimate partner violence    Fear of current or ex partner: Not on file    Emotionally abused: Not on file    Physically  abused: Not on file    Forced sexual activity: Not on file  Other Topics Concern  . Not on file  Social History Narrative  . Not on file    Allergies   Lactose intolerance (gi), Aspirin, Ensure [nutritional supplements], Ibuprofen, and Penicillins  Family history:   Family History  Problem Relation Age of Onset  . Stroke Mother   . Heart attack Father     Current Medications:   Prior to Admission medications   Medication Sig Start Date End Date Taking? Authorizing Provider  acetaminophen (TYLENOL) 325 MG tablet Take 2 tablets (650 mg total) by mouth every 4 (four) hours as needed for mild pain (or temp > 37.5 C (99.5 F)). Patient  taking differently: Take 650 mg by mouth See admin instructions. Take 650 mg by mouth three times a day and 650 mg every four hours as needed for mild pain or temp > 99.5 F- NOT TO EXCEED 4,000 MG IN HOURS 03/27/19  Yes Angiulli, Mcarthur Rossetti, PA-C  atorvastatin (LIPITOR) 40 MG tablet Take 1 tablet (40 mg total) by mouth daily at 6 PM. 02/23/19  Yes Azucena Fallen, MD  clopidogrel (PLAVIX) 75 MG tablet Take 1 tablet (75 mg total) by mouth daily. 02/24/19  Yes Azucena Fallen, MD  diclofenac sodium (VOLTAREN) 1 % GEL Apply 2 g topically 4 (four) times daily. Patient taking differently: Apply 2 g topically See admin instructions. Apply 2 grams to right hip four times a day 03/27/19  Yes Angiulli, Mcarthur Rossetti, PA-C  docusate sodium (COLACE) 100 MG capsule Take 100 mg by mouth daily as needed for mild constipation.    Yes [provider]  ferrous sulfate 325 (65 FE) MG EC tablet Take 325 mg by mouth daily with breakfast.   Yes [provider]  gabapentin (NEURONTIN) 100 MG capsule Take 100 mg by mouth 3 (three) times daily.   Yes [provider]  losartan (COZAAR) 25 MG tablet Take 1 tablet (25 mg total) by mouth daily. 03/27/19  Yes Angiulli, Mcarthur Rossetti, PA-C  magnesium oxide (MAG-OX) 400 MG tablet Take 400 mg by mouth 2 (two) times  daily.    Yes [provider]  Melatonin 3 MG TABS Take 6 mg by mouth at bedtime.   Yes [provider]  Multiple Vitamins-Minerals (MULTIVITAMINS THER. W/MINERALS) TABS tablet Take 1 tablet by mouth daily with breakfast.   Yes [provider]  pantoprazole (PROTONIX) 40 MG tablet Take 1 tablet (40 mg total) by mouth 2 (two) times daily before a meal. Patient taking differently: Take 40 mg by mouth 2 (two) times daily before a meal. FOR 8 WEEKS- 04/09/2019 through 05/22/2019 03/27/19  Yes Angiulli, Mcarthur Rossetti, PA-C  polyethylene glycol (MIRALAX / GLYCOLAX) 17 g packet Take 17 g by mouth 2 (two) times daily. Patient taking differently: Take 17 g by mouth 2 (two) times daily as needed for mild constipation (and hold for loose stools).  03/27/19  Yes Angiulli, Mcarthur Rossetti, PA-C  senna-docusate (SENNA-PLUS) 8.6-50 MG tablet Take 2 tablets by mouth daily as needed for mild constipation.    Yes [provider]  sertraline (ZOLOFT) 25 MG tablet Take 25 mg by mouth daily.   Yes [provider]  sucralfate (CARAFATE) 1 g tablet Take 1 g by mouth 4 (four) times daily. FOR 4 WEEKS 04/13/19 05/07/19 Yes [provider]  traMADol (ULTRAM) 50 MG tablet Take 50 mg by mouth 2 (two) times daily as needed (for pain).   Yes [provider]    Physical Exam:   Vitals:   04/25/19 1600 04/25/19 1615 04/25/19 1630 04/25/19 1645  BP: (!) 131/55 (!) 139/55 (!) 147/59 127/62  Pulse: 66 66 67 68  Resp: Temp:      TempSrc:      SpO2: 95% 95% 96% 95%  Weight:      Height:         Physical Exam: Blood pressure 127/62, pulse 68, temperature 98.3 F (36.8 C), temperature source Oral, resp. rate 19, height  (1.651 m), weight 64.4 kg, SpO2 95 %. Gen: Tired appearing female sitting up at 40 degrees looking somewhat confused and frail.  Patient is  able to speak in full sentences and is coherent and cooperative. Eyes: Sclerae anicteric. Conjunctiva  mildly injected. Neck: Supple, no jugular venous distention. Chest: Moderately good air entry bilaterally with no adventitious sounds.  CV: Distant, regular, no audible murmurs. Abdomen: NABS, soft, nondistended, nontender. No tenderness to light or deep palpation. No rebound, no guarding. Extremities: No edema.  Skin: Patient has boots on to protect against heel ulcers bilaterally. Neuro: Per neurology   Data Review:    Labs: Basic Metabolic Panel: Recent Labs  Lab 04/24/19 1140 04/25/19 1600  NA 135 133*  K 3.3* 3.3*  CL 100 101  CO2 26 25  GLUCOSE 103* 121*  BUN 5* 6*  CREATININE 0.48 0.47  CALCIUM 7.9* 8.0*   Liver Function Tests: Recent Labs  Lab 04/25/19 1600  AST 17  ALT 21  ALKPHOS 90  BILITOT 0.5  PROT 5.5*  ALBUMIN 1.7*   No results for input(s): LIPASE, AMYLASE in the last 168 hours. No results for input(s): AMMONIA in the last 168 hours. CBC: Recent Labs  Lab 04/24/19 1140 04/25/19 1600  WBC 5.7 6.3  NEUTROABS 4.5 4.7  HGB 8.4* 7.7*  HCT 28.4* 26.4*  MCV 82.6 82.5  PLT 607* 589*   Cardiac Enzymes: No results for input(s): CKTOTAL, CKMB, CKMBINDEX, TROPONINI in the last 168 hours.  BNP (last 3 results) No results for input(s): PROBNP in the last 8760 hours. CBG: No results for input(s): GLUCAP in the last 168 hours.  Urinalysis    Component Value Date/Time   COLORURINE YELLOW 03/13/2019 1545   APPEARANCEUR CLOUDY (A) 03/13/2019 1545   LABSPEC 1.014 03/13/2019 1545   PHURINE 7.0 03/13/2019 1545   GLUCOSEU NEGATIVE 03/13/2019 1545   HGBUR NEGATIVE 03/13/2019 1545   BILIRUBINUR NEGATIVE 03/13/2019 1545   KETONESUR NEGATIVE 03/13/2019 1545   PROTEINUR NEGATIVE 03/13/2019 1545   NITRITE NEGATIVE 03/13/2019 1545   LEUKOCYTESUR NEGATIVE 03/13/2019 1545      Radiographic Studies: Dg Chest 2 View  Result Date: 04/24/2019 CLINICAL DATA:  Chest pain EXAM: CHEST - 2 VIEW COMPARISON:  03/13/2019 FINDINGS: Upper normal heart size.  Mediastinal contours and pulmonary vascularity normal. Bronchitic changes and chronic accentuation of perihilar markings, stable. Mild LEFT basilar atelectasis and question minimal LEFT pleural effusion No acute infiltrate, RIGHT pleural effusion or pneumothorax. Bones demineralized. IMPRESSION: Mild RIGHT basilar atelectasis and question minimal LEFT pleural effusion. Chronic bronchitic changes. Electronically Signed   By: Ulyses Southward M.D.   On: 04/24/2019 11:39   Ct Head Wo Contrast  Result Date: 04/25/2019 CLINICAL DATA:  Altered level of consciousness. Small left parietal white matter infarct seen on recent MRI. EXAM: CT HEAD WITHOUT CONTRAST TECHNIQUE: Contiguous axial images were obtained from the base of the skull through the vertex without intravenous contrast. COMPARISON:  04/24/2019 CT and MRI FINDINGS: Brain: Negative for acute hemorrhage, mass lesion, midline shift or hydrocephalus or large new infarct. Again noted is extensive low density in the white matter. Again noted is a focal area of low density in the right parietal white matter tract compatible with old infarct or ischemia. Vascular: No hyperdense vessel or unexpected calcification. Skull: Normal. Negative for fracture or focal lesion. Sinuses/Orbits: No significant sinus disease. Other: None IMPRESSION: 1. No acute intracranial abnormality. 2. Chronic white matter disease is similar to the recent comparison examination. Findings are suggestive for chronic small vessel ischemic changes. Electronically Signed   By: Richarda Overlie M.D.   On: 04/25/2019 17:13   Ct Head Wo Contrast  Result Date: 04/24/2019 CLINICAL DATA:  Ct head wo, complaint of sudden onset chest discomfort slurred speech and slowed mentation. This lasted for a short period of time and then resolved. Patient does not remember anything that happened. She was sent to rule out a.*comment was truncated*Altered level of consciousness (LOC), unexplained EXAM: CT HEAD WITHOUT  CONTRAST TECHNIQUE: Contiguous axial images were obtained from the base of the skull through the vertex without intravenous contrast. COMPARISON:  CT 03/29/2017 FINDINGS: Brain: No acute intracranial hemorrhage. No focal mass lesion. No CT evidence of acute infarction. No midline shift or mass effect. No hydrocephalus. Basilar cisterns are patent. There are periventricular and subcortical white matter hypodensities. Focal deep white matter infarction in the RIGHT centrum semiovale. Generalized cortical atrophy. Vascular: No hyperdense vessel or unexpected calcification. Skull: Normal. Negative for fracture or focal lesion. Sinuses/Orbits: No acute finding. Other: None. IMPRESSION: 1. No acute intracranial findings. 2. Atrophy and chronic white matter microvascular disease Electronically Signed   By: Genevive BiStewart  Edmunds M.D.   On: 04/24/2019 16:37   Mr Brain Wo Contrast  Result Date: 04/24/2019 CLINICAL DATA:  Slurred speech and altered mentation. EXAM: MRI HEAD WITHOUT CONTRAST TECHNIQUE: Multiplanar, multiecho pulse sequences of the brain and surrounding structures were obtained without intravenous contrast. COMPARISON:  Head CT 04/24/2019 and MRI/MRA 02/20/2019 FINDINGS: Brain: There is a punctate focus of trace diffusion hyperintensity in the white matter lateral to the atrium of the left lateral ventricle on both axial and coronal acquisitions. No definite reduced ADC is identified, however assessment is limited by its small size. There is a small amount of residual diffusion abnormality in the right corona radiata at the site of the acute infarct on the prior MRI. Background patchy and confluent T2 hyperintensities in the bilateral cerebral white matter and pons are similar to the prior MRI and nonspecific but compatible with severe chronic small vessel ischemic disease. Chronic lacunar infarcts are again seen in the bilateral basal ganglia, thalami, and corpus callosum. There is moderate cerebral atrophy.  Vascular: Major intracranial vascular flow voids are preserved. Known 7 mm right MCA bifurcation aneurysm. Skull and upper cervical spine: Unremarkable bone marrow signal. Sinuses/Orbits: Unremarkable orbits. Clear paranasal sinuses. Trace bilateral mastoid fluid. Other: None. IMPRESSION: 1. 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 2. Severe chronic small vessel ischemic disease. Electronically Signed   By: Sebastian AcheAllen  Grady M.D.   On: 04/24/2019 17:50    EKG: Independently reviewed.  Sinus rhythm at 67.  Occasional PAC.  Normal intervals.  Normal axis.  Q waves V1 and V2.  No acute ST-T wave changes.   Assessment/Plan:   Principal Problem:   Stroke [facility](HCC) Active Problems:   Essential hypertension   History of traumatic brain injury   Vascular dementia without behavioral disturbance (HCC)   HLD (hyperlipidemia)   [age] year old female who had a stroke in April 2020 complicated by GI bleed was discharged to rehab on aspirin and Plavix as well as PPI and Carafate.  She returned yesterday with transient aphasia with a head CT that showed possible acute punctate periventricular white matter lesion.  Patient had apparently returned back to baseline and was discharged home on her Plavix.  She now presents with an episode of transient duration consciousness that is thought to be a seizure by neurology.  SEIZURE EEG ordered per neurology Keppra also started by them I have not repeated an echocardiogram as she had 1 2 months ago.  ANEMIA/ H/O GI BLEED  With known history of  recent GI bleed secondary to erosive gastritis April 2020 on Protonix and Carafate who is maintained on Plavix for secondary stroke prevention.  Hemoglobin has decreased from 8.4-7.7 over the last 20 days however patient is hemodynamically stable. Would follow H&H closely.   Hold losartan until we can establish that H&H is stabilized.  STROKE Continue Plavix per home doses, patient with known history of GI  bleed in past Continue atorvastatin  HYPOKALEMIA We will replete and recheck  HTN Hold losartan for now in case she is having a GI bleed. This can be restarted once her blood pressures normalized.   Other information:   DVT prophylaxis: Lovenox ordered. Code Status: Full code. Family Communication: Spoke with patients daughters Rafael Bihari Disposition Plan: home Consults called: Neurology Admission status: Admission  The medical decision making on this patient was of high complexity and the patient is at high risk for clinical deterioration, therefore this is a level 3 visit.  Dewaine Oats Tublu  [facility]  If 7PM-7AM, please contact night-coverage [email or web address] Password [facility] 04/25/2019, 6:59 PM

## 2019-04-25 NOTE — ED Notes (Signed)
Attempted report x 3.  

## 2019-04-25 NOTE — ED Notes (Signed)
Patient transported to CT 

## 2019-04-25 NOTE — ED Notes (Signed)
ED TO INPATIENT HANDOFF REPORT  ED Nurse Name and Phone #: Jeannett SeniorStephen, RN   S Name/Age/Gender Connie Oconnor 78 y.o. female Room/Bed: 016C/016C  Code Status   Code Status: Prior  Home/SNF/Other Skilled nursing facility Patient oriented to: self, place, time and situation Is this baseline? Yes   Triage Complete: Triage complete  Chief Complaint Stroke like  [facility]. 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 Ems arrived around 130pm and stated all those symptoms have resolved. Pt a.o, nad noted, denies any complaints.    Allergies Allergies  Allergen Reactions  . Lactose Intolerance (Gi) Diarrhea  . Aspirin Other (See Comments)    shaking  . Ensure [Nutritional Supplements] Diarrhea  . Ibuprofen Other (See Comments)    Causes "shaking"  . Penicillins Rash and Other (See Comments)    Has patient had a PCN reaction causing immediate rash, facial/tongue/throat swelling, SOB or lightheadedness with hypotension: No Has patient had a PCN reaction causing severe rash involving mucus membranes or skin necrosis: No Has patient had a PCN reaction that required hospitalization: Was already in hospital Has patient had a PCN reaction occurring within the last 10 years: No If all of the above answers are "NO", then may proceed with Cephalosporin use.     Level of Care/Admitting Diagnosis ED Disposition    ED Disposition Condition Comment   Admit  The patient appears reasonably stabilized for admission considering the current resources, flow, and capabilities available in the ED at this time, and I doubt any other [facility] requiring further screening and/or treatment in the ED prior to admission is  present.       B Medical/Surgery History Past Medical History:   Diagnosis Date  . Anemia    hx of  . Arthritis   . Dislocation of right knee with medial meniscus tear   . Headache    hx of migraines none since 1990's  . Hypothyroidism    1978, no problems since  . Pneumonia 1990's none since  . PVC (premature ventricular contraction)    199'0's none since  . SAH (subarachnoid hemorrhage) (HCC) 2016   On the right  . Stroke [facility](HCC)    Past Surgical History:  Procedure Laterality Date  . BACK SURGERY    . CERVICAL LAMINECTOMY  1986  . DILATION AND CURETTAGE OF UTERUS  1970  . ELBOW SURGERY Right 1993  . ESOPHAGOGASTRODUODENOSCOPY N/A 03/20/2019   Procedure: ESOPHAGOGASTRODUODENOSCOPY (EGD);  Surgeon: Tressia DanasBeavers, Kimberly, MD;  [facility];  Service: Gastroenterology;  Laterality: N/A;  . IR ANGIO INTRA EXTRACRAN SEL COM CAROTID INNOMINATE BILAT MOD SED  02/22/2019  . IR ANGIO VERTEBRAL SEL SUBCLAVIAN INNOMINATE UNI L MOD SED  02/22/2019  . IR ANGIO VERTEBRAL SEL VERTEBRAL UNI R MOD SED  02/22/2019  . KNEE ARTHROSCOPY WITH SUBCHONDROPLASTY Right 01/11/2018   Procedure: KNEE ARTHROSCOPY WITH SUBCHONDROPLASTY, PARTIAL MEDIAL MENISCECTOMY, CHONDROPLASTY OF THE MEDIAL PATELLA FEMORAL CHONDYL, MEDIAL PLICA  EXCISION;  Surgeon: Jodi GeraldsGraves, John, MD;  Location: [facility] ORS;  Service: Orthopedics;  Laterality: Right;  . LUMBAR LAMINECTOMY  1993   rods placed bone generated removed one year later   . MULTIPLE TOOTH EXTRACTIONS    . SHOULDER SURGERY Left    arthritis clean out and shortened collar bone dr Renae Ficklepaul  .  TONSILLECTOMY  1970  . VAGINAL HYSTERECTOMY  1971   partial dr Dewaine Congerbarker  . WISDOM TOOTH EXTRACTION       A IV Location/Drains/Wounds Patient Lines/Drains/Airways Status   Active Line/Drains/Airways    Name:   Placement date:   Placement time:   Site:   Days:   Peripheral IV 04/25/19 Right Wrist   04/25/19    1456    Wrist   less than 1          Intake/Output Last 24 hours No intake or output data in the 24 hours ending 04/25/19  1848  Labs/Imaging Results for orders placed or performed during the hospital encounter of 04/25/19 (from the past 48 hour(s))  CBC with Differential/Platelet     Status: Abnormal   Collection Time: 04/25/19  4:00 PM  Result Value Ref Range   WBC 6.3 4.0 - 10.5 K/uL   RBC 3.20 (L) 3.87 - 5.11 MIL/uL   Hemoglobin 7.7 (L) 12.0 - 15.0 g/dL   HCT 40.926.4 (L) 81.136.0 - 91.446.0 %   MCV 82.5 80.0 - 100.0 fL   MCH 24.1 (L) 26.0 - 34.0 pg   MCHC 29.2 (L) 30.0 - 36.0 g/dL   RDW 78.217.7 (H) 95.611.5 - 21.315.5 %   Platelets 589 (H) 150 - 400 K/uL   nRBC 0.0 0.0 - 0.2 %   Neutrophils Relative % 73 %   Neutro Abs 4.7 1.7 - 7.7 K/uL   Lymphocytes Relative 14 %   Lymphs Abs 0.9 0.7 - 4.0 K/uL   Monocytes Relative 12 %   Monocytes Absolute 0.7 0.1 - 1.0 K/uL   Eosinophils Relative 0 %   Eosinophils Absolute 0.0 0.0 - 0.5 K/uL   Basophils Relative 1 %   Basophils Absolute 0.0 0.0 - 0.1 K/uL   Immature Granulocytes 0 %   Abs Immature Granulocytes 0.02 0.00 - 0.07 K/uL    Comment: Performed at [facility], [address] [place], [place] [address]  Comprehensive metabolic panel     Status: Abnormal   Collection Time: 04/25/19  4:00 PM  Result Value Ref Range   Sodium 133 (L) 135 - 145 mmol/L   Potassium 3.3 (L) 3.5 - 5.1 mmol/L   Chloride 101 98 - 111 mmol/L   CO2 25 22 - 32 mmol/L   Glucose, Bld 121 (H) 70 - 99 mg/dL   BUN 6 (L) 8 - 23 mg/dL   Creatinine, Ser 8.460.47 0.44 - 1.00 mg/dL   Calcium 8.0 (L) 8.9 - 10.3 mg/dL   Total Protein 5.5 (L) 6.5 - 8.1 g/dL   Albumin 1.7 (L) 3.5 - 5.0 g/dL   AST 17 15 - 41 U/L   ALT 21 0 - 44 U/L   Alkaline Phosphatase 90 38 - 126 U/L   Total Bilirubin 0.5 0.3 - 1.2 mg/dL   GFR calc non Af Amer >60 >60 mL/min   GFR calc Af Amer >60 >60 mL/min   Anion gap 7 5 - 15    Comment: Performed at [facility], [address] [place], [place] [address]   Dg Chest 2 View  Result Date: 04/24/2019 CLINICAL DATA:  Chest pain EXAM: CHEST - 2 VIEW COMPARISON:  03/13/2019  FINDINGS: Upper normal heart size. Mediastinal contours and pulmonary vascularity normal. Bronchitic changes and chronic accentuation of perihilar markings, stable. Mild LEFT basilar atelectasis and question minimal LEFT pleural effusion No acute infiltrate, RIGHT pleural effusion or pneumothorax. Bones demineralized. IMPRESSION: Mild RIGHT basilar atelectasis  and question minimal LEFT pleural effusion. Chronic bronchitic changes. Electronically Signed   By: Lavonia Dana M.D.   On: 04/24/2019 11:39   Ct Head Wo Contrast  Result Date: 04/25/2019 CLINICAL DATA:  Altered level of consciousness. Small left parietal white matter infarct seen on recent MRI. EXAM: CT HEAD WITHOUT CONTRAST TECHNIQUE: Contiguous axial images were obtained from the base of the skull through the vertex without intravenous contrast. COMPARISON:  04/24/2019 CT and MRI FINDINGS: Brain: Negative for acute hemorrhage, mass lesion, midline shift or hydrocephalus or large new infarct. Again noted is extensive low density in the white matter. Again noted is a focal area of low density in the right parietal white matter tract compatible with old infarct or ischemia. Vascular: No hyperdense vessel or unexpected calcification. Skull: Normal. Negative for fracture or focal lesion. Sinuses/Orbits: No significant sinus disease. Other: None IMPRESSION: 1. No acute intracranial abnormality. 2. Chronic white matter disease is similar to the recent comparison examination. Findings are suggestive for chronic small vessel ischemic changes. Electronically Signed   By: Markus Daft M.D.   On: 04/25/2019 17:13   Ct Head Wo Contrast  Result Date: 04/24/2019 CLINICAL DATA:  Ct head wo, complaint of sudden onset chest discomfort slurred speech and slowed mentation. This lasted for a short period of time and then resolved. Patient does not remember anything that happened. She was sent to rule out a.*comment was truncated*Altered level of consciousness (LOC),  unexplained EXAM: CT HEAD WITHOUT CONTRAST TECHNIQUE: Contiguous axial images were obtained from the base of the skull through the vertex without intravenous contrast. COMPARISON:  CT 03/29/2017 FINDINGS: Brain: No acute intracranial hemorrhage. No focal mass lesion. No CT evidence of acute infarction. No midline shift or mass effect. No hydrocephalus. Basilar cisterns are patent. There are periventricular and subcortical white matter hypodensities. Focal deep white matter infarction in the RIGHT centrum semiovale. Generalized cortical atrophy. Vascular: No hyperdense vessel or unexpected calcification. Skull: Normal. Negative for fracture or focal lesion. Sinuses/Orbits: No acute finding. Other: None. IMPRESSION: 1. No acute intracranial findings. 2. Atrophy and chronic white matter microvascular disease Electronically Signed   By: Suzy Bouchard M.D.   On: 04/24/2019 16:37   Mr Brain Wo Contrast  Result Date: 04/24/2019 CLINICAL DATA:  Slurred speech and altered mentation. EXAM: MRI HEAD WITHOUT CONTRAST TECHNIQUE: Multiplanar, multiecho pulse sequences of the brain and surrounding structures were obtained without intravenous contrast. COMPARISON:  Head CT 04/24/2019 and MRI/MRA 02/20/2019 FINDINGS: Brain: There is a punctate focus of trace diffusion hyperintensity in the white matter lateral to the atrium of the left lateral ventricle on both axial and coronal acquisitions. No definite reduced ADC is identified, however assessment is limited by its small size. There is a small amount of residual diffusion abnormality in the right corona radiata at the site of the acute infarct on the prior MRI. Background patchy and confluent T2 hyperintensities in the bilateral cerebral white matter and pons are similar to the prior MRI and nonspecific but compatible with severe chronic small vessel ischemic disease. Chronic lacunar infarcts are again seen in the bilateral basal ganglia, thalami, and corpus callosum. There  is moderate cerebral atrophy. Vascular: Major intracranial vascular flow voids are preserved. Known 7 mm right MCA bifurcation aneurysm. Skull and upper cervical spine: Unremarkable bone marrow signal. Sinuses/Orbits: Unremarkable orbits. Clear paranasal sinuses. Trace bilateral mastoid fluid. Other: None. IMPRESSION: 1. 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 2. Severe chronic small vessel ischemic disease. Electronically Signed  By: Sebastian AcheAllen  Grady M.D.   On: 04/24/2019 17:50    Pending Labs Unresulted Labs (From admission, onward)    Start     Ordered   04/25/19 1629  Novel Coronavirus, NAA (hospital order; send-out to ref lab)  Once,   STAT    Comments: No isolation needed for this testing (if isolation ordered for another indication, maintain current isolation).   Question:  Required for discharge to:  Answer:  SNF/facility placement   04/25/19 1628          Vitals/Pain Today's Vitals   04/25/19 1602 04/25/19 1615 04/25/19 1630 04/25/19 1645  BP:  (!) 139/55 (!) 147/59 127/62  Pulse:  66 67 68  Resp:  16 17 19   Temp:      TempSrc:      SpO2:  95% 96% 95%  Weight:      Height:      PainSc: 0-No pain       Isolation Precautions No active isolations  Medications Medications - No data to display  Mobility non-ambulatory High fall risk   Focused Assessments Neuro Assessment Handoff:  Swallow screen pass? Yes  Cardiac Rhythm: Normal sinus rhythm NIH Stroke Scale ( + Modified Stroke Scale Criteria)  LOC Questions (1b. )   +: Answers both questions correctly LOC Commands (1c. )   + : Performs both tasks correctly Best Gaze (2. )  +: Normal Visual (3. )  +: No visual loss Motor Arm, Left (5a. )   +: Some effort against gravity Motor Arm, Right (5b. )   +: No drift Motor Leg, Left (6a. )   +: Some effort against gravity Motor Leg, Right (6b. )   +: Some effort against gravity Sensory (8. )   +: Normal, no sensory loss Best Language (9. )   +:  No aphasia Extinction/Inattention (11.)   +: No Abnormality Modified SS Total  +: 6     Neuro Assessment: Exceptions to WDL Neuro Checks:      Last Documented NIHSS Modified Score: 6 (04/25/19 1511) Has TPA been given? No If patient is a Neuro Trauma and patient is going to OR before floor call report to 4N Charge nurse: [phone] or [phone]     R Recommendations: See Admitting Provider Note  Report given to:   Additional Notes:

## 2019-04-25 NOTE — ED Notes (Signed)
Attempted report 

## 2019-04-25 NOTE — ED Notes (Signed)
Attempted report x2, phone was hung up.

## 2019-04-25 NOTE — ED Notes (Signed)
Notified family of pt status and plan of care. Patient's daughter's phone number [phone]

## 2019-04-25 NOTE — Telephone Encounter (Signed)
I called Juliann Pulse that pt has appt on Tuesday for video visit.She stated pt is in hospital and will call us back if she does not get discharge to r/s.

## 2019-04-25 NOTE — Consult Note (Signed)
Requesting Physician: Dr. Ranae PalmsYelverton    Chief Complaint: Unresponsive episode  History obtained from: Patient and Chart    HPI:                                                                                                                                       Connie MarusRebecca Oconnor is a 78 y.o. 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  Patient had return to baseline yesterday and was discharged with follow-up scheduled as outpatient on 6/30.  Patient was doing fine until around 12:50 today she had an episode where she became unresponsive, had up rolling of her eyes followed by weakness of her right arm lasting for approximately 5 minutes.  EMS was called and symptoms were resolving.  Blood pressure was 190/90 mmHg during the episode. Patient has returned to her baseline and is unaware of the event.  Work-up in the ED included CT head which redemonstrates right parietal infarct and chronic white matter disease.  Sodium is 133 potassium is 3.3.    Past Medical History:  Diagnosis Date  . Anemia    hx of  . Arthritis   . Dislocation of right knee with medial meniscus tear   . Headache    hx of migraines none since 1990's  . Hypothyroidism    1978, no problems since  . Pneumonia 1990's none since  . PVC (premature ventricular contraction)    199'0's none since  . SAH (subarachnoid hemorrhage) (HCC) 2016   On the right  . Stroke [facility](HCC)     Past Surgical History:  Procedure Laterality Date  . BACK SURGERY    . CERVICAL LAMINECTOMY  1986  . DILATION AND CURETTAGE OF UTERUS  1970  . ELBOW  SURGERY Right 1993  . ESOPHAGOGASTRODUODENOSCOPY N/A 03/20/2019   Procedure: ESOPHAGOGASTRODUODENOSCOPY (EGD);  Surgeon: Tressia DanasBeavers, Kimberly, MD;  [facility];  Service: Gastroenterology;  Laterality: N/A;  . IR ANGIO INTRA EXTRACRAN SEL COM CAROTID INNOMINATE BILAT MOD SED  02/22/2019  . IR ANGIO VERTEBRAL SEL SUBCLAVIAN INNOMINATE UNI L MOD SED  02/22/2019  . IR ANGIO VERTEBRAL SEL VERTEBRAL UNI R MOD SED  02/22/2019  . KNEE ARTHROSCOPY WITH SUBCHONDROPLASTY Right 01/11/2018   Procedure: KNEE ARTHROSCOPY WITH SUBCHONDROPLASTY, PARTIAL MEDIAL MENISCECTOMY, CHONDROPLASTY OF THE MEDIAL PATELLA FEMORAL CHONDYL, MEDIAL PLICA  EXCISION;  Surgeon: Jodi GeraldsGraves, John, MD;  Location: [facility];  Service: Orthopedics;  Laterality: Right;  . LUMBAR LAMINECTOMY  1993   rods placed bone generated removed one year later   . MULTIPLE TOOTH EXTRACTIONS    . SHOULDER SURGERY  Left    arthritis clean out and shortened collar bone dr Renae Ficklepaul  . TONSILLECTOMY  1970  . VAGINAL HYSTERECTOMY  1971   partial dr Dewaine Congerbarker  . WISDOM TOOTH EXTRACTION      Family History  Problem Relation Age of Onset  . Stroke Mother   . Heart attack Father    Social History:  reports that she has been smoking cigarettes. She has a 72.00 pack-year smoking history. She has never used smokeless tobacco. She reports that she does not drink alcohol or use drugs.  Allergies:  Allergies  Allergen Reactions  . Lactose Intolerance (Gi) Diarrhea  . Aspirin Other (See Comments)    shaking  . Ensure [Nutritional Supplements] Diarrhea  . Ibuprofen Other (See Comments)    Causes "shaking"  . Penicillins Rash and Other (See Comments)    Has patient had a PCN reaction causing immediate rash, facial/tongue/throat swelling, SOB or lightheadedness with hypotension: No Has patient had a PCN reaction causing severe rash involving mucus membranes or skin necrosis: No Has patient had a PCN reaction that required hospitalization: Was already in  hospital Has patient had a PCN reaction occurring within the last 10 years: No If all of the above answers are "NO", then may proceed with Cephalosporin use.     Medications:                                                                                                                        I reviewed home medications   ROS:                                                                                                                                     14 systems reviewed and negative except above    Examination:                                                                                                      General: Appears well-developed  Psych: Affect  appropriate to situation Eyes: No scleral injection HENT: No OP obstrucion Head: Normocephalic.  Cardiovascular: Normal rate and regular rhythm. Respiratory: Effort normal and breath sounds normal to anterior ascultation GI: Soft.  No distension. There is no tenderness.  Skin: WDI    Neurological Examination Mental Status: Alert, oriented, thought content appropriate.  Speech fluent without evidence of aphasia. Able to follow 3 step commands without difficulty. Cranial Nerves: II: Visual fields grossly normal,  III,IV, VI: ptosis not present, extra-ocular motions intact bilaterally, pupils equal, round, reactive to light and accommodation V,VII: smile symmetric, facial light touch sensation normal bilaterally VIII: hearing normal bilaterally IX,X: uvula rises symmetrically XI: bilateral shoulder shrug XII: midline tongue extension Motor: Right : Upper extremity   5/5    Left:     Upper extremity   4/5  Lower extremity   5/5     Lower extremity   3/5, bent at the knee Tone and bulk:normal tone throughout; no atrophy noted Sensory: Pinprick and light touch intact throughout, bilaterally Plantars: Right: downgoing   Left: downgoing Cerebellar: normal finger-to-nose      Lab Results: Basic Metabolic  Panel: Recent Labs  Lab 04/24/19 1140 04/25/19 1600  NA 135 133*  K 3.3* 3.3*  CL 100 101  CO2 26 25  GLUCOSE 103* 121*  BUN 5* 6*  CREATININE 0.48 0.47  CALCIUM 7.9* 8.0*    CBC: Recent Labs  Lab 04/24/19 1140 04/25/19 1600  WBC 5.7 6.3  NEUTROABS 4.5 4.7  HGB 8.4* 7.7*  HCT 28.4* 26.4*  MCV 82.6 82.5  PLT 607* 589*    Coagulation Studies: No results for input(s): LABPROT, INR in the last 72 hours.  Imaging: Dg Chest 2 View  Result Date: 04/24/2019 CLINICAL DATA:  Chest pain EXAM: CHEST - 2 VIEW COMPARISON:  03/13/2019 FINDINGS: Upper normal heart size. Mediastinal contours and pulmonary vascularity normal. Bronchitic changes and chronic accentuation of perihilar markings, stable. Mild LEFT basilar atelectasis and question minimal LEFT pleural effusion No acute infiltrate, RIGHT pleural effusion or pneumothorax. Bones demineralized. IMPRESSION: Mild RIGHT basilar atelectasis and question minimal LEFT pleural effusion. Chronic bronchitic changes. Electronically Signed   By: Ulyses SouthwardMark  Boles M.D.   On: 04/24/2019 11:39   Ct Head Wo Contrast  Result Date: 04/25/2019 CLINICAL DATA:  Altered level of consciousness. Small left parietal white matter infarct seen on recent MRI. EXAM: CT HEAD WITHOUT CONTRAST TECHNIQUE: Contiguous axial images were obtained from the base of the skull through the vertex without intravenous contrast. COMPARISON:  04/24/2019 CT and MRI FINDINGS: Brain: Negative for acute hemorrhage, mass lesion, midline shift or hydrocephalus or large new infarct. Again noted is extensive low density in the white matter. Again noted is a focal area of low density in the right parietal white matter tract compatible with old infarct or ischemia. Vascular: No hyperdense vessel or unexpected calcification. Skull: Normal. Negative for fracture or focal lesion. Sinuses/Orbits: No significant sinus disease. Other: None IMPRESSION: 1. No acute intracranial abnormality. 2. Chronic white  matter disease is similar to the recent comparison examination. Findings are suggestive for chronic small vessel ischemic changes. Electronically Signed   By: Richarda OverlieAdam  Henn M.D.   On: 04/25/2019 17:13   Ct Head Wo Contrast  Result Date: 04/24/2019 CLINICAL DATA:  Ct head wo, complaint of sudden onset chest discomfort slurred speech and slowed mentation. This lasted for a short period of time and then resolved. Patient does not remember anything that happened. She was sent to rule out a.*comment was  truncated*Altered level of consciousness (LOC), unexplained EXAM: CT HEAD WITHOUT CONTRAST TECHNIQUE: Contiguous axial images were obtained from the base of the skull through the vertex without intravenous contrast. COMPARISON:  CT 03/29/2017 FINDINGS: Brain: No acute intracranial hemorrhage. No focal mass lesion. No CT evidence of acute infarction. No midline shift or mass effect. No hydrocephalus. Basilar cisterns are patent. There are periventricular and subcortical white matter hypodensities. Focal deep white matter infarction in the RIGHT centrum semiovale. Generalized cortical atrophy. Vascular: No hyperdense vessel or unexpected calcification. Skull: Normal. Negative for fracture or focal lesion. Sinuses/Orbits: No acute finding. Other: None. IMPRESSION: 1. No acute intracranial findings. 2. Atrophy and chronic white matter microvascular disease Electronically Signed   By: Suzy Bouchard M.D.   On: 04/24/2019 16:37   Mr Brain Wo Contrast  Result Date: 04/24/2019 CLINICAL DATA:  Slurred speech and altered mentation. EXAM: MRI HEAD WITHOUT CONTRAST TECHNIQUE: Multiplanar, multiecho pulse sequences of the brain and surrounding structures were obtained without intravenous contrast. COMPARISON:  Head CT 04/24/2019 and MRI/MRA 02/20/2019 FINDINGS: Brain: There is a punctate focus of trace diffusion hyperintensity in the white matter lateral to the atrium of the left lateral ventricle on both axial and coronal  acquisitions. No definite reduced ADC is identified, however assessment is limited by its small size. There is a small amount of residual diffusion abnormality in the right corona radiata at the site of the acute infarct on the prior MRI. Background patchy and confluent T2 hyperintensities in the bilateral cerebral white matter and pons are similar to the prior MRI and nonspecific but compatible with severe chronic small vessel ischemic disease. Chronic lacunar infarcts are again seen in the bilateral basal ganglia, thalami, and corpus callosum. There is moderate cerebral atrophy. Vascular: Major intracranial vascular flow voids are preserved. Known 7 mm right MCA bifurcation aneurysm. Skull and upper cervical spine: Unremarkable bone marrow signal. Sinuses/Orbits: Unremarkable orbits. Clear paranasal sinuses. Trace bilateral mastoid fluid. Other: None. IMPRESSION: 1. 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 2. Severe chronic small vessel ischemic disease. Electronically Signed   By: Logan Bores M.D.   On: 04/24/2019 17:50     ASSESSMENT AND PLAN  79 y.o. 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  She now presents with what appears most likely to have been a seizure.   CT head was repeated showed no acute findings.  Etiology for seizure unclear at this time.   Seizure History of recent stroke   Recommendations Continue Plavix 75mg  daily Routine EEG Will start Keppra 500 mg 2 times daily Telemetry and consider discharging patient on Holter monitor Echocardiogram  Frequent  neurochecks  Rahel Carlton [facility] Pager Number [phone]

## 2019-04-25 NOTE — ED Triage Notes (Signed)
Pt from [facility] for stroke like symptoms. At [facility] today staff stated the pt became unresponsive and her eyes rolled to the back of her head and right sided arm weakness, residual left sided weakness from prior stroke.Bp was 190/90 P 66 during that time. Ems arrived around 130pm and stated all those symptoms have resolved. Pt a.o, nad noted, denies any complaints.

## 2019-04-25 NOTE — ED Provider Notes (Signed)
[facility] EMERGENCY DEPARTMENT Provider Note   CSN: [id number] Arrival date & time: 04/25/19  1451    History   Chief Complaint Chief Complaint  Patient presents with   Weakness    HPI Connie Oconnor is a 78 y.o. female.     HPI Patient with history of stroke with residual left upper and left lower extremity weakness presents by EMS from nursing home.  Reportedly at 1254 today staff noticed that the patient became unresponsive and her eyes rolled back into her head.  They noted that she had right arm weakness at the time.  Her blood pressure was 190/90.  When EMS arrived at 1300, patient symptom has completely resolved.  Patient is amnestic to the event.  Currently denies any pain. Past Medical History:  Diagnosis Date   Anemia    hx of   Arthritis    Dislocation of right knee with medial meniscus tear    Headache    hx of migraines none since 1990's   Hypothyroidism    1978, no problems since   Pneumonia 1990's none since   PVC (premature ventricular contraction)    199'0's none since   [facility] (subarachnoid hemorrhage) ([place]) 2016   On the right   Stroke [facility])     Patient Active Problem List   Diagnosis Date Noted   GI bleed 04/04/2019   HLD (hyperlipidemia) 04/04/2019   Vascular dementia without behavioral disturbance (HCC)    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    Gastric erosion    Gastrointestinal hemorrhage    Acute respiratory failure with hypoxia (HCC)    Hyponatremia    Tobacco abuse    Pulmonary emphysema (HCC)    Pulmonary nodules    Primary osteoarthritis of right hip    Subacute confusional state    Lethargy    Confusion, postoperative    Slow transit constipation    Pain of left calf    Impulsiveness    Dysphagia, post-stroke    Labile blood pressure    Hyperglycemia    Hypotension    Hypoalbuminemia due to protein-calorie malnutrition (HCC)    Acute blood  loss anemia    Hypoglycemia    Essential hypertension    History of traumatic brain injury    Lacunar infarct, acute ([place]) 02/23/2019   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   Right-sided lacunar infarction ([place]) 02/20/2019   Hypothyroidism (acquired) 02/20/2019   Closed fracture of medial portion of right tibial plateau 01/11/2018   Acute medial meniscal tear, right, initial encounter 01/11/2018   SAH (subarachnoid hemorrhage) ([place]) 03/29/2017    Past Surgical History:  Procedure Laterality Date   BACK SURGERY     CERVICAL LAMINECTOMY  1986   DILATION AND CURETTAGE OF UTERUS  1970   ELBOW SURGERY Right 1993   ESOPHAGOGASTRODUODENOSCOPY N/A 03/20/2019   Procedure: ESOPHAGOGASTRODUODENOSCOPY (EGD);  Surgeon: Thornton Park, MD;  Location: [facility];  Service: Gastroenterology;  Laterality: N/A;   IR ANGIO INTRA EXTRACRAN SEL COM CAROTID INNOMINATE BILAT MOD SED  02/22/2019   IR ANGIO VERTEBRAL SEL SUBCLAVIAN INNOMINATE UNI L MOD SED  02/22/2019   IR ANGIO VERTEBRAL SEL VERTEBRAL UNI R MOD SED  02/22/2019   KNEE ARTHROSCOPY WITH SUBCHONDROPLASTY Right 01/11/2018   Procedure: KNEE ARTHROSCOPY WITH SUBCHONDROPLASTY, PARTIAL MEDIAL MENISCECTOMY, CHONDROPLASTY OF THE MEDIAL PATELLA FEMORAL CHONDYL, MEDIAL PLICA  EXCISION;  Surgeon: Dorna Leitz, MD;  Location: [facility];  Service: Orthopedics;  Laterality: Right;   LUMBAR  LAMINECTOMY  1993   rods placed bone generated removed one year later    MULTIPLE TOOTH EXTRACTIONS     SHOULDER SURGERY Left    arthritis clean out and shortened collar bone dr Renae Fickle   TONSILLECTOMY  1970   VAGINAL HYSTERECTOMY  1971   partial dr Bunnie Domino TOOTH EXTRACTION       OB History   No obstetric history on file.      Home Medications    Prior to Admission medications   Medication Sig Start Date End Date Taking? Authorizing Provider  acetaminophen (TYLENOL) 325 MG tablet Take 2 tablets (650 mg total) by mouth  every 4 (four) hours as needed for mild pain (or temp > 37.5 C (99.5 F)). Patient taking differently: Take 650 mg by mouth See admin instructions. Take 650 mg by mouth three times a day and 650 mg every four hours as needed for mild pain or temp > 99.5 F- NOT TO EXCEED 4,000 MG IN HOURS 03/27/19  Yes Angiulli, Mcarthur Rossetti, PA-C  atorvastatin (LIPITOR) 40 MG tablet Take 1 tablet (40 mg total) by mouth daily at 6 PM. 02/23/19  Yes Azucena Fallen, MD  clopidogrel (PLAVIX) 75 MG tablet Take 1 tablet (75 mg total) by mouth daily. 02/24/19  Yes Azucena Fallen, MD  diclofenac sodium (VOLTAREN) 1 % GEL Apply 2 g topically 4 (four) times daily. Patient taking differently: Apply 2 g topically See admin instructions. Apply 2 grams to right hip four times a day 03/27/19  Yes Angiulli, Mcarthur Rossetti, PA-C  docusate sodium (COLACE) 100 MG capsule Take 100 mg by mouth daily as needed for mild constipation.    Yes [provider]  ferrous sulfate 325 (65 FE) MG EC tablet Take 325 mg by mouth daily with breakfast.   Yes [provider]  gabapentin (NEURONTIN) 100 MG capsule Take 100 mg by mouth 3 (three) times daily.   Yes [provider]  losartan (COZAAR) 25 MG tablet Take 1 tablet (25 mg total) by mouth daily. 03/27/19  Yes Angiulli, Mcarthur Rossetti, PA-C  magnesium oxide (MAG-OX) 400 MG tablet Take 400 mg by mouth 2 (two) times daily.    Yes [provider]  Melatonin 3 MG TABS Take 6 mg by mouth at bedtime.   Yes [provider]  Multiple Vitamins-Minerals (MULTIVITAMINS THER. W/MINERALS) TABS tablet Take 1 tablet by mouth daily with breakfast.   Yes [provider]  pantoprazole (PROTONIX) 40 MG tablet Take 1 tablet (40 mg total) by mouth 2 (two) times daily before a meal. Patient taking differently: Take 40 mg by mouth 2 (two) times daily before a meal. FOR 8 WEEKS- 04/09/2019 through 05/22/2019 03/27/19  Yes Angiulli, Mcarthur Rossetti, PA-C  polyethylene glycol (MIRALAX /  GLYCOLAX) 17 g packet Take 17 g by mouth 2 (two) times daily. Patient taking differently: Take 17 g by mouth 2 (two) times daily as needed for mild constipation (and hold for loose stools).  03/27/19  Yes Angiulli, Mcarthur Rossetti, PA-C  senna-docusate (SENNA-PLUS) 8.6-50 MG tablet Take 2 tablets by mouth daily as needed for mild constipation.    Yes [provider]  sertraline (ZOLOFT) 25 MG tablet Take 25 mg by mouth daily.   Yes [provider]  sucralfate (CARAFATE) 1 g tablet Take 1 g by mouth 4 (four) times daily. FOR 4 WEEKS 04/13/19 05/07/19 Yes [provider]  traMADol (ULTRAM) 50 MG tablet Take 50 mg by mouth 2 (two)  times daily as needed (for pain).   Yes [provider]    Family History Family History  Problem Relation Age of Onset   Stroke Mother    Heart attack Father     Social History Social History   Tobacco Use   Smoking status: Current Every Day Smoker    Packs/day: 1.00    Years: 72.00    Pack years: 72.00    Types: Cigarettes   Smokeless tobacco: Never Used  Substance Use Topics   Alcohol use: No   Drug use: No     Allergies   Lactose intolerance (gi), Aspirin, Ensure [nutritional supplements], Ibuprofen, and Penicillins   Review of Systems Review of Systems  Constitutional: Negative for chills and fever.  HENT: Negative for facial swelling and trouble swallowing.   Respiratory: Negative for shortness of breath.   Cardiovascular: Negative for chest pain.  Gastrointestinal: Negative for abdominal pain, nausea and vomiting.  Musculoskeletal: Negative for back pain and neck pain.  Skin: Negative for rash and wound.  Neurological: Positive for syncope and weakness. Negative for headaches.  All other systems reviewed and are negative.    Physical Exam Updated Vital Signs BP 127/62    Pulse 68    Temp 98.3 F (36.8 C) (Oral)    Resp 19    Ht 5\' 5"  (1.651 m)    Wt 64.4 kg    SpO2 95%    BMI 23.63 kg/m   Physical  Exam Vitals signs and nursing note reviewed.  Constitutional:      General: She is not in acute distress.    Appearance: Normal appearance. She is well-developed. She is not ill-appearing.  HENT:     Head: Normocephalic and atraumatic.     Comments: Question mild left lower facial weakness    Nose: Nose normal.     Mouth/Throat:     Mouth: Mucous membranes are moist.  Eyes:     Extraocular Movements: Extraocular movements intact.     Pupils: Pupils are equal, round, and reactive to light.  Neck:     Musculoskeletal: Normal range of motion and neck supple. No neck rigidity or muscular tenderness.  Cardiovascular:     Rate and Rhythm: Normal rate and regular rhythm.     Heart sounds: No murmur. No friction rub. No gallop.   Pulmonary:     Effort: Pulmonary effort is normal. No respiratory distress.     Breath sounds: Normal breath sounds. No stridor. No wheezing, rhonchi or rales.  Abdominal:     General: Bowel sounds are normal.     Palpations: Abdomen is soft.     Tenderness: There is no abdominal tenderness. There is no guarding or rebound.  Musculoskeletal: Normal range of motion.        General: No swelling, tenderness, deformity or signs of injury.     Right lower leg: No edema.     Left lower leg: No edema.  Lymphadenopathy:     Cervical: No cervical adenopathy.  Skin:    General: Skin is warm and dry.     Capillary Refill: Capillary refill takes less than 2 seconds.     Findings: No erythema or rash.  Neurological:     Mental Status: She is alert and oriented to person, place, and time.     Comments: 4/5 motor in left upper and left lower extremities.  5/5 motor right upper and right lower extremities.  Sensation grossly intact.  Patient is speaking in a clear  voice.  Psychiatric:        Behavior: Behavior normal.      ED Treatments / Results  Labs (all labs ordered are listed, but only abnormal results are displayed) Labs Reviewed  CBC WITH  DIFFERENTIAL/PLATELET - Abnormal; Notable for the following components:      Result Value   RBC 3.20 (*)    Hemoglobin 7.7 (*)    HCT 26.4 (*)    MCH 24.1 (*)    MCHC 29.2 (*)    RDW 17.7 (*)    Platelets 589 (*)    All other components within normal limits  COMPREHENSIVE METABOLIC PANEL - Abnormal; Notable for the following components:   Sodium 133 (*)    Potassium 3.3 (*)    Glucose, Bld 121 (*)    BUN 6 (*)    Calcium 8.0 (*)    Total Protein 5.5 (*)    Albumin 1.7 (*)    All other components within normal limits  NOVEL CORONAVIRUS, NAA (HOSPITAL ORDER, SEND-OUT TO REF LAB)    EKG EKG Interpretation  Date/Time:  Wednesday April 25 2019 14:59:38 EDT Ventricular Rate:  67 PR Interval:    QRS Duration: 82 QT Interval:  401 QTC Calculation: 424 R Axis:   25 Text Interpretation:  Sinus rhythm Atrial premature complex Anteroseptal infarct, old Confirmed by Loren RacerYelverton, Reon Hunley ([id number]) on 04/25/2019 5:52:59 PM   Radiology Dg Chest 2 View  Result Date: 04/24/2019 CLINICAL DATA:  Chest pain EXAM: CHEST - 2 VIEW COMPARISON:  03/13/2019 FINDINGS: Upper normal heart size. Mediastinal contours and pulmonary vascularity normal. Bronchitic changes and chronic accentuation of perihilar markings, stable. Mild LEFT basilar atelectasis and question minimal LEFT pleural effusion No acute infiltrate, RIGHT pleural effusion or pneumothorax. Bones demineralized. IMPRESSION: Mild RIGHT basilar atelectasis and question minimal LEFT pleural effusion. Chronic bronchitic changes. Electronically Signed   By: Ulyses SouthwardMark  Boles M.D.   On: 04/24/2019 11:39   Ct Head Wo Contrast  Result Date: 04/25/2019 CLINICAL DATA:  Altered level of consciousness. Small left parietal white matter infarct seen on recent MRI. EXAM: CT HEAD WITHOUT CONTRAST TECHNIQUE: Contiguous axial images were obtained from the base of the skull through the vertex without intravenous contrast. COMPARISON:  04/24/2019 CT and MRI FINDINGS: Brain:  Negative for acute hemorrhage, mass lesion, midline shift or hydrocephalus or large new infarct. Again noted is extensive low density in the white matter. Again noted is a focal area of low density in the right parietal white matter tract compatible with old infarct or ischemia. Vascular: No hyperdense vessel or unexpected calcification. Skull: Normal. Negative for fracture or focal lesion. Sinuses/Orbits: No significant sinus disease. Other: None IMPRESSION: 1. No acute intracranial abnormality. 2. Chronic white matter disease is similar to the recent comparison examination. Findings are suggestive for chronic small vessel ischemic changes. Electronically Signed   By: Richarda OverlieAdam  Henn M.D.   On: 04/25/2019 17:13   Ct Head Wo Contrast  Result Date: 04/24/2019 CLINICAL DATA:  Ct head wo, complaint of sudden onset chest discomfort slurred speech and slowed mentation. This lasted for a short period of time and then resolved. Patient does not remember anything that happened. She was sent to rule out a.*comment was truncated*Altered level of consciousness (LOC), unexplained EXAM: CT HEAD WITHOUT CONTRAST TECHNIQUE: Contiguous axial images were obtained from the base of the skull through the vertex without intravenous contrast. COMPARISON:  CT 03/29/2017 FINDINGS: Brain: No acute intracranial hemorrhage. No focal mass lesion. No CT evidence of acute infarction.  No midline shift or mass effect. No hydrocephalus. Basilar cisterns are patent. There are periventricular and subcortical white matter hypodensities. Focal deep white matter infarction in the RIGHT centrum semiovale. Generalized cortical atrophy. Vascular: No hyperdense vessel or unexpected calcification. Skull: Normal. Negative for fracture or focal lesion. Sinuses/Orbits: No acute finding. Other: None. IMPRESSION: 1. No acute intracranial findings. 2. Atrophy and chronic white matter microvascular disease Electronically Signed   By: Genevive BiStewart  Edmunds M.D.   On:  04/24/2019 16:37   Mr Brain Wo Contrast  Result Date: 04/24/2019 CLINICAL DATA:  Slurred speech and altered mentation. EXAM: MRI HEAD WITHOUT CONTRAST TECHNIQUE: Multiplanar, multiecho pulse sequences of the brain and surrounding structures were obtained without intravenous contrast. COMPARISON:  Head CT 04/24/2019 and MRI/MRA 02/20/2019 FINDINGS: Brain: There is a punctate focus of trace diffusion hyperintensity in the white matter lateral to the atrium of the left lateral ventricle on both axial and coronal acquisitions. No definite reduced ADC is identified, however assessment is limited by its small size. There is a small amount of residual diffusion abnormality in the right corona radiata at the site of the acute infarct on the prior MRI. Background patchy and confluent T2 hyperintensities in the bilateral cerebral white matter and pons are similar to the prior MRI and nonspecific but compatible with severe chronic small vessel ischemic disease. Chronic lacunar infarcts are again seen in the bilateral basal ganglia, thalami, and corpus callosum. There is moderate cerebral atrophy. Vascular: Major intracranial vascular flow voids are preserved. Known 7 mm right MCA bifurcation aneurysm. Skull and upper cervical spine: Unremarkable bone marrow signal. Sinuses/Orbits: Unremarkable orbits. Clear paranasal sinuses. Trace bilateral mastoid fluid. Other: None. IMPRESSION: 1. 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 2. Severe chronic small vessel ischemic disease. Electronically Signed   By: Sebastian AcheAllen  Grady M.D.   On: 04/24/2019 17:50    Procedures Procedures (including critical care time)  Medications Ordered in ED Medications - No data to display   Initial Impression / Assessment and Plan / ED Course  I have reviewed the triage vital signs and the nursing notes.  Pertinent labs & imaging results that were available during my care of the patient were reviewed by me and considered  in my medical decision making (see chart for details).        Discussed with Dr. Pecolia AdesAroorr, neurology.  Recommends repeat CT head to rule out bleed and admit for observation and possible EEG. CT head without acute findings.  Discussed with hospitalist who will admit.   Final Clinical Impressions(s) / ED Diagnoses   Final diagnoses:  Seizure-like activity [facility](HCC)    ED Discharge Orders    None       Loren RacerYelverton, Emonte Dieujuste, MD 04/25/19 1753

## 2019-04-25 NOTE — Progress Notes (Signed)
Report received from "Deneise Lever RN (ED) at 2032. Pt on unit at 2049, AOx4, noted discomfort in LEs when turned. Pt wishes to have automatic meal trays. Pt noted to be able to swallow without difficulty. Diet ordered.

## 2019-04-25 NOTE — Plan of Care (Signed)
  Problem: Education: Goal: Knowledge of disease or condition will improve Outcome: Progressing Goal: Knowledge of secondary prevention will improve Outcome: Progressing Goal: Knowledge of patient specific risk factors addressed and post discharge goals established will improve Outcome: Progressing Goal: Individualized Educational Video(s) Outcome: Progressing   Problem: Coping: Goal: Will verbalize positive feelings about self Outcome: Progressing Goal: Will identify appropriate support needs Outcome: Progressing   Problem: Education: Goal: Knowledge of General Education information will improve Description: Including pain rating scale, medication(s)/side effects and non-pharmacologic comfort measures Outcome: Progressing   Problem: Health Behavior/Discharge Planning: Goal: Ability to manage health-related needs will improve Outcome: Progressing   Problem: Clinical Measurements: Goal: Ability to maintain clinical measurements within normal limits will improve Outcome: Progressing Goal: Will remain free from infection Outcome: Progressing Goal: Diagnostic test results will improve Outcome: Progressing Goal: Respiratory complications will improve Outcome: Progressing Goal: Cardiovascular complication will be avoided Outcome: Progressing   Problem: Activity: Goal: Risk for activity intolerance will decrease Outcome: Progressing   Problem: Nutrition: Goal: Adequate nutrition will be maintained Outcome: Progressing   Problem: Coping: Goal: Level of anxiety will decrease Outcome: Progressing   Problem: Elimination: Goal: Will not experience complications related to bowel motility Outcome: Progressing Goal: Will not experience complications related to urinary retention Outcome: Progressing   Problem: Pain Managment: Goal: General experience of comfort will improve Outcome: Progressing   Problem: Safety: Goal: Ability to remain free from injury will  improve Outcome: Progressing   Problem: Skin Integrity: Goal: Risk for impaired skin integrity will decrease Outcome: Progressing   Ival Bible, BSN, RN

## 2019-04-26 ENCOUNTER — Inpatient Hospital Stay (HOSPITAL_COMMUNITY): Payer: Medicare Other

## 2019-04-26 DIAGNOSIS — F0150 Vascular dementia without behavioral disturbance: Secondary | ICD-10-CM

## 2019-04-26 DIAGNOSIS — I639 Cerebral infarction, unspecified: Secondary | ICD-10-CM

## 2019-04-26 DIAGNOSIS — E785 Hyperlipidemia, unspecified: Secondary | ICD-10-CM

## 2019-04-26 LAB — HEMOGLOBIN A1C
Hgb A1c MFr Bld: 5.3 % (ref 4.8–5.6)
Mean Plasma Glucose: 105.41 mg/dL

## 2019-04-26 LAB — URINALYSIS, ROUTINE W REFLEX MICROSCOPIC
Bilirubin Urine: NEGATIVE
Glucose, UA: NEGATIVE mg/dL
Hgb urine dipstick: NEGATIVE
Ketones, ur: NEGATIVE mg/dL
Leukocytes,Ua: NEGATIVE
Nitrite: NEGATIVE
Protein, ur: NEGATIVE mg/dL
Specific Gravity, Urine: 1.015 (ref 1.005–1.030)
pH: 7 (ref 5.0–8.0)

## 2019-04-26 LAB — ECHOCARDIOGRAM LIMITED
Height: 65 in
Weight: 2272 oz

## 2019-04-26 LAB — LIPID PANEL
Cholesterol: 81 mg/dL (ref 0–200)
HDL: 40 mg/dL — ABNORMAL LOW (ref >40)
LDL Cholesterol: 26 mg/dL (ref 0–99)
Total CHOL/HDL Ratio: 2 RATIO
Triglycerides: 74 mg/dL (ref <150)
VLDL: 15 mg/dL (ref 0–40)

## 2019-04-26 LAB — MRSA PCR SCREENING: MRSA by PCR: NEGATIVE

## 2019-04-26 LAB — NOVEL CORONAVIRUS, NAA (HOSP ORDER, SEND-OUT TO REF LAB; TAT 18-24 HRS): SARS-CoV-2, NAA: NOT DETECTED

## 2019-04-26 IMAGING — MR MRI HEAD WITHOUT CONTRAST
6 series · 48 of 48 positions shown · non-contrast
Comparison: Brain MRI [DATE] and earlier.

CLINICAL DATA: 78-year-old female status post right corona radiata
infarct in [place] with residual left hemiparesis. Right MCA aneurysm.
Possible seizure.

EXAM:
MRI HEAD WITHOUT CONTRAST
TECHNIQUE: Multiplanar, multiecho pulse sequences of the brain and surrounding
structures were obtained without intravenous contrast.

[Series 5: DWI · axial · 3.0mm · 0.88mm/px · z∈[-59,+97]mm · 17 of 106 slices shown (1 of 2)]
[im 1/106]
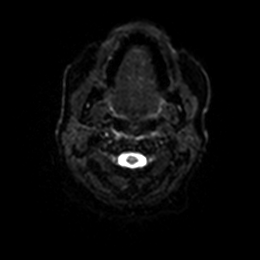
[im 7/106]
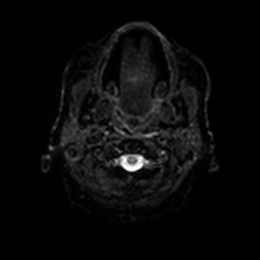
[im 14/106]
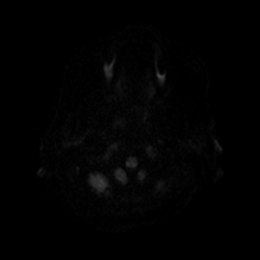
[im 20/106]
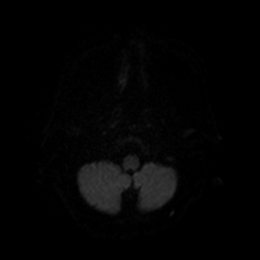
[im 27/106]
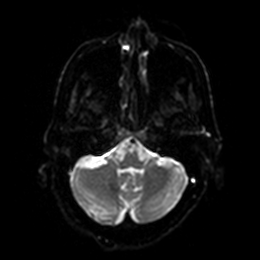
[im 33/106]
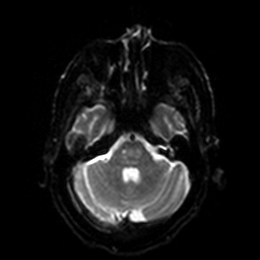
[im 40/106]
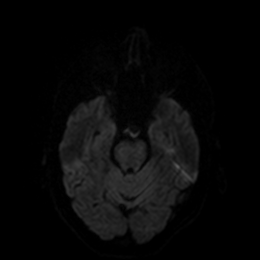
[im 46/106]
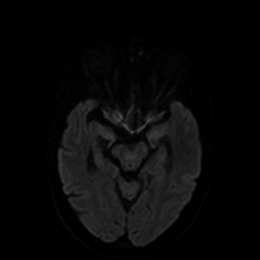
[im 53/106]
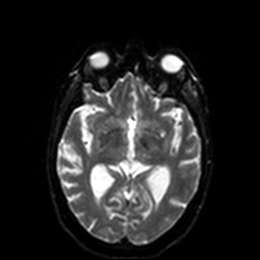
[im 60/106]
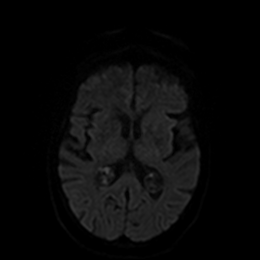
[im 66/106]
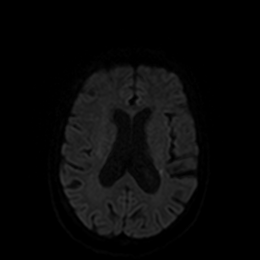
[im 73/106]
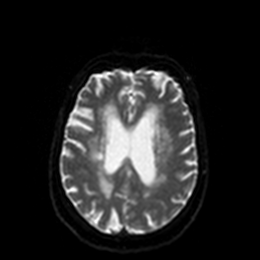
[im 79/106]
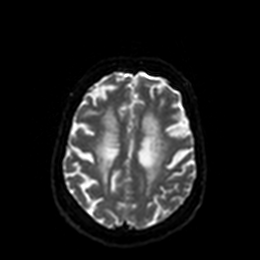
[im 86/106]
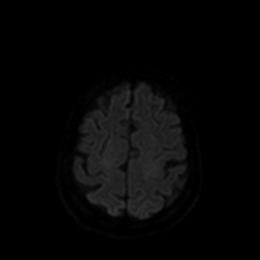
[im 92/106]
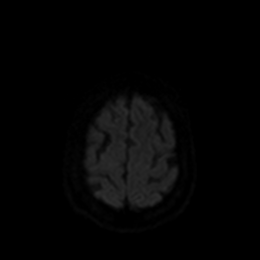
[im 99/106]
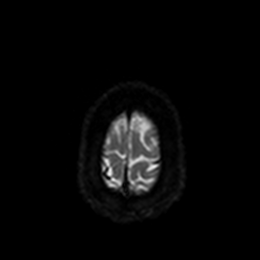
[im 106/106]
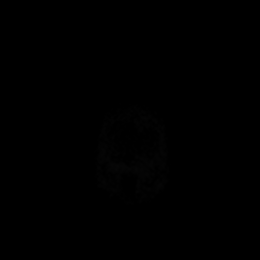

[Series 6: DWI · axial · 3.0mm · 0.88mm/px · z∈[-59,+97]mm · 8 of 53 slices shown (2 of 2)]
[im 1/53]
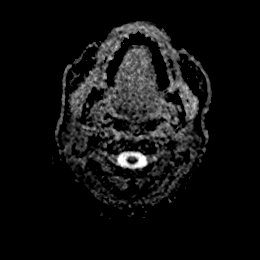
[im 8/53]
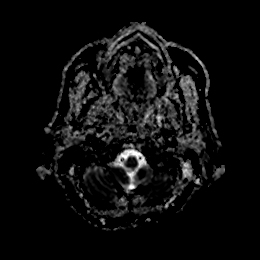
[im 15/53]
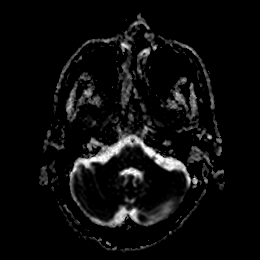
[im 23/53]
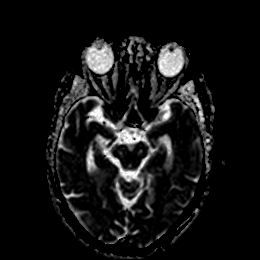
[im 30/53]
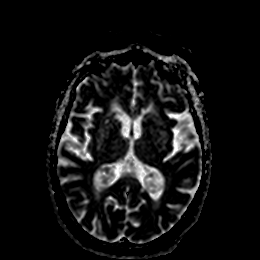
[im 38/53]
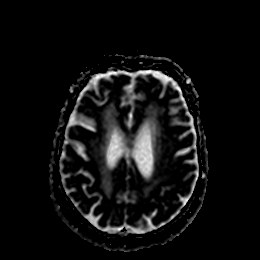
[im 45/53]
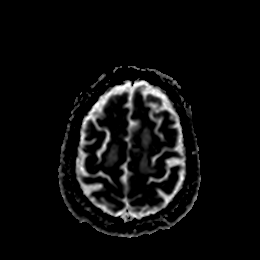
[im 53/53]
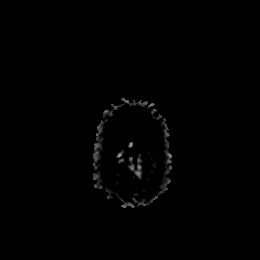

[Series 9: pha_images · axial · 3.0mm · 0.90mm/px · z∈[-52,+91]mm · 7 of 47 slices shown]
[im 1/47]
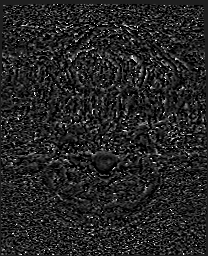
[im 8/47]
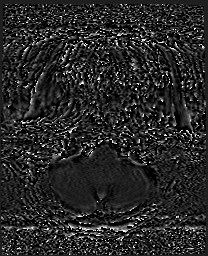
[im 16/47]
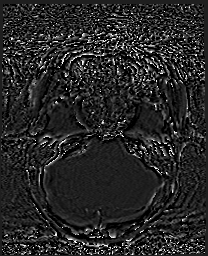
[im 24/47]
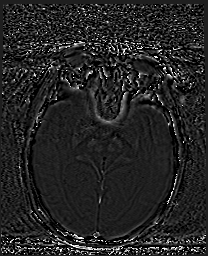
[im 31/47]
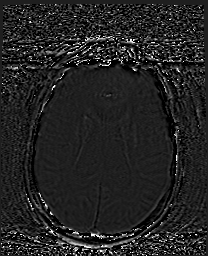
[im 39/47]
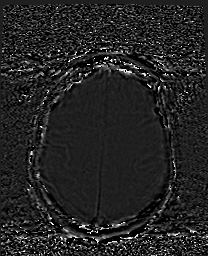
[im 47/47]
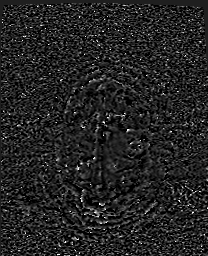

[Series 10: swi_images · axial · 3.0mm · 0.90mm/px · z∈[-55,+97]mm · 8 of 52 slices shown]
[im 1/52]
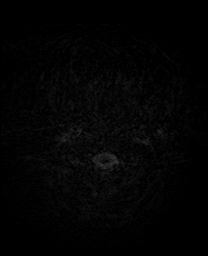
[im 8/52]
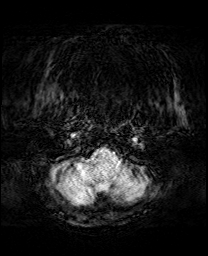
[im 15/52]
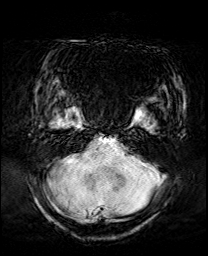
[im 22/52]
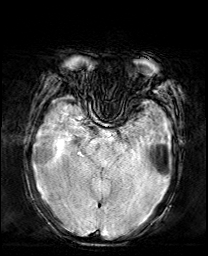
[im 30/52]
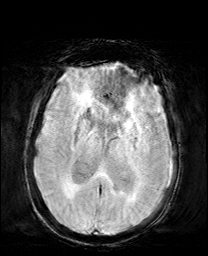
[im 37/52]
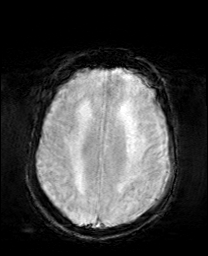
[im 44/52]
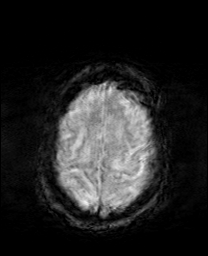
[im 52/52]
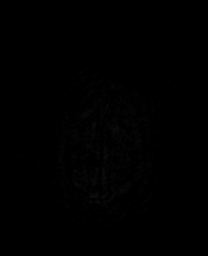

[Series 12: T2 · axial · 5.0mm · 0.72mm/px · z∈[-59,+97]mm · 4 of 27 slices shown]
[im 1/27]
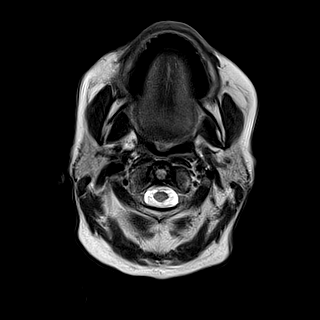
[im 9/27]
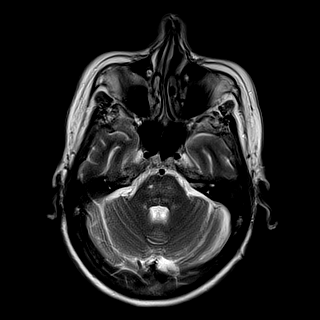
[im 18/27]
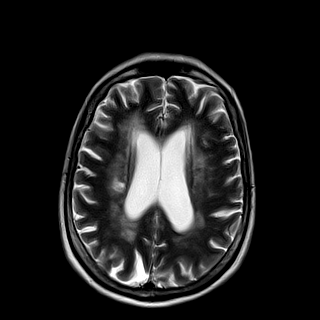
[im 27/27]
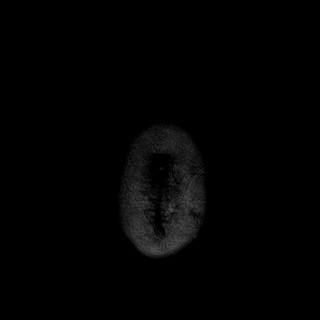

[Series 13: FLAIR · axial · 5.0mm · 0.90mm/px · z∈[-59,+97]mm · 4 of 27 slices shown]
[im 1/27]
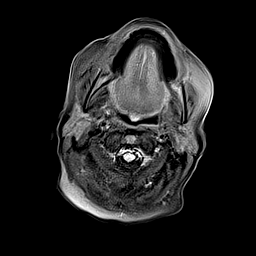
[im 9/27]
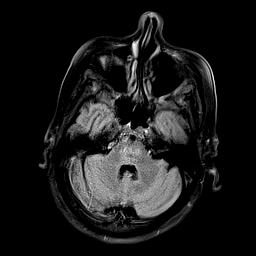
[im 18/27]
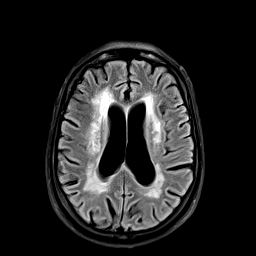
[im 27/27]
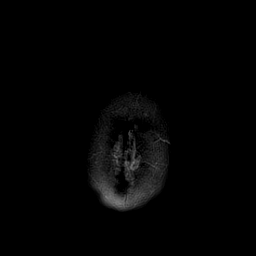

[48 of 48 positions shown; findings below may reference images not displayed]

FINDINGS: The examination had to be discontinued prior to completion due to
patient agitation. Axial diffusion, axial T2, FLAIR, and SWI only
was obtained.

Brain: Residual diffusion abnormality in the right corona radiata
and the recently demonstrated punctate area in the left periatrial
white matter have not significantly changed.

No new restricted diffusion. No midline shift, mass effect, evidence
of mass lesion, ventriculomegaly, extra-axial collection or acute
intracranial hemorrhage. Cervicomedullary junction and pituitary are
within normal limits.

Incidental choroid plexus cysts.

Stable confluent bilateral cerebral white matter T2 and FLAIR
hyperintensity, extensive bilateral deep gray matter nuclei T2
heterogeneity, and similar T2 heterogeneity in the pons. No new
signal abnormality identified. Chronic microhemorrhage in the
lateral right occipital lobe is stable. No other chronic blood
products identified.

Vascular: Major intracranial vascular flow voids are stable. Right
MCA aneurysm redemonstrated on series 12, image 13.

Sinuses/Orbits: Stable, negative.

Other: Mastoids remain clear. Visible internal auditory structures
appear normal.
IMPRESSION: Discontinued exam prior to completion due to patient agitation, but
stable noncontrast MRI appearance of the brain from the recent
[DATE] exam.

## 2019-04-26 MED ORDER — ENSURE ENLIVE PO LIQD
237.0000 mL | Freq: Two times a day (BID) | ORAL | Status: DC
  Administered 2019-04-26 – 2019-04-28 (×4): 237 mL via ORAL

## 2019-04-26 MED ORDER — MORPHINE SULFATE (PF) 2 MG/ML IV SOLN
2.0000 mg | Freq: Once | INTRAVENOUS | Status: AC | PRN
  Administered 2019-04-26: 2 mg via INTRAVENOUS
  Filled 2019-04-26: qty 1

## 2019-04-26 MED ORDER — OXYCODONE-ACETAMINOPHEN 5-325 MG PO TABS
1.0000 | ORAL_TABLET | Freq: Every day | ORAL | Status: DC | PRN
  Administered 2019-04-26: 1 via ORAL
  Filled 2019-04-26: qty 1

## 2019-04-26 NOTE — Progress Notes (Addendum)
NEUROLOGY PROGRESS NOTE  Subjective: Main complaint at this time is that she has back pain.  States that she was unable to do the MRI secondary to severe back pain.  She states that she is willing to try again if she is able to get something to make her more relaxed.  Exam: Vitals:   04/26/19 0652 04/26/19 0740  BP: 124/71 (!) 142/69  Pulse: 65 64  Resp: 18 18  Temp: 97.9 F (36.6 C) 97.7 F (36.5 C)  SpO2: 94% 96%    Physical Exam   HEENT-  Normocephalic, no lesions, without obvious abnormality.  Normal external eye and conjunctiva.   Extremities- Warm, dry and intact Musculoskeletal-no joint tenderness, deformity or swelling Skin-warm and dry, no hyperpigmentation, vitiligo, or suspicious lesions    Neuro:  Mental Status: Alert, oriented, thought content appropriate.  Speech fluent without evidence of aphasia.  Able to follow simple commands without difficulty. Cranial Nerves: II:  Visual fields grossly normal,  III,IV, VI: ptosis not present, extra-ocular motions intact bilaterally pupils equal, round, reactive to light and accommodation V,VII: smile symmetric, facial light touch sensation normal bilaterally VIII: hearing normal bilaterally IX,X: Palate rises midline XI: bilateral shoulder shrug XII: midline tongue extension Motor: Right : Upper extremity   5/5    Left:     Upper extremity   4/5  Lower extremity   5/5     Lower extremity   3/5 Tone and bulk:normal tone throughout; no atrophy noted Sensory: Pinprick and light touch intact throughout, bilaterally Deep Tendon Reflexes: 2+ and symmetric throughout Plantars: Right: downgoing   Left: downgoing     Medications:  Scheduled: . acetaminophen  650 mg Oral TID  . atorvastatin  40 mg Oral q1800  . clopidogrel  75 mg Oral Daily  . diclofenac sodium  2 g Topical QID  . enoxaparin (LOVENOX) injection  40 mg Subcutaneous Q24H  . gabapentin  100 mg Oral TID  . levETIRAcetam  500 mg Oral BID  . magnesium  oxide  400 mg Oral BID  . Melatonin  6 mg Oral QHS  . pantoprazole  40 mg Oral BID AC  . sertraline  25 mg Oral Daily  . sucralfate  1 g Oral TID AC & HS   Continuous:   Pertinent Labs/Diagnostics: No new pertinent labs at this time    Ct Head Wo Contrast  Result Date: 04/25/2019 CLINICAL DATA:  Altered level of consciousness. Small left parietal white matter infarct seen on recent MRI. EXAM: CT HEAD WITHOUT CONTRAST TECHNIQUE: Contiguous axial images were obtained from the base of the skull through the vertex without intravenous contrast. COMPARISON:  04/24/2019 CT and MRI FINDINGS: Brain: Negative for acute hemorrhage, mass lesion, midline shift or hydrocephalus or large new infarct. Again noted is extensive low density in the white matter. Again noted is a focal area of low density in the right parietal white matter tract compatible with old infarct or ischemia. Vascular: No hyperdense vessel or unexpected calcification. Skull: Normal. Negative for fracture or focal lesion. Sinuses/Orbits: No significant sinus disease. Other: None IMPRESSION: 1. No acute intracranial abnormality. 2. Chronic white matter disease is similar to the recent comparison examination. Findings are suggestive for chronic small vessel ischemic changes. Electronically Signed   By: Markus Daft M.D.   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  Presenting to the hospital which is most likely to have been a seizure.  Patient has had no further events since yesterday.  Recommendations: - Continue Plavix 75 mg daily - EEG -Repeat MRI attempt if possible -Continue Keppra 500 mg twice daily - Telemetry and  consider discharging patient on Holter monitor -Echocardiogram    04/26/2019, 10:55 AM   NEUROHOSPITALIST ADDENDUM Performed a face to face diagnostic evaluation.   I have reviewed the contents of history and physical exam as documented by PA/ARNP/Resident and agree with above documentation.  I have discussed and formulated the above plan as documented. Edits to the note have been made as needed.  Pt brought to ER with what likely was a seizure (episode of up rolling of eyes, loss of consciousness followed by weakness in right arm lasting for few minutes). Called skilled nursing facility today, nurse who witnessed event not available.  His blood pressure was significantly elevated the time, other possibility consider was TIA/stroke. Patient did not tolerate Mri brain.  Echo was repeated, unchanged from prior exam.  Recommend discharge with Holter monitor.  Vascular imaging was not repeated as patient had study done in April and unlikely to have changed. EEG pending.  Patient started on and tolerating Keppra, slightly drowsy.    Will need to follow up with neurology as outpatient scheduled on 6/30  Brynley Cuddeback MD [facility] [phone]   If 7pm to 7am, please call on call as listed on AMION.

## 2019-04-26 NOTE — Evaluation (Signed)
Speech Language Pathology Evaluation Patient Details Name: Connie MarusRebecca Oconnor MRN: [id number] DOB: [birth year] Today's Date: 04/26/2019 Time: 1100-1120 SLP Time Calculation (min) (ACUTE ONLY): 20 min  Problem List:  Patient Active Problem List   Diagnosis Date Noted  . Stroke (HCC) 04/25/2019  . GI bleed 04/04/2019  . HLD (hyperlipidemia) 04/04/2019  . Vascular dementia without behavioral disturbance (HCC)   . 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 Gastric erosion   . Gastrointestinal hemorrhage   . Acute respiratory failure with hypoxia (HCC)   . Hyponatremia   . Tobacco abuse   . Pulmonary emphysema (HCC)   . Pulmonary nodules   . Primary osteoarthritis of right hip   . Subacute confusional state   . Lethargy   . Confusion, postoperative   . Slow transit constipation   . Pain of left calf   . Impulsiveness   . Dysphagia, post-stroke   . Labile blood pressure   . Hyperglycemia   . Hypotension   . Hypoalbuminemia due to protein-calorie malnutrition (HCC)   . Acute blood loss anemia   . Hypoglycemia   . Essential hypertension   . History of traumatic brain injury   . Lacunar infarct, acute (HCC) 02/23/2019  . 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 Hypothyroidism (acquired) 02/20/2019  . Closed fracture of medial portion of right tibial plateau 01/11/2018  . Acute medial meniscal tear, right, initial encounter 01/11/2018  . SAH (subarachnoid hemorrhage) (HCC) 03/29/2017   Past Medical History:  Past Medical History:  Diagnosis Date  . Anemia    hx of  . Arthritis   . Dislocation of right knee with medial meniscus tear   . Headache    hx of migraines none since 1990's  . Hypothyroidism    1978, no problems since  . Pneumonia 1990's none since  . PVC (premature ventricular contraction)    199'0's none since  . SAH (subarachnoid hemorrhage) (HCC) 2016   On the right  . Stroke  [facility](HCC)    Past Surgical History:  Past Surgical History:  Procedure Laterality Date  . BACK SURGERY    . CERVICAL LAMINECTOMY  1986  . DILATION AND CURETTAGE OF UTERUS  1970  . ELBOW SURGERY Right 1993  . ESOPHAGOGASTRODUODENOSCOPY N/A 03/20/2019   Procedure: ESOPHAGOGASTRODUODENOSCOPY (EGD);  Surgeon: Tressia DanasBeavers, Kimberly, MD;  [facility];  Service: Gastroenterology;  Laterality: N/A;  . IR ANGIO INTRA EXTRACRAN SEL COM CAROTID INNOMINATE BILAT MOD SED  02/22/2019  . IR ANGIO VERTEBRAL SEL SUBCLAVIAN INNOMINATE UNI L MOD SED  02/22/2019  . IR ANGIO VERTEBRAL SEL VERTEBRAL UNI R MOD SED  02/22/2019  . KNEE ARTHROSCOPY WITH SUBCHONDROPLASTY Right 01/11/2018   Procedure: KNEE ARTHROSCOPY WITH SUBCHONDROPLASTY, PARTIAL MEDIAL MENISCECTOMY, CHONDROPLASTY OF THE MEDIAL PATELLA FEMORAL CHONDYL, MEDIAL PLICA  EXCISION;  Surgeon: Jodi GeraldsGraves, Tanika Bracco, MD;  Location: [facility] ORS;  Service: Orthopedics;  Laterality: Right;  . LUMBAR LAMINECTOMY  1993   rods placed bone generated removed one year later   . MULTIPLE TOOTH EXTRACTIONS    . SHOULDER SURGERY Left    arthritis clean out and shortened collar bone dr Renae Ficklepaul  . TONSILLECTOMY  1970  . VAGINAL HYSTERECTOMY  1971   partial dr Dewaine Congerbarker  . WISDOM TOOTH EXTRACTION     HPI:  Patient is a 78 y.o. female with PMh: SAH in 2018, HTN, acute CVA April 2020 with left sided weakness, gastric ulcer, who presented to ED on  6/23 from SNF with slurred speech and chest pain and was markedly hyptertensive. MRI showed punctate acute to subacute infarct in periventricular white matter, but slurred speech resolved and she was discharged home. on 6/24, patient presented again to ED with apparent episode of transient alteration of consciousness , with right arm weakness and elevated BP. CT was negative. Neurology evaluated and noted that patient has a 7 mm right MCA aneurysm and they believe she most likely had a seizure.   Assessment / Plan / Recommendation Clinical  Impression  Patient presents with a mild-mod cognitive-linguisitc impairment however it is difficult to determine how far off baseline she is, as she had completed acute and inpatient rehab and then SNF rehab after CVA in April of 2020. Patient also appeared fatigued. She had a flat, odd affect and would often stare at SLP and needed frequent cues to respond to questions. She was oriented to self, place and situation, but for time she only knew the year "2020". She was able to describe some of her symptoms but at this point she has limited intellectual awareness. She demonstrated poor recall of new information, decreased overall awareness, decreased cognitive processing speed and attention. She would benefit from short term acute SLP intervention and then SNF level SLP interventio    SLP Assessment  SLP Recommendation/Assessment: Patient needs continued Speech Lanaguage Pathology Services SLP Visit Diagnosis: Cognitive communication deficit (R41.841)    Follow Up Recommendations  Skilled Nursing facility    Frequency and Duration min 1 x/week  1 week      SLP Evaluation Cognition  Overall Cognitive Status: Difficult to assess Arousal/Alertness: Awake/alert Orientation Level: Oriented to person;Oriented to place;Oriented to situation;Disoriented to time Attention: Sustained Sustained Attention: Impaired Sustained Attention Impairment: Verbal basic Memory: Impaired Memory Impairment: Decreased recall of new information Awareness: Impaired Awareness Impairment: Emergent impairment       Comprehension  Auditory Comprehension Overall Auditory Comprehension: Impaired Yes/No Questions: Within Functional Limits Commands: Within Functional Limits Conversation: Simple Interfering Components: Attention;Processing speed EffectiveTechniques: Extra processing time;Increased volume;Repetition    Expression Expression Primary Mode of Expression: Verbal Verbal Expression Overall Verbal  Expression: Appears within functional limits for tasks assessed Written Expression Dominant Hand: Left   Oral / Motor  Oral Motor/Sensory Function Overall Oral Motor/Sensory Function: Within functional limits   GO                    Dannial Monarch 04/26/2019, 5:41 PM   Sonia Baller, MA, CCC-SLP Speech Therapy [facility] Acute Rehab Pager: [phone]

## 2019-04-26 NOTE — Evaluation (Signed)
Physical Therapy Evaluation Patient Details Name: Connie Oconnor MRN: [id number] DOB: December 28, 1940 Today's Date: 04/26/2019   History of Present Illness  Patient is a 78 year old female admitted from a skiled nursing home on 04/25/2019 with complaints of slurred speech and chest pain. She at rehab folling a CVA. 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 PMH includes anemia, headaches, PNA, PVCs, [place] 2016, vascular dementia, hx of GI hemorrhage, emphysema, R knee scope with subchondroplasty 2019, lumbar laminectomy, L shoulder scope.   Clinical Impression  Patient required significant assist for bed mobility. At baseline she reported left LE pain but reported right LE pain with transfer. She required mod a to remain sitting and leaned hard to the left. She would benefit from further skilled therapy to improve ability to transfer. She would benefit from rehab at a SNF.     Follow Up Recommendations SNF;Supervision/Assistance - 24 hour    Equipment Recommendations  None recommended by PT    Recommendations for Other Services       Precautions / Restrictions Precautions Precautions: Fall Precaution Comments: L hemiparesis UE>LE, L shoulder hypotonia  Restrictions Weight Bearing Restrictions: No      Mobility  Bed Mobility Overal bed mobility: Needs Assistance Bed Mobility: Rolling;Sidelying to Sit;Sit to Supine Rolling: Mod assist Sidelying to sit: Max assist;+2 for physical assistance;+2 for safety/equipment   Sit to supine: +2 for physical assistance;+2 for safety/equipment;Max assist   General bed mobility comments: On frist trial of bed mobility patient loudly verbalized loudly pain in her right LE. On the second trial she required max a to transfer to the edge of the bed. she required mod a to remain seated. She had a heavey lean to the left. Patient declined to transfer.  Transfers                    Ambulation/Gait                 Stairs            Wheelchair Mobility    Modified Rankin (Stroke Patients Only) Modified Rankin (Stroke Patients Only) Pre-Morbid Rankin Score: Severe disability Modified Rankin: Severe disability     Balance Overall balance assessment: Needs assistance Sitting-balance support: Bilateral upper extremity supported;Feet supported Sitting balance-Leahy Scale: Poor Sitting balance - Comments: mod a, heavy lean to the left                                      Pertinent Vitals/Pain Pain Assessment: Faces Faces Pain Scale: Hurts whole lot Pain Location: in right leg with movement  Pain Descriptors / Indicators: Grimacing;Guarding(yelling ) Pain Intervention(s): Limited activity within patient's tolerance;Monitored during session    Home Living Family/patient expects to be discharged to:: Skilled nursing facility Living Arrangements: Alone Available Help at Discharge: Friend(s);Family;Available PRN/intermittently Type of Home: House Home Access: Ramped entrance     Home Layout: One level Home Equipment: Clinical cytogeneticist - 2 wheels;Cane - single point;Wheelchair - manual Additional Comments: from rpevious admission     Prior Function Level of Independence: Needs assistance   Gait / Transfers Assistance Needed: was transfering with a lift. Patient reports on the day she was admitted she became agitated about getting up  ADL's / Homemaking Assistance Needed: Pt reports assist with toileting.         Hand Dominance   Dominant Hand: Left  Extremity/Trunk Assessment   Upper Extremity Assessment Upper Extremity Assessment: Defer to OT evaluation    Lower Extremity Assessment Lower Extremity Assessment: Generalized weakness RLE: Unable to fully assess due to pain    Cervical / Trunk Assessment Cervical / Trunk Assessment: Normal  Communication   Communication: No difficulties  Cognition Arousal/Alertness: Awake/alert Behavior During  Therapy: WFL for tasks assessed/performed Overall Cognitive Status: Within Functional Limits for tasks assessed                                 General Comments: required mod encouragement to participate      General Comments      Exercises     Assessment/Plan    PT Assessment Patient needs continued PT services  PT Problem List Decreased strength;Decreased mobility;Impaired tone;Decreased range of motion;Decreased activity tolerance;Decreased balance;Pain;Decreased knowledge of use of DME       PT Treatment Interventions DME instruction;Functional mobility training;Balance training;Patient/family education;Gait training;Therapeutic activities;Neuromuscular re-education;Therapeutic exercise    PT Goals (Current goals can be found in the Care Plan section)  Acute Rehab PT Goals Patient Stated Goal: decrease R hip pain  PT Goal Formulation: With patient Time For Goal Achievement: 04/12/19 Potential to Achieve Goals: Good    Frequency Min 2X/week   Barriers to discharge        Co-evaluation PT/OT/SLP Co-Evaluation/Treatment: Yes Reason for Co-Treatment: Complexity of the patient's impairments (multi-system involvement);Necessary to address cognition/behavior during functional activity;For patient/therapist safety;To address functional/ADL transfers           AM-PAC PT "6 Clicks" Mobility  Outcome Measure Help needed turning from your back to your side while in a flat bed without using bedrails?: Total Help needed moving from lying on your back to sitting on the side of a flat bed without using bedrails?: Total Help needed moving to and from a bed to a chair (including a wheelchair)?: Total Help needed standing up from a chair using your arms (e.g., wheelchair or bedside chair)?: Total Help needed to walk in hospital room?: Total Help needed climbing 3-5 steps with a railing? : Total 6 Click Score: 6    End of Session   Activity Tolerance: Patient  tolerated treatment well Patient left: in bed;with bed alarm set;with call bell/phone within reach Nurse Communication: Mobility status PT Visit Diagnosis: Hemiplegia and hemiparesis;Other abnormalities of gait and mobility (R26.89) Hemiplegia - Right/Left: Left Hemiplegia - dominant/non-dominant: Dominant Hemiplegia - caused by: Cerebral infarction    Time: 1250-1312 PT Time Calculation (min) (ACUTE ONLY): 22 min   Charges:   PT Evaluation $PT Eval Moderate Complexity: 1 Mod           Dessie Comaavid J Darra Rosa PT DPT  04/26/2019, 2:48 PM

## 2019-04-26 NOTE — Progress Notes (Signed)
EEG Completed; Results Pending  

## 2019-04-26 NOTE — Evaluation (Signed)
Occupational Therapy Evaluation Patient Details Name: Connie MarusRebecca Oconnor MRN: [id number] DOB: 03-22-1941 Today's Date: 04/26/2019    History of Present Illness Patient is a 78 year old female admitted from a skiled nursing home on 04/25/2019 with complaints of slurred speech and chest pain. She at rehab folling a CVA. 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 PMH includes anemia, headaches, PNA, PVCs, SAH 2016, vascular dementia, hx of GI hemorrhage, emphysema, R knee scope with subchondroplasty 2019, lumbar laminectomy, L shoulder scope.    Clinical Impression   Pt PTA: Pt performing ADL with assist from SNF staff and mobility with hoyer lift. Pt reports refusing to get OOB due to pain at SNF. Pt currently just as limited. Pt self-limiting at this time- unwilling to attempt sit to stand. Pt reports pain with any movement, especially in RLE. Pt's LUE poor coordination and poor strength (dominant hand) from previous CVA. RUE appears, WFL could improve in coordination, especially for aiming with utensils and food. Pt hellicopter to EOB with maxA +2. Pt holding onto rail throughout, but not assisting to sit upright. Pt with L lateral lean when sitting EOB with poor sitting balance- not tolerating EOB >3 mins. Pt would greatly benefit from continued OT skilled services for ADL, mobility and safety in SNF setting. OT following acutely.      Follow Up Recommendations  SNF;Supervision/Assistance - 24 hour    Equipment Recommendations  None recommended by OT    Recommendations for Other Services       Precautions / Restrictions Precautions Precautions: Fall Precaution Comments: L hemiparesis UE>LE, L shoulder hypotonia  Restrictions Weight Bearing Restrictions: No      Mobility Bed Mobility Overal bed mobility: Needs Assistance Bed Mobility: Rolling;Sidelying to Sit;Sit to Supine Rolling: Mod assist Sidelying to sit: Max assist;+2 for physical assistance;+2 for  safety/equipment   Sit to supine: Max assist;+2 for safety/equipment;+2 for physical assistance   General bed mobility comments: On frist trial of bed mobility patient loudly verbalized loudly pain in her right LE. On the second trial she required max a to transfer to the edge of the bed. she required mod a to remain seated. She had a heavey lean to the left. Patient declined to transfer.  Transfers                 General transfer comment: DNT- pt refusing OOB    Balance Overall balance assessment: Needs assistance Sitting-balance support: Bilateral upper extremity supported;Feet supported Sitting balance-Leahy Scale: Poor Sitting balance - Comments: modA, lateral lean to L                                   ADL either performed or assessed with clinical judgement   ADL Overall ADL's : Needs assistance/impaired Eating/Feeding: Set up;Sitting   Grooming: Minimal assistance;Bed level   Upper Body Bathing: Minimal assistance;Bed level   Lower Body Bathing: Maximal assistance;Bed level   Upper Body Dressing : Minimal assistance;Bed level   Lower Body Dressing: Maximal assistance;Bed level   Toilet Transfer: Total assistance   Toileting- Clothing Manipulation and Hygiene: Maximal assistance;Total assistance;Bed level       Functional mobility during ADLs: Total assistance General ADL Comments: minA for UB ADL and maxA to totalA for LB ADL. pt very unmotivated to attempt anything other than self feeding today     Vision Baseline Vision/History: No visual deficits Patient Visual Report: No  change from baseline Vision Assessment?: No apparent visual deficits Additional Comments: Visual scanning, WFL     Perception     Praxis      Pertinent Vitals/Pain Pain Assessment: Faces Faces Pain Scale: Hurts whole lot Pain Location: in right leg with movement  Pain Descriptors / Indicators: Grimacing;Guarding(yelling ) Pain Intervention(s): Limited activity  within patient's tolerance     Hand Dominance Left   Extremity/Trunk Assessment Upper Extremity Assessment Upper Extremity Assessment: Generalized weakness;RUE deficits/detail;LUE deficits/detail RUE Coordination: decreased gross motor LUE Deficits / Details: 2-/5 MM grade overall LUE Coordination: decreased fine motor;decreased gross motor   Lower Extremity Assessment Lower Extremity Assessment: Defer to PT evaluation RLE: Unable to fully assess due to pain   Cervical / Trunk Assessment Cervical / Trunk Assessment: Normal   Communication Communication Communication: No difficulties   Cognition Arousal/Alertness: Awake/alert Behavior During Therapy: WFL for tasks assessed/performed Overall Cognitive Status: Within Functional Limits for tasks assessed                                 General Comments: required mod encouragement to participate   General Comments  Pt performing ADL at bed level with near dependence. Pt appears very unmotivated to move OOB and c/o pain in RLE with any movement    Exercises     Shoulder Instructions      Home Living Family/patient expects to be discharged to:: Skilled nursing facility Living Arrangements: Alone Available Help at Discharge: Friend(s);Family;Available PRN/intermittently Type of Home: House Home Access: Ramped entrance     Home Layout: One level     Bathroom Shower/Tub: Chief Strategy OfficerTub/shower unit   Bathroom Toilet: Handicapped height     Home Equipment: Emergency planning/management officerhower seat;Walker - 2 wheels;Cane - single point;Wheelchair - manual   Additional Comments: from previous admission       Prior Functioning/Environment Level of Independence: Needs assistance  Gait / Transfers Assistance Needed: was transfering with a lift. Patient reports on the day she was admitted she became agitated about getting up ADL's / Homemaking Assistance Needed: Pt reports assist with toileting.             OT Problem List: Decreased  strength;Decreased activity tolerance;Impaired balance (sitting and/or standing);Decreased coordination;Decreased cognition;Decreased safety awareness;Impaired UE functional use;Pain      OT Treatment/Interventions: Self-care/ADL training;Therapeutic exercise;Energy conservation;Neuromuscular education;Therapeutic activities;Cognitive remediation/compensation;Visual/perceptual remediation/compensation;Patient/family education;Balance training    OT Goals(Current goals can be found in the care plan section) Acute Rehab OT Goals Patient Stated Goal: decrease pain OT Goal Formulation: With patient Time For Goal Achievement: 05/10/19 Potential to Achieve Goals: Fair ADL Goals Pt Will Perform Grooming: with set-up;sitting Pt Will Perform Upper Body Dressing: with set-up;sitting Pt Will Perform Lower Body Dressing: with min assist;sitting/lateral leans;sit to/from stand;with adaptive equipment Pt Will Transfer to Toilet: with min assist;squat pivot transfer;bedside commode Pt/caregiver will Perform Home Exercise Program: Left upper extremity;With minimal assist Additional ADL Goal #1: pt will perform ADL with set-upA sitting EOB with fair sitting balance x7 mins.  OT Frequency: Min 2X/week   Barriers to D/C:            Co-evaluation PT/OT/SLP Co-Evaluation/Treatment: Yes Reason for Co-Treatment: Complexity of the patient's impairments (multi-system involvement);To address functional/ADL transfers   OT goals addressed during session: ADL's and self-care      AM-PAC OT "6 Clicks" Daily Activity     Outcome Measure Help from another person eating meals?: None Help from another person taking care  of personal grooming?: A Little Help from another person toileting, which includes using toliet, bedpan, or urinal?: Total Help from another person bathing (including washing, rinsing, drying)?: Total Help from another person to put on and taking off regular upper body clothing?: A Lot Help from  another person to put on and taking off regular lower body clothing?: Total 6 Click Score: 12   End of Session Nurse Communication: Mobility status  Activity Tolerance: Patient limited by pain Patient left: in bed;with call bell/phone within reach;with bed alarm set  OT Visit Diagnosis: Unsteadiness on feet (R26.81);Muscle weakness (generalized) (M62.81);Pain;Hemiplegia and hemiparesis Hemiplegia - Right/Left: Left Hemiplegia - dominant/non-dominant: Dominant Hemiplegia - caused by: Cerebral infarction Pain - Right/Left: Right Pain - part of body: Leg                Time: 1250-1313 OT Time Calculation (min): 23 min Charges:  OT General Charges $OT Visit: 1 Visit OT Evaluation $OT Eval Moderate Complexity: 1 Mod  Darryl Nestle) Marsa Aris OTR/L Acute Rehabilitation Services Pager: [phone] Office: [place] 04/26/2019, 3:08 PM

## 2019-04-26 NOTE — Progress Notes (Signed)
Initial Nutrition Assessment   RD working remotely.  DOCUMENTATION CODES:   Not applicable  INTERVENTION:  Provide Ensure Enlive po BID, each supplement provides 350 kcal and 20 grams of protein.  Encourage adequate PO intake.   NUTRITION DIAGNOSIS:   Increased nutrient needs related to acute illness as evidenced by estimated needs.  GOAL:   Patient will meet greater than or equal to 90% of their needs  MONITOR:   PO intake, Skin, Supplement acceptance, Weight trends, Labs, I & O's  REASON FOR ASSESSMENT:   Malnutrition Screening Tool    ASSESSMENT:   78 y.o. female with past medical history significant for traumatic SAH in 2018, HTN and acute stroke in April 2020 which presented with left-sided weakness. CT which was negative for any acute findings.  RD unable to obtain pt nutrition history during attempted time of contact. Per Neurology, suspect pt had seizure. Pt currently on a heart diet with thin liquids. Meal completion 50%. Per weight records, pt with a 18% weight loss in 1 month which is found significant for time frame. RD to order nutritional supplements to aid in caloric and protein needs.   Unable to complete Nutrition-Focused physical exam at this time.    Labs and medications reviewed.   Diet Order:   Diet Order            Diet Heart Room service appropriate? Yes with Assist; Fluid consistency: Thin  Diet effective now              EDUCATION NEEDS:   Not appropriate for education at this time  Skin:  Skin Assessment: Reviewed RN Assessment  Last BM:  6/23  Height:   Ht Readings from Last 1 Encounters:  04/25/19 5\' 5"  (1.651 m)    Weight:   Wt Readings from Last 1 Encounters:  04/25/19 64.4 kg    Ideal Body Weight:  56.8 kg  BMI:  Body mass index is 23.63 kg/m.  Estimated Nutritional Needs:   Kcal:  1700-1900  Protein:  80-90 grams  Fluid:  >/= 1.7 L/day    Corrin Parker, MS, RD, LDN Pager # [phone] After hours/  weekend pager # [phone]

## 2019-04-26 NOTE — Progress Notes (Signed)
Upon I&O cath, urine noted to be cloudy, with sediment, and had a foul smell. Dr. Baltazar Najjar informed.

## 2019-04-26 NOTE — Progress Notes (Signed)
PROGRESS NOTE    Connie MarusRebecca Ostermiller  ZOX:[id number] DOB: [birth year] DOA: 04/25/2019 PCP: Heide ScalesNelson, Kristen M, PA-C   Brief Narrative: 78 y.o.f w hx of traumatic SAH in 2018, HTN and acute stroke in April 2020 presented 6/24 with apparent episode of transient alteration of consciousness where she became unresponsive followed by right arm weakness and blood pressure at that time was markedly elevated for EMS.  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  She went to SNF on aspirin Plavix.  She was readmitted from 6/3-6/8 for anemia hb 6.9  fobt+ ,seen by GI, declined repeat EGD,asa stopped and plavix was resumed with instruction for outpatient hemoglobin monitoring. She has been living in skilled nursing facility since then, prior to that was living independently.Of note she was again seen in ER on 6/23 felt to be hypertensive emergency/punctate acute/subacute infarct in the left periventricular white matter region and was discharged from ER.  In ER seen by neuro, placed on keppra and was admitted for further work-up.   Subjective: This morning alert awake oriented resting comfortably, eating her meals.  No acute events overnight.  Assessment & Plan:  Episode of transient loss of consciousness followed by right-sided weakness: Differential includes seizure versus recurrent stroke/embolic stroke.  Discussed neurology obtaining MRI brain this morning.  Will obtain limited echocardiogram.  Continue on Keppra as per neurology, EEG pending.  Continue PT OT evaluation.  Continue to monitor neuro checks and cont home Plavix/lipitor 40 mg.  Monitor in telemetry and possible Holter monitoring upon discharge.   Recent thrombotic Stroke in April- on plavix. Had detailed work-up with MRI, MRA, 2D echo, A1c and lipid profile in April.  Essential hypertension:Blood pressure currently  well controlled.  Losartan on hold.  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  Continue supportive care fall precaution  HLD : Continue Lipitor  History of GI bleed/anemia: Patient had EGD in April, on a subsequent visit in beginning of June declined repeat EGD for anemia and FOBT positive stool.  Continue PPI monitor H&H closely while on antiplatelets. HB is at 7.7 (baseline In May and June:8 to 9 g, but previously April to prior in 13-14gm) repeat in am- transfuse if <7,?gi eval if agreeable for egd.  Continue iron sulfate. . Recent Labs  Lab 04/24/19 1140 04/25/19 1600  HGB 8.4* 7.7*  HCT 28.4* 26.4*    DVT prophylaxis: lovenox Code Status: DNR Family Communication: Updated patient's daughter over the phone.  Discussed with neurologist.   Disposition Plan: remains inpatient pending clinical improvement.   Consultants:  Neurology  Procedures:  Antimicrobials: Anti-infectives (From admission, onward)   None       Objective: Vitals:   04/26/19 0317 04/26/19 0506 04/26/19 0652 04/26/19 0740  BP: (!) 119/57 (!) 125/57 124/71 (!) (P) 142/69  Pulse: 60 61 65 (P) 64  Resp: 16 16 18  (P) 18  Temp: 97.8 F (36.6 C) 97.8 F (36.6 C) 97.9 F (36.6 C) (P) 97.7 F (36.5 C)  TempSrc: Oral Oral Oral (P) Oral  SpO2: 98% 96% 94% (P) 96%  Weight:      Height:        Intake/Output Summary (Last 24 hours) at 04/26/2019 0955 Last data filed at 04/26/2019 0639 Gross per 24 hour  Intake --  Output 640 ml  Net -640 ml   Filed Weights   04/25/19 1501  Weight: 64.4 kg   Weight change:   Body mass index is 23.63 kg/m.  Intake/Output from previous day: 06/24 0701 - 06/25 0700 In: -  Out: 640 [Urine:640] Intake/Output this shift: No intake/output data recorded.  Examination:  General exam: Appears calm and comfortable,NAD, older fore the age HEENT:PERRL,Oral mucosa moist, Ear/Nose normal on gross  exam Respiratory system: Bilateral equal air entry, normal vesicular breath sounds, no wheezes or crackles  Cardiovascular system: S1 & S2 heard,No JVD, murmurs. Gastrointestinal system: Abdomen is  soft, non tender, non distended, BS +  Nervous System:Alert and oriented.  Able to move her lower extremities, sensation intact.  Extremities: No edema, no clubbing, distal peripheral pulses palpable. Skin: No rashes, lesions, no icterus MSK: Normal muscle bulk,tone ,power  Medications:  Scheduled Meds:  acetaminophen  650 mg Oral TID   atorvastatin  40 mg Oral q1800   clopidogrel  75 mg Oral Daily   diclofenac sodium  2 g Topical QID   enoxaparin (LOVENOX) injection  40 mg Subcutaneous Q24H   gabapentin  100 mg Oral TID   levETIRAcetam  500 mg Oral BID   magnesium oxide  400 mg Oral BID   Melatonin  6 mg Oral QHS   pantoprazole  40 mg Oral BID AC   sertraline  25 mg Oral Daily   sucralfate  1 g Oral TID AC & HS   Continuous Infusions:  Data Reviewed: I have personally reviewed following labs and imaging studies  CBC: Recent Labs  Lab 04/24/19 1140 04/25/19 1600  WBC 5.7 6.3  NEUTROABS 4.5 4.7  HGB 8.4* 7.7*  HCT 28.4* 26.4*  MCV 82.6 82.5  PLT 607* 589*   Basic Metabolic Panel: Recent Labs  Lab 04/24/19 1140 04/25/19 1600  NA 135 133*  K 3.3* 3.3*  CL 100 101  CO2 26 25  GLUCOSE 103* 121*  BUN 5* 6*  CREATININE 0.48 0.47  CALCIUM 7.9* 8.0*   GFR: Estimated Creatinine Clearance: 53 mL/min (by C-G formula based on SCr of 0.47 mg/dL). Liver Function Tests: Recent Labs  Lab 04/25/19 1600  AST 17  ALT 21  ALKPHOS 90  BILITOT 0.5  PROT 5.5*  ALBUMIN 1.7*   No results for input(s): LIPASE, AMYLASE in the last 168 hours. No results for input(s): AMMONIA in the last 168 hours. Coagulation Profile: No results for input(s): INR, PROTIME in the last 168 hours. Cardiac Enzymes: No results for input(s): CKTOTAL, CKMB, CKMBINDEX, TROPONINI in the last  168 hours. BNP (last 3 results) No results for input(s): PROBNP in the last 8760 hours. HbA1C: Recent Labs    04/26/19 0429  HGBA1C 5.3   CBG: No results for input(s): GLUCAP in the last 168 hours. Lipid Profile: Recent Labs    04/26/19 0429  CHOL 81  HDL 40*  LDLCALC 26  TRIG 74  CHOLHDL 2.0   Thyroid Function Tests: No results for input(s): TSH, T4TOTAL, FREET4, T3FREE, THYROIDAB in the last 72 hours. Anemia Panel: No results for input(s): VITAMINB12, FOLATE, FERRITIN, TIBC, IRON, RETICCTPCT in the last 72 hours. Sepsis Labs: No results for input(s): PROCALCITON, LATICACIDVEN in the last 168 hours.  Recent Results (from the past 240 hour(s))  SARS Coronavirus 2 (CEPHEID - Performed in [facility] lab), Hosp Order     Status: None   Collection Time: 04/24/19 12:14 PM   Specimen: Nasopharyngeal Swab  Result Value Ref Range Status   SARS Coronavirus 2 NEGATIVE NEGATIVE Final    Comment: (NOTE) If  result is NEGATIVE SARS-CoV-2 target nucleic acids are NOT DETECTED. The SARS-CoV-2 RNA is generally detectable in upper and lower  respiratory specimens during the acute phase of infection. The lowest  concentration of SARS-CoV-2 viral copies this assay can detect is 250  copies / mL. A negative result does not preclude SARS-CoV-2 infection  and should not be used as the sole basis for treatment or other  patient management decisions.  A negative result may occur with  improper specimen collection / handling, submission of specimen other  than nasopharyngeal swab, presence of viral mutation(s) within the  areas targeted by this assay, and inadequate number of viral copies  (<250 copies / mL). A negative result must be combined with clinical  observations, patient history, and epidemiological information. If result is POSITIVE SARS-CoV-2 target nucleic acids are DETECTED. The SARS-CoV-2 RNA is generally detectable in upper and lower  respiratory specimens dur ing  the acute phase of infection.  Positive  results are indicative of active infection with SARS-CoV-2.  Clinical  correlation with patient history and other diagnostic information is  necessary to determine patient infection status.  Positive results do  not rule out bacterial infection or co-infection with other viruses. If result is PRESUMPTIVE POSTIVE SARS-CoV-2 nucleic acids MAY BE PRESENT.   A presumptive positive result was obtained on the submitted specimen  and confirmed on repeat testing.  While 2019 novel coronavirus  (SARS-CoV-2) nucleic acids may be present in the submitted sample  additional confirmatory testing may be necessary for epidemiological  and / or clinical management purposes  to differentiate between  SARS-CoV-2 and other Sarbecovirus currently known to infect humans.  If clinically indicated additional testing with an alternate test  methodology [phone]) is advised. The SARS-CoV-2 RNA is generally  detectable in upper and lower respiratory sp ecimens during the acute  phase of infection. The expected result is Negative. Fact Sheet for Patients:  StrictlyIdeas.no Fact Sheet for Healthcare Providers: BankingDealers.co.za This test is not yet approved or cleared by the [place] FDA and has been authorized for detection and/or diagnosis of SARS-CoV-2 by FDA under an Emergency Use Authorization (EUA).  This EUA will remain in effect (meaning this test can be used) for the duration of the COVID-19 declaration under Section 564(b)(1) of the Act, 21 U.S.C. section 360bbb-3(b)(1), unless the authorization is terminated or revoked sooner. Performed at [facility], [place] [address] [place], [place] [address]   MRSA PCR Screening     Status: None   Collection Time: 04/25/19 11:29 PM   Specimen: Nasopharyngeal  Result Value Ref Range Status   MRSA by PCR NEGATIVE NEGATIVE Final    Comment:        The GeneXpert  MRSA Assay (FDA approved for NASAL specimens only), is one component of a comprehensive MRSA colonization surveillance program. It is not intended to diagnose MRSA infection nor to guide or monitor treatment for MRSA infections. Performed at [facility], [place] [address] [place], [place] [address]       Radiology Studies: Dg Chest 2 View  Result Date: 04/24/2019 CLINICAL DATA:  Chest pain EXAM: CHEST - 2 VIEW COMPARISON:  03/13/2019 FINDINGS: Upper normal heart size. Mediastinal contours and pulmonary vascularity normal. Bronchitic changes and chronic accentuation of perihilar markings, stable. Mild LEFT basilar atelectasis and question minimal LEFT pleural effusion No acute infiltrate, RIGHT pleural effusion or pneumothorax. Bones demineralized. IMPRESSION: Mild RIGHT basilar atelectasis and question minimal LEFT pleural effusion. Chronic bronchitic changes. Electronically  Signed   By: Ulyses SouthwardMark  Boles M.D.   On: 04/24/2019 11:39   Ct Head Wo Contrast  Result Date: 04/25/2019 CLINICAL DATA:  Altered level of consciousness. Small left parietal white matter infarct seen on recent MRI. EXAM: CT HEAD WITHOUT CONTRAST TECHNIQUE: Contiguous axial images were obtained from the base of the skull through the vertex without intravenous contrast. COMPARISON:  04/24/2019 CT and MRI FINDINGS: Brain: Negative for acute hemorrhage, mass lesion, midline shift or hydrocephalus or large new infarct. Again noted is extensive low density in the white matter. Again noted is a focal area of low density in the right parietal white matter tract compatible with old infarct or ischemia. Vascular: No hyperdense vessel or unexpected calcification. Skull: Normal. Negative for fracture or focal lesion. Sinuses/Orbits: No significant sinus disease. Other: None IMPRESSION: 1. No acute intracranial abnormality. 2. Chronic white matter disease is similar to the recent comparison examination. Findings are suggestive for chronic  small vessel ischemic changes. Electronically Signed   By: Richarda OverlieAdam  Henn M.D.   On: 04/25/2019 17:13   Ct Head Wo Contrast  Result Date: 04/24/2019 CLINICAL DATA:  Ct head wo, complaint of sudden onset chest discomfort slurred speech and slowed mentation. This lasted for a short period of time and then resolved. Patient does not remember anything that happened. She was sent to rule out a.*comment was truncated*Altered level of consciousness (LOC), unexplained EXAM: CT HEAD WITHOUT CONTRAST TECHNIQUE: Contiguous axial images were obtained from the base of the skull through the vertex without intravenous contrast. COMPARISON:  CT 03/29/2017 FINDINGS: Brain: No acute intracranial hemorrhage. No focal mass lesion. No CT evidence of acute infarction. No midline shift or mass effect. No hydrocephalus. Basilar cisterns are patent. There are periventricular and subcortical white matter hypodensities. Focal deep white matter infarction in the RIGHT centrum semiovale. Generalized cortical atrophy. Vascular: No hyperdense vessel or unexpected calcification. Skull: Normal. Negative for fracture or focal lesion. Sinuses/Orbits: No acute finding. Other: None. IMPRESSION: 1. No acute intracranial findings. 2. Atrophy and chronic white matter microvascular disease Electronically Signed   By: Genevive BiStewart  Edmunds M.D.   On: 04/24/2019 16:37   Mr Brain Wo Contrast  Result Date: 04/24/2019 CLINICAL DATA:  Slurred speech and altered mentation. EXAM: MRI HEAD WITHOUT CONTRAST TECHNIQUE: Multiplanar, multiecho pulse sequences of the brain and surrounding structures were obtained without intravenous contrast. COMPARISON:  Head CT 04/24/2019 and MRI/MRA 02/20/2019 FINDINGS: Brain: There is a punctate focus of trace diffusion hyperintensity in the white matter lateral to the atrium of the left lateral ventricle on both axial and coronal acquisitions. No definite reduced ADC is identified, however assessment is limited by its small size.  There is a small amount of residual diffusion abnormality in the right corona radiata at the site of the acute infarct on the prior MRI. Background patchy and confluent T2 hyperintensities in the bilateral cerebral white matter and pons are similar to the prior MRI and nonspecific but compatible with severe chronic small vessel ischemic disease. Chronic lacunar infarcts are again seen in the bilateral basal ganglia, thalami, and corpus callosum. There is moderate cerebral atrophy. Vascular: Major intracranial vascular flow voids are preserved. Known 7 mm right MCA bifurcation aneurysm. Skull and upper cervical spine: Unremarkable bone marrow signal. Sinuses/Orbits: Unremarkable orbits. Clear paranasal sinuses. Trace bilateral mastoid fluid. Other: None. IMPRESSION: 1. 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 2. Severe chronic small vessel ischemic disease. Electronically Signed   By: Sebastian AcheAllen  Grady M.D.   On: 04/24/2019  17:50      LOS: 1 day   Time spent: More than 50% of that time was spent in counseling and/or coordination of care.  Lanae Boast, MD [facility]  04/26/2019, 9:55 AM

## 2019-04-26 NOTE — Progress Notes (Signed)
Attempted MRI as requested. Patient unable to do exam. Complains of hip and leg pain. Did not want to try with meds at this time. RN notified.

## 2019-04-26 NOTE — Procedures (Signed)
EEG Report  Clinical History:  Altered mental status and right arm weakness.  Technical Summary:  A 19 channel digital EEG recording was performed using the 10-20 international system of electrode placement.  Bipolar and Referential montages were used.  The total recording time was approx 20 minutes.  Findings:  There is a posterior dominant rhythm of 9 Hz reactive to eye opening and closure.  No focal slowing is present.  Intermittently, there is generalized theta frequency higher amplitude slowing of brain activity.  There are no epileptiform discharges or electrographic seizures present.  Sleep was not recorded.  Impression:  This is a mildly abnormal EEG.  There is evidence of intermittent moderate slowing of brain activity which is non-specific but may be due to pathological drowsiness, sedative medication effects, or other toxic/metabolic etiologies.  Clinical correlation is recommended.    Rogue Jury, MS, MD

## 2019-04-26 NOTE — Progress Notes (Signed)
  Echocardiogram 2D Echocardiogram has been performed.  Oneal Deputy Redonna Wilbert 04/26/2019, 2:20 PM

## 2019-04-26 NOTE — Plan of Care (Signed)
  Problem: Coping: Goal: Will verbalize positive feelings about self Outcome: Progressing   

## 2019-04-27 MED ORDER — LEVETIRACETAM 250 MG PO TABS
250.0000 mg | ORAL_TABLET | Freq: Two times a day (BID) | ORAL | Status: DC
  Administered 2019-04-27 – 2019-04-28 (×2): 250 mg via ORAL
  Filled 2019-04-27 (×2): qty 1

## 2019-04-27 NOTE — Progress Notes (Addendum)
  Speech Language Pathology Treatment: Cognitive-Linquistic  Patient Details Name: Connie Oconnor MRN: [id number] DOB: September 29, 1941 Today's Date: 04/27/2019 Time: 5427-0623 SLP Time Calculation (min) (ACUTE ONLY): 19 min  Assessment / Plan / Recommendation Clinical Impression  Pt was seen for cognitive-linguistic treatment session and was cooperate throughout the session. She reported that she was fatigued from today's activities but was amenable to participation in an abbreviated session. She denied any deficits in cognition but did stated that she was receiving SLP services prior to admission at the SNF to work on her memory. She demonstrated 50% accuracy with recall of objective information from voice mails increasing to 70% accuracy with mod cues. She achieved 80% accuracy with time management problems increasing to 100% with min-mod cues. She completed a medication management (prescription) task with 100% accuracy. SLP will continue to follow pt.    HPI HPI: Patient is a 78 y.o. female with PMh: SAH in 2018, HTN, acute CVA April 2020 with left sided weakness, gastric ulcer, who presented to ED on 6/23 from SNF with slurred speech and chest pain and was markedly hyptertensive. MRI showed punctate acute to subacute infarct in periventricular white matter, but slurred speech resolved and she was discharged home. on 6/24, patient presented again to ED with apparent episode of transient alteration of consciousness , with right arm weakness and elevated BP. CT was negative. Neurology evaluated and noted that patient has a 7 mm right MCA aneurysm and they believe she most likely had a seizure.      SLP Plan  Continue with current plan of care       Recommendations                   Follow up Recommendations: Skilled Nursing facility SLP Visit Diagnosis: Cognitive communication deficit (J62.831) Plan: Continue with current plan of care       Orvell Careaga I. Hardin Negus, [place], [place] Office number [phone] Pager [place] 04/27/2019, 4:51 PM

## 2019-04-27 NOTE — Care Management Important Message (Signed)
Important Message  Patient Details  Name: Connie Oconnor MRN: [id number] Date of Birth: 21-Jun-1941   Medicare Important Message Given:  Yes     Orbie Pyo 04/27/2019, 2:23 PM

## 2019-04-27 NOTE — TOC Initial Note (Signed)
Transition of Care [facility]) - Initial/Assessment Note    Patient Details  Name: Connie Oconnor MRN: [id number] Date of Birth: 03-May-1941  Transition of Care [facility]) CM/SW Contact:    Geralynn Ochs, LCSW Phone Number: 04/27/2019, 4:32 PM  Clinical Narrative:   Patient admitted from [facility]. CSW met with patient to confirm plan to return and continue SNF. CSW sent information to [facility] for them to initiate authorization through [facility].  Authorization received this afternoon, patient can admit back to SNF tomorrow if stable. CSW to follow. Patient asked CSW to contact Juliann Pulse, her daughter, to inform her of plan to discharge tomorrow.                Expected Discharge Plan: Skilled Nursing Facility Barriers to Discharge: Continued Medical Work up, Ship broker   Patient Goals and CMS Choice Patient states their goals for this hospitalization and ongoing recovery are:: have her hand move better and be able to do more things CMS Medicare.gov Compare Post Acute Care list provided to:: Patient Choice offered to / list presented to : Patient  Expected Discharge Plan and Services Expected Discharge Plan: [place] Choice: [place] Living arrangements for the past 2 months: Single Family Home, [place]                                      Prior Living Arrangements/Services Living arrangements for the past 2 months: [place], [place] Lives with:: Self Patient language and need for interpreter reviewed:: No Do you feel safe going back to the place where you live?: Yes      Need for Family Participation in Patient Care: No (Comment) Care giver support system in place?: Yes (comment)   Criminal Activity/Legal Involvement Pertinent to Current Situation/Hospitalization: No - Comment as needed  Activities of Daily Living Home Assistive Devices/Equipment:  Wheelchair ADL Screening (condition at time of admission) Patient's cognitive ability adequate to safely complete daily activities?: Yes Is the patient deaf or have difficulty hearing?: No Does the patient have difficulty seeing, even when wearing glasses/contacts?: No Does the patient have difficulty concentrating, remembering, or making decisions?: No Patient able to express need for assistance with ADLs?: Yes Does the patient have difficulty dressing or bathing?: Yes Independently performs ADLs?: No Communication: Independent Is this a change from baseline?: Pre-admission baseline Dressing (OT): Needs assistance Is this a change from baseline?: Pre-admission baseline Grooming: Needs assistance Is this a change from baseline?: Pre-admission baseline Feeding: Independent Is this a change from baseline?: Pre-admission baseline Bathing: Needs assistance Is this a change from baseline?: Pre-admission baseline Toileting: Needs assistance Is this a change from baseline?: Pre-admission baseline In/Out Bed: Needs assistance Is this a change from baseline?: Pre-admission baseline Walks in Home: Needs assistance Is this a change from baseline?: Pre-admission baseline Does the patient have difficulty walking or climbing stairs?: Yes Weakness of Legs: Both Weakness of Arms/Hands: Left  Permission Sought/Granted Permission sought to share information with : Facility Sport and exercise psychologist, Family Supports Permission granted to share information with : Yes, Verbal Permission Granted  Share Information with NAME: Juliann Pulse  Permission granted to share info w AGENCY: SNF  Permission granted to share info w Relationship: Daughter     Emotional Assessment Appearance:: Appears stated age Attitude/Demeanor/Rapport: Engaged Affect (typically observed): Pleasant Orientation: : Oriented to Self, Oriented to Place, Oriented  to Situation Alcohol / Substance Use: Not Applicable Psych Involvement: No  (comment)  Admission diagnosis:  Seizure-like activity [facility]) [R56.9] Patient Active Problem List   Diagnosis Date Noted  . Stroke ([place]) 04/25/2019  . GI bleed 04/04/2019  . HLD (hyperlipidemia) 04/04/2019  . Vascular dementia without behavioral disturbance ([place])   . 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 Gastric erosion   . Gastrointestinal hemorrhage   . Acute respiratory failure with hypoxia ([place])   . Hyponatremia   . Tobacco abuse   . Pulmonary emphysema ([place])   . Pulmonary nodules   . Primary osteoarthritis of right hip   . Subacute confusional state   . Lethargy   . Confusion, postoperative   . Slow transit constipation   . Pain of left calf   . Impulsiveness   . Dysphagia, post-stroke   . Labile blood pressure   . Hyperglycemia   . Hypotension   . Hypoalbuminemia due to protein-calorie malnutrition ([place])   . Acute blood loss anemia   . Hypoglycemia   . Essential hypertension   . History of traumatic brain injury   . Lacunar infarct, acute ([place]) 02/23/2019  . 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 Right-sided lacunar infarction ([place]) 02/20/2019  . Hypothyroidism (acquired) 02/20/2019  . Closed fracture of medial portion of right tibial plateau 01/11/2018  . Acute medial meniscal tear, right, initial encounter 01/11/2018  . SAH (subarachnoid hemorrhage) ([place]) 03/29/2017   PCP:  Camille Bal, PA-C Pharmacy:   [id number] [place] [place]Phone: [phone]: [phone]    Social Determinants of Health (SDOH) Interventions    Readmission Risk Interventions Readmission Risk Prevention Plan 04/06/2019  Transportation Screening Complete  Home Care Screening Complete  Some recent data might be hidden

## 2019-04-27 NOTE — [place] FL2 (Signed)
[place] MEDICAID FL2 LEVEL OF CARE SCREENING TOOL     IDENTIFICATION  Patient Name: Connie Oconnor Birthdate: [birth year] Sex: female Admission Date (Current Location): 04/25/2019  [facility] [place]   Facility and Address:  [facility] [place], [address], [place], [place] [address]      Provider Number: [id number]  Attending Physician Name and Address:  Coralie KeensArrien, Mauricio Daniel,*  Relative Name and Phone Number:       Current Level of Care: Hospital Recommended Level of Care: Skilled Nursing Facility Prior Approval Number:    Date Approved/Denied:   PASRR Number: [id number]  Discharge Plan: SNF    Current Diagnoses: Patient Active Problem List   Diagnosis Date Noted  . Stroke (HCC) 04/25/2019  . GI bleed 04/04/2019  . HLD (hyperlipidemia) 04/04/2019  . Vascular dementia without behavioral disturbance (HCC)   . 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 Gastric erosion   . Gastrointestinal hemorrhage   . Acute respiratory failure with hypoxia (HCC)   . Hyponatremia   . Tobacco abuse   . Pulmonary emphysema (HCC)   . Pulmonary nodules   . Primary osteoarthritis of right hip   . Subacute confusional state   . Lethargy   . Confusion, postoperative   . Slow transit constipation   . Pain of left calf   . Impulsiveness   . Dysphagia, post-stroke   . Labile blood pressure   . Hyperglycemia   . Hypotension   . Hypoalbuminemia due to protein-calorie malnutrition (HCC)   . Acute blood loss anemia   . Hypoglycemia   . Essential hypertension   . History of traumatic brain injury   . Lacunar infarct, acute (HCC) 02/23/2019  . 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 Hypothyroidism (acquired) 02/20/2019  . Closed fracture of medial portion of right tibial plateau 01/11/2018  . Acute medial meniscal tear, right, initial encounter 01/11/2018   . SAH (subarachnoid hemorrhage) (HCC) 03/29/2017    Orientation RESPIRATION BLADDER Height & Weight     Self, Situation, Place  Normal Incontinent Weight: 142 lb (64.4 kg) Height:  5\' 5"  (165.1 cm)  BEHAVIORAL SYMPTOMS/MOOD NEUROLOGICAL BOWEL NUTRITION STATUS    Convulsions/Seizures Incontinent Diet(see DC summary)  AMBULATORY STATUS COMMUNICATION OF NEEDS Skin   Extensive Assist Verbally Normal                       Personal Care Assistance Level of Assistance  Bathing, Dressing, Feeding Bathing Assistance: Maximum assistance Feeding assistance: Limited assistance Dressing Assistance: Maximum assistance     Functional Limitations Info  Sight, Hearing, Speech Sight Info: Adequate Hearing Info: Adequate Speech Info: Adequate    SPECIAL CARE FACTORS FREQUENCY  PT (By licensed PT), OT (By licensed OT), Speech therapy     PT Frequency: 5x/wk OT Frequency: 5x/wk     Speech Therapy Frequency: 5x/wk      Contractures Contractures Info: Not present    Additional Factors Info  Code Status, Allergies, Psychotropic Code Status Info: DNR Allergies Info: Lactose Intolerance (Gi), Aspirin, Ensure Nutritional Supplements, Ibuprofen, Penicillins Psychotropic Info: Zoloft 25mg  daily         Current Medications (04/27/2019):  This is the current hospital active medication list Current Facility-Administered Medications  Medication Dose Route Frequency Provider Last Rate Last Dose  . acetaminophen (TYLENOL) tablet 650 mg  650 mg Oral TID Pieter Partridgehatterjee, Srobona Tublu, MD  650 mg at 04/27/19 0816  . acetaminophen (TYLENOL) tablet 650 mg  650 mg Oral Q4H PRN Bonnell Public Tublu, MD      . atorvastatin (LIPITOR) tablet 40 mg  40 mg Oral q1800 Vashti Hey, MD   40 mg at 04/26/19 1823  . clopidogrel (PLAVIX) tablet 75 mg  75 mg Oral Daily Bonnell Public Tublu, MD   75 mg at 04/27/19 0815  . diclofenac sodium (VOLTAREN) 1 % transdermal gel 2 g  2 g Topical  QID Vashti Hey, MD   2 g at 04/27/19 1660  . docusate sodium (COLACE) capsule 100 mg  100 mg Oral Daily PRN Bonnell Public Tublu, MD      . enoxaparin (LOVENOX) injection 40 mg  40 mg Subcutaneous Q24H Bonnell Public Tublu, MD   40 mg at 04/26/19 2129  . feeding supplement (ENSURE ENLIVE) (ENSURE ENLIVE) liquid 237 mL  237 mL Oral BID BM Kc, Ramesh, MD   237 mL at 04/27/19 0817  . gabapentin (NEURONTIN) capsule 100 mg  100 mg Oral TID Bonnell Public Tublu, MD   100 mg at 04/27/19 0817  . levETIRAcetam (KEPPRA) tablet 500 mg  500 mg Oral BID Bonnell Public Tublu, MD   500 mg at 04/27/19 0816  . magnesium oxide (MAG-OX) tablet 400 mg  400 mg Oral BID Bonnell Public Tublu, MD   400 mg at 04/27/19 0816  . Melatonin TABS 6 mg  6 mg Oral QHS Bonnell Public Tublu, MD   6 mg at 04/26/19 2128  . oxyCODONE-acetaminophen (PERCOCET/ROXICET) 5-325 MG per tablet 1 tablet  1 tablet Oral Daily PRN Antonieta Pert, MD   1 tablet at 04/26/19 1512  . pantoprazole (PROTONIX) EC tablet 40 mg  40 mg Oral BID AC Bonnell Public Tublu, MD   40 mg at 04/27/19 0817  . polyethylene glycol (MIRALAX / GLYCOLAX) packet 17 g  17 g Oral BID PRN Bonnell Public Tublu, MD      . senna-docusate (Senokot-S) tablet 2 tablet  2 tablet Oral Daily PRN Bonnell Public Tublu, MD      . sertraline (ZOLOFT) tablet 25 mg  25 mg Oral Daily Bonnell Public Tublu, MD   25 mg at 04/27/19 0816  . sucralfate (CARAFATE) tablet 1 g  1 g Oral TID AC & HS Vashti Hey, MD   1 g at 04/27/19 0617  . traMADol (ULTRAM) tablet 50 mg  50 mg Oral BID PRN Vashti Hey, MD   50 mg at 04/25/19 2222     Discharge Medications: Please see discharge summary for a list of discharge medications.  Relevant Imaging Results:  Relevant Lab Results:   Additional Information SS#: [id number]  Geralynn Ochs, LCSW

## 2019-04-27 NOTE — Progress Notes (Addendum)
PROGRESS NOTE    Elisabel Hanover  OFB:[id number] DOB: May 26, 1941 DOA: 04/25/2019 PCP: Camille Bal, PA-C    Brief Narrative:  78 year old female who presented with a focal neurologic deficit.  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  She was recently diagnosed with ischemic stroke emergency department, discharged home.  Following day patient had altered consciousness and right arm weakness that prompted her to come back to the hospital.  On her initial physical examination blood pressure 131/55, heart rate 66, respiratory rate 15, oxygen saturation 96%, her lungs were clear to auscultation, heart S1-2 present and rhythmic, abdomen soft, no lower extremity edema.  Patient was admitted to the hospital with the working diagnosis of new onset seizures.   Assessment & Plan:   Principal Problem:   Stroke [facility]) Active Problems:   Essential hypertension   History of traumatic brain injury   Vascular dementia without behavioral disturbance (HCC)   HLD (hyperlipidemia)   1. Seizures New onset in the setting of recent CVA. Patient not yet back to baseline, persistent weakness on the left side. No dyspnea or chest pain. Reports somnolence. Follow brain MRI no frank change from previus study in 04/24/19, not able to complete study due to agitation. Echocardiogram with preserved LV systolic function. Will continue keppra 500 mg bid and will follow with neurology recommendations.   2. HTN. Continue blood pressure monitoring, continue to hold on losartan.   3. Dementia. Today with no confusion, but noted agitation at the time of MRI. Will continue neuro checks per unit protocol.  4. History of GI bleeding. Will continue cell count monitoring Hgb has been stable, will continue proton pump inhibitor.    DVT prophylaxis: enoxaparin   Code Status: dnr  Family Communication: no family at the bedside   Disposition Plan/ discharge barriers: pending clinical improvement and no further seizures.   Body mass index is 23.63 kg/m. Malnutrition Type:  Nutrition Problem: Increased nutrient needs Etiology: acute illness   Malnutrition Characteristics:  Signs/Symptoms: estimated needs   Nutrition Interventions:  Interventions: Ensure Enlive (each supplement provides 350kcal and 20 grams of protein)  RN Pressure Injury Documentation:     Consultants:   Neurology   Procedures:     Antimicrobials:       Subjective: Patient is awake and alert, continue to be very weak and deconditioned. Continue to have left sided weakness, left handed.   Objective: Vitals:   04/26/19 2257 04/27/19 0449 04/27/19 0832 04/27/19 1212  BP: (!) 128/59 (!) 157/57 (!) 156/76 (!) 168/71  Pulse: (!) 59 67 71 68  Resp: 18 18 18 18   Temp: 98.2 F (36.8 C) 97.8 F (36.6 C) 98.2 F (36.8 C) 98.3 F (36.8 C)  TempSrc: Oral Oral Oral Oral  SpO2: 97% 96% 97% 100%  Weight:      Height:        Intake/Output Summary (Last 24 hours) at 04/27/2019 1223 Last data filed at 04/26/2019 1800 Gross per 24 hour  Intake 560 ml  Output -  Net 560 ml   Filed Weights   04/25/19 1501  Weight: 64.4 kg    Examination:   General: deconditioned  Neurology: Awake and alert, left sided weakness, 3-4/5 upper and lower. E ENT: mild pallor, no icterus, oral mucosa moist Cardiovascular: No JVD. S1-S2 present, rhythmic, no gallops, rubs, or murmurs. Trace lower extremity edema. Pulmonary: vesicular breath sounds bilaterally, adequate air movement, no  wheezing, rhonchi or rales. Gastrointestinal. Abdomen flat, no organomegaly, non tender, no rebound or guarding Skin. No rashes Musculoskeletal: no joint deformities     Data Reviewed: I have personally reviewed following labs and imaging studies  CBC: Recent Labs  Lab 04/24/19 1140 04/25/19 1600  WBC 5.7 6.3  NEUTROABS 4.5 4.7  HGB 8.4* 7.7*  HCT  28.4* 26.4*  MCV 82.6 82.5  PLT 607* 589*   Basic Metabolic Panel: Recent Labs  Lab 04/24/19 1140 04/25/19 1600  NA 135 133*  K 3.3* 3.3*  CL 100 101  CO2 26 25  GLUCOSE 103* 121*  BUN 5* 6*  CREATININE 0.48 0.47  CALCIUM 7.9* 8.0*   GFR: Estimated Creatinine Clearance: 53 mL/min (by C-G formula based on SCr of 0.47 mg/dL). Liver Function Tests: Recent Labs  Lab 04/25/19 1600  AST 17  ALT 21  ALKPHOS 90  BILITOT 0.5  PROT 5.5*  ALBUMIN 1.7*   No results for input(s): LIPASE, AMYLASE in the last 168 hours. No results for input(s): AMMONIA in the last 168 hours. Coagulation Profile: No results for input(s): INR, PROTIME in the last 168 hours. Cardiac Enzymes: No results for input(s): CKTOTAL, CKMB, CKMBINDEX, TROPONINI in the last 168 hours. BNP (last 3 results) No results for input(s): PROBNP in the last 8760 hours. HbA1C: Recent Labs    04/26/19 0429  HGBA1C 5.3   CBG: No results for input(s): GLUCAP in the last 168 hours. Lipid Profile: Recent Labs    04/26/19 0429  CHOL 81  HDL 40*  LDLCALC 26  TRIG 74  CHOLHDL 2.0   Thyroid Function Tests: No results for input(s): TSH, T4TOTAL, FREET4, T3FREE, THYROIDAB in the last 72 hours. Anemia Panel: No results for input(s): VITAMINB12, FOLATE, FERRITIN, TIBC, IRON, RETICCTPCT in the last 72 hours.    Radiology Studies: I have reviewed all of the imaging during this hospital visit personally     Scheduled Meds: . acetaminophen  650 mg Oral TID  . atorvastatin  40 mg Oral q1800  . clopidogrel  75 mg Oral Daily  . diclofenac sodium  2 g Topical QID  . enoxaparin (LOVENOX) injection  40 mg Subcutaneous Q24H  . feeding supplement (ENSURE ENLIVE)  237 mL Oral BID BM  . gabapentin  100 mg Oral TID  . levETIRAcetam  500 mg Oral BID  . magnesium oxide  400 mg Oral BID  . Melatonin  6 mg Oral QHS  . pantoprazole  40 mg Oral BID AC  . sertraline  25 mg Oral Daily  . sucralfate  1 g Oral TID AC & HS    Continuous Infusions:   LOS: 2 days         Annett Gulaaniel , MD

## 2019-04-27 NOTE — Progress Notes (Signed)
Reason for consult: Seizure  Subjective: No events overnight, patient remains very drowsy.    ROS: negative except above Examination  Vital signs in last 24 hours: Temp:  [97.8 F (36.6 C)-98.3 F (36.8 C)] 98.2 F (36.8 C) (06/26 0832) Pulse Rate:  [59-75] 71 (06/26 0832) Resp:  [18] 18 (06/26 0832) BP: (128-157)/(49-76) 156/76 (06/26 0832) SpO2:  [96 %-97 %] 97 % (06/26 0832)  General: lying in bed CVS: pulse-normal rate and rhythm RS: breathing comfortably Extremities: normal   Neuro: MS: Alert, oriented, follows commands CN: pupils equal and reactive,  EOMI, face symmetric, tongue midline, normal sensation over face, Motor: 5/5 strength in right upper and lower extremity, 4 x 5 strength in the left upper extremity and 3/5 strength with contracture and spasticity in left lower extremity  plantars: flexor Coordination: normal Gait: not tested  Basic Metabolic Panel: Recent Labs  Lab 04/24/19 1140 04/25/19 1600  NA 135 133*  K 3.3* 3.3*  CL 100 101  CO2 26 25  GLUCOSE 103* 121*  BUN 5* 6*  CREATININE 0.48 0.47  CALCIUM 7.9* 8.0*    CBC: Recent Labs  Lab 04/24/19 1140 04/25/19 1600  WBC 5.7 6.3  NEUTROABS 4.5 4.7  HGB 8.4* 7.7*  HCT 28.4* 26.4*  MCV 82.6 82.5  PLT 607* 589*     Coagulation Studies: No results for input(s): LABPROT, INR in the last 72 hours.  Imaging Reviewed:  MRI brain, no acute infarct.   ASSESSMENT AND PLAN  78 year old female with past medical history of stroke in April 2020 with residual left hemiparesis, 7 mm right MCA aneurysm, history of traumatic subarachnoid hemorrhage in 2018, GI bleed, hypertension, history of PVCs.  Presenting to the hospital which is most likely to have been a seizure.  Patient has had no further events since yesterday.  MRI brain repeated, negative for acute infarct.  EEG is mildly abnormal with intermittent moderate slowing, no epileptiform discharges are seen.  Echocardiogram shows no thrombus,  mostly unchanged from previous echo.  Telemetry so far been negative for atrial fibrillation.  Recommendations: - Continue Plavix 75 mg daily - Reduce Keppra to 250 mg twice daily due to excessive drowsiness on 500 mg  - Telemetry and consider discharging patient on Holter monitor --Follow-up with neurology as outpatient   Betsie Peckman [facility] Pager Number [phone] For questions after 7pm please refer to AMION to reach the Neurologist on call

## 2019-04-28 MED ORDER — LEVETIRACETAM 250 MG PO TABS: 250.0000 mg | ORAL_TABLET | Freq: Two times a day (BID) | ORAL | 1 refills | Status: DC

## 2019-04-28 MED ORDER — GABAPENTIN 100 MG PO CAPS: 100.0000 mg | ORAL_CAPSULE | Freq: Two times a day (BID) | ORAL | 1 refills | Status: AC

## 2019-04-28 NOTE — TOC Transition Note (Signed)
Transition of Care [facility]) - CM/SW Discharge Note   Patient Details  Name: Connie Oconnor MRN: [id number] Date of Birth: 1940/11/25  Transition of Care [facility]) CM/SW Contact:  Gelene Mink, [place] Phone Number: 04/28/2019, 10:17 AM   Clinical Narrative:     Patient will DC to: [facility] Anticipated DC date: 04/28/2019 Family notified: Yes Transport by: Corey Harold   Per MD patient ready for DC to . RN, patient, patient's family, and facility notified of DC. Discharge Summary and FL2 sent to facility. RN to call report prior to discharge [phone]). The patient will go to room 36. DC packet on chart. Ambulance transport requested for patient.   CSW will sign off for now as social work intervention is no longer needed. Please consult [place] again if new needs arise.  Kamariya Blevens, LCSW-A [employer]/Clinical Social Work Department Cell: [phone]     Final next level of care: [place] Barriers to Discharge: No Barriers Identified   Patient Goals and CMS Choice Patient states their goals for this hospitalization and ongoing recovery are:: Pt will return back to [facility] CMS Medicare.gov Compare Post Acute Care list provided to:: (Pt returning back to her facility where she arrived from) Choice offered to / list presented to : NA  Discharge Placement   Existing PASRR number confirmed : 04/27/19          Patient [facility] bed at: [facility] Patient to be transferred to facility by: [place] Name of family member notified: Juliann Pulse Patient and family notified of of transfer: 04/28/19  Discharge Plan and Services     Post Acute Care Choice: [place]          DME Arranged: N/A DME Agency: NA       HH Arranged: NA HH Agency: NA        Social Determinants of Health ([place]) Interventions     Readmission Risk Interventions Readmission Risk Prevention Plan 04/06/2019  Transportation Screening Complete  Home Care  Screening Complete  Some recent data might be hidden

## 2019-04-28 NOTE — Discharge Summary (Signed)
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 COVID-19 declaration under Section 564(b)(1) of the Act, 21 U.S.C. section 360bbb-3(b)(1), unless the authorization is terminated or revoked sooner. Performed at [facility], [place] [address], [place], [place] [address]   Novel Coronavirus, NAA (hospital order; send-out to ref lab)     Status: None   Collection Time: 04/25/19  4:39 PM   Specimen: Nasopharyngeal Swab; Respiratory  Result Value Ref Range Status   SARS-CoV-2, NAA NOT DETECTED NOT DETECTED Final    Comment: (NOTE) This test was developed and its performance characteristics determined by [employer]. This test has  not been FDA cleared or approved. This test has been authorized by FDA under an Emergency  Use Authorization (EUA). This test is only authorized for the duration of time the declaration that circumstances exist justifying the authorization of the emergency use of in vitro diagnostic tests for detection of SARS-CoV-2 virus and/or diagnosis of COVID-19 infection under section 564(b)(1) of the Act, 21 U.S.C. 161WRU-0(A)(5), unless the authorization is terminated or revoked sooner. When diagnostic testing is negative, the possibility of a false negative result should be considered in the context of a patient's recent exposures and the presence of clinical signs and symptoms consistent with COVID-19. An individual without symptoms of COVID-19 and who is not shedding SARS-CoV-2 virus would expect to have a negative (not detected) result in this assay. Performed  At: [facility] [address] [place], [place] [id number] Jolene Schimke MD NW:[id number]    Coronavirus Source NASOPHARYNGEAL  Final    Comment: Performed at [facility], [address] [place], [place] [address]  MRSA PCR Screening     Status: None   Collection Time: 04/25/19 11:29 PM   Specimen: Nasopharyngeal  Result Value Ref Range Status   MRSA by PCR NEGATIVE NEGATIVE Final    Comment:        The GeneXpert MRSA Assay (FDA approved for NASAL specimens only), is one component of a comprehensive MRSA colonization surveillance program. It is not intended to diagnose MRSA infection nor to guide or monitor treatment for MRSA infections. Performed at [facility], [address], [place], [place] [address]      Time coordinating discharge:  33 minutes  SIGNED:   Alwyn Ren, MD  [facility] 04/28/2019, 9:15 AM Pager   If 7PM-7AM, please contact night-coverage [email or web address] Password [email or web address]

## 2019-04-28 NOTE — Plan of Care (Signed)
Adequate for discharge.

## 2019-05-01 ENCOUNTER — Ambulatory Visit (HOSPITAL_COMMUNITY)
Admission: RE | Admit: 2019-05-01 | Discharge: 2019-05-01 | Disposition: A | Discharge status: Home / Self Care | Payer: Medicare Other | Source: Ambulatory Visit | Attending: Interventional Radiology | Admitting: Interventional Radiology

## 2019-05-01 ENCOUNTER — Inpatient Hospital Stay: Payer: Self-pay | Admitting: Adult Health

## 2019-05-01 ENCOUNTER — Encounter: Payer: Self-pay | Admitting: Neurology

## 2019-05-01 ENCOUNTER — Ambulatory Visit (INDEPENDENT_AMBULATORY_CARE_PROVIDER_SITE_OTHER): Payer: Medicare Other | Admitting: Neurology

## 2019-05-01 ENCOUNTER — Other Ambulatory Visit: Payer: Self-pay

## 2019-05-01 DIAGNOSIS — I6381 Other cerebral infarction due to occlusion or stenosis of small artery: Secondary | ICD-10-CM | POA: Diagnosis not present

## 2019-05-01 DIAGNOSIS — R569 Unspecified convulsions: Secondary | ICD-10-CM | POA: Diagnosis not present

## 2019-05-01 DIAGNOSIS — I639 Cerebral infarction, unspecified: Secondary | ICD-10-CM

## 2019-05-01 NOTE — Telephone Encounter (Signed)
I called Juliann Pulse at [employer] [employer] she advise to text link to current RN on duty.Caryl Pina was not in today.

## 2019-05-01 NOTE — Progress Notes (Signed)
Virtual Visit via Video Note  I connected with Connie Oconnor on 05/01/19 at 10:00 AM EDT by a video enabled telemedicine application and verified that I am speaking with the correct person using two identifiers.  Location: Patient: at SNF rehab unit Provider: at [facility] office  Referring physician Dr. Erlinda Hong Reason for referral :stroke and seizure I discussed the limitations of evaluation and management by telemedicine and the availability of in person appointments. The patient expressed understanding and agreed to proceed.  This visit was initially scheduled using doxy.me app but due to technical difficulties was switched to face time for audio and visual.  Meeting was facilitated by nurse Salvadore Farber who was present next to the patient throughout this visit.  History of Present Illness: Connie Oconnor is a 78 year old Caucasian lady with past medical history of traumatic subarachnoid hemorrhage in 2018, GI bleed, hypertension, history of PVCs and arthritis who presented to [facility] on [facility]/2019 with sudden onset of dysarthria and elevated blood pressure.  She presented beyond time window for thrombolysis.  MRI scan showed right periventricular white matter infarct.  On exam she had slurred speech, left nasolabial fold flattening and left hemiparesis 3-4/5 strength.  MRI of the brain showed a 7 mm right MCA aneurysm.  MRA of the neck showed only mild stenosis of the left ICA bifurcation.  2D echo showed normal ejection fraction.  LDL cholesterol 98 mg percent.  Hemoglobin A1c was 5.5.  Patient was started on aspirin and Plavix for 3 weeks but developed GI bleed and was switched to Plavix alone.  She had diagnostic cerebral catheter angiogram performed which confirmed 7 mm right MCA aneurysm.  It was decided to treat this conservatively.  She was discharged home but returned on 04/24/2019 with slurred speech and had another MRI scan which showed a tiny left periventricular lacunar infarct from small  vessel disease.  Medication regimen was not changed.  She was discharged home but returned the very next day with an episode of unresponsiveness.  She was described by weakness with eyes rolling upwards and weakness of the right arm which lasted 5 minutes.  EMS was called but symptoms were quickly resolving.  Blood pressure was slightly elevated at 190/90.  She was thought to have a seizure.  She is started on Keppra 250 twice daily.  EEG was obtained which showed mild generalized slowing without definite epileptiform activity.  Patient has been getting rehab in a nursing facility since then.  She states she has significant pain in the right leg from arthritis.  She has not been able to get up and walk even with assistance.  She states her left-sided strength is slightly improving but still not back to baseline.  She has had no further seizure-like episodes.  She is on Plavix and tolerating it well without bruising or bleeding.   Observations/Objective: Physical and neurological exams are limited due to constraints from virtual video visit.  Pleasant awake alert elderly Caucasian lady not in distress lying comfortably in bed.  She has mild dysarthria but can be easily understood.  She follows commands well.  She is oriented to time place and person.  She can tell me correctly the name of the president, month and the year.  She follows 1 and two-step commands.  Extraocular movements are full range without nystagmus.  There is mild left lower facial asymmetry when she smiles.  Tongue is midline.  Motor system exam shows left upper extremity drift with 3/4 strength left upper extremity  with weakness of grip and intrinsic hand muscles.  Lower extremity strength testing is limited due to arthritic pain.  She can move her right lower extremity partially against gravity but pain limits it.  She has significant left leg weakness but can move her ankle and toes only against gravity.  She has decreased sensation on the  left body compared to the right.  Gait was not tested.  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  She also had an episode of possible seizure.  She has incidental 7 mm right MCA aneurysm which is asymptomatic.  Remote history of traumatic subarachnoid hemorrhage in 2018.  Vascular risk factors of hypertension, hyperlipidemia small vessel disease and prior stroke. Plan : I had a long discussion with the patient and her nurse at the bedside regarding recent lacunar infarcts, seizure, asymptomatic right MCA aneurysm and discuss plan of care and answered questions.  I recommend she stay on Plavix for stroke prevention and maintain aggressive risk factor modification with strict control of hypertension with blood pressure goal below 130/90, lipids with LDL cholesterol goal below 70 mg percent and diabetes with hemoglobin A1c goal below 6.5%.  She was not encouraged to eat a healthy diet and to continue ongoing therapies.  She was advised to stay on current dose of Keppra 250 twice daily for seizure prevention as well.  I agree with conservative follow-up for her 7 mm right MCA aneurysm tell at least her neurological status improved significantly more.  She voiced understanding.  I also called and left a message for the patient's daughter Connie Oconnor [phone]( [phone]) to call me back to discuss her neurological condition.   Follow Up Instructions: Return for follow-up in the future for in person visit in 2 to 3 months or call earlier if necessary    I discussed the assessment and treatment plan with the patient. The patient was provided an opportunity to ask questions and all were answered. The patient agreed with the plan and demonstrated an understanding of the instructions.   The patient was advised to call back or seek an  in-person evaluation if the symptoms worsen or if the condition fails to improve as anticipated.  I provided 45minutes of non-face-to-face time during this encounter.   Delia HeadyPramod , MD

## 2019-05-01 NOTE — Progress Notes (Signed)
Chief Complaint: Patient was seen in consultation today for right MCA bifurcation aneurysm.  Referring Physician(s): Rosalin Hawking  Supervising Physician: Luanne Bras  Patient Status: [facility] - Out-pt  History of Present Illness: Connie Oconnor is a 78 y.o. 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 She is known to [facility] and has been followed by Dr. Estanislado Pandy since 01/2019. She first presented to our department at the request of Dr. Erlinda Hong for management of an incidental finding of a right MCA bifurcation aneurysm that was found during admission to [facility] (02/20/2019 to 02/23/2019) for management of an acute CVA. During this admission, patient underwent an image-guided diagnostic cerebral arteriogram 02/22/2019 by Dr. Estanislado Pandy which confirmed the presence of her right MCA bifurcation aneurysm. She was discharged to CIR 02/23/2019 (discharged from [facility] 03/27/2019) with plans to follow-up on outpatient basis to discuss findings of diagnostic cerebral arteriogram.  Diagnostic cerebral arteriogram 02/22/2019: 1. Approximately 8.1 mm x 7.7 mm bilobed right MCA bifurcation aneurysm. 2. Approximately 50% stenosis of the left posterior cerebral artery in the P1 P2 junction. 3. 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  Patient presents today for follow-up to discuss findings of her recent diagnostic cerebral arteriogram 02/22/2019. Patient awake and alert sitting in wheelchair. Accompanied by daughter, Juliann Pulse. Complains of left-sided weakness, residual and stable since prior stroke 01/2019. Denies headache, numbness/tingling, dizziness, vision changes, hearing changes, tinnitus, or speech difficulty.  Patient is currently taking Plavix 75 mg once daily.   Past Medical History:  Diagnosis Date   Anemia    hx of   Arthritis    Dislocation  of right knee with medial meniscus tear    Headache    hx of migraines none since 1990's   Hypothyroidism    1978, no problems since   Pneumonia 1990's none since   PVC (premature ventricular contraction)    199'0's none since   SAH (subarachnoid hemorrhage) ([place]) 2016   On the right   Stroke [place])     Past Surgical History:  Procedure Laterality Date   BACK SURGERY     CERVICAL Diagonal Right 1993   ESOPHAGOGASTRODUODENOSCOPY N/A 03/20/2019   Procedure: ESOPHAGOGASTRODUODENOSCOPY (EGD);  Surgeon: Thornton Park, MD;  Location: [place];  Service: Gastroenterology;  Laterality: N/A;   IR ANGIO INTRA EXTRACRAN SEL COM CAROTID INNOMINATE BILAT MOD SED  02/22/2019   IR ANGIO VERTEBRAL SEL SUBCLAVIAN INNOMINATE UNI L MOD SED  02/22/2019   IR ANGIO VERTEBRAL SEL VERTEBRAL UNI R MOD SED  02/22/2019   KNEE ARTHROSCOPY WITH SUBCHONDROPLASTY Right 01/11/2018   Procedure: KNEE ARTHROSCOPY WITH SUBCHONDROPLASTY, PARTIAL MEDIAL MENISCECTOMY, CHONDROPLASTY OF THE MEDIAL PATELLA FEMORAL CHONDYL, MEDIAL PLICA  EXCISION;  Surgeon: Dorna Leitz, MD;  Location: [facility];  Service: Orthopedics;  Laterality: Right;   LUMBAR LAMINECTOMY  1993   rods placed bone generated removed one year later    MULTIPLE TOOTH EXTRACTIONS     SHOULDER SURGERY Left    arthritis clean out and shortened collar bone dr Eddie Dibbles   TONSILLECTOMY  1970   VAGINAL HYSTERECTOMY  1971   partial dr Hansel Feinstein TOOTH EXTRACTION      Allergies: Lactose intolerance (gi), Aspirin, Ensure [nutritional supplements], Ibuprofen, and Penicillins  Medications: Prior to Admission medications   Medication Sig Start Date End Date Taking? Authorizing  Provider  acetaminophen (TYLENOL) 325 MG tablet Take 2 tablets (650 mg total) by mouth every 4 (four) hours as needed for mild pain (or temp > 37.5 C (99.5 F)). Patient taking differently: Take 650 mg  by mouth See admin instructions. Take 650 mg by mouth three times a day and 650 mg every four hours as needed for mild pain or temp > 99.5 F- NOT TO EXCEED 4,000 MG IN HOURS 03/27/19   Angiulli, Mcarthur Rossettianiel J, PA-C  atorvastatin (LIPITOR) 40 MG tablet Take 1 tablet (40 mg total) by mouth daily at 6 PM. 02/23/19   Azucena FallenLancaster, William C, MD  clopidogrel (PLAVIX) 75 MG tablet Take 1 tablet (75 mg total) by mouth daily. 02/24/19   Azucena FallenLancaster, William C, MD  diclofenac sodium (VOLTAREN) 1 % GEL Apply 2 g topically 4 (four) times daily. Patient taking differently: Apply 2 g topically See admin instructions. Apply 2 grams to right hip four times a day 03/27/19   Angiulli, Mcarthur Rossettianiel J, PA-C  docusate sodium (COLACE) 100 MG capsule Take 100 mg by mouth daily as needed for mild constipation.     [provider]  ferrous sulfate 325 (65 FE) MG EC tablet Take 325 mg by mouth daily with breakfast.    [provider]  gabapentin (NEURONTIN) 100 MG capsule Take 1 capsule (100 mg total) by mouth 2 (two) times daily. 04/28/19   Alwyn RenMathews, Elizabeth G, MD  levETIRAcetam (KEPPRA) 250 MG tablet Take 1 tablet (250 mg total) by mouth 2 (two) times daily. 04/28/19   Alwyn RenMathews, Elizabeth G, MD  losartan (COZAAR) 25 MG tablet Take 1 tablet (25 mg total) by mouth daily. 03/27/19   Angiulli, Mcarthur Rossettianiel J, PA-C  magnesium oxide (MAG-OX) 400 MG tablet Take 400 mg by mouth 2 (two) times daily.     [provider]  Melatonin 3 MG TABS Take 6 mg by mouth at bedtime.    [provider]  Multiple Vitamins-Minerals (MULTIVITAMINS THER. W/MINERALS) TABS tablet Take 1 tablet by mouth daily with breakfast.    [provider]  pantoprazole (PROTONIX) 40 MG tablet Take 1 tablet (40 mg total) by mouth 2 (two) times daily before a meal. Patient taking differently: Take 40 mg by mouth 2 (two) times daily before a meal. FOR 8 WEEKS- 04/09/2019 through 05/22/2019 03/27/19   Angiulli, Mcarthur Rossettianiel J, PA-C  polyethylene glycol  (MIRALAX / GLYCOLAX) 17 g packet Take 17 g by mouth 2 (two) times daily. Patient taking differently: Take 17 g by mouth 2 (two) times daily as needed for mild constipation (and hold for loose stools).  03/27/19   Angiulli, Mcarthur Rossettianiel J, PA-C  senna-docusate (SENNA-PLUS) 8.6-50 MG tablet Take 2 tablets by mouth daily as needed for mild constipation.     [provider]  sertraline (ZOLOFT) 25 MG tablet Take 25 mg by mouth daily.    [provider]  sucralfate (CARAFATE) 1 g tablet Take 1 g by mouth 4 (four) times daily. FOR 4 WEEKS 04/13/19 05/07/19  [provider]     Family History  Problem Relation Age of Onset   Stroke Mother    Heart attack Father     Social History   Socioeconomic History   Marital status: Widowed    Spouse name: Not on file   Number of children: Not on file   Years of education: Not on file   Highest education level: Not on file  Occupational History   Occupation: retired  Environmental managerocial Needs   Financial  resource strain: Not on file   Food insecurity    Worry: Not on file    Inability: Not on file   Transportation needs    Medical: Not on file    Non-medical: Not on file  Tobacco Use   Smoking status: Current Every Day Smoker    Packs/day: 1.00    Years: 72.00    Pack years: 72.00    Types: Cigarettes   Smokeless tobacco: Never Used  Substance and Sexual Activity   Alcohol use: No   Drug use: No   Sexual activity: Not on file  Lifestyle   Physical activity    Days per week: Not on file    Minutes per session: Not on file   Stress: Not on file  Relationships   Social connections    Talks on phone: Not on file    Gets together: Not on file    Attends religious service: Not on file    Active member of club or organization: Not on file    Attends meetings of clubs or organizations: Not on file    Relationship status: Not on file  Other Topics Concern   Not on file  Social History Narrative   Not on file      Review of Systems: A 12 point ROS discussed and pertinent positives are indicated in the HPI above.  All other systems are negative.  Review of Systems  Constitutional: Negative for chills and fever.  HENT: Negative for hearing loss and tinnitus.   Eyes: Negative for visual disturbance.  Respiratory: Negative for shortness of breath and wheezing.   Cardiovascular: Negative for chest pain and palpitations.  Neurological: Positive for weakness. Negative for dizziness, speech difficulty, numbness and headaches.  Psychiatric/Behavioral: Negative for behavioral problems and confusion.     Physical Exam Constitutional:      General: She is not in acute distress.    Appearance: Normal appearance.  Pulmonary:     Effort: Pulmonary effort is normal. No respiratory distress.  Skin:    General: Skin is warm and dry.  Neurological:     Mental Status: She is alert and oriented to person, place, and time.  Psychiatric:        Mood and Affect: Mood normal.        Behavior: Behavior normal.        Thought Content: Thought content normal.        Judgment: Judgment normal.      Imaging: Dg Chest 2 View  Result Date: 04/24/2019 CLINICAL DATA:  Chest pain EXAM: CHEST - 2 VIEW COMPARISON:  03/13/2019 FINDINGS: Upper normal heart size. Mediastinal contours and pulmonary vascularity normal. Bronchitic changes and chronic accentuation of perihilar markings, stable. Mild LEFT basilar atelectasis and question minimal LEFT pleural effusion No acute infiltrate, RIGHT pleural effusion or pneumothorax. Bones demineralized. IMPRESSION: Mild RIGHT basilar atelectasis and question minimal LEFT pleural effusion. Chronic bronchitic changes. Electronically Signed   By: Ulyses Southward M.D.   On: 04/24/2019 11:39   Ct Head Wo Contrast  Result Date: 04/25/2019 CLINICAL DATA:  Altered level of consciousness. Small left parietal white matter infarct seen on recent MRI. EXAM: CT HEAD WITHOUT CONTRAST TECHNIQUE:  Contiguous axial images were obtained from the base of the skull through the vertex without intravenous contrast. COMPARISON:  04/24/2019 CT and MRI FINDINGS: Brain: Negative for acute hemorrhage, mass lesion, midline shift or hydrocephalus or large new infarct. Again noted is extensive low density in the white matter. Again noted  is a focal area of low density in the right parietal white matter tract compatible with old infarct or ischemia. Vascular: No hyperdense vessel or unexpected calcification. Skull: Normal. Negative for fracture or focal lesion. Sinuses/Orbits: No significant sinus disease. Other: None IMPRESSION: 1. No acute intracranial abnormality. 2. Chronic white matter disease is similar to the recent comparison examination. Findings are suggestive for chronic small vessel ischemic changes. Electronically Signed   By: Richarda OverlieAdam  Henn M.D.   On: 04/25/2019 17:13   Ct Head Wo Contrast  Result Date: 04/24/2019 CLINICAL DATA:  Ct head wo, complaint of sudden onset chest discomfort slurred speech and slowed mentation. This lasted for a short period of time and then resolved. Patient does not remember anything that happened. She was sent to rule out a.*comment was truncated*Altered level of consciousness (LOC), unexplained EXAM: CT HEAD WITHOUT CONTRAST TECHNIQUE: Contiguous axial images were obtained from the base of the skull through the vertex without intravenous contrast. COMPARISON:  CT 03/29/2017 FINDINGS: Brain: No acute intracranial hemorrhage. No focal mass lesion. No CT evidence of acute infarction. No midline shift or mass effect. No hydrocephalus. Basilar cisterns are patent. There are periventricular and subcortical white matter hypodensities. Focal deep white matter infarction in the RIGHT centrum semiovale. Generalized cortical atrophy. Vascular: No hyperdense vessel or unexpected calcification. Skull: Normal. Negative for fracture or focal lesion. Sinuses/Orbits: No acute finding. Other: None.  IMPRESSION: 1. No acute intracranial findings. 2. Atrophy and chronic white matter microvascular disease Electronically Signed   By: Genevive BiStewart  Edmunds M.D.   On: 04/24/2019 16:37   Mr Brain Wo Contrast  Result Date: 04/26/2019 CLINICAL DATA:  78 year old female status post right corona radiata infarct in April with residual left hemiparesis. Right MCA aneurysm. Possible seizure. EXAM: MRI HEAD WITHOUT CONTRAST TECHNIQUE: Multiplanar, multiecho pulse sequences of the brain and surrounding structures were obtained without intravenous contrast. COMPARISON:  Brain MRI 04/24/2019 and earlier. FINDINGS: The examination had to be discontinued prior to completion due to patient agitation. Axial diffusion, axial T2, FLAIR, and SWI only was obtained. Brain: Residual diffusion abnormality in the right corona radiata and the recently demonstrated punctate area in the left periatrial white matter have not significantly changed. No new restricted diffusion. No midline shift, mass effect, evidence of mass lesion, ventriculomegaly, extra-axial collection or acute intracranial hemorrhage. Cervicomedullary junction and pituitary are within normal limits. Incidental choroid plexus cysts. Stable confluent bilateral cerebral white matter T2 and FLAIR hyperintensity, extensive bilateral deep gray matter nuclei T2 heterogeneity, and similar T2 heterogeneity in the pons. No new signal abnormality identified. Chronic microhemorrhage in the lateral right occipital lobe is stable. No other chronic blood products identified. Vascular: Major intracranial vascular flow voids are stable. Right MCA aneurysm redemonstrated on series 12, image 13. Sinuses/Orbits: Stable, negative. Other: Mastoids remain clear. Visible internal auditory structures appear normal. IMPRESSION: Discontinued exam prior to completion due to patient agitation, but stable noncontrast MRI appearance of the brain from the recent 04/24/2019 exam. Electronically Signed   By:  Odessa FlemingH  Hall M.D.   On: 04/26/2019 17:17   Mr Brain Wo Contrast  Result Date: 04/24/2019 CLINICAL DATA:  Slurred speech and altered mentation. EXAM: MRI HEAD WITHOUT CONTRAST TECHNIQUE: Multiplanar, multiecho pulse sequences of the brain and surrounding structures were obtained without intravenous contrast. COMPARISON:  Head CT 04/24/2019 and MRI/MRA 02/20/2019 FINDINGS: Brain: There is a punctate focus of trace diffusion hyperintensity in the white matter lateral to the atrium of the left lateral ventricle on both axial and coronal acquisitions.  No definite reduced ADC is identified, however assessment is limited by its small size. There is a small amount of residual diffusion abnormality in the right corona radiata at the site of the acute infarct on the prior MRI. Background patchy and confluent T2 hyperintensities in the bilateral cerebral white matter and pons are similar to the prior MRI and nonspecific but compatible with severe chronic small vessel ischemic disease. Chronic lacunar infarcts are again seen in the bilateral basal ganglia, thalami, and corpus callosum. There is moderate cerebral atrophy. Vascular: Major intracranial vascular flow voids are preserved. Known 7 mm right MCA bifurcation aneurysm. Skull and upper cervical spine: Unremarkable bone marrow signal. Sinuses/Orbits: Unremarkable orbits. Clear paranasal sinuses. Trace bilateral mastoid fluid. Other: None. IMPRESSION: 1. 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 2. Severe chronic small vessel ischemic disease. Electronically Signed   By: Sebastian AcheAllen  Grady M.D.   On: 04/24/2019 17:50    Labs:  CBC: Recent Labs    04/08/19 0538 04/09/19 0532 04/24/19 1140 04/25/19 1600  WBC 6.2 6.6 5.7 6.3  HGB 7.7* 7.5* 8.4* 7.7*  HCT 25.7* 25.7* 28.4* 26.4*  PLT 470* 443* 607* 589*    COAGS: Recent Labs    02/20/19 1242  INR 1.0  APTT 28    BMP: Recent Labs    04/08/19 0538 04/09/19 0532 04/24/19 1140  04/25/19 1600  NA 134* 132* 135 133*  K 2.7* 3.8 3.3* 3.3*  CL 104 101 100 101  CO2 23 24 26 25   GLUCOSE 100* 99 103* 121*  BUN <5* 5* 5* 6*  CALCIUM 7.7* 7.7* 7.9* 8.0*  CREATININE 0.40* 0.34* 0.48 0.47  GFRNONAA >60 >60 >60 >60  GFRAA >60 >60 >60 >60    LIVER FUNCTION TESTS: Recent Labs    03/16/19 0238 03/19/19 1249 03/26/19 1107 04/25/19 1600  BILITOT 0.5 0.4 0.4 0.5  AST 62* 44* 36 17  ALT 99* 75* 99* 21  ALKPHOS 79 71 84 90  PROT 5.8* 5.9* 6.7 5.5*  ALBUMIN 1.6* 1.4* 1.6* 1.7*     Assessment and Plan:  Right MCA bifurcation aneurysm. Dr. Corliss Skainseveshwar was present for consultation. Discussed patient's right MCA bifurcation. Explained nature of aneurysms, including risk of rupture. Explain that there are two management options moving forward- either routine imaging scans to monitor for changes or a procedure for endovascular embolization. Discussed procedure, including risks and benefits. Patient and daughter express desire to move forward with procedure.  Discussed patient's overall health. States that she is "somewhat depressed" and has a lack of appetite. States that she was doing well with PT/OT, but since COVID she has not had PT/OT and has declined. Modified Rankin- 2. Recommend that patient gains weight/strength prior to proceeding with procedure- recommend that patient able to feed self/ambulate (with walker if needed) prior to proceeding with procedure. Instructed patient to follow-up with her rehabilitation physician regularly.  Discussed patient's PMH. Patient states that she was recently diagnosed with seizures last week, states that she has had two seizures and is stable on Keppra. Recommend that patient follow-up with neurology regularly to ensure that she is seizure free prior to procedure. States that she has hx bleeding gastric ulcer confirmed on EGD, hgb stable at this point. Recommend that patient follow-up with GI to ensure PUD stable prior to  procedure.  Plan for follow-up in 6 weeks- our office to call patient to see how she is progressing with instructions above (assess for possible treatment). Informed patient that we will call  her in 6 weeks to discuss progress. Instructed patient to continue taking Plavix 75 mg once daily.  All questions answered and concerns addressed. Patient and daughter convey understanding and agree with plan.  Thank you for this interesting consult.  I greatly enjoyed meeting Adilee Lemme and look forward to participating in their care.  A copy of this report was sent to the requesting provider on this date.  Electronically Signed: Elwin Mocha, PA-C 05/01/2019, 8:19 AM   I spent a total of 40 Minutes in face to face in clinical consultation, greater than 50% of which was counseling/coordinating care for right MCA bifurcation aneurysm.

## 2019-05-02 ENCOUNTER — Encounter: Payer: Medicare Other | Attending: Physical Medicine & Rehabilitation | Admitting: Physical Medicine & Rehabilitation

## 2019-05-02 ENCOUNTER — Encounter: Payer: Self-pay | Admitting: Physical Medicine & Rehabilitation

## 2019-05-02 VITALS — BP 138/64 | HR 62 | Temp 97.7°F | Ht 65.0 in | Wt 142.0 lb

## 2019-05-02 DIAGNOSIS — Z87820 Personal history of traumatic brain injury: Secondary | ICD-10-CM

## 2019-05-02 DIAGNOSIS — J439 Emphysema, unspecified: Secondary | ICD-10-CM | POA: Insufficient documentation

## 2019-05-02 DIAGNOSIS — I69359 Hemiplegia and hemiparesis following cerebral infarction affecting unspecified side: Secondary | ICD-10-CM | POA: Insufficient documentation

## 2019-05-02 DIAGNOSIS — F0150 Vascular dementia without behavioral disturbance: Secondary | ICD-10-CM | POA: Insufficient documentation

## 2019-05-02 DIAGNOSIS — R569 Unspecified convulsions: Secondary | ICD-10-CM

## 2019-05-02 DIAGNOSIS — D62 Acute posthemorrhagic anemia: Secondary | ICD-10-CM | POA: Insufficient documentation

## 2019-05-02 DIAGNOSIS — K922 Gastrointestinal hemorrhage, unspecified: Secondary | ICD-10-CM | POA: Insufficient documentation

## 2019-05-02 DIAGNOSIS — M1611 Unilateral primary osteoarthritis, right hip: Secondary | ICD-10-CM | POA: Insufficient documentation

## 2019-05-02 NOTE — Progress Notes (Addendum)
Subjective:    Patient ID: Connie Oconnor, female    DOB: Sep 28, 1941, 78 y.o.   MRN: [id number]  HPI Right-handed female with past medical history of right SAH from a fall in May 2018, hypothyroidism, tobacco abuse, back surgery presents for hospital follow up after receiving CIR for right corona radiata infarction   Since discharge, she went to the hospital x2, for ABLA and then had pressure sore and then for seizures.  Daughter provides history. She saw VIR as well, notes reviewed, aneurysmal repair on hold.  No follow up for lung nodules. She is on Plavix alone. She saw Neuro as well. She is to follow up with GI as well. She is still at [facility].  Dysphagia has resolved. BP is relatively controlled. Bowel movements have normalized. Continues to have hip pain, she is awaiting injection.  Therapies: at SNF Mobility: Wheelchair at all times  Pain Inventory Average Pain 8 Pain Right Now 8 My pain is constant  In the last 24 hours, has pain interfered with the following? General activity 8 Relation with others 8 Enjoyment of life 10 What TIME of day is your pain at its worst? morning Sleep (in general) Fair  Pain is worse with: walking, bending, sitting, inactivity and standing Pain improves with: na Relief from Meds: 8  Mobility how many minutes can you walk? 0 ability to climb steps?  no do you drive?  no use a wheelchair needs help with transfers  Function retired I need assistance with the following:  dressing, bathing and toileting  Neuro/Psych bladder control problems bowel control problems weakness trouble walking depression anxiety  Prior Studies Any changes since last visit?  yes x-rays CT/MRI  Physicians involved in your care Neurologist Dr. Leonie Man Dr. Patrecia Pour   Family History  Problem Relation Age of Onset  . Stroke Mother   . Heart attack Father    Social History   Socioeconomic History  . Marital status: Widowed    Spouse name: Not on file   . Number of children: Not on file  . Years of education: Not on file  . Highest education level: Not on file  Occupational History  . Occupation: retired  Scientific laboratory technician  . Financial resource strain: Not on file  . Food insecurity    Worry: Not on file    Inability: Not on file  . Transportation needs    Medical: Not on file    Non-medical: Not on file  Tobacco Use  . Smoking status: Current Every Day Smoker    Packs/day: 1.00    Years: 72.00    Pack years: 72.00    Types: Cigarettes  . Smokeless tobacco: Never Used  Substance and Sexual Activity  . Alcohol use: No  . Drug use: No  . Sexual activity: Not on file  Lifestyle  . Physical activity    Days per week: Not on file    Minutes per session: Not on file  . Stress: Not on file  Relationships  . Social Herbalist on phone: Not on file    Gets together: Not on file    Attends religious service: Not on file    Active member of club or organization: Not on file    Attends meetings of clubs or organizations: Not on file    Relationship status: Not on file  Other Topics Concern  . Not on file  Social History Narrative  . Not on file   Past Surgical History:  Procedure Laterality Date  . BACK SURGERY    . CERVICAL LAMINECTOMY  1986  . DILATION AND CURETTAGE OF UTERUS  1970  . ELBOW SURGERY Right 1993  . ESOPHAGOGASTRODUODENOSCOPY N/A 03/20/2019   Procedure: ESOPHAGOGASTRODUODENOSCOPY (EGD);  Surgeon: Tressia DanasBeavers, Kimberly, MD;  Location: [facility];  Service: Gastroenterology;  Laterality: N/A;  . IR ANGIO INTRA EXTRACRAN SEL COM CAROTID INNOMINATE BILAT MOD SED  02/22/2019  . IR ANGIO VERTEBRAL SEL SUBCLAVIAN INNOMINATE UNI L MOD SED  02/22/2019  . IR ANGIO VERTEBRAL SEL VERTEBRAL UNI R MOD SED  02/22/2019  . KNEE ARTHROSCOPY WITH SUBCHONDROPLASTY Right 01/11/2018   Procedure: KNEE ARTHROSCOPY WITH SUBCHONDROPLASTY, PARTIAL MEDIAL MENISCECTOMY, CHONDROPLASTY OF THE MEDIAL PATELLA FEMORAL CHONDYL, MEDIAL PLICA   EXCISION;  Surgeon: Jodi GeraldsGraves, John, MD;  Location: [facility];  Service: Orthopedics;  Laterality: Right;  . LUMBAR LAMINECTOMY  1993   rods placed bone generated removed one year later   . MULTIPLE TOOTH EXTRACTIONS    . SHOULDER SURGERY Left    arthritis clean out and shortened collar bone dr Renae Ficklepaul  . TONSILLECTOMY  1970  . VAGINAL HYSTERECTOMY  1971   partial dr Dewaine Congerbarker  . WISDOM TOOTH EXTRACTION     Past Medical History:  Diagnosis Date  . Anemia    hx of  . Arthritis   . Dislocation of right knee with medial meniscus tear   . Headache    hx of migraines none since 1990's  . Hypothyroidism    1978, no problems since  . Pneumonia 1990's none since  . PVC (premature ventricular contraction)    199'0's none since  . SAH (subarachnoid hemorrhage) (HCC) 2016   On the right  . Stroke (HCC)    BP 138/64   Pulse 62   Temp 97.7 F (36.5 C)   Ht 5\' 5"  (1.651 m)   Wt 142 lb (64.4 kg)   SpO2 92%   BMI 23.63 kg/m   Opioid Risk Score:   Fall Risk Score:  `1  Depression screen PHQ 2/9  Depression screen PHQ 2/9 05/02/2019  Decreased Interest 1  Down, Depressed, Hopeless 1  PHQ - 2 Score 2  Altered sleeping 1  Change in appetite 1  Feeling bad or failure about yourself  1  Trouble concentrating 0  Moving slowly or fidgety/restless 1  Suicidal thoughts 1  PHQ-9 Score 7  Difficult doing work/chores Not difficult at all     Review of Systems  Constitutional: Positive for appetite change and unexpected weight change.  HENT: Negative.   Eyes: Negative.  Negative for visual disturbance.  Gastrointestinal: Positive for abdominal pain, constipation and nausea.  Endocrine: Negative.   Genitourinary: Negative.   Musculoskeletal: Negative.   Allergic/Immunologic: Negative.   Neurological: Negative.   Hematological: Negative.   Psychiatric/Behavioral: Negative.   All other systems reviewed and are negative.     Objective:   Physical Exam Constitutional: No distress . Vital  signs reviewed. HENT: Normocephalic. Atraumatic. Eyes: EOMI. No discharge. Cardiovascular: No JVD. Respiratory: Normal  effort. GI: Non-distended. Musc: Right hip TTP. Neurologic: Alert and oriented x3 Motor:  Right upper extremity: 5/5 throughout Left upper extremity: Shoulder abduction 3/5, distally 4+/5 with apraxia Left lower extremity: 4+/5 proximal to distal Right lower extremity: 2/5 hip flexion (pain inhibition), knee extension, ankle dorsiflexion, (variable with participation) Skin: Warm and dry.  Intact.    Assessment & Plan:  Right-handed female with past medical history of right SAH from a fall in May 2018, hypothyroidism, tobacco abuse,  back surgery presents for hospital follow up after receiving CIR for right corona radiata infarction   1.  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    Cont therapies at SNF  Follow up with Neuro, VIR  Labs/Vitals reviewed from SNF  2. Dysphagia  Appears to have resolved  Cont SLP   3.  [facility].   Patient seen in the past by Dr. Conchita ParisNundkumar, follow up if necessary  4. Hypertension.  Cont meds  Relatively controlled today  5.  Acute blood loss anemia   Follow up with GI bleed, recent EGD, on carafate  6. Seizures  Cont meds  Follow up with Neurology  7.  Hip OA  Follow up with ortho  8.  Emphysema with scattered pulmonary nodules             Repeat chest CT as outpatient             Follow up with Pulm  Meds reviewed Referrals reviewed All questions answered  > 25 minutes spent with patient and daughter with >20 minutes in counseling regarding stroke, prognosis, hip OA, seizures

## 2019-05-07 ENCOUNTER — Telehealth: Payer: Self-pay

## 2019-05-07 NOTE — Telephone Encounter (Signed)
Pt's daughter Juliann Pulse ok per dpr) called in to f/u on 05/01/19 video visit with Dr. Leonie Man. Pt currently lives at [facility] in [place] and facility helped with video visit.  In the note from 05/01/19 Dr. Leonie Man made mention in he would f/u with pt's daughter and reached out to [facility] and lvm. Juliann Pulse is asking if Dr. Karn Cassis could call her back at # [phone] to discuss cognitive function.  I advised daughter this was Dr. Clydene Fake week in the hospital and verbalized understanding.

## 2019-05-07 NOTE — Telephone Encounter (Signed)
I called Connie Oconnor pts daughter that message will be sent to Dr.SEthi about her moms video visit with facility. Connie Oconnor stated she is the first contact for any appts, meds or issues. I stated provider works in the hospital every other week. She verbalized understanding and requested he call her home phone.

## 2019-05-14 NOTE — Telephone Encounter (Signed)
I spoke with Dr.SEthi and he wants to see pt in late August or September. Pt is currently in a nursing home facility.

## 2019-05-14 NOTE — Telephone Encounter (Signed)
I spoke to the patient's daughter and gave her an update on my via consultation visit.  She voiced concern about the patient being still on aspirin and taking an iron and advised her to discuss this with patient's medical doctor at the nursing home.  She still had some cognitive impairment since her seizure and stroke and I recommended an in person the office follow-up visit to evaluate her further for this.  From the stroke standpoint she could stay on Plavix alone and does not need aspirin.  She voiced understanding.

## 2019-05-21 ENCOUNTER — Other Ambulatory Visit: Payer: Self-pay

## 2019-05-21 NOTE — Telephone Encounter (Signed)
I spoke with Juliann Pulse at [facility]. I stated Dr.Sethi wants to see pt for in office visit in August or September. I schedule pt with Dr.SEthi for June 26, 2019 Tuesday at 1100am, and check in at 1030am. I stated only one staff person and family member are allowed in waiting room and exam room. I stated pt, staff and family member have to wear a mask. Juliann Pulse verbalized understanding and will notify family.

## 2019-06-01 ENCOUNTER — Encounter: Payer: Self-pay | Admitting: Gastroenterology

## 2019-06-01 ENCOUNTER — Ambulatory Visit (INDEPENDENT_AMBULATORY_CARE_PROVIDER_SITE_OTHER): Payer: Medicare Other | Admitting: Gastroenterology

## 2019-06-01 DIAGNOSIS — K922 Gastrointestinal hemorrhage, unspecified: Secondary | ICD-10-CM

## 2019-06-01 DIAGNOSIS — K259 Gastric ulcer, unspecified as acute or chronic, without hemorrhage or perforation: Secondary | ICD-10-CM

## 2019-06-01 NOTE — Progress Notes (Signed)
TELEHEALTH VISIT  Referring Provider: Danie BinderNelson, Kristen M, PA-C Primary Care Physician:  Heide ScalesNelson, Kristen M, PA-C   Tele-visit due to COVID-19 pandemic Patient requested visit virtually, consented to the virtual encounter via video enabled telemedicine application (telephone) Contact made at: 14:00 06/01/19 Patient verified by name and date of birth Location of patient: [facility]  Location provider: [facility] office Names of persons participating: Me, patient, Deneise LeverDesiree Brown CMA Time spent on telehealth visit: 22 minutes I discussed the limitations of evaluation and management by telemedicine. The patient expressed understanding and agreed to proceed.  Chief complaint:  GI blood loss anemia   IMPRESSION:  Progressive anemia and heme positive stools Gastric ulcer on EGD 03/22/19 LA Class B reflux esophagitis on EGD 03/22/19 Small hiatal hernia without Cameron's erosions CVA in April, on Plavix and ASA New onset seizures late April, on Keppra  GI bleeding in May is most likely related to concurrent reflux esophagitis and a known gastric ulcer in the setting of ongoing Plavix and aspirin use.  Hemoglobin appears to be stable despite ongoing treatment with Plavix and ASA. No additional overt bleeding.  Will continue PPI while on Plavix. No endoscopy planned at this time given her comorbidities and ongoing Plavix. Would repeat EGD with any additional concerns for bleeding to monitor for response to treatment and biopsy for H pylori.      PLAN: Consider H pylori testing in the future if it can be performed off proton pump inhibitor therapy No endoscopy planned at this time. She should have serial hgb/hct for monitoring over time. Please contact me if her hemoglobin further declines. No formal follow-up arrange with [facility], but, I am available with any additional questions or concerns.       HPI: Connie MarusRebecca Stan is a 78 y.o. female seen by me during her hospitalization in  May and rehospitalization in June for GI blood loss anemia.  I have reviewed her prior and interval history with the patient, her electronic health record, and [facility].  She has a history of right SAH from a fall in May 2018, hypothyroidism, tobacco abuse, and back surgery.  She was hospitalized in April 2020 due to acute left-sided weakness and an MRI revealed a thrombotic stroke.  She remained on aspirin plan and Plavix.  During that hospitalization she had acute blood loss anemia with positive Hemoccult. EGD in May for progressive Hemoccult positive stool anemia showed LA class B reflux esophagitis, a hiatal hernia without Cameron's ulcers and an H. pylori negative gastric ulcer.  She was treated with PPI twice daily x8 weeks and Carafate 1 g 4 times daily for 2 weeks.  In June she was found to have symptomatic, progressive anemia with weakness and fatigue and a hemoglobin of 6.9.  We had recommended an EGD but the patient's daughters declined endoscopic evaluation unless there was overt bleeding.  Hemoglobin on discharge was 7.5.  She resumed Plavix 04/08/2019.  Aspirin was discontinued. She was hospitalized in late June for new onset seizures and started on Keppra and continued on Plavix.    [facility]  The interval history is obtained through the patient and review of her electronic health record.     Hemoglobin 03/26/2019: 8.4 Hemoglobin 04/04/2019: 6.9 Hemoglobin 04/05/2019: 8.2 Hemoglobin 04/06/2019: 8.1 Hemoglobin 04/07/2019: 8.6 Hemoglobin 04/08/2019: 7.7 Hemoglobin 04/09/2019: 7.5 Hemoglobin 04/24/19: 8.4 Hemoglobin 04/24/19: 7.7 Hemoglobin 05/14/19: 8.1 ([facility]) Hemoglobin 05/24/19: 8.8 (per [facility])  She denies any ongoing GI complaints. There is no dysphagia, odynophagia, regurgitation,  heartburn, nausea, abdominal pain, change in bowel habits, melena, hematochezia, or bright red blood per rectum. Poor  appetite. Possible weight loss. Her nurse reports on change in bowel habits or noted overt bleeding.    She had a normal colonoscopy in 2009.  Past Medical History:  Diagnosis Date  . Anemia    hx of  . Arthritis   . Dislocation of right knee with medial meniscus tear   . Headache    hx of migraines none since 1990's  . Hypothyroidism    1978, no problems since  . Pneumonia 1990's none since  . PVC (premature ventricular contraction)    199'0's none since  . SAH (subarachnoid hemorrhage) ([facility]) 2016   On the right  . Stroke [facility])     Past Surgical History:  Procedure Laterality Date  . BACK SURGERY    . CERVICAL LAMINECTOMY  1986  . DILATION AND CURETTAGE OF UTERUS  1970  . ELBOW SURGERY Right 1993  . ESOPHAGOGASTRODUODENOSCOPY N/A 03/20/2019   Procedure: ESOPHAGOGASTRODUODENOSCOPY (EGD);  Surgeon: Thornton Park, MD;  Location: [place];  Service: Gastroenterology;  Laterality: N/A;  . IR ANGIO INTRA EXTRACRAN SEL COM CAROTID INNOMINATE BILAT MOD SED  02/22/2019  . IR ANGIO VERTEBRAL SEL SUBCLAVIAN INNOMINATE UNI L MOD SED  02/22/2019  . IR ANGIO VERTEBRAL SEL VERTEBRAL UNI R MOD SED  02/22/2019  . KNEE ARTHROSCOPY WITH SUBCHONDROPLASTY Right 01/11/2018   Procedure: KNEE ARTHROSCOPY WITH SUBCHONDROPLASTY, PARTIAL MEDIAL MENISCECTOMY, CHONDROPLASTY OF THE MEDIAL PATELLA FEMORAL CHONDYL, MEDIAL PLICA  EXCISION;  Surgeon: Dorna Leitz, MD;  Location: [facility];  Service: Orthopedics;  Laterality: Right;  . LUMBAR LAMINECTOMY  1993   rods placed bone generated removed one year later   . MULTIPLE TOOTH EXTRACTIONS    . SHOULDER SURGERY Left    arthritis clean out and shortened collar bone dr Eddie Dibbles  . TONSILLECTOMY  1970  . VAGINAL HYSTERECTOMY  1971   partial dr Mallie Mussel  . WISDOM TOOTH EXTRACTION      Current Outpatient Medications  Medication Sig Dispense Refill  . acetaminophen (TYLENOL) 325 MG tablet Take 2 tablets (650 mg total) by mouth every 4 (four) hours as needed for  mild pain (or temp > 37.5 C (99.5 F)). (Patient taking differently: Take 650 mg by mouth See admin instructions. Take 650 mg by mouth three times a day and 650 mg every four hours as needed for mild pain or temp > 99.5 F- NOT TO EXCEED 4,000 MG IN HOURS)    . atorvastatin (LIPITOR) 40 MG tablet Take 1 tablet (40 mg total) by mouth daily at 6 PM. 30 tablet 0  . clopidogrel (PLAVIX) 75 MG tablet Take 1 tablet (75 mg total) by mouth daily. 30 tablet 0  . diclofenac sodium (VOLTAREN) 1 % GEL Apply 2 g topically 4 (four) times daily. (Patient taking differently: Apply 2 g topically See admin instructions. Apply 2 grams to right hip four times a day)    . docusate sodium (COLACE) 100 MG capsule Take 100 mg by mouth daily as needed for mild constipation.     . ferrous sulfate 325 (65 FE) MG EC tablet Take 325 mg by mouth daily with breakfast.    . gabapentin (NEURONTIN) 100 MG capsule Take 1 capsule (100 mg total) by mouth 2 (two) times daily. 60 capsule 1  . losartan (COZAAR) 25 MG tablet Take 1 tablet (25 mg total)  by mouth daily.    . magnesium oxide (MAG-OX) 400 MG tablet Take 400 mg by mouth 2 (two) times daily.     . Melatonin 3 MG TABS Take 6 mg by mouth at bedtime.    . Multiple Vitamins-Minerals (MULTIVITAMINS THER. W/MINERALS) TABS tablet Take 1 tablet by mouth daily with breakfast.    . polyethylene glycol (MIRALAX / GLYCOLAX) 17 g packet Take 17 g by mouth 2 (two) times daily. (Patient taking differently: Take 17 g by mouth 2 (two) times daily as needed for mild constipation (and hold for loose stools). ) 14 each 0  . senna-docusate (SENNA-PLUS) 8.6-50 MG tablet Take 2 tablets by mouth daily as needed for mild constipation.     . sertraline (ZOLOFT) 25 MG tablet Take 25 mg by mouth daily.     No current facility-administered medications for this visit.     Allergies as of 06/01/2019 - Review Complete 06/01/2019  Allergen Reaction Noted  . Lactose intolerance (gi) Diarrhea 03/20/2019  .  Aspirin Other (See Comments) 09/24/2015  . Ensure [nutritional supplements] Diarrhea 04/24/2019  . Ibuprofen Other (See Comments) 03/15/2018  . Penicillins Rash and Other (See Comments) 09/24/2015    Family History  Problem Relation Age of Onset  . Stroke Mother   . Heart attack Father     Social History   Socioeconomic History  . Marital status: Widowed    Spouse name: Not on file  . Number of children: Not on file  . Years of education: Not on file  . Highest education level: Not on file  Occupational History  . Occupation: retired  Engineer, productionocial Needs  . Financial resource strain: Not on file  . Food insecurity    Worry: Not on file    Inability: Not on file  . Transportation needs    Medical: Not on file    Non-medical: Not on file  Tobacco Use  . Smoking status: Former Smoker    Packs/day: 1.00    Years: 72.00    Pack years: 72.00    Types: Cigarettes  . Smokeless tobacco: Never Used  . Tobacco comment: stopped in January 2020  Substance and Sexual Activity  . Alcohol use: No  . Drug use: No  . Sexual activity: Not on file  Lifestyle  . Physical activity    Days per week: Not on file    Minutes per session: Not on file  . Stress: Not on file  Relationships  . Social Musicianconnections    Talks on phone: Not on file    Gets together: Not on file    Attends religious service: Not on file    Active member of club or organization: Not on file    Attends meetings of clubs or organizations: Not on file    Relationship status: Not on file  . Intimate partner violence    Fear of current or ex partner: Not on file    Emotionally abused: Not on file    Physically abused: Not on file    Forced sexual activity: Not on file  Other Topics Concern  . Not on file  Social History Narrative  . Not on file    Review of Systems: ALL ROS discussed and all others negative except listed in HPI.  Physical Exam: Complete physical exam not performed due to the limits inherent in a  telehealth encounter.  General: Awake, alert, and oriented, and well communicative. In no acute distress.  HEENT: EOMI, non-icteric sclera, NCAT, MMM  Neck: Normal movement of head and neck  Pulm: No labored breathing, speaking in full sentences without conversational dyspnea  Derm: No apparent lesions or bruising in visible field  MS: Moves all visible extremities without noticeable abnormality  Psych: Pleasant, cooperative, normal speech, normal affect and normal insight Neuro: Alert and appropriate   Jaleigh Mccroskey L. Orvan FalconerBeavers, MD, MPH [facility] 06/01/2019, 2:22 PM

## 2019-06-13 ENCOUNTER — Encounter: Payer: Medicare Other | Attending: Physical Medicine & Rehabilitation | Admitting: Physical Medicine & Rehabilitation

## 2019-06-13 ENCOUNTER — Encounter: Payer: Self-pay | Admitting: Physical Medicine & Rehabilitation

## 2019-06-13 ENCOUNTER — Other Ambulatory Visit: Payer: Self-pay

## 2019-06-13 VITALS — BP 124/75 | HR 85 | Temp 97.6°F | Ht 65.0 in | Wt 141.0 lb

## 2019-06-13 DIAGNOSIS — R569 Unspecified convulsions: Secondary | ICD-10-CM | POA: Diagnosis present

## 2019-06-13 DIAGNOSIS — F0150 Vascular dementia without behavioral disturbance: Secondary | ICD-10-CM | POA: Insufficient documentation

## 2019-06-13 DIAGNOSIS — K922 Gastrointestinal hemorrhage, unspecified: Secondary | ICD-10-CM | POA: Insufficient documentation

## 2019-06-13 DIAGNOSIS — Z87820 Personal history of traumatic brain injury: Secondary | ICD-10-CM

## 2019-06-13 DIAGNOSIS — J439 Emphysema, unspecified: Secondary | ICD-10-CM | POA: Diagnosis present

## 2019-06-13 DIAGNOSIS — M1611 Unilateral primary osteoarthritis, right hip: Secondary | ICD-10-CM | POA: Diagnosis present

## 2019-06-13 DIAGNOSIS — R269 Unspecified abnormalities of gait and mobility: Secondary | ICD-10-CM

## 2019-06-13 DIAGNOSIS — I69359 Hemiplegia and hemiparesis following cerebral infarction affecting unspecified side: Secondary | ICD-10-CM | POA: Diagnosis not present

## 2019-06-13 DIAGNOSIS — D62 Acute posthemorrhagic anemia: Secondary | ICD-10-CM

## 2019-06-13 NOTE — Progress Notes (Signed)
Subjective:    Patient ID: Connie Oconnor, female    DOB: 14-Apr-1941, 78 y.o.   MRN: [id number]  HPI Right-handed female with past medical history of right SAH from a fall in May 2018, hypothyroidism, tobacco abuse, back surgery presents for hospital follow up after receiving CIR for right corona radiata infarction   Last clinic visit 05/02/2019.  Patient seen and examined with resident physician.  Daughter provides history. She is now at home with daughters from the SNF. Therapies to start soon. She saw VIR and was told to follow up again if she is able to gain more strength/weight.  She followed up with Neuro. Dysphagia issues have resolved. She had a right hip injection ~1 month ago. She has not followed up.  Denies seizures.  Pain Inventory Average Pain 5 Pain Right Now 8 My pain is constant, sharp, dull, stabbing, tingling and aching  In the last 24 hours, has pain interfered with the following? General activity 9 Relation with others 5 Enjoyment of life 10 What TIME of day is your pain at its worst? varies Sleep (in general) Good  Pain is worse with: unsure Pain improves with: medication Relief from Meds: 9  Mobility how many minutes can you walk? 0 ability to climb steps?  no do you drive?  no use a wheelchair needs help with transfers  Function retired I need assistance with the following:  dressing, bathing and toileting  Neuro/Psych bladder control problems bowel control problems weakness trouble walking depression anxiety  Prior Studies Any changes since last visit?  no  Physicians involved in your care Primary care Dr. Joycelyn RuaStephen Meyers Neurologist Dr. Pearlean BrownieSethi Dr. Titus Dubinevashwar   Family History  Problem Relation Age of Onset   Stroke Mother    Heart attack Father    Social History   Socioeconomic History   Marital status: Widowed    Spouse name: Not on file   Number of children: Not on file   Years of education: Not on file   Highest  education level: Not on file  Occupational History   Occupation: retired  Ecologistocial Needs   Financial resource strain: Not on file   Food insecurity    Worry: Not on file    Inability: Not on Occupational hygienistfile   Transportation needs    Medical: Not on file    Non-medical: Not on file  Tobacco Use   Smoking status: Former Smoker    Packs/day: 1.00    Years: 72.00    Pack years: 72.00    Types: Cigarettes   Smokeless tobacco: Never Used   Tobacco comment: stopped in January 2020  Substance and Sexual Activity   Alcohol use: No   Drug use: No   Sexual activity: Not on file  Lifestyle   Physical activity    Days per week: Not on file    Minutes per session: Not on file   Stress: Not on file  Relationships   Social connections    Talks on phone: Not on file    Gets together: Not on file    Attends religious service: Not on file    Active member of club or organization: Not on file    Attends meetings of clubs or organizations: Not on file    Relationship status: Not on file  Other Topics Concern   Not on file  Social History Narrative   Not on file   Past Surgical History:  Procedure Laterality Date   BACK SURGERY  CERVICAL LAMINECTOMY  1986   DILATION AND CURETTAGE OF UTERUS  1970   ELBOW SURGERY Right 1993   ESOPHAGOGASTRODUODENOSCOPY N/A 03/20/2019   Procedure: ESOPHAGOGASTRODUODENOSCOPY (EGD);  Surgeon: Thornton Park, MD;  Location: [facility];  Service: Gastroenterology;  Laterality: N/A;   IR ANGIO INTRA EXTRACRAN SEL COM CAROTID INNOMINATE BILAT MOD SED  02/22/2019   IR ANGIO VERTEBRAL SEL SUBCLAVIAN INNOMINATE UNI L MOD SED  02/22/2019   IR ANGIO VERTEBRAL SEL VERTEBRAL UNI R MOD SED  02/22/2019   KNEE ARTHROSCOPY WITH SUBCHONDROPLASTY Right 01/11/2018   Procedure: KNEE ARTHROSCOPY WITH SUBCHONDROPLASTY, PARTIAL MEDIAL MENISCECTOMY, CHONDROPLASTY OF THE MEDIAL PATELLA FEMORAL CHONDYL, MEDIAL PLICA  EXCISION;  Surgeon: Dorna Leitz, MD;  Location: [facility];  Service: Orthopedics;  Laterality: Right;   LUMBAR LAMINECTOMY  1993   rods placed bone generated removed one year later    MULTIPLE TOOTH EXTRACTIONS     SHOULDER SURGERY Left    arthritis clean out and shortened collar bone dr Eddie Dibbles   TONSILLECTOMY  1970   VAGINAL HYSTERECTOMY  1971   partial dr Hansel Feinstein TOOTH EXTRACTION     Past Medical History:  Diagnosis Date   Anemia    hx of   Arthritis    Dislocation of right knee with medial meniscus tear    Headache    hx of migraines none since 1990's   Hypothyroidism    1978, no problems since   Pneumonia 1990's none since   PVC (premature ventricular contraction)    199'0's none since   [facility] (subarachnoid hemorrhage) ([place]) 2016   On the right   Stroke ([place])    BP 124/75    Pulse 85    Temp 97.6 F (36.4 C)    Ht 5\' 5"  (1.651 m)    Wt 141 lb (64 kg)    SpO2 95%    BMI 23.46 kg/m   Opioid Risk Score:   Fall Risk Score:  `1  Depression screen PHQ 2/9  Depression screen PHQ 2/9 05/02/2019  Decreased Interest 1  Down, Depressed, Hopeless 1  PHQ - 2 Score 2  Altered sleeping 1  Change in appetite 1  Feeling bad or failure about yourself  1  Trouble concentrating 0  Moving slowly or fidgety/restless 1  Suicidal thoughts 1  PHQ-9 Score 7  Difficult doing work/chores Not difficult at all    Review of Systems  Constitutional: Positive for appetite change and unexpected weight change.  HENT: Negative.   Eyes: Negative.  Negative for visual disturbance.  Gastrointestinal: Positive for abdominal pain, constipation and nausea.  Endocrine: Negative.   Genitourinary: Negative.   Musculoskeletal: Positive for arthralgias, back pain, gait problem and myalgias.  Allergic/Immunologic: Negative.   Hematological: Negative.   Psychiatric/Behavioral: Negative.       Objective:   Physical Exam Constitutional: No distress . Vital signs reviewed. HENT: Normocephalic.  Atraumatic. Eyes: EOMI. No  discharge. Cardiovascular: No JVD. Respiratory: Normal effort.  No stridor. GI: Non-distended. Skin: Warm and dry.  Intact. Psych: Flat Musc: Right hip TTP. Pain with minimal ROM of RLE Neurologic: Alert and oriented except for date of month Motor:  Right upper extremity: 5/5 throughout Left upper extremity: Shoulder abduction 2/5, distally 2/5 with apraxia Left lower extremity: 2-/5 proximal to distal Right lower extremity: 2/5 hip flexion (pain inhibition), knee extension, ankle dorsiflexion, (variable with participation)    Assessment & Plan:  Right-handed female with past medical history of right SAH from a fall in  May 2018, hypothyroidism, tobacco abuse, back surgery presents for hospital follow up after receiving CIR for right corona radiata infarction   1.  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    Cont HH therapies  Cont follow up with Neuro, VIR (waiting improvement in strength)  2.  [facility]   Patient seen in the past by Dr. Conchita ParisNundkumar, follow up if necessary  3.  Acute blood loss anemia   Cont to follow up with GI  4. Seizures  Cont meds per Neurology, encouraged follow up regarding Keppra and potential lethargy/disinterest.  5.  Right Hip +/- SI OA  ?SI joint left hip without benefit  Will order xray of hip and SI joint  6.  Emphysema with scattered pulmonary nodules             Repeat chest CT as outpatient             Follow up with Pulm, needs appointment  7. Gait abnormality  Cont wheelchair for safety  Cont therapies  >25 minutes spent with patient, daughter, and resident, with >20 minutes in counseling regarding stroke sequela, hip/back pain.

## 2019-06-13 NOTE — Progress Notes (Addendum)
CC: F/u L sided weakness secondary to stroke  HPI: History provided by daughter  [name] is a 78 y.o. 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  Her last clinic visit was 05/02/2019.  Since that time, the patient has been discharged home from SNF, since her insurance would no longer cover her stay. The patient now lives at home under the supervision of her daughters 24/7.  One daughter stays with her during the week while the other one comes during the weekend.  Patient has home health services. Nursing and PT have their first session this evening.  Since her last visit, the patient's strength has been diminished. The daughter says she has been in the hospital twice over the last couple months for a GI bleed and pneumonia and seizures for which she was started on Keppra. She says any progress they made was undone by these hospitalizations. The patient says she has increased pain in her hips. The patient did receive steroid injections 1 month ago at an outside orthopedic group. At first the patient says the injections were in her hip, however upon further questioning it sounds like she may have gotten the injections in her SI joints.  In any case, the injections provided no relief.  The patient and daughter expressed a desire to consolidate all of her care here at [facility] so that all of her records will be available.  Currently, the patient is unable to walk and uses a wheelchair for all ambulation.  She needs full assistance with dressing, bathing and toileting.  She denies having changes in her bowel or bladder habits.  Past Medical History:  Diagnosis Date  . Anemia    hx of  . Arthritis   . Dislocation of right knee with medial meniscus tear   . Headache    hx of migraines none since 1990's  . Hypothyroidism    1978, no problems since  . Pneumonia 1990's none since  . PVC (premature ventricular contraction)    199'0's none since  . SAH (subarachnoid hemorrhage) ([place]) 2016   On the right  . Stroke [facility])    Review of Systems: All systems reviewed and are otherwise negative unless mentioned in the HPI.  Physical Exam:  Vitals:   06/13/19 1110  BP: 124/75  Pulse: 85  Temp: 97.6 F (36.4 C)  SpO2: 95%  Weight: 64 kg  Height: 5\' 5"  (1.651 m)   Physical Exam Vitals signs reviewed.  Constitutional:      General: She is not in acute distress.    Appearance: She is ill-appearing. She is not toxic-appearing or diaphoretic.  HENT:     Head: Normocephalic and atraumatic.  Eyes:     General: No scleral icterus.       Right eye: No discharge.        Left eye: No discharge.     Extraocular Movements: Extraocular movements intact.  Cardiovascular:     Comments: Warm and well perfused Pulmonary:     Effort: Pulmonary effort is normal.  Abdominal:     General: Abdomen is flat.     Palpations: Abdomen is soft.  Musculoskeletal:     Right lower leg: No edema.     Left lower leg: No edema.     Comments: LUE - 5/5 with  flexion and extension of the shoulder and elbow.  1+, 2 spasticity present RUE- 5/5 with flexion and extension of the shoulder and elbow LLE-0/5 strength secondary to pain, pain with passive motion in the hip RLE- 0/5 strength secondary to pain, pain with passive motion in the hip  Skin:    General: Skin is warm.  Neurological:     Mental Status: She is alert.     Motor: Weakness present.     Comments: Sensation intact to bilateral upper and lower extremities  Psychiatric:        Mood and Affect: Mood normal.     Assessment & Plan:   In summary, [name] is a 78 y.o. 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  Today, the patient appears to be slightly weaker than her previous visit.  This may be due to deconditioning associated with recent hospitalizations. The bilateral hip pain is concerning for potential fractures.  #L sided weakness secondary to R corona radiata infarction w/ L hemiparesis & dysarthria: Patient continues to have significant weakness on the left side and has not been able to make much progress due to her deconditioning associated with recent hospitalizations.  She will begin home health PT starting this evening. -Continue home health therapies -Continue to follow-up with neurology as needed  #Bilateral hip pain: The patient's pain with passive motion in bilateral hips appears to be out of proportion to exam and is concerning.  The patient's daughter says that she has received steroid injections with Ortho, however we have no records to view.  It sounds like she has received SI joint injections from them before, however we cannot be certain.  The patient and daughter [facility], so we will obtain additional imaging at this time in an effort to guide our interventions in the future. -Right hip x-rays -Right SI joint x-rays -Continue Baclofen 10 mg 3 times daily.  Monitor for sedation/cramping. Will reassess at the next visit.  #Seizures -Continue Keppra 250 mg daily -Encouraged patient and daughter to discuss potential side effects of Keppra with neurology  #Pulmonary nodules -Seen on previous CT, F/2 with pulmonology  #Follow up  - 4 weeks   Patient seen with Dr. Allena KatzPatel

## 2019-06-26 ENCOUNTER — Ambulatory Visit: Payer: Self-pay | Admitting: Neurology

## 2019-07-04 ENCOUNTER — Other Ambulatory Visit: Payer: Self-pay

## 2019-07-04 ENCOUNTER — Ambulatory Visit (HOSPITAL_COMMUNITY)
Admission: RE | Admit: 2019-07-04 | Discharge: 2019-07-04 | Disposition: A | Discharge status: Home / Self Care | Payer: Medicare Other | Source: Ambulatory Visit | Attending: Physical Medicine & Rehabilitation | Admitting: Physical Medicine & Rehabilitation

## 2019-07-04 ENCOUNTER — Other Ambulatory Visit: Payer: Self-pay | Admitting: Physical Medicine & Rehabilitation

## 2019-07-04 ENCOUNTER — Ambulatory Visit (HOSPITAL_COMMUNITY): Payer: Medicare Other

## 2019-07-04 DIAGNOSIS — R569 Unspecified convulsions: Secondary | ICD-10-CM

## 2019-07-04 IMAGING — DX DG HIP (WITH OR WITHOUT PELVIS) 2-3V*R*
3 series · 3 of 3 positions shown · non-contrast
Comparison: None.

CLINICAL DATA: Pain, seizure, fall, right hip pain and lower back
pain.

EXAM:
DG HIP (WITH OR WITHOUT PELVIS) 2-3V RIGHT

[pelvis ap]
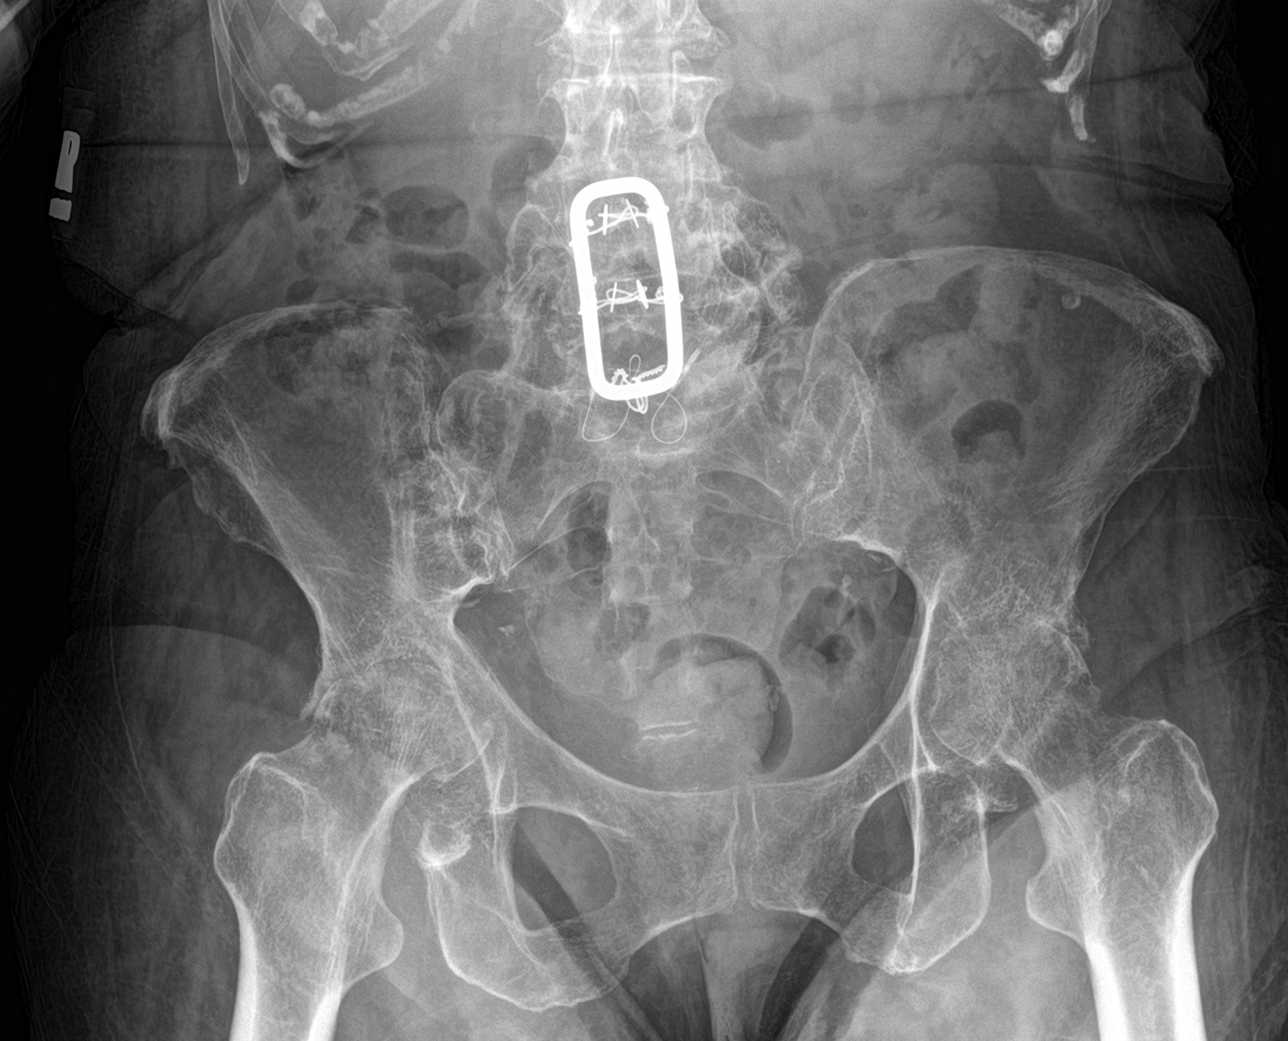

[hip ap]
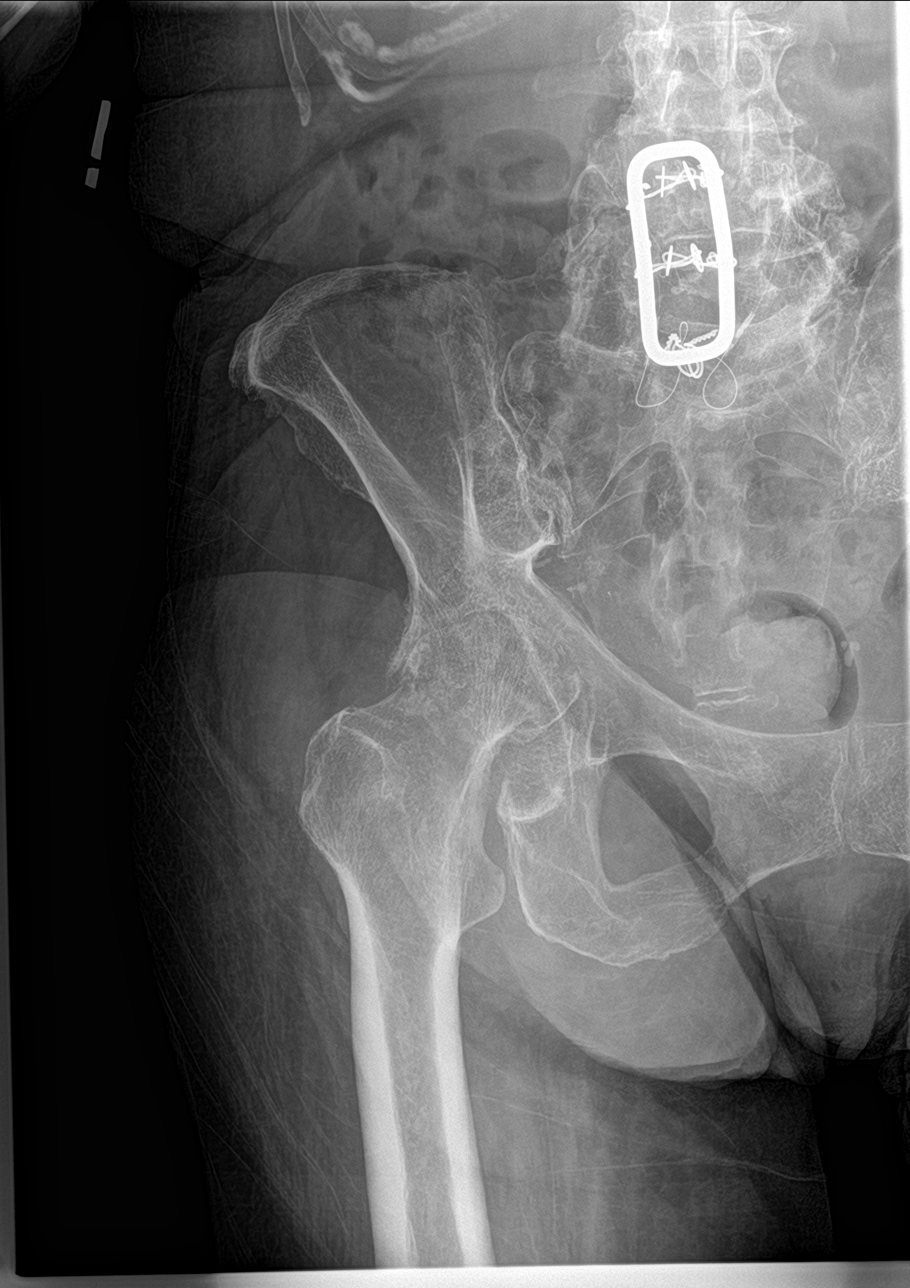

[hip lat]
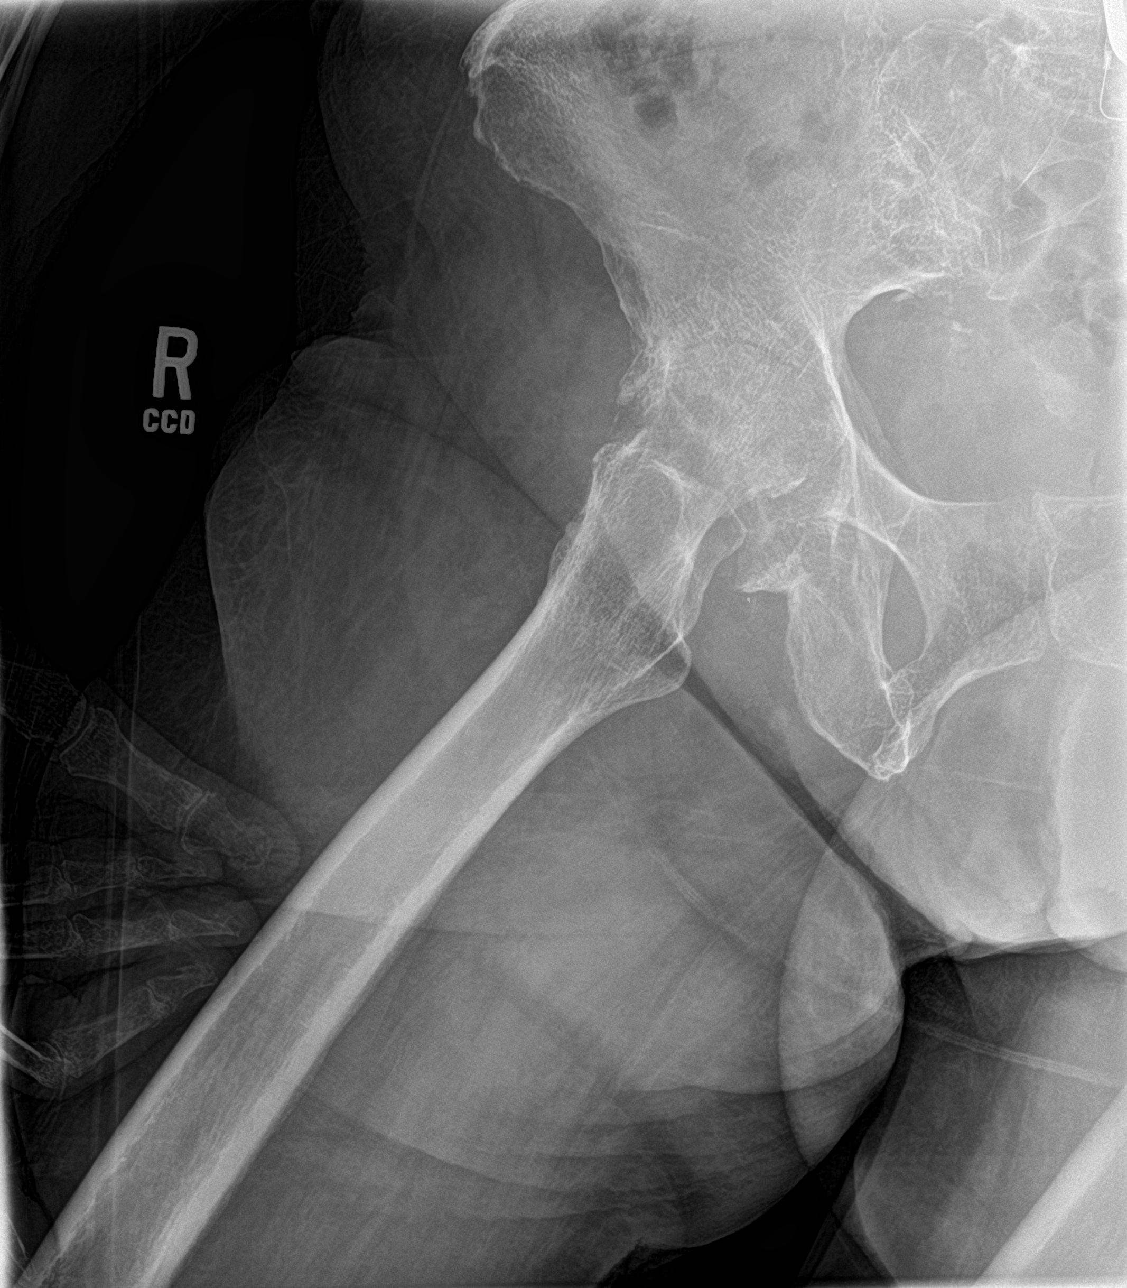

[3 of 3 positions shown; findings below may reference images not displayed]

FINDINGS: The osseous structures appear diffusely demineralized which may
limit detection of small or nondisplaced fractures. There is
posterior spinal fusion from L4-S1 with posterior cerclage wire and
cage construct. With extensive hypertrophic changes at this level
may be part of the bony fusion. Bilateral sacroiliac degenerative
changes are present. There is severe bilateral hip osteoarthrosis
including articular surface collapse of both femora, right greater
than left suggesting prior osteonecrosis. Furthermore, suspect a
possible nondisplaced transcervical left femoral neck fracture. No
left hip fracture is identified. Remaining bones of the pelvis
appear congruent. Atherosclerotic calcification is noted in the soft
tissues.
IMPRESSION: 1. Suspected nondisplaced transcervical left femoral neck fracture.
2. Articular surface collapse of the bilateral femoral heads, right
greater than left, may reflect sequela of prior osteonecrosis.
Severe bilateral hip osteoarthrosis is present.
3. Posterior spinal fusion from L4-S1 with extensive hypertrophic
changes at this level which may be part of the bony fusion.

These results will be called to the ordering clinician or
representative by the Radiologist Assistant, and communication
documented in the PACS or zVision Dashboard.

## 2019-07-04 IMAGING — DX DG SI JOINTS 3+V
2 series · 2 of 2 positions shown · non-contrast
Comparison: None.

CLINICAL DATA: Seizure and fall, right hip and lower back pain

EXAM:
BILATERAL SACROILIAC JOINTS - 3+ VIEW

[si joints obl (1 of 2)]
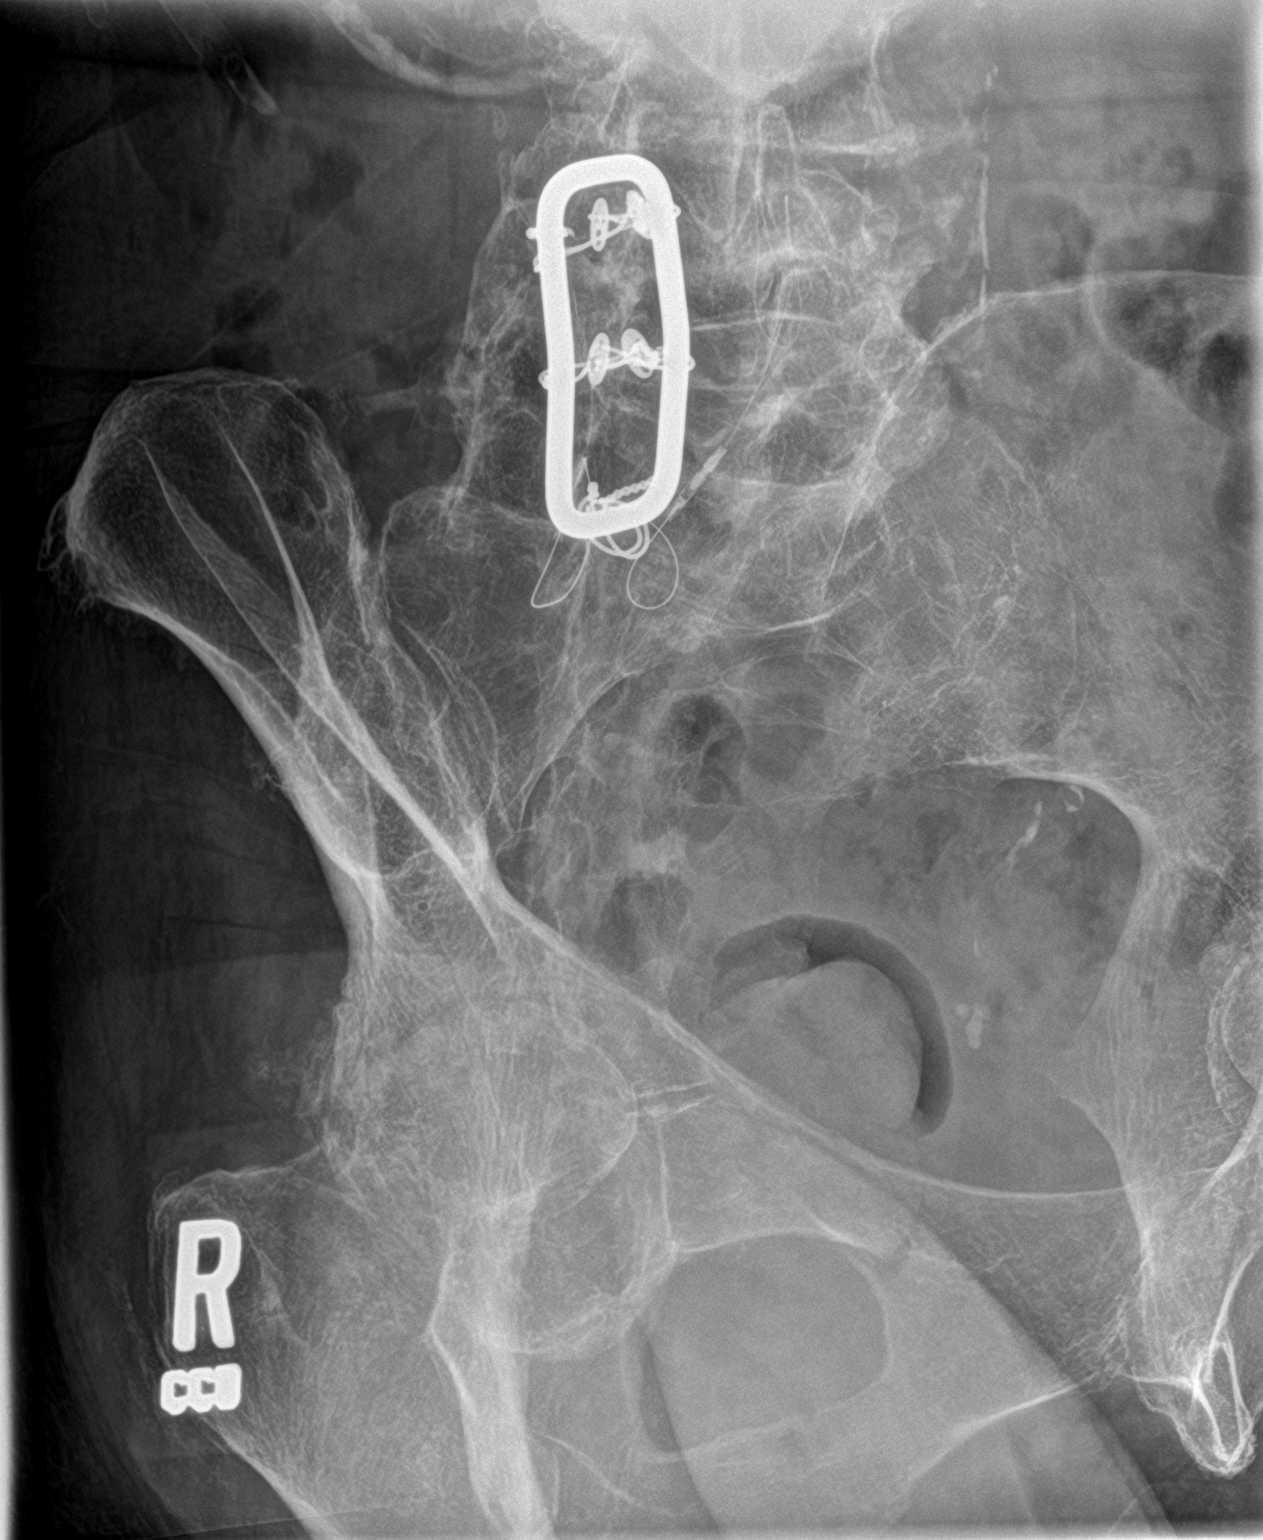

[si joints obl (2 of 2)]
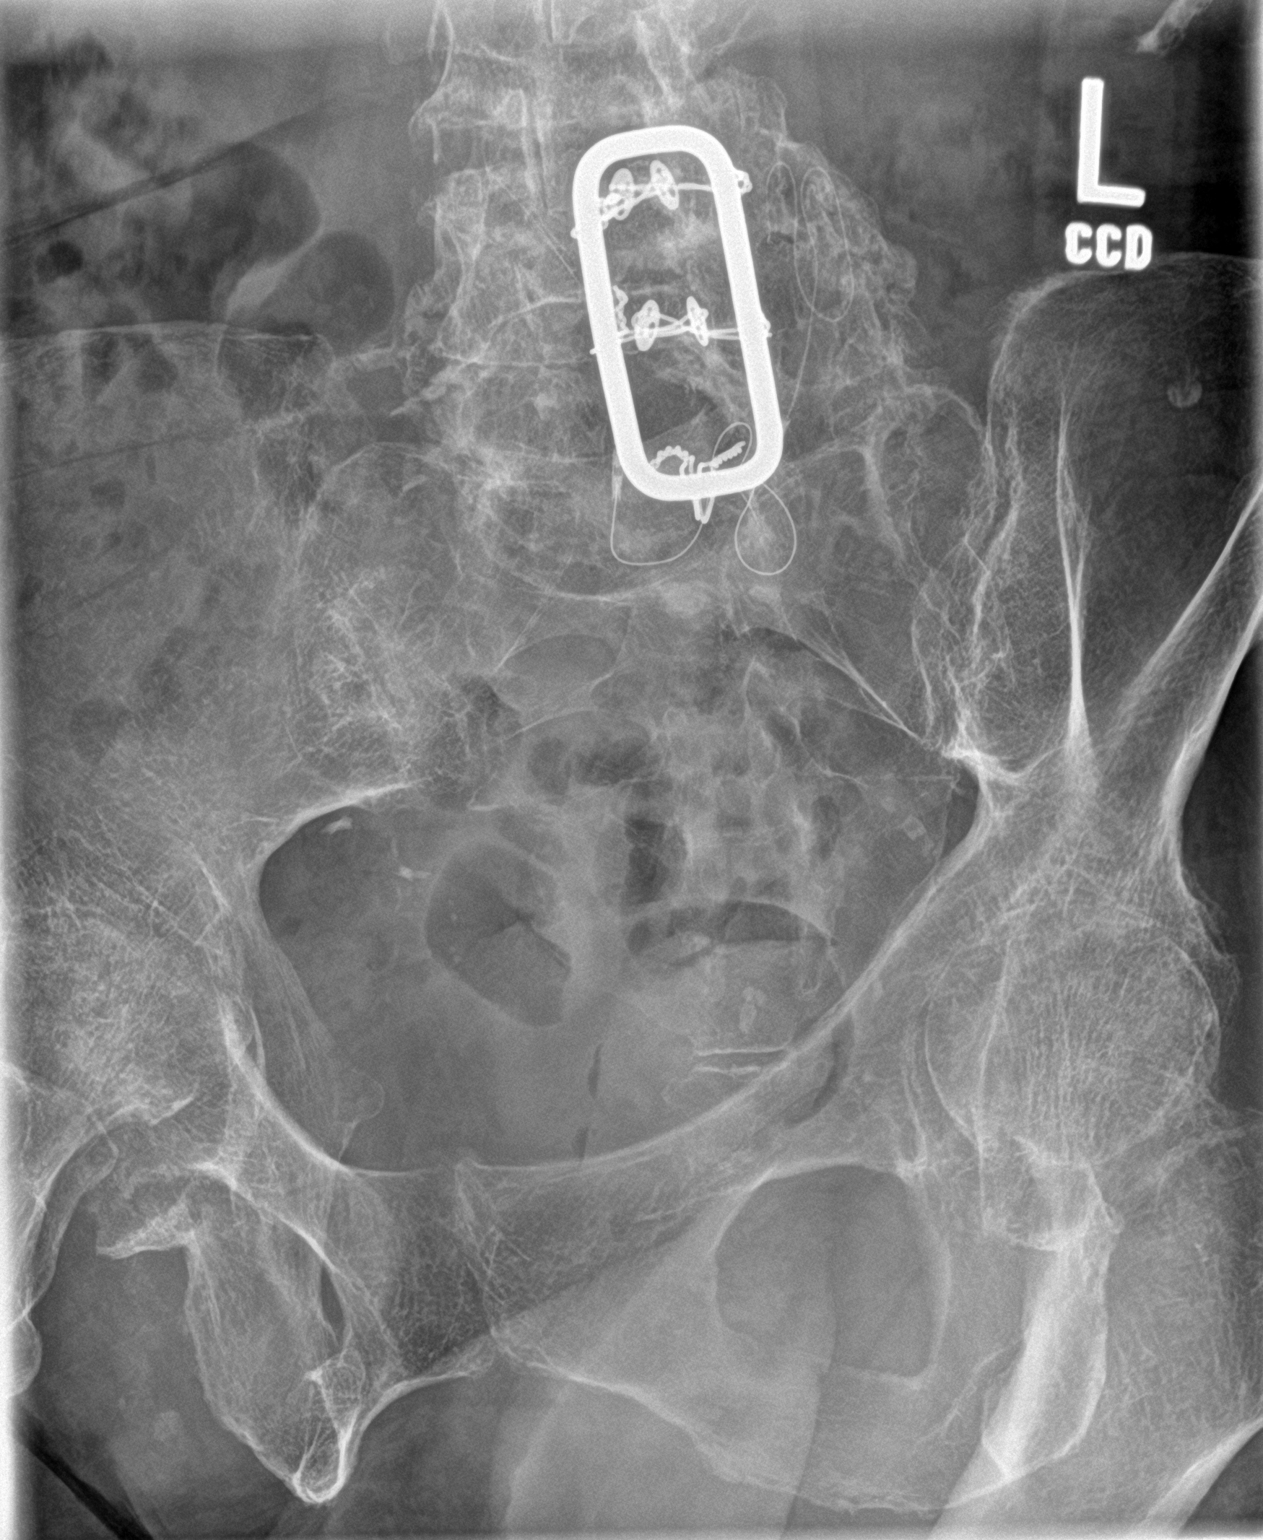

[2 of 2 positions shown; findings below may reference images not displayed]

FINDINGS: There is mild bilateral sacroiliac arthrosis without evidence of
clear bony fusion. No definite sacral fractures are identified
however the patient's osseous structures are diffusely demineralized
may limit detection of small, nondisplaced fractures. Arcuate lines
are maintained. Postsurgical changes from prior posterior spinal
fusion with cage in cerclage wire placement is noted. Vascular
calcium is present in the pelvis. The bowel gas pattern is normal.
Atherosclerotic plaque within the distal aorta.
IMPRESSION: Mild bilateral sacroiliac arthrosis. Radiography may have limited
sensitivity in the setting of bony demineralization. Consider
further evaluation cross-sectional imaging if there is persisting
clinical concern.

## 2019-07-10 ENCOUNTER — Encounter: Payer: Medicare Other | Admitting: Physical Medicine & Rehabilitation

## 2019-07-12 ENCOUNTER — Ambulatory Visit: Payer: Self-pay | Admitting: Neurology

## 2019-08-07 ENCOUNTER — Telehealth (HOSPITAL_COMMUNITY): Payer: Self-pay | Admitting: Radiology

## 2019-08-07 NOTE — Telephone Encounter (Signed)
Called pt's daughter to see how patient was doing and to see if a consult for follow-up and future treatment was appropriate. Per patient's daughter, Connie Oconnor has now been under Hospice care since 07/18/19. She is home and comfortable. The daughter states that they will not pursue any further follow-up or treatment for Connie Oconnor. I expressed to her how sorry I was to hear of Connie Oconnor condition. The daughter was very appreciative of that and my call to check in on them. JM

## 2019-08-21 ENCOUNTER — Ambulatory Visit: Payer: Self-pay | Admitting: Neurology

## 2019-08-22 ENCOUNTER — Encounter: Payer: Medicare Other | Admitting: Physical Medicine & Rehabilitation

## 2019-09-02 DEATH — deceased
